# Patient Record
Sex: Female | Born: 1935 | Race: White | Hispanic: No | Smoking: Current some day smoker
Health system: Southern US, Community
[De-identification: ages and names within clinical notes are randomized; demographics above are authoritative.]

## PROBLEM LIST (undated history)

## (undated) DIAGNOSIS — C189 Malignant neoplasm of colon, unspecified: Secondary | ICD-10-CM

## (undated) DIAGNOSIS — K635 Polyp of colon: Secondary | ICD-10-CM

## (undated) DIAGNOSIS — I639 Cerebral infarction, unspecified: Secondary | ICD-10-CM

## (undated) DIAGNOSIS — M549 Dorsalgia, unspecified: Secondary | ICD-10-CM

## (undated) DIAGNOSIS — I4891 Unspecified atrial fibrillation: Secondary | ICD-10-CM

## (undated) DIAGNOSIS — I509 Heart failure, unspecified: Secondary | ICD-10-CM

## (undated) DIAGNOSIS — K7460 Unspecified cirrhosis of liver: Secondary | ICD-10-CM

## (undated) DIAGNOSIS — I10 Essential (primary) hypertension: Secondary | ICD-10-CM

## (undated) DIAGNOSIS — K56609 Unspecified intestinal obstruction, unspecified as to partial versus complete obstruction: Secondary | ICD-10-CM

## (undated) DIAGNOSIS — C801 Malignant (primary) neoplasm, unspecified: Secondary | ICD-10-CM

## (undated) DIAGNOSIS — Z720 Tobacco use: Secondary | ICD-10-CM

## (undated) DIAGNOSIS — K922 Gastrointestinal hemorrhage, unspecified: Secondary | ICD-10-CM

## (undated) DIAGNOSIS — J449 Chronic obstructive pulmonary disease, unspecified: Secondary | ICD-10-CM

## (undated) DIAGNOSIS — K5900 Constipation, unspecified: Secondary | ICD-10-CM

## (undated) HISTORY — PX: COLOSTOMY: SHX63

---

## 1978-09-27 HISTORY — PX: TOTAL ABDOMINAL HYSTERECTOMY: SHX209

## 1998-07-15 ENCOUNTER — Ambulatory Visit (HOSPITAL_COMMUNITY): Admission: RE | Admit: 1998-07-15 | Discharge: 1998-07-15 | Payer: Self-pay | Admitting: Obstetrics & Gynecology

## 1998-10-23 ENCOUNTER — Other Ambulatory Visit: Admission: RE | Admit: 1998-10-23 | Discharge: 1998-10-23 | Payer: Self-pay | Admitting: Family Medicine

## 1999-01-12 ENCOUNTER — Ambulatory Visit (HOSPITAL_COMMUNITY): Admission: RE | Admit: 1999-01-12 | Discharge: 1999-01-12 | Payer: Self-pay | Admitting: Gastroenterology

## 2000-01-27 ENCOUNTER — Other Ambulatory Visit: Admission: RE | Admit: 2000-01-27 | Discharge: 2000-01-27 | Payer: Self-pay | Admitting: Family Medicine

## 2000-05-01 ENCOUNTER — Emergency Department (HOSPITAL_COMMUNITY): Admission: EM | Admit: 2000-05-01 | Discharge: 2000-05-01 | Payer: Self-pay | Admitting: Emergency Medicine

## 2000-05-01 ENCOUNTER — Encounter: Payer: Self-pay | Admitting: Emergency Medicine

## 2001-01-23 ENCOUNTER — Encounter: Admission: RE | Admit: 2001-01-23 | Discharge: 2001-01-23 | Payer: Self-pay | Admitting: Rheumatology

## 2001-01-23 ENCOUNTER — Encounter: Payer: Self-pay | Admitting: Rheumatology

## 2001-02-02 ENCOUNTER — Encounter: Admission: RE | Admit: 2001-02-02 | Discharge: 2001-05-03 | Payer: Self-pay | Admitting: Rheumatology

## 2001-12-11 ENCOUNTER — Encounter: Payer: Self-pay | Admitting: Family Medicine

## 2001-12-11 ENCOUNTER — Encounter: Admission: RE | Admit: 2001-12-11 | Discharge: 2001-12-11 | Payer: Self-pay | Admitting: Family Medicine

## 2002-05-14 ENCOUNTER — Encounter (INDEPENDENT_AMBULATORY_CARE_PROVIDER_SITE_OTHER): Payer: Self-pay | Admitting: *Deleted

## 2002-05-14 ENCOUNTER — Ambulatory Visit (HOSPITAL_COMMUNITY): Admission: RE | Admit: 2002-05-14 | Discharge: 2002-05-14 | Payer: Self-pay | Admitting: Gastroenterology

## 2004-03-11 ENCOUNTER — Encounter (HOSPITAL_BASED_OUTPATIENT_CLINIC_OR_DEPARTMENT_OTHER): Admission: RE | Admit: 2004-03-11 | Discharge: 2004-06-09 | Payer: Self-pay | Admitting: Internal Medicine

## 2006-03-20 ENCOUNTER — Ambulatory Visit: Payer: Self-pay | Admitting: Internal Medicine

## 2006-03-20 ENCOUNTER — Inpatient Hospital Stay (HOSPITAL_COMMUNITY): Admission: EM | Admit: 2006-03-20 | Discharge: 2006-04-04 | Payer: Self-pay | Admitting: Emergency Medicine

## 2006-03-21 ENCOUNTER — Encounter (INDEPENDENT_AMBULATORY_CARE_PROVIDER_SITE_OTHER): Payer: Self-pay | Admitting: Cardiology

## 2006-03-24 ENCOUNTER — Encounter (INDEPENDENT_AMBULATORY_CARE_PROVIDER_SITE_OTHER): Payer: Self-pay | Admitting: Specialist

## 2006-04-14 ENCOUNTER — Ambulatory Visit: Payer: Self-pay | Admitting: Internal Medicine

## 2006-05-04 ENCOUNTER — Inpatient Hospital Stay (HOSPITAL_COMMUNITY): Admission: EM | Admit: 2006-05-04 | Discharge: 2006-05-05 | Payer: Self-pay | Admitting: Emergency Medicine

## 2006-05-04 ENCOUNTER — Ambulatory Visit: Payer: Self-pay | Admitting: Internal Medicine

## 2006-07-15 ENCOUNTER — Encounter: Admission: RE | Admit: 2006-07-15 | Discharge: 2006-07-15 | Payer: Self-pay | Admitting: Family Medicine

## 2006-09-27 DIAGNOSIS — I639 Cerebral infarction, unspecified: Secondary | ICD-10-CM

## 2006-09-27 HISTORY — DX: Cerebral infarction, unspecified: I63.9

## 2006-10-10 ENCOUNTER — Inpatient Hospital Stay (HOSPITAL_COMMUNITY): Admission: EM | Admit: 2006-10-10 | Discharge: 2006-10-13 | Payer: Self-pay | Admitting: Emergency Medicine

## 2006-10-11 ENCOUNTER — Encounter (INDEPENDENT_AMBULATORY_CARE_PROVIDER_SITE_OTHER): Payer: Self-pay | Admitting: Cardiology

## 2006-10-11 ENCOUNTER — Encounter (INDEPENDENT_AMBULATORY_CARE_PROVIDER_SITE_OTHER): Payer: Self-pay | Admitting: Neurology

## 2006-10-12 ENCOUNTER — Ambulatory Visit: Payer: Self-pay | Admitting: Physical Medicine & Rehabilitation

## 2006-10-13 ENCOUNTER — Inpatient Hospital Stay (HOSPITAL_COMMUNITY): Admission: EM | Admit: 2006-10-13 | Discharge: 2006-10-22 | Payer: Self-pay | Admitting: Emergency Medicine

## 2006-11-14 ENCOUNTER — Telehealth (INDEPENDENT_AMBULATORY_CARE_PROVIDER_SITE_OTHER): Payer: Self-pay | Admitting: Hospitalist

## 2007-01-10 ENCOUNTER — Ambulatory Visit: Payer: Self-pay | Admitting: Vascular Surgery

## 2007-01-10 ENCOUNTER — Encounter (INDEPENDENT_AMBULATORY_CARE_PROVIDER_SITE_OTHER): Payer: Self-pay | Admitting: *Deleted

## 2007-01-10 ENCOUNTER — Ambulatory Visit (HOSPITAL_COMMUNITY): Admission: RE | Admit: 2007-01-10 | Discharge: 2007-01-10 | Payer: Self-pay | Admitting: Family Medicine

## 2007-04-11 ENCOUNTER — Ambulatory Visit (HOSPITAL_COMMUNITY): Admission: RE | Admit: 2007-04-11 | Discharge: 2007-04-11 | Payer: Self-pay | Admitting: Family Medicine

## 2007-06-12 ENCOUNTER — Inpatient Hospital Stay (HOSPITAL_COMMUNITY): Admission: EM | Admit: 2007-06-12 | Discharge: 2007-06-16 | Payer: Self-pay | Admitting: Emergency Medicine

## 2007-06-13 ENCOUNTER — Ambulatory Visit: Payer: Self-pay | Admitting: Surgery

## 2007-06-13 ENCOUNTER — Encounter (INDEPENDENT_AMBULATORY_CARE_PROVIDER_SITE_OTHER): Payer: Self-pay | Admitting: Emergency Medicine

## 2007-06-15 ENCOUNTER — Ambulatory Visit: Payer: Self-pay | Admitting: Physical Medicine & Rehabilitation

## 2007-06-16 ENCOUNTER — Inpatient Hospital Stay (HOSPITAL_COMMUNITY)
Admission: RE | Admit: 2007-06-16 | Discharge: 2007-07-05 | Payer: Self-pay | Admitting: Physical Medicine & Rehabilitation

## 2007-06-16 ENCOUNTER — Ambulatory Visit: Payer: Self-pay | Admitting: Physical Medicine & Rehabilitation

## 2007-08-15 ENCOUNTER — Encounter
Admission: RE | Admit: 2007-08-15 | Discharge: 2007-08-16 | Payer: Self-pay | Admitting: Physical Medicine & Rehabilitation

## 2007-10-04 ENCOUNTER — Inpatient Hospital Stay (HOSPITAL_COMMUNITY): Admission: EM | Admit: 2007-10-04 | Discharge: 2007-10-10 | Payer: Self-pay | Admitting: Emergency Medicine

## 2007-10-06 ENCOUNTER — Ambulatory Visit: Payer: Self-pay | Admitting: Physical Medicine & Rehabilitation

## 2007-12-16 ENCOUNTER — Emergency Department (HOSPITAL_COMMUNITY): Admission: EM | Admit: 2007-12-16 | Discharge: 2007-12-17 | Payer: Self-pay | Admitting: Emergency Medicine

## 2008-03-01 ENCOUNTER — Encounter
Admission: RE | Admit: 2008-03-01 | Discharge: 2008-03-01 | Payer: Self-pay | Admitting: Physical Medicine & Rehabilitation

## 2008-03-22 ENCOUNTER — Emergency Department (HOSPITAL_COMMUNITY): Admission: EM | Admit: 2008-03-22 | Discharge: 2008-03-22 | Payer: Self-pay | Admitting: Emergency Medicine

## 2008-04-08 ENCOUNTER — Encounter
Admission: RE | Admit: 2008-04-08 | Discharge: 2008-04-08 | Payer: Self-pay | Admitting: Physical Medicine & Rehabilitation

## 2008-04-08 ENCOUNTER — Ambulatory Visit: Payer: Self-pay | Admitting: Physical Medicine & Rehabilitation

## 2008-07-04 ENCOUNTER — Encounter
Admission: RE | Admit: 2008-07-04 | Discharge: 2008-07-05 | Payer: Self-pay | Admitting: Physical Medicine & Rehabilitation

## 2008-07-05 ENCOUNTER — Ambulatory Visit: Payer: Self-pay | Admitting: Physical Medicine & Rehabilitation

## 2008-10-23 ENCOUNTER — Encounter
Admission: RE | Admit: 2008-10-23 | Discharge: 2008-10-25 | Payer: Self-pay | Admitting: Physical Medicine & Rehabilitation

## 2008-10-25 ENCOUNTER — Ambulatory Visit: Payer: Self-pay | Admitting: Physical Medicine & Rehabilitation

## 2008-11-29 ENCOUNTER — Encounter
Admission: RE | Admit: 2008-11-29 | Discharge: 2009-02-27 | Payer: Self-pay | Admitting: Physical Medicine & Rehabilitation

## 2008-12-02 ENCOUNTER — Ambulatory Visit: Payer: Self-pay | Admitting: Physical Medicine & Rehabilitation

## 2008-12-31 ENCOUNTER — Ambulatory Visit: Payer: Self-pay | Admitting: Physical Medicine & Rehabilitation

## 2009-01-30 ENCOUNTER — Ambulatory Visit: Payer: Self-pay | Admitting: Physical Medicine & Rehabilitation

## 2009-02-26 ENCOUNTER — Encounter
Admission: RE | Admit: 2009-02-26 | Discharge: 2009-05-27 | Payer: Self-pay | Admitting: Physical Medicine & Rehabilitation

## 2009-02-28 ENCOUNTER — Ambulatory Visit: Payer: Self-pay | Admitting: Physical Medicine & Rehabilitation

## 2009-03-25 ENCOUNTER — Ambulatory Visit: Payer: Self-pay | Admitting: Physical Medicine & Rehabilitation

## 2009-09-27 DIAGNOSIS — K922 Gastrointestinal hemorrhage, unspecified: Secondary | ICD-10-CM

## 2009-09-27 HISTORY — DX: Gastrointestinal hemorrhage, unspecified: K92.2

## 2009-11-18 ENCOUNTER — Ambulatory Visit: Payer: Self-pay | Admitting: Internal Medicine

## 2009-11-18 ENCOUNTER — Inpatient Hospital Stay (HOSPITAL_COMMUNITY): Admission: EM | Admit: 2009-11-18 | Discharge: 2009-11-20 | Payer: Self-pay | Admitting: Emergency Medicine

## 2009-11-19 ENCOUNTER — Encounter: Payer: Self-pay | Admitting: Internal Medicine

## 2010-02-07 ENCOUNTER — Inpatient Hospital Stay (HOSPITAL_COMMUNITY): Admission: EM | Admit: 2010-02-07 | Discharge: 2010-02-11 | Payer: Self-pay | Admitting: Emergency Medicine

## 2010-04-04 ENCOUNTER — Inpatient Hospital Stay (HOSPITAL_COMMUNITY): Admission: EM | Admit: 2010-04-04 | Discharge: 2010-04-08 | Payer: Self-pay | Admitting: Emergency Medicine

## 2010-06-11 ENCOUNTER — Ambulatory Visit: Payer: Self-pay | Admitting: Internal Medicine

## 2010-06-11 ENCOUNTER — Encounter (INDEPENDENT_AMBULATORY_CARE_PROVIDER_SITE_OTHER): Payer: Self-pay | Admitting: Internal Medicine

## 2010-06-11 ENCOUNTER — Inpatient Hospital Stay (HOSPITAL_COMMUNITY): Admission: EM | Admit: 2010-06-11 | Discharge: 2010-06-16 | Payer: Self-pay | Admitting: Emergency Medicine

## 2010-06-11 ENCOUNTER — Ambulatory Visit: Payer: Self-pay | Admitting: Vascular Surgery

## 2010-06-12 ENCOUNTER — Encounter (INDEPENDENT_AMBULATORY_CARE_PROVIDER_SITE_OTHER): Payer: Self-pay | Admitting: Internal Medicine

## 2010-10-17 ENCOUNTER — Encounter: Payer: Self-pay | Admitting: Family Medicine

## 2010-10-18 ENCOUNTER — Encounter: Payer: Self-pay | Admitting: Cardiology

## 2010-10-19 ENCOUNTER — Encounter: Payer: Self-pay | Admitting: Gastroenterology

## 2010-11-02 ENCOUNTER — Emergency Department (HOSPITAL_COMMUNITY): Payer: MEDICARE

## 2010-11-02 ENCOUNTER — Inpatient Hospital Stay (HOSPITAL_COMMUNITY)
Admission: EM | Admit: 2010-11-02 | Discharge: 2010-11-09 | DRG: 190 | Disposition: A | Discharge status: Home Health | Payer: MEDICARE | Attending: Internal Medicine | Admitting: Internal Medicine

## 2010-11-02 DIAGNOSIS — A498 Other bacterial infections of unspecified site: Secondary | ICD-10-CM | POA: Diagnosis present

## 2010-11-02 DIAGNOSIS — R5381 Other malaise: Secondary | ICD-10-CM | POA: Diagnosis present

## 2010-11-02 DIAGNOSIS — G9349 Other encephalopathy: Secondary | ICD-10-CM | POA: Diagnosis not present

## 2010-11-02 DIAGNOSIS — Z9981 Dependence on supplemental oxygen: Secondary | ICD-10-CM

## 2010-11-02 DIAGNOSIS — N898 Other specified noninflammatory disorders of vagina: Secondary | ICD-10-CM | POA: Diagnosis present

## 2010-11-02 DIAGNOSIS — J441 Chronic obstructive pulmonary disease with (acute) exacerbation: Principal | ICD-10-CM | POA: Diagnosis present

## 2010-11-02 DIAGNOSIS — Z79899 Other long term (current) drug therapy: Secondary | ICD-10-CM

## 2010-11-02 DIAGNOSIS — I69959 Hemiplegia and hemiparesis following unspecified cerebrovascular disease affecting unspecified side: Secondary | ICD-10-CM

## 2010-11-02 DIAGNOSIS — J9620 Acute and chronic respiratory failure, unspecified whether with hypoxia or hypercapnia: Secondary | ICD-10-CM | POA: Diagnosis not present

## 2010-11-02 DIAGNOSIS — I5043 Acute on chronic combined systolic (congestive) and diastolic (congestive) heart failure: Secondary | ICD-10-CM | POA: Diagnosis present

## 2010-11-02 DIAGNOSIS — IMO0001 Reserved for inherently not codable concepts without codable children: Secondary | ICD-10-CM | POA: Diagnosis present

## 2010-11-02 DIAGNOSIS — R259 Unspecified abnormal involuntary movements: Secondary | ICD-10-CM | POA: Diagnosis present

## 2010-11-02 DIAGNOSIS — N390 Urinary tract infection, site not specified: Secondary | ICD-10-CM | POA: Diagnosis present

## 2010-11-02 DIAGNOSIS — I509 Heart failure, unspecified: Secondary | ICD-10-CM | POA: Diagnosis present

## 2010-11-02 DIAGNOSIS — F17200 Nicotine dependence, unspecified, uncomplicated: Secondary | ICD-10-CM | POA: Diagnosis present

## 2010-11-02 HISTORY — DX: Chronic obstructive pulmonary disease, unspecified: J44.9

## 2010-11-02 LAB — URINALYSIS, ROUTINE W REFLEX MICROSCOPIC
Nitrite: NEGATIVE
Specific Gravity, Urine: >1.046 — ABNORMAL HIGH (ref 1.005–1.030)
Urobilinogen, UA: 0.2 mg/dL (ref 0.0–1.0)

## 2010-11-02 LAB — DIFFERENTIAL
Eosinophils Relative: 1 % (ref 0–5)
Lymphocytes Relative: 25 % (ref 12–46)
Lymphs Abs: 2.8 10*3/uL (ref 0.7–4.0)
Monocytes Absolute: 0.5 10*3/uL (ref 0.1–1.0)
Monocytes Relative: 5 % (ref 3–12)

## 2010-11-02 LAB — URINE MICROSCOPIC-ADD ON

## 2010-11-02 LAB — CBC
HCT: 39.6 % (ref 36.0–46.0)
MCH: 28.3 pg (ref 26.0–34.0)
MCHC: 30.8 g/dL (ref 30.0–36.0)
MCV: 91.9 fL (ref 78.0–100.0)
RDW: 14.7 % (ref 11.5–15.5)

## 2010-11-02 LAB — BASIC METABOLIC PANEL
CO2: 33 mEq/L — ABNORMAL HIGH (ref 19–32)
Calcium: 8.7 mg/dL (ref 8.4–10.5)
Creatinine, Ser: 0.91 mg/dL (ref 0.4–1.2)
Glucose, Bld: 198 mg/dL — ABNORMAL HIGH (ref 70–99)

## 2010-11-02 LAB — POCT CARDIAC MARKERS: CKMB, poc: <1 ng/mL — ABNORMAL LOW (ref 1.0–8.0)

## 2010-11-02 MED ORDER — IOHEXOL 300 MG/ML  SOLN
100.0000 mL | Freq: Once | INTRAMUSCULAR | Status: AC | PRN
  Administered 2010-11-02: 100 mL via INTRAVENOUS

## 2010-11-03 LAB — GLUCOSE, CAPILLARY
Glucose-Capillary: 385 mg/dL — ABNORMAL HIGH (ref 70–99)
Glucose-Capillary: 288 mg/dL — ABNORMAL HIGH (ref 70–99)

## 2010-11-03 LAB — DIFFERENTIAL
Basophils Absolute: 0 10*3/uL (ref 0.0–0.1)
Basophils Relative: 0 % (ref 0–1)
Eosinophils Relative: 0 % (ref 0–5)
Monocytes Absolute: 0.1 10*3/uL (ref 0.1–1.0)
Neutro Abs: 8.8 10*3/uL — ABNORMAL HIGH (ref 1.7–7.7)

## 2010-11-03 LAB — CARDIAC PANEL(CRET KIN+CKTOT+MB+TROPI)
Relative Index: INVALID (ref 0.0–2.5)
Relative Index: INVALID (ref 0.0–2.5)
Total CK: 45 U/L (ref 7–177)
Troponin I: 0.02 ng/mL (ref 0.00–0.06)

## 2010-11-03 LAB — CBC
MCHC: 30.9 g/dL (ref 30.0–36.0)
Platelets: 323 10*3/uL (ref 150–400)
RDW: 14.6 % (ref 11.5–15.5)

## 2010-11-03 LAB — COMPREHENSIVE METABOLIC PANEL
CO2: 31 mEq/L (ref 19–32)
Calcium: 8.5 mg/dL (ref 8.4–10.5)
Creatinine, Ser: 0.98 mg/dL (ref 0.4–1.2)
GFR calc non Af Amer: 55 mL/min — ABNORMAL LOW (ref >60)
Glucose, Bld: 322 mg/dL — ABNORMAL HIGH (ref 70–99)

## 2010-11-03 LAB — BRAIN NATRIURETIC PEPTIDE: Pro B Natriuretic peptide (BNP): 211 pg/mL — ABNORMAL HIGH (ref 0.0–100.0)

## 2010-11-03 LAB — MAGNESIUM: Magnesium: 1.8 mg/dL (ref 1.5–2.5)

## 2010-11-04 LAB — GLUCOSE, CAPILLARY
Glucose-Capillary: 196 mg/dL — ABNORMAL HIGH (ref 70–99)
Glucose-Capillary: 79 mg/dL (ref 70–99)

## 2010-11-04 LAB — URINE CULTURE: Colony Count: 75000

## 2010-11-05 LAB — GLUCOSE, CAPILLARY
Glucose-Capillary: 142 mg/dL — ABNORMAL HIGH (ref 70–99)
Glucose-Capillary: 156 mg/dL — ABNORMAL HIGH (ref 70–99)

## 2010-11-05 LAB — CBC
HCT: 35 % — ABNORMAL LOW (ref 36.0–46.0)
Hemoglobin: 10.9 g/dL — ABNORMAL LOW (ref 12.0–15.0)
WBC: 13.7 10*3/uL — ABNORMAL HIGH (ref 4.0–10.5)

## 2010-11-06 ENCOUNTER — Inpatient Hospital Stay (HOSPITAL_COMMUNITY): Payer: MEDICARE

## 2010-11-06 ENCOUNTER — Encounter (HOSPITAL_COMMUNITY): Payer: Self-pay | Admitting: Radiology

## 2010-11-06 LAB — BLOOD GAS, ARTERIAL
Acid-Base Excess: 0.7 mmol/L (ref 0.0–2.0)
Bicarbonate: 26.4 mEq/L — ABNORMAL HIGH (ref 20.0–24.0)
Drawn by: 30996
FIO2: 0.3 %
O2 Content: 3 L/min
Patient temperature: 98.6
TCO2: 24.5 mmol/L (ref 0–100)
pCO2 arterial: 64.8 mmHg (ref 35.0–45.0)
pH, Arterial: 7.406 — ABNORMAL HIGH (ref 7.350–7.400)
pO2, Arterial: 87.4 mmHg (ref 80.0–100.0)
pO2, Arterial: 85.2 mmHg (ref 80.0–100.0)

## 2010-11-06 LAB — URINALYSIS, ROUTINE W REFLEX MICROSCOPIC
Nitrite: NEGATIVE
Specific Gravity, Urine: 1.019 (ref 1.005–1.030)
Urobilinogen, UA: 0.2 mg/dL (ref 0.0–1.0)

## 2010-11-06 LAB — GLUCOSE, CAPILLARY: Glucose-Capillary: 182 mg/dL — ABNORMAL HIGH (ref 70–99)

## 2010-11-06 LAB — MRSA PCR SCREENING: MRSA by PCR: NEGATIVE

## 2010-11-06 LAB — CBC
HCT: 33.9 % — ABNORMAL LOW (ref 36.0–46.0)
MCHC: 30.7 g/dL (ref 30.0–36.0)
RBC: 3.7 MIL/uL — ABNORMAL LOW (ref 3.87–5.11)
WBC: 15.4 10*3/uL — ABNORMAL HIGH (ref 4.0–10.5)

## 2010-11-06 LAB — URINE MICROSCOPIC-ADD ON

## 2010-11-06 LAB — BASIC METABOLIC PANEL
CO2: 30 mEq/L (ref 19–32)
Calcium: 8.2 mg/dL — ABNORMAL LOW (ref 8.4–10.5)
Glucose, Bld: 153 mg/dL — ABNORMAL HIGH (ref 70–99)
Sodium: 136 mEq/L (ref 135–145)

## 2010-11-07 ENCOUNTER — Inpatient Hospital Stay (HOSPITAL_COMMUNITY): Payer: MEDICARE

## 2010-11-07 LAB — BASIC METABOLIC PANEL
CO2: 28 mEq/L (ref 19–32)
Calcium: 8.6 mg/dL (ref 8.4–10.5)
Creatinine, Ser: 0.88 mg/dL (ref 0.4–1.2)
GFR calc Af Amer: >60 mL/min (ref >60)
GFR calc non Af Amer: >60 mL/min (ref >60)
Sodium: 136 mEq/L (ref 135–145)

## 2010-11-07 LAB — CBC
Hemoglobin: 10.5 g/dL — ABNORMAL LOW (ref 12.0–15.0)
MCHC: 30.9 g/dL (ref 30.0–36.0)
Platelets: 340 10*3/uL (ref 150–400)
RDW: 15.2 % (ref 11.5–15.5)

## 2010-11-07 LAB — GLUCOSE, CAPILLARY
Glucose-Capillary: 154 mg/dL — ABNORMAL HIGH (ref 70–99)
Glucose-Capillary: 129 mg/dL — ABNORMAL HIGH (ref 70–99)

## 2010-11-07 LAB — BRAIN NATRIURETIC PEPTIDE: Pro B Natriuretic peptide (BNP): 146 pg/mL — ABNORMAL HIGH (ref 0.0–100.0)

## 2010-11-08 LAB — CBC
MCH: 27.8 pg (ref 26.0–34.0)
Platelets: 343 10*3/uL (ref 150–400)
RBC: 3.63 MIL/uL — ABNORMAL LOW (ref 3.87–5.11)
RDW: 15.2 % (ref 11.5–15.5)
WBC: 13.8 10*3/uL — ABNORMAL HIGH (ref 4.0–10.5)

## 2010-11-08 LAB — BLOOD GAS, ARTERIAL
Bicarbonate: 28.6 mEq/L — ABNORMAL HIGH (ref 20.0–24.0)
Drawn by: 308601
O2 Content: 2 L/min
pCO2 arterial: 47.1 mmHg — ABNORMAL HIGH (ref 35.0–45.0)
pH, Arterial: 7.4 (ref 7.350–7.400)
pO2, Arterial: 59.9 mmHg — ABNORMAL LOW (ref 80.0–100.0)

## 2010-11-08 LAB — BASIC METABOLIC PANEL
Calcium: 8.7 mg/dL (ref 8.4–10.5)
GFR calc non Af Amer: 54 mL/min — ABNORMAL LOW (ref >60)
Glucose, Bld: 107 mg/dL — ABNORMAL HIGH (ref 70–99)
Potassium: 4.5 mEq/L (ref 3.5–5.1)
Sodium: 140 mEq/L (ref 135–145)

## 2010-11-08 LAB — GLUCOSE, CAPILLARY
Glucose-Capillary: 119 mg/dL — ABNORMAL HIGH (ref 70–99)
Glucose-Capillary: 173 mg/dL — ABNORMAL HIGH (ref 70–99)

## 2010-11-09 LAB — CULTURE, BLOOD (ROUTINE X 2): Culture: NO GROWTH

## 2010-11-09 LAB — URINE CULTURE
Colony Count: >=100000
Culture  Setup Time: 201202110210

## 2010-11-09 LAB — GLUCOSE, CAPILLARY
Glucose-Capillary: 148 mg/dL — ABNORMAL HIGH (ref 70–99)
Glucose-Capillary: 161 mg/dL — ABNORMAL HIGH (ref 70–99)

## 2010-11-09 LAB — BLOOD GAS, ARTERIAL
Drawn by: 310571
Expiratory PAP: 5
Inspiratory PAP: 10
pCO2 arterial: 55.5 mmHg — ABNORMAL HIGH (ref 35.0–45.0)
pO2, Arterial: 82.4 mmHg (ref 80.0–100.0)

## 2010-11-09 NOTE — H&P (Signed)
NAME:  [name], [name] NO.:  [id number]  MEDICAL RECORD NO.:  [id number]           PATIENT TYPE:  E  LOCATION:  WLED                         FACILITY:  [facility]  PHYSICIAN:  Talmage Nap, MD  DATE OF BIRTH:  21-Mar-1930  DATE OF ADMISSION:  11/02/2010 DATE OF DISCHARGE:                             HISTORY & PHYSICAL   PRIMARY CARE PHYSICIAN:  Windle Guard, MD, Family Practice.  History obtained from the patient and the patient's daughter.  CHIEF COMPLAINT:  Congestion in the lung of 5 days' duration and shortness of breath and cough of 2 days' duration.  HISTORY:  The patient is a 77 year old Caucasian female with a history of CVA with right hemiparesis, hypertension, and diabetes mellitus, presenting to the emergency room with congestion in the lungs of about 5 days' duration and this was said to be getting progressively worse.  Two days prior to presenting to the emergency room, the patient was said to have developed shortness of breath with cough that was nonproductive of sputum.  She, however, denied any associated chest pain.  She denied any nausea or vomiting.  No fever, no chills, no rigor, but the congestion and the cough persisted, hence the patient was brought to the emergency room by her daughter to be evaluated.  PAST MEDICAL HISTORY: 1. CVA with a right hemiparesis. 2. Hypertension. 3. Diabetes mellitus. 4. Anxiety disorder.  PAST SURGICAL HISTORY: 1. Hysterectomy. 2. Tonsillectomy.  MEDICATIONS:  Her preadmission medications most recent include 1. Macrocrystal/nitrofurantoin 100 mg 1 p.o. b.i.d. 2. Celexa 20 mg p.o. at bedtime. 3. Protonix 40 mg 1 p.o. daily. 4. Plavix 75 mg 1 p.o. daily. 5. Oxycodone 50 mg 1 p.o. b.i.d. 6. Lasix 40 mg 1 p.o. daily. 7. Diazepam 10 mg 1 p.o. b.i.d. p.r.n.  ALLERGIES:  AGGRENOX.  SOCIAL HISTORY:  Negative for alcohol use.  The patient smokes about 3 sticks of cigarettes per day for the past 60  years.  Lives with her daughter and ambulates with the help of her walker and assistant.  FAMILY HISTORY:  Positive for COPD.  REVIEW OF SYSTEMS:  The patient denies any history of headaches.  No blurred vision.  No nausea or vomiting.  Complained of congestion in the lung with shortness of breath and cough that is nonproductive of sputum. Denies any associated chest pain.  No PND or orthopnea.  No fever.  No chills.  No rigor.  No abdominal discomfort.  No diarrhea or hematochezia.  No dysuria or hematuria.  Periodic slight swelling of the lower extremities.  No intolerance to heat or cold.  No known psychiatric disorder.  PHYSICAL EXAMINATION:  GENERAL:  On examination, elderly lady not in any obvious respiratory distress. PRESENT VITAL SIGNS:  Blood pressure is 99/62, pulse is 146, respiratory rate is 20, temperature is 99.1. HEENT:  Pallor, but pupils are reactive to light and extraocular muscles are intact. NECK:  No jugular venous distention.  No carotid bruit.  No lymphadenopathy. CHEST:  Rales with minimally scattered rhonchi all over the lung fields. HEART:  Heart sounds, S1 and S2. ABDOMEN:  Soft, nontender.  Liver, spleen,  kidney not palpable.  Bowel sounds are positive. EXTREMITIES:  Plantar flexion of the feet with trace edema. NEUROLOGIC:  Right hemiparesis. NEUROPSYCHIATRIC:  The patient to be slightly cognitively impaired. SKIN:  No ecchymosis. MUSCULOSKELETAL SYSTEM:  Arthritic changes in the knees and feet.  LABORATORY DATA:  Initial urinalysis done on the patient showed WBC too numerous to count, bacteria few.  Urine microscopy showed no real leukocyte esterase.  Chemistry shows sodium of 139, potassium of 3.4, chloride of 95 with a bicarb of 33, glucose is 198, BUN is 80, creatinine 0.91.  Hematology indices showed WBC of 10.9, hemoglobin of 12.2, hematocrit of 39.6, MCV of 91.9 with a platelet count of 370 with normal differential.  First set of cardiac  marker troponin-I less than 0.05.  Imaging studies done on the patient include CT angiogram with contrast, which was negative for any acute pulmonary embolism and a chest x-ray showed a cardiomegaly with a compression fracture of L1.  EKG showed a sinus rhythm with PVCs with a rate of 97 and a right bundle-branch block.  ADMITTING IMPRESSION: 1. Chronic obstructive pulmonary disease exacerbation. 2. Questionable Cor pulmonale. 3. Cerebrovascular accident with right hemiparesis. 4. Hypertension (blood pressure is borderline now). 5. Diabetes mellitus. 6. Urinary tract infection. 7. Mild cognitive impairment. 8. Compression fracture of L1 (incidentaloma).  PLAN:  To admit the patient to telemetry.  The patient will be on O2 via nasal cannula at 3 L/min.  She will be given breathing treatment with albuterol and Atrovent nebs q.4 h. and she will also be on Levaquin 500 mg IV q.24 h.  The patient will be given Lasix 40 mg IV q.24 h. and KCl 10 mEq p.o. b.i.d.  Other medication to be given to the patient will include Plavix 75 mg p.o. daily.  Accu-Cheks t.i.d. with a.c. and h.s. with Regular insulin sliding scale (moderate scale).  All antihypertensive medications will be on hold for now because of borderline blood pressure.  The patient will also be on Celexa 20 mg p.o. at bedtime, Protonix 40 mg IV q.24 h. for GI prophylaxis, and Lovenox 40 mg subcutaneous q.24 h. for DVT prophylaxis.  Further labs to be ordered on this patient will include cardiac enzymes q.6 h. x3, BNP stat, blood culture x2, and hemoglobin A1c.  A 2-D echo will also be done.  CBCD, CMP and magnesium will be repeated in a.m.  The patient will be followed and evaluated on day-to-day basis.     Talmage Nap, MD     CN/MEDQ  D:  11/02/2010  T:  11/02/2010  Job:  [id number]  Electronically Signed by Talmage Nap  on 11/03/2010 02:53:28 AM

## 2010-11-22 NOTE — Discharge Summary (Signed)
NAME:  [name], [name]               ACCOUNT NO.:  [id number]  MEDICAL RECORD NO.:  [id number]           PATIENT TYPE:  I  LOCATION:  1530                         FACILITY:  [facility]  PHYSICIAN:  Marcellus Scott, MD     DATE OF BIRTH:  Oct 10, 1929  DATE OF ADMISSION:  11/02/2010 DATE OF DISCHARGE:  11/09/2010                              DISCHARGE SUMMARY   PRIMARY CARE PHYSICIAN:  Windle Guard, M.D.  DISCHARGE DIAGNOSES: 1. Acute exacerbation of chronic obstructive pulmonary disease. 2. Acute-on-chronic combined congestive heart failure. 3. Uncontrolled type 2 diabetes mellitus. 4. Hypertension. 5. Cerebrovascular accident with right hemiparesis. 6. Tremors. 7. E-coli urinary tract infection. 8. Vaginal bleeding, resolved, outpatient evaluation. 9. Encephalopathy secondary to excess benzodiazepine and acute-on-     chronic hypoxic and hypercapnic respiratory failure. 10.Chronic benzodiazepine use. 11.Acute-on-chronic hypoxic and hypercapnic respiratory failure.     Acute respiratory resolved.  Oxygen dependent.  DISCHARGE MEDICATIONS: 1. Albuterol H F A 2 puffs inhaled q. four hourly p.r.n. for     difficulty breathing or wheezing. 2. Combivent 2 puffs inhaled q.i.d. 3. Diazepam reduced to 5 mg p.o. q.h.s. 4. Glipizide XL 5 mg p.o. daily. 5. Lisinopril 10 mg p.o. daily. 6. Oxycodone IR 5 mg p.o. q. six hourly p.r.n. pain. 7. Oxygen via nasal cannula use 2 liters per minute at rest or sleep     and 3 liters per minute with activity, continuously. 8. Potassium chloride 20 mEq p.o. daily. 9. Celexa 20 mg p.o. q.h.s. 10.Furosemide 40 mg p.o. daily. 11.Macrobid 100 mg p.o. b.i.d. for 1 week. 12.Plavix 75 mg p.o. daily. 13.Protonix 40 mg p.o. daily.  DISCONTINUED MEDICATION: 1. Oxycodone 15 mg. 2. Diazepam 10 mg.  PROCEDURES: 1. Chest x-ray on February 11.  IMPRESSION:  Cardiomegaly with low     lung volumes, bibasilar opacities, and increasing pulmonary     vascular  congestion. 2. CT of the head without contrast on February 10.  IMPRESSION:  A.     Negative for bleed or other acute intracranial processes.  B.     Atrophy and nonspecific white matter changes.  C.  Right maxillary     sinus disease. 3. CT angiogram of the chest with contrast on the November 02, 2010.     IMPRESSION:  No evidence for acute pulmonary embolism up to and     including the third order pulmonary arteries.  Mild central     bronchial wall thickening could signify chronic or acute     bronchitis.  Dependent bibasilar atelectasis.  Extensive severe     atherosclerotic disease. 4. Chest x-ray on February 6.  IMPRESSION:  A.  Shallow lung     inflation.  B.  Suspect cardiomegaly.  C.  L1 compression fracture     of indeterminate age. 5. 2-D echocardiogram on February 7.  CONCLUSION:  Left ventricle     cavity size was normal.  There was mild concentric hypertrophy.     Systolic function was mild to moderately reduced.  Estimated     ejection fraction was in the range of 40-45%.  Doppler parameters  are consistent with abnormal left ventricular relaxation which is     grade 1 diastolic function.  PERTINENT LABS:  Blood cultures x2 on the February 6 and February 7 are no growth, final report.  Urine culture from February 10 shows greater than 100,000 colonies per mL of E-coli which is sensitive to ceftriaxone and nitrofurantoin and Bactrim but resistant to ampicillin, cefazolin, ciprofloxacin, gentamicin, levofloxacin, and intermediate sensitivity to tobramycin.  ABG on February 12 on 2 liters of oxygen via nasal cannula showed pH 7.4, pCO2 47, pO2 60, bicarbonate 29, and oxygen saturation of 93%.  CBC shows hemoglobin 10.1, hematocrit 33, white blood cell 14, platelets 343,000.  Basic metabolic panel only remarkable for glucose 107.  BUN was 25, creatinine was 0.99.  BNP was 146.  Urinalysis on February 10 showed 11-20 white blood cells and few bacteria.  Urine culture on  February 6 showed greater than 75,000 colonies per mL of Proteus mirabilis.  Hemoglobin A1c was 7.7.  Hepatic panel on February 7 within normal limits except albumin 2.5, magnesium was 1.8.  Cardiac enzymes were cycled and negative.  Urinalysis on admission had shown too numerous to count white blood cells and a few bacteria.  D-dimer on admission was 0.79.  CONSULTATIONS:  None.  DIET:  Diabetic diet.  ACTIVITIES:  Out of bed with assist and increase activity slowly.  The patient will have Home Health Physical Therapy and Occupational Therapy evaluation.  TODAY'S COMPLAINTS:  None.  She denies dyspnea or chest pain.  According to her daughter who is at the bedside, her mental status and physical status is back to its baseline.  PHYSICAL EXAMINATION:  GENERAL:  The patient is in no obvious distress. VITAL SIGNS:  Temperature 99.5 degrees Fahrenheit, pulse 69 per minute regular, respiration 18 per minute, blood pressure 125/81 mmHg, and saturating at 95% on 2 liters of oxygen. RESPIRATORY SYSTEM:  Slightly decreased breath sounds in the bases with few basal crackles.  No increased work of breathing.  Rest of the lung fields are clear. CARDIOVASCULAR SYSTEM:  First and second heart sounds heard.  Regular rate and rhythm.  No JVD. ABDOMEN:  Soft and bowel sounds present. CENTRAL NERVOUS SYSTEM:  Patient is awake, alert, oriented x3 with no cranial nerve deficits. EXTREMITIES:  With old right hemiparesis.  Grade 5/5 in the left limbs and grade 3/5 power in the right limbs.  HOSPITAL COURSE:  Ms. [name] is a 77 year old Caucasian female patient with history of CVA and residual right hemiparesis, hypertension, type 2 diabetes mellitus, who has been off her oral hypoglycemic agents for approximately a year, pain issues, who presented to the Emergency Room with congestion in the lungs of 5 days' duration which was getting worse.  She was brought to the hospital for further evaluation  and management. 1. Acute exacerbation of COPD.  The patient was admitted to the     hospital.  This was probably a mild exacerbation.  She was started     on empiric Levaquin, nebulizations, and there was no need for     steroids.  This is now at baseline.  She indicates that she does     not have a nebulizer or inhalers at home and these will be     prescribed. 2. Acute-on-chronic combined heart failure.  She ruled out for an MI.     Low-dose ACE inhibitors were added.  She is on diuretics.  It is     not clear if the patient is  truly compliant with her medications at     home because she has stopped taking some of the medications that     she was to be on.  This has to be addressed when she sees her     primary care physician.  This is currently compensated.  Consider     Cardiology evaluation as an outpatient as deemed necessary. 3. Uncontrolled type 2 diabetes mellitus.  Although, the patient was     placed on Lantus and sliding-scale insulin in the hospital with     fairly good control ranging currently in the 134-177 mg/dL, the     daughter does not wish for her mom to continue on the insulin and     would like her to be switched back to the glipizide XL that she     used to take before.  She has been educated to check her CBGs    frequently and follow up with her primary care physician. 4. Uncontrolled hypertension.  This is improved on the current regimen     and with a blood pressures ranging in the 120 over 60s to 80s. 5. Urinary tract infection.  Initially, she had culture which grew     Proteus which should have been addressed by the Levaquin but     subsequently when she had altered mental status, urine culture were     sent which is showing E-coli.  Will complete a week's worth of     Macrobid.  It is possible that the patient may have colonization of     her urinary tract with multiple organisms and may choose not to     treat her in the future unless she has a fever or  leukocytosis or     is truly septic. 6. Encephalopathy.  On Friday of last week, the patient was found to     be somnolent.  Her daughter indicated that the patient took 10 mg     of Valium every night.  For the first 4 days of the     hospitalization, she was not on any Valium however on the early     morning of the of February 10, she received Valium.  Subsequently,     she was extremely somnolent.  CT of the head was negative for acute     findings.  Her ABG shows hypercapnic and hypoxic respiratory     failure.  The patient was transferred to the Step-down Unit.  She     was placed on BiPAP.  Her benzodiazepine doses were reduced.  We     did not want to stop the Valium completely to avoid the risk of     benzodiazepine withdrawal seizures.  With these measures, the     patient has stabilized.  Her encephalopathy has resolved. 7. Acute-on-chronic hypoxic respiratory failure, secondary to     sedation, COPD, and underlying CHF.  The acute phase has resolved. 8. Chronic benzodiazepine use.  Recommend not increasing her dose of     benzodiazepine which may adversely affect her respiratory status. 9. Vagina bleeding.  3-4 days back, the patient complained couple of     episodes of mucousy vagina bleeding.  She is status post     hysterectomy.  I consulted the Gynecologist on-call, Dr. Henderson Cloud,     who recommended outpatient evaluation by her.  However, when we     called to make an appointment, we were told that she does not except  the patient's insurance and hence were given an appointment to see     her partner in the office. 10.History of CVA with residual right hemiparesis.  Stable.  The patient is extremely weak and deconditioned.  Attempts by 2 nurses to stand her up in the ICU were difficult.  The Physical Therapy, Occupational Therapy, Nursing and this MD recommended Rehab or Skilled Nursing Facility placement but the patient as well as the daughter have repeatedly declined  this offer and want to go home.  They have been advised regarding the risks of falls and injuries.  They verbalizes understanding but are comfortable with this decision.  DISPOSITION:  The patient is discharged home in stable condition.  FOLLOWUP: 1. With Dr. Windle Guard.  The patient or family is to call for an     appointment to be seen in 5-7 days. 2. Dr. Miguel Aschoff, Gynecology.  The patient has an appointment to be     seen on November 11, 2010 at 2:45 p.m.  Time taken in coordinating this discharge is an hour.     Marcellus Scott, MD     AH/MEDQ  D:  11/09/2010  T:  11/09/2010  Job:  [id number]  cc:   Windle Guard, M.D. Fax: [phone]  Miguel Aschoff, M.D.  Electronically Signed by Marcellus Scott MD on 11/22/2010 09:43:24 PM

## 2010-12-10 LAB — BASIC METABOLIC PANEL
BUN: 15 mg/dL (ref 6–23)
BUN: 26 mg/dL — ABNORMAL HIGH (ref 6–23)
BUN: 16 mg/dL (ref 6–23)
CO2: 28 mEq/L (ref 19–32)
CO2: 33 mEq/L — ABNORMAL HIGH (ref 19–32)
CO2: 32 mEq/L (ref 19–32)
Calcium: 8.8 mg/dL (ref 8.4–10.5)
Chloride: 101 mEq/L (ref 96–112)
Chloride: 98 mEq/L (ref 96–112)
Chloride: 99 mEq/L (ref 96–112)
Chloride: 101 mEq/L (ref 96–112)
Creatinine, Ser: 0.88 mg/dL (ref 0.4–1.2)
Creatinine, Ser: 0.98 mg/dL (ref 0.4–1.2)
Creatinine, Ser: 1.09 mg/dL (ref 0.4–1.2)
Creatinine, Ser: 0.89 mg/dL (ref 0.4–1.2)
GFR calc Af Amer: 59 mL/min — ABNORMAL LOW (ref >60)
GFR calc Af Amer: >60 mL/min (ref >60)
GFR calc Af Amer: >60 mL/min (ref >60)
GFR calc non Af Amer: 58 mL/min — ABNORMAL LOW (ref >60)
GFR calc non Af Amer: >60 mL/min (ref >60)
Glucose, Bld: 209 mg/dL — ABNORMAL HIGH (ref 70–99)
Glucose, Bld: 268 mg/dL — ABNORMAL HIGH (ref 70–99)
Potassium: 3.7 mEq/L (ref 3.5–5.1)
Potassium: 4.3 mEq/L (ref 3.5–5.1)
Potassium: 4.8 mEq/L (ref 3.5–5.1)
Sodium: 139 mEq/L (ref 135–145)
Sodium: 141 mEq/L (ref 135–145)

## 2010-12-10 LAB — GLUCOSE, CAPILLARY
Glucose-Capillary: 147 mg/dL — ABNORMAL HIGH (ref 70–99)
Glucose-Capillary: 157 mg/dL — ABNORMAL HIGH (ref 70–99)
Glucose-Capillary: 164 mg/dL — ABNORMAL HIGH (ref 70–99)
Glucose-Capillary: 174 mg/dL — ABNORMAL HIGH (ref 70–99)
Glucose-Capillary: 178 mg/dL — ABNORMAL HIGH (ref 70–99)
Glucose-Capillary: 195 mg/dL — ABNORMAL HIGH (ref 70–99)
Glucose-Capillary: 233 mg/dL — ABNORMAL HIGH (ref 70–99)
Glucose-Capillary: 235 mg/dL — ABNORMAL HIGH (ref 70–99)
Glucose-Capillary: 249 mg/dL — ABNORMAL HIGH (ref 70–99)
Glucose-Capillary: 265 mg/dL — ABNORMAL HIGH (ref 70–99)
Glucose-Capillary: 282 mg/dL — ABNORMAL HIGH (ref 70–99)

## 2010-12-10 LAB — LIPID PANEL
LDL Cholesterol: 114 mg/dL — ABNORMAL HIGH (ref 0–99)
Total CHOL/HDL Ratio: 5.3 RATIO
Triglycerides: 190 mg/dL — ABNORMAL HIGH (ref <150)
VLDL: 38 mg/dL (ref 0–40)

## 2010-12-10 LAB — CBC
HCT: 37.5 % (ref 36.0–46.0)
HCT: 37.5 % (ref 36.0–46.0)
HCT: 38 % (ref 36.0–46.0)
Hemoglobin: 11.5 g/dL — ABNORMAL LOW (ref 12.0–15.0)
Hemoglobin: 11.5 g/dL — ABNORMAL LOW (ref 12.0–15.0)
Hemoglobin: 11.7 g/dL — ABNORMAL LOW (ref 12.0–15.0)
MCH: 28 pg (ref 26.0–34.0)
MCH: 28.6 pg (ref 26.0–34.0)
MCHC: 31.2 g/dL (ref 30.0–36.0)
MCV: 89.7 fL (ref 78.0–100.0)
MCV: 92.7 fL (ref 78.0–100.0)
MCV: 92.2 fL (ref 78.0–100.0)
Platelets: 311 10*3/uL (ref 150–400)
Platelets: 266 10*3/uL (ref 150–400)
RBC: 4.1 MIL/uL (ref 3.87–5.11)
RBC: 4.13 MIL/uL (ref 3.87–5.11)
RBC: 4.12 MIL/uL (ref 3.87–5.11)
RDW: 14.6 % (ref 11.5–15.5)

## 2010-12-10 LAB — DIFFERENTIAL
Basophils Absolute: 0.1 10*3/uL (ref 0.0–0.1)
Basophils Relative: 1 % (ref 0–1)
Eosinophils Relative: 6 % — ABNORMAL HIGH (ref 0–5)
Lymphs Abs: 1.9 10*3/uL (ref 0.7–4.0)
Monocytes Absolute: 0.6 10*3/uL (ref 0.1–1.0)

## 2010-12-10 LAB — TSH: TSH: 1.341 u[IU]/mL (ref 0.350–4.500)

## 2010-12-10 LAB — URINALYSIS, ROUTINE W REFLEX MICROSCOPIC
Bilirubin Urine: NEGATIVE
Leukocytes, UA: NEGATIVE
Nitrite: POSITIVE — AB
Specific Gravity, Urine: 1.033 — ABNORMAL HIGH (ref 1.005–1.030)
Urobilinogen, UA: 0.2 mg/dL (ref 0.0–1.0)
pH: 8 (ref 5.0–8.0)

## 2010-12-10 LAB — CULTURE, BLOOD (ROUTINE X 2)
Culture  Setup Time: 201109150838
Culture: NO GROWTH

## 2010-12-10 LAB — POCT CARDIAC MARKERS
CKMB, poc: <1 ng/mL — ABNORMAL LOW (ref 1.0–8.0)
Myoglobin, poc: 96.7 ng/mL (ref 12–200)
Troponin i, poc: <0.05 ng/mL (ref 0.00–0.09)

## 2010-12-10 LAB — HEMOGLOBIN A1C
Hgb A1c MFr Bld: 7.3 % — ABNORMAL HIGH (ref <5.7)
Mean Plasma Glucose: 163 mg/dL — ABNORMAL HIGH (ref <117)

## 2010-12-10 LAB — CK TOTAL AND CKMB (NOT AT ARMC): Relative Index: INVALID (ref 0.0–2.5)

## 2010-12-10 LAB — URINE MICROSCOPIC-ADD ON

## 2010-12-10 LAB — CARDIAC PANEL(CRET KIN+CKTOT+MB+TROPI)
CK, MB: 0.8 ng/mL (ref 0.3–4.0)
CK, MB: 0.9 ng/mL (ref 0.3–4.0)
Total CK: 69 U/L (ref 7–177)
Troponin I: 0.03 ng/mL (ref 0.00–0.06)

## 2010-12-10 LAB — BRAIN NATRIURETIC PEPTIDE: Pro B Natriuretic peptide (BNP): 500 pg/mL — ABNORMAL HIGH (ref 0.0–100.0)

## 2010-12-10 LAB — D-DIMER, QUANTITATIVE: D-Dimer, Quant: 2.24 ug/mL-FEU — ABNORMAL HIGH (ref 0.00–0.48)

## 2010-12-10 LAB — TROPONIN I: Troponin I: 0.02 ng/mL (ref 0.00–0.06)

## 2010-12-10 LAB — MAGNESIUM: Magnesium: 1.6 mg/dL (ref 1.5–2.5)

## 2010-12-13 LAB — CBC
HCT: 32 % — ABNORMAL LOW (ref 36.0–46.0)
HCT: 32.2 % — ABNORMAL LOW (ref 36.0–46.0)
Hemoglobin: 10.7 g/dL — ABNORMAL LOW (ref 12.0–15.0)
MCH: 28.7 pg (ref 26.0–34.0)
MCH: 29.5 pg (ref 26.0–34.0)
MCHC: 33.2 g/dL (ref 30.0–36.0)
MCV: 87 fL (ref 78.0–100.0)
MCV: 88 fL (ref 78.0–100.0)
MCV: 88.1 fL (ref 78.0–100.0)
Platelets: 285 10*3/uL (ref 150–400)
Platelets: 307 10*3/uL (ref 150–400)
Platelets: 338 10*3/uL (ref 150–400)
Platelets: 348 10*3/uL (ref 150–400)
RBC: 3.58 MIL/uL — ABNORMAL LOW (ref 3.87–5.11)
RDW: 15.5 % (ref 11.5–15.5)
RDW: 15.7 % — ABNORMAL HIGH (ref 11.5–15.5)
RDW: 16.1 % — ABNORMAL HIGH (ref 11.5–15.5)
RDW: 16.3 % — ABNORMAL HIGH (ref 11.5–15.5)
WBC: 8.3 10*3/uL (ref 4.0–10.5)
WBC: 9.2 10*3/uL (ref 4.0–10.5)
WBC: 9.9 10*3/uL (ref 4.0–10.5)

## 2010-12-13 LAB — POCT CARDIAC MARKERS: Myoglobin, poc: 83.9 ng/mL (ref 12–200)

## 2010-12-13 LAB — BASIC METABOLIC PANEL
BUN: 10 mg/dL (ref 6–23)
BUN: 10 mg/dL (ref 6–23)
CO2: 28 mEq/L (ref 19–32)
Calcium: 8.3 mg/dL — ABNORMAL LOW (ref 8.4–10.5)
Chloride: 109 mEq/L (ref 96–112)
GFR calc Af Amer: >60 mL/min (ref >60)
GFR calc non Af Amer: >60 mL/min (ref >60)
GFR calc non Af Amer: >60 mL/min (ref >60)
GFR calc non Af Amer: >60 mL/min (ref >60)
Glucose, Bld: 142 mg/dL — ABNORMAL HIGH (ref 70–99)
Glucose, Bld: 147 mg/dL — ABNORMAL HIGH (ref 70–99)
Glucose, Bld: 206 mg/dL — ABNORMAL HIGH (ref 70–99)
Potassium: 3.8 mEq/L (ref 3.5–5.1)
Potassium: 4.1 mEq/L (ref 3.5–5.1)
Potassium: 4.6 mEq/L (ref 3.5–5.1)
Sodium: 138 mEq/L (ref 135–145)
Sodium: 138 mEq/L (ref 135–145)
Sodium: 140 mEq/L (ref 135–145)
Sodium: 140 mEq/L (ref 135–145)

## 2010-12-13 LAB — HEMOGLOBIN AND HEMATOCRIT, BLOOD: HCT: 32.6 % — ABNORMAL LOW (ref 36.0–46.0)

## 2010-12-13 LAB — TYPE AND SCREEN
ABO/RH(D): A POS
Antibody Screen: NEGATIVE

## 2010-12-13 LAB — COMPREHENSIVE METABOLIC PANEL
Albumin: 3 g/dL — ABNORMAL LOW (ref 3.5–5.2)
BUN: 19 mg/dL (ref 6–23)
Calcium: 8.9 mg/dL (ref 8.4–10.5)
Creatinine, Ser: 0.95 mg/dL (ref 0.4–1.2)
Total Protein: 6.4 g/dL (ref 6.0–8.3)

## 2010-12-13 LAB — URINE CULTURE

## 2010-12-13 LAB — URINALYSIS, ROUTINE W REFLEX MICROSCOPIC
Ketones, ur: NEGATIVE mg/dL
Nitrite: NEGATIVE
Protein, ur: NEGATIVE mg/dL
Urobilinogen, UA: 1 mg/dL (ref 0.0–1.0)
pH: 5 (ref 5.0–8.0)

## 2010-12-13 LAB — PROTIME-INR: INR: 1.05 (ref 0.00–1.49)

## 2010-12-13 LAB — URINE MICROSCOPIC-ADD ON

## 2010-12-13 LAB — ABO/RH: ABO/RH(D): A POS

## 2010-12-14 LAB — BASIC METABOLIC PANEL
BUN: 20 mg/dL (ref 6–23)
CO2: 28 mEq/L (ref 19–32)
Calcium: 8.5 mg/dL (ref 8.4–10.5)
Chloride: 106 mEq/L (ref 96–112)
Creatinine, Ser: 0.92 mg/dL (ref 0.4–1.2)
GFR calc Af Amer: >60 mL/min (ref >60)
GFR calc non Af Amer: 59 mL/min — ABNORMAL LOW (ref >60)
Sodium: 140 mEq/L (ref 135–145)

## 2010-12-14 LAB — GLUCOSE, CAPILLARY
Glucose-Capillary: 184 mg/dL — ABNORMAL HIGH (ref 70–99)
Glucose-Capillary: 239 mg/dL — ABNORMAL HIGH (ref 70–99)
Glucose-Capillary: 246 mg/dL — ABNORMAL HIGH (ref 70–99)
Glucose-Capillary: 248 mg/dL — ABNORMAL HIGH (ref 70–99)
Glucose-Capillary: 353 mg/dL — ABNORMAL HIGH (ref 70–99)
Glucose-Capillary: 364 mg/dL — ABNORMAL HIGH (ref 70–99)

## 2010-12-14 LAB — CBC
Hemoglobin: 10.8 g/dL — ABNORMAL LOW (ref 12.0–15.0)
MCHC: 31.8 g/dL (ref 30.0–36.0)
MCV: 90.2 fL (ref 78.0–100.0)
Platelets: 299 10*3/uL (ref 150–400)
RBC: 3.75 MIL/uL — ABNORMAL LOW (ref 3.87–5.11)
WBC: 9.1 10*3/uL (ref 4.0–10.5)

## 2010-12-15 LAB — EXPECTORATED SPUTUM ASSESSMENT W GRAM STAIN, RFLX TO RESP C

## 2010-12-15 LAB — BASIC METABOLIC PANEL
CO2: 29 mEq/L (ref 19–32)
Calcium: 8.3 mg/dL — ABNORMAL LOW (ref 8.4–10.5)
Creatinine, Ser: 0.89 mg/dL (ref 0.4–1.2)
GFR calc Af Amer: >60 mL/min (ref >60)
Glucose, Bld: 157 mg/dL — ABNORMAL HIGH (ref 70–99)

## 2010-12-15 LAB — CARDIAC PANEL(CRET KIN+CKTOT+MB+TROPI)
CK, MB: 0.7 ng/mL (ref 0.3–4.0)
CK, MB: 0.9 ng/mL (ref 0.3–4.0)
Relative Index: INVALID (ref 0.0–2.5)
Relative Index: INVALID (ref 0.0–2.5)
Relative Index: INVALID (ref 0.0–2.5)
Total CK: 35 U/L (ref 7–177)
Troponin I: 0.01 ng/mL (ref 0.00–0.06)
Troponin I: 0.01 ng/mL (ref 0.00–0.06)
Troponin I: 0.02 ng/mL (ref 0.00–0.06)

## 2010-12-15 LAB — URINALYSIS, ROUTINE W REFLEX MICROSCOPIC
Glucose, UA: NEGATIVE mg/dL
Hgb urine dipstick: NEGATIVE
Protein, ur: NEGATIVE mg/dL
pH: 5.5 (ref 5.0–8.0)

## 2010-12-15 LAB — GLUCOSE, CAPILLARY
Glucose-Capillary: 239 mg/dL — ABNORMAL HIGH (ref 70–99)
Glucose-Capillary: 311 mg/dL — ABNORMAL HIGH (ref 70–99)

## 2010-12-15 LAB — CULTURE, BLOOD (ROUTINE X 2): Culture: NO GROWTH

## 2010-12-15 LAB — DIFFERENTIAL
Basophils Absolute: 0.1 10*3/uL (ref 0.0–0.1)
Basophils Relative: 1 % (ref 0–1)
Monocytes Absolute: 0.6 10*3/uL (ref 0.1–1.0)
Neutro Abs: 4.4 10*3/uL (ref 1.7–7.7)
Neutrophils Relative %: 59 % (ref 43–77)

## 2010-12-15 LAB — HEMOGLOBIN A1C
Hgb A1c MFr Bld: 7.2 % — ABNORMAL HIGH (ref <5.7)
Mean Plasma Glucose: 160 mg/dL — ABNORMAL HIGH (ref <117)

## 2010-12-15 LAB — TSH: TSH: 1.092 u[IU]/mL (ref 0.350–4.500)

## 2010-12-15 LAB — POCT CARDIAC MARKERS
CKMB, poc: <1 ng/mL — ABNORMAL LOW (ref 1.0–8.0)
Myoglobin, poc: 77 ng/mL (ref 12–200)
Troponin i, poc: <0.05 ng/mL (ref 0.00–0.09)

## 2010-12-15 LAB — CULTURE, RESPIRATORY W GRAM STAIN: Culture: NORMAL

## 2010-12-15 LAB — CBC
MCHC: 31.8 g/dL (ref 30.0–36.0)
Platelets: 319 10*3/uL (ref 150–400)
RDW: 15.8 % — ABNORMAL HIGH (ref 11.5–15.5)

## 2010-12-15 LAB — BRAIN NATRIURETIC PEPTIDE: Pro B Natriuretic peptide (BNP): 84.7 pg/mL (ref 0.0–100.0)

## 2010-12-15 LAB — LEGIONELLA ANTIGEN, URINE: Legionella Antigen, Urine: NEGATIVE

## 2010-12-16 LAB — COMPREHENSIVE METABOLIC PANEL
ALT: 8 U/L (ref 0–35)
AST: 14 U/L (ref 0–37)
CO2: 28 mEq/L (ref 19–32)
Calcium: 8.1 mg/dL — ABNORMAL LOW (ref 8.4–10.5)
Chloride: 108 mEq/L (ref 96–112)
Creatinine, Ser: 0.86 mg/dL (ref 0.4–1.2)
GFR calc Af Amer: >60 mL/min (ref >60)
GFR calc non Af Amer: >60 mL/min (ref >60)
Glucose, Bld: 137 mg/dL — ABNORMAL HIGH (ref 70–99)
Total Bilirubin: 0.6 mg/dL (ref 0.3–1.2)

## 2010-12-16 LAB — DIFFERENTIAL
Basophils Relative: 1 % (ref 0–1)
Eosinophils Absolute: 0.3 10*3/uL (ref 0.0–0.7)
Monocytes Absolute: 0.6 10*3/uL (ref 0.1–1.0)
Monocytes Relative: 5 % (ref 3–12)
Neutro Abs: 8.4 10*3/uL — ABNORMAL HIGH (ref 1.7–7.7)

## 2010-12-16 LAB — BASIC METABOLIC PANEL
Calcium: 8.3 mg/dL — ABNORMAL LOW (ref 8.4–10.5)
Creatinine, Ser: 0.89 mg/dL (ref 0.4–1.2)
GFR calc Af Amer: >60 mL/min (ref >60)
GFR calc non Af Amer: >60 mL/min (ref >60)
Sodium: 145 mEq/L (ref 135–145)

## 2010-12-16 LAB — CARDIAC PANEL(CRET KIN+CKTOT+MB+TROPI)
CK, MB: 0.6 ng/mL (ref 0.3–4.0)
Relative Index: INVALID (ref 0.0–2.5)
Total CK: 28 U/L (ref 7–177)
Troponin I: 0.02 ng/mL (ref 0.00–0.06)

## 2010-12-16 LAB — CBC
Hemoglobin: 7.3 g/dL — ABNORMAL LOW (ref 12.0–15.0)
Hemoglobin: 9.1 g/dL — ABNORMAL LOW (ref 12.0–15.0)
MCHC: 33.7 g/dL (ref 30.0–36.0)
MCHC: 34.1 g/dL (ref 30.0–36.0)
MCV: 93.1 fL (ref 78.0–100.0)
MCV: 94.3 fL (ref 78.0–100.0)
Platelets: 300 10*3/uL (ref 150–400)
RBC: 2.3 MIL/uL — ABNORMAL LOW (ref 3.87–5.11)
RBC: 2.86 MIL/uL — ABNORMAL LOW (ref 3.87–5.11)
WBC: 9.3 10*3/uL (ref 4.0–10.5)

## 2010-12-16 LAB — TYPE AND SCREEN

## 2010-12-16 LAB — CK TOTAL AND CKMB (NOT AT ARMC)
CK, MB: 0.5 ng/mL (ref 0.3–4.0)
Relative Index: INVALID (ref 0.0–2.5)
Total CK: 22 U/L (ref 7–177)

## 2010-12-16 LAB — POCT I-STAT, CHEM 8
BUN: 56 mg/dL — ABNORMAL HIGH (ref 6–23)
Calcium, Ion: 1.16 mmol/L (ref 1.12–1.32)
Chloride: 110 mEq/L (ref 96–112)
Potassium: 4.2 mEq/L (ref 3.5–5.1)

## 2010-12-16 LAB — HEMOGLOBIN AND HEMATOCRIT, BLOOD
Hemoglobin: 8.6 g/dL — ABNORMAL LOW (ref 12.0–15.0)
Hemoglobin: 8.9 g/dL — ABNORMAL LOW (ref 12.0–15.0)

## 2010-12-16 LAB — ABO/RH: ABO/RH(D): A POS

## 2010-12-16 LAB — HEMOCCULT GUIAC POC 1CARD (OFFICE): Fecal Occult Bld: POSITIVE

## 2010-12-16 LAB — PROTIME-INR: Prothrombin Time: 14.5 seconds (ref 11.6–15.2)

## 2011-02-09 NOTE — Assessment & Plan Note (Signed)
[name] returns today.  She was supposed to come in 2 days ago.  She  no showed.  She is here today for followup.  She was last seen by  nursing last month.  She was weaned off her methadone.  She was down to  5 mg 2 p.o. b.i.d.  She has been out of this for 3 days.  She has been  out of her Vicodin for 2 days.  Her prescription for the Vicodin was  dated December 31, 2008, she should have that.  Methadone was December 26, 2008,  so it was reasonable that she is out of that.   Daughter is with her today and reports that she is harder to transfer  and the patient also has been more inactive as a result of reduced  medications over last few days.  The patient feels blah, but no other  significant withdrawal reaction.   FUNCTIONAL STATUS:  Review of systems and pain levels.   PHYSICAL EXAMINATION:  VITAL SIGNS:  Blood pressure 154/41, pulse 50 and  irregular, respirations 18, and O2 sat 90% on room air.  GENERAL:  No acute distress.  An elderly female in no acute stress.  NEUROLOGIC:  Mood and affect appropriate.  She has 4/5 strength on the  right side and 5- on the left side.  She has minimal pitting edema.  She  has some coughing and some congestion.  She is alert.   IMPRESSION:  1. Lumbar spinal stenosis.  2. 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      She has had relief and improved functioning on narcotic analgesic      medications given her age as well as comorbidities, I wanted to get      her off of methadone and she is essentially off of this at the      current time.  I do not think she needs any further taper given      that she has had no doses in 3 days.   PLAN:  Discussed with the patient treatment options and plan.  She will  have MS Contin 15 mg p.o. b.i.d.  She will continue the Vicodin, but  reduce it from 5 per day to 4 per day.  Nursing visit in 1 month for  pill count monitor compliance and I will see her back in 3 months.  May  need repeat  urine drug screen.      Erick Colace, M.D.  Electronically Signed     AEK/MedQ  D:  01/30/2009 11:56:58  T:  01/31/2009 01:26:36  Job #:  [id number]

## 2011-02-09 NOTE — Assessment & Plan Note (Signed)
PRIMARY CARE PHYSICIAN:  Burnell Blanks, MD   A 77 year old female, former patient of Dr. Lamar Benes, last seen by me  on Jan 30, 2009.  She had another no show on March 11, 2009.  She had a no  show on Jan 30, 2009.  She has been weaned down on her methadone.  She  has been converted to MS Contin 50 mg p.o. b.i.d., and Vicodin 10/325  one p.o. four times daily.   The patient's daughter was reportedly in the hospital, this has been  upsetting for her.  The daughter is with her today.  The patient has a  history of CVA with right hemiplegia.  She has had some increased back  pain mainly in the low back area.  She has had no falls or other trauma.  Pain is in the 6/10 range, averaging 8.   Walking tolerance is 2 minutes, which is actually better than in  February.   She needs assistance with all her self-care mobility.   She has bowel and bladder control problems that are chronic.  She has  had some confusion, trouble walking, spasms.   PAST MEDICAL HISTORY:  Diabetes, stroke, high blood pressure, heart  disease.   SOCIAL HISTORY:  Widowed, lives with her daughter.   PHYSICAL EXAMINATION:  VITAL SIGNS:  Blood pressure 137/72, pulse 67,  respirations 18, O2 sat 94% on room air.  GENERAL:  Elderly female with obvious right hemiparesis.  Orientation  x3, looks tired.  She can stand with min assist.  She has decreased  sensation on the right side in both upper and lower extremity.  She has  no evidence of spasticity.  She has mild tenderness to palpation along  the lumbar paraspinals with mostly pain right around the L5 spinous  process.   Lower extremity strength remains stable.  She has 3- in the right lower  extremity, 4- on the right side, and 5- on the left side.   IMPRESSION:  1. Lumbar spinal stenosis.  This is the etiology of her main pain.      She does have a history of compression fracture and given that she      has had some increased pain in lower lumbar area, we will  repeat an      x-ray of that area.  2. 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   PLAN:  We will continue current medications.  We will do repeat urine  drug screen given some irregularities with no shows and complaints of  increased pain, as well as daughter's involvement.  I will see her back  in about 1-2 months.      Erick Colace, M.D.  Electronically Signed     AEK/MedQ  D:  03/25/2009 15:51:04  T:  03/26/2009 06:55:10  Job #:  [id number]   cc:   Burnell Blanks, MD  Fax: [phone]

## 2011-02-09 NOTE — Consult Note (Signed)
[name], [name] NO.:  [id number]   MEDICAL RECORD NO.:  [id number]          PATIENT TYPE:  IPS   LOCATION:  4032                         FACILITY:  [facility]   PHYSICIAN:  [name], M.D.     DATE OF BIRTH:  1929/11/29   DATE OF CONSULTATION:  06/19/2007  DATE OF DISCHARGE:                                 CONSULTATION   CHIEF COMPLAINT:  Chest pain.   This is a 77 year old female with a history of prior CVA in January of  this year who was admitted on September 15 with new right-sided  weakness.  MRI revealed an acute infarct in left MCA territory.  MRI  revealed a focal occlusion of the angular branch of the left MCA.  There  was no carotid stenosis found.  2-D echocardiogram was negative for  thrombus or source of embolus.  The patient was placed on Aggrenox for  stroke prophylaxis and is now in rehab.  This morning she had an episode  of substernal chest pain which resolved with one sublingual  nitroglycerin.  Apparently she has had some chest pain syndromes in the  past which she has taken nitroglycerin for and apparently took one  sublingual nitroglycerin at home before she came the hospital during her  stroke.  She denies any radiation of pain into her shoulders or neck, no  shortness of breath, PND, orthopnea, no associated nausea or vomiting  with the chest pain, although she has been nauseated during this  hospital stay. Her initial cardiac enzymes are negative.  2-D  echocardiogram again was negative as far as regional wall motion  abnormalities.   PAST MEDICAL HISTORY:  Includes non-insulin dependent diabetes mellitus,  chronic low back pain, COPD.  The patient is on home O2 at  night,hypertension, arrhythmia, gallstone history, history of  dyslipidemia, small bowel obstructions, history of recurrent UTI,  thrombocytopenia chronic tobacco abuse.  She currently does not smoke.   FAMILY HISTORY:  Is positive for CAD.   SOCIAL HISTORY:  She  lives with her daughter who can assist her as  needed.  She has a one level house.   ALLERGIES:  None.   CURRENT MEDICATIONS:  1. Subcu Lovenox for DVT prophylaxis  2. Protonix 40 mg a day  3. Aggrenox q.h.s.  4. Insulin  5. Simvastatin 40 mg daily  6. Glipizide 10 mg a day  7. Nicotine patch  8. Diltiazem 240 mg a day  9. Lasix 40 mg a day  10.Actigall 300 mg b.i.d.  11.Potassium 20 mEq daily  12.Methadone 50 mg q.8 h  13.Prednisone 20 mg daily  14.Spiriva Inhaler.   REVIEW OF SYSTEMS:  Otherwise is negative.   PHYSICAL EXAM:  Blood pressure is 150/79 to 172/71, heart rate 70s.  This is a well-developed, well-nourished female lying in bed in no acute  distress H&P is benign.  NECK:  Supple without lymphadenopathy, no bruits  LUNGS:  Clear to auscultation throughout.  HEART:  Regular rate and rhythm.  No murmurs, rubs or gallops, normal  S1, S2.  ABDOMEN:  Benign.  EXTREMITIES:  No  edema.   LABS:  CMET is within normal limits.  CPK-MB and troponin negative x2.  White cell count 11.3, hemoglobin 14.9, hematocrit 42.8.  EKG shows  sinus rhythm with right bundle branch block, otherwise no ST changes or  ischemia.  2-D echocardiogram showed normal LV function, trivial  __________   ASSESSMENT:  1. Chest pain syndrome of questionable etiology.  EKG reviewed and is      nonischemic.  Cardiac enzymes negative x2.  Cardiac risk factors      include diabetes and hypertension.  2. Diabetes mellitus.  3. Hypertension with elevated blood pressure.  4. COPD  5. CVA of the left MCA  6. History of paroxysmal atrial fibrillation.   PLAN:  1. Continue check enzymes if negative x3.  Adenosine Cardiolite to      rule out ischemia versus dobutamine given her underlying history of      COPD.  2. Aggressive treatment blood pressure, will increase Diltiazem CD to      300 mg a day.      [name], M.D.  Electronically Signed     TT/MEDQ  D:  06/19/2007  T:  06/19/2007   Job:  [id number]

## 2011-02-09 NOTE — H&P (Signed)
[name], [name]               ACCOUNT NO.:  [id number]   MEDICAL RECORD NO.:  [id number]          PATIENT TYPE:  INP   LOCATION:  3702                         FACILITY:  [facility]   PHYSICIAN:  Lonia Blood, M.D.      DATE OF BIRTH:  1930/02/25   DATE OF ADMISSION:  06/12/2007  DATE OF DISCHARGE:                              HISTORY & PHYSICAL   PRIMARY CARE PHYSICIAN:  Dr. Jeannetta Nap.   PRESENTING COMPLAINT:  Right-sided weakness.   HISTORY OF PRESENT ILLNESS:  The patient is a 77 year old female with  prior history of CVA as well as diabetes and multiple other medical  problems who presented today secondary to right-sided weakness.  Per  patient she woke up around 11:00 this morning from sleep with paralysis.  She could not move her left side.  She did not lose her ability to  speak.  She was, however, weak.  EMS was called and the patient was  brought into the emergency room.  She arrived more than 3 hours after  the onset of her symptoms, since she was outside the window for TPA.  The patient is apparently stable.  Some of her functions have returned  but is still weak, especially in the right upper extremity.  She had a  left-sided weakness considered to be a TIA backing general amnesia.   PAST MEDICAL HISTORY:  Significant for hypertension, diabetes, previous  CVA, arrhythmia, chronic back pain on chronic Methadone, chronic kidney  disease, gallstone history, spinal stenosis, dyslipidemia,  thrombocythemia, history of hypokalemia, history of partial small bowel  obstruction due to adhesions.  History of urinary tract infection.   MEDICATIONS:  1. Nortriptyline 75 mg q.h.s.  2. Spironolactone 50 mg daily.  3. Lasix 40 mg daily.  4. Promethazine 25 mg p.r.n.  5. Diltiazem 300 mg daily.  6. Omeprazole 20 mg twice a day.  7. Methadone 10 mg daily as needed.  8. Glipizide 10 mg twice a day.  9. Nitrofurantoin 100 mg twice a day.  10.Spiriva hand held inhaler.   ALLERGIES:  NO  KNOWN DRUG ALLERGIES.   SOCIAL HISTORY:  The patient lives in [place] with her daughter.  She  still smokes half to one pack per day.  Denied any alcohol use.   FAMILY HISTORY:  Noncontributory due to patient's age.   REVIEW OF SYSTEMS:  Negative except for HPI.   PHYSICAL EXAMINATION:  VITAL SIGNS:  Temperature is 98.4, blood pressure  138/69, pulse 75, respiratory rate 14, saturation 98% on room air.  GENERAL:  The patient is awake, alert, oriented and having a good  conversation.  No slurred speech.  HEENT:  PERRLA.  NECK:  Supple.  No JVD.  No lymphadenopathy.  RESPIRATORY:  Shows good air entry bilaterally.  No wheezes or rales.  CARDIOVASCULAR:  S1, S2, no murmurs.  ABDOMEN:  Soft and nontender with positive bowel sounds.  EXTREMITIES:  No edema, cyanosis or clubbing.   LABORATORY DATA:  Showed a white count of 9.5, hemoglobin 14.6, platelet  count 505, sodium 138, potassium 2.8, chloride 94, CO2 233, glucose 212,  BUN 13, creatinine 1.12, calcium 8.6, initial cardiac enzymes are  negative at 0.2, PT 13.3, INR 1.0.  CT of the head without contrast  showed no acute intracranial abnormality.  EKG shows normal sinus rhythm  with a rate of 75.  Right bundle branch block otherwise.   ASSESSMENT:  This is a 77 year old female with multiple risk factors for  stroke, presenting with what I feel to be an acute cerebrovascular  accident.  His CT was found negative for anything acute.   PLAN:  1. Acute cerebrovascular accident.  Patient arrived outside the 3 hour      window for TPA.  We will give her aspirin now and continue with      daily aspirin.  We will also check 2D echo again, check carotid      Doppler and MRI/MRA of the brain.  We will repeat her cholesterol      panel.  We will keep her NPO until she passes a swallowing      evaluation.  If she does, we will continue her home medications as      needed.  Once the patient is stable we will continue p.o. feeds.  2.  Diabetes.  We will continue with sliding scale insulin and also      once the patient is started on oral intake, we will continue with      her home medications.  3. Hypertension.  Again, blood pressure seems to be okay now but we      will also be monitoring this, due to her cerebrovascular accident      symptoms.  4. Dyslipidemia.  We will check a fasting lipid panel and continue      work up.  Continue with her medications once she starts oral      intake.  5. Chronic obstructive pulmonary disease.  I will put her on some      nebulizers.  She is still wheezing at this point but she is well      hydrated and no acute exacerbation.      Lonia Blood, M.D.  Electronically Signed     LG/MEDQ  D:  06/12/2007  T:  06/13/2007  Job:  [id number]

## 2011-02-09 NOTE — Discharge Summary (Signed)
NAMEMICAL, BRUN NO.:  [id number]   MEDICAL RECORD NO.:  [id number]          PATIENT TYPE:  INP   LOCATION:  5151                         FACILITY:  [facility]   PHYSICIAN:  Isidor Holts, M.D.  DATE OF BIRTH:  22-Jul-1930   DATE OF ADMISSION:  10/04/2007  DATE OF DISCHARGE:  10/10/2007                               DISCHARGE SUMMARY   PRIMARY CARE PHYSICIAN:  Aida Puffer, M.D. in [place], [place].   DISCHARGE DIAGNOSES:  1. Right lower lobe pneumonia.  2. Early urinary tract infection.  3. Dehydration.  4. Altered mental status secondary to #1, 2, and 3 above.  5. History of left middle cerebral artery cerebrovascular accident on      September 2008 with residual right hemiparesis.  6. Hypertension.  7. Chronic back pain/chronic pain syndrome.  8. Type 2 diabetes mellitus.  9. Cholelithiasis.  10.Chronic obstructive pulmonary disease.  11.Obesity.   DISCHARGE MEDICATIONS:  1. Cardizem CD 300 mg p.o. daily.  2. IMDUR 30 mg p.o. daily.  3. Toprol XL 25 mg p.o. daily (was on Metoprolol tartrate 50 mg p.o.      b.i.d.)  4. Aggrenox one p.o. b.i.d.  5. Spiriva HandiHaler one capsule daily.  6. Folic acid 1 mg p.o. daily.  7. Methadone 20 mg p.o. q.8 hours.  8. Levaquin 500 mg p.o. daily for five days only.  9. MiraLax 17 grams p.o. daily.  10.Omeprazole 20 mg p.o. daily.  11.Glipizide XL 2.5 mg p.o. daily (was on 5 mg p.o. b.i.d.)  12.Albuterol inhaler two puffs p.r.n. q.4-6 hours.  13.Urso Forte 500mg  p.o. b.i.d.  14.Vitamin D 50,000 internatiomal units on Sundays.  15.Multivitamin one p.o. daily.   Note: Lasix, Januvia and Phenergan are on hold, until reviewed by  primary MD.   PROCEDURE:  1. Chest x-ray dated October 04, 2007, showed findings suspicious for      right base infiltrate.  2. Head CT scan dated October 04, 2007, was negative for acute      intracranial hemorrhage.  There were low density areas involving      the left white  matter tract suggestive of an old infarct.  In      addition, there is evidence of chronic small vessel ischemic      changes.  Due to underlying white matter disease, difficult to      exclude an acute or subacute infarct.  3. Brain MRI dated October 05, 2007, showed atrophy and chronic      ischemia in the white matter.  No acute infarct.  4. Two-view chest x-ray dated October 08, 2007, showed mild bibasilar      atelectasis, stable.  Small left effusion.  No sign of pulmonary      edema.   CONSULTATIONS:  None.   HISTORY OF PRESENT ILLNESS:  As per H&P note of October 04, 2007,  dictated by Lonia Blood, M.D.  Briefly, this is a 77 year old female,  with known history of left MCA infarct in September of 2008, now with  residual right hemiparesis, COPD, chronic pain syndrome, chronic back  pain,  hypertension, type 2 diabetes mellitus, cholelithiasis, remote  history of partial small bowel obstruction, obesity, who presents with  chills, confusion, and cough.  Chest x-ray demonstrated early right  lower lobe infiltrate and she was admitted for further evaluation,  investigation, and management.   HOSPITAL COURSE:  Problem 1.  Right lower lobe pneumonia.  For details  of presentation, refer to admission history above.  The patient was  managed by intravenous antibiotics, ie, Levaquin, bronchodilator,  nebulizers, oxygen supplementation with satisfactory clinical response.  During the course of her hospitalization, no pyrexia was recorded and  the patient markedly improved.  The follow-up chest x-ray on October 08, 2007, showed no evidence of pneumonic consolidation.  We were therefore  able to transition the patient to oral Avelox after a five-day course of  intravenous therapy, and she is anticipated to complete a further five  days of treatment for a total of ten days of antibiotic treatment.   Problem 2.  Early UTI.  The patient had a positive urine sediment  consistent with urinary  tract infection.  It is felt that this was  adequately covered by Levaquin.  Subsequent urine cultures demonstrated  20,000 colonies of Escherichia coli.  This is felt to be contamination  or early UTI.  Be that as it may, this has been adequately addressed  with Levaquin.   Problem 3.  Dehydration.  The patient at the time of presentation had a  BUN of 27 with a creatinine of 1.2, against the background of continuing  diuretic therapy.  Diuretics were held.  The patient was managed with  intravenous hydration and we are pleased to note that as of October 08, 2007, her BUN was 14 with creatinine of 1.08.  We were able to  discontinue intravenous fluids without any deleterious effects  whatsoever.  Lasix is currently on hold until reviewed by primary M.D.  in due course.   Problem 4.  Altered mental status.  The patient presented with mild  confusion.  This was likely secondary to #1, 2, and 3 above.  By day #2  of hospitalization, the patient's mental status had returned to  baseline.  No further episodes of confusion were reported during this  hospitalization.   Problem 5.  Hypertension.  The patient remained normotensive throughout  the course of this hospitalization on pre-admission antihypertensive  medications.   Problem 6.  Chronic back pain/chronic pain syndrome.  The patient  reportedly, has been on Methadone for one year.  This was continued  during the patient's hospitalization at pre-admission dosages, and there  were no issues related to back pain.   Problem 7.  Type 2 diabetes mellitus.  This was adequately managed with  carbohydrate modified diet and sliding scale insulin coverage.  The  patient remained euglycemic on this regimen.  Her Glipizide XL has been  recommenced at a dose of 2.5 mg p.o. daily and Januvia has been held to  avoid hypoglycemia, until reevaluated by primary M.D.   Problem 8.  History of cholelithiasis.  There were no problems referable  to  this, during the course of the patient's hospitalization.   Problem 9.  COPD.  There was no exacerbation against the background of  pneumonia.  The patient continues on Spiriva HandiHaler and p.r.n.  bronchodilators.   DISPOSITION:  The patient was on October 10, 2007, considered clinically  stable for discharge as she has now quite recovered from her acute  medical problems.  She has  been evaluated by PT/OT and recommended  either inpatient rehabilitation in CIR at [facility] or SNF  rehab.  The patient, however, is adamantly against going to SNF and as a  matter of fact would prefer to return to her own home.  It appears,  however, that she is going to need 24-hour care as well as home health  PT/OT and it is doubtful whether given her home circumstances 24-hour  care can be effected.  The patient has elected to confer with her family  and render her decision.  However, the recommendation of this M.D. is  for further period of short term inpatient rehabilitation prior to  discharge.  Appropriate disposition will be effected once the patient's  preference is known.  Should the patient be discharged to home, she is  recommended to follow up with her primary doctor, Dr. Aida Puffer in  [place], [place], within 1-2 weeks of discharge.      Isidor Holts, M.D.  Electronically Signed     CO/MEDQ  D:  10/10/2007  T:  10/10/2007  Job:  [id number]   cc:   Aida Puffer

## 2011-02-09 NOTE — Discharge Summary (Signed)
NAME:  [name], [name]               ACCOUNT NO.:  [id number]   MEDICAL RECORD NO.:  [id number]          PATIENT TYPE:  INP   LOCATION:  6738                         FACILITY:  [facility]   PHYSICIAN:  Mobolaji B. Bakare, M.D.DATE OF BIRTH:  03/20/1930   DATE OF ADMISSION:  06/12/2007  DATE OF DISCHARGE:  06/16/2007                               DISCHARGE SUMMARY   PRIMARY CARE PHYSICIAN:  Windle Guard, M.D.   FINAL DIAGNOSES:  1. Left hemispheric cerebrovascular accident with right-sided      hemiparesis  2. Hyperhomocysteinemia.  3. Diabetes mellitus.  4. Pyuria.  5. Dyslipidemia.  6. Obesity.  7. Hypertension.  8. History of spinal stenosis.  9. Ongoing tobacco abuse.  10.Mild chronic obstructive pulmonary disease exacerbation.   SECONDARY DIAGNOSES:  1. Spinal stenosis.  2. Gallstones.  3. Depression.   PROCEDURES:  1. Head CT scan done on June 12, 2007, showed no acute      intracranial abnormality.  Mild chronic maxillary sinusitis.  2. MRI of the head done on June 12, 2007, showed acute left      hemispheric multifocal areas of infarction without associated      hemorrhage.  This lies within the left MCA vascular territory.      Chronic atrophy and small-vessel disease.  Prominent perivascular      spaces suggest underlying hypertension as well as diabetes as      possible etiologies of the acute and chronic ischemic changes.  3. MRA of the head showed focal occlusion, angular branch, left middle      cerebral artery, likely accounting for the observed acute      infarction.  No proximal stenosis in the carotid or basilar      arteries.  __________  intracranial atherosclerotic changes noted.  4. 2 D echocardiogram done on June 13, 2007, showed no      intracardiac source of emboli was apparent.  Ejection fraction 65-      75%, with Doppler parameters consistent with high left ventricular      filling pressure.  5. Carotid Dopplers showed diffuse,  irregular calcific mild plaque      throughout common carotid artery, external carotid artery and      internal carotid artery.  No significant stenosis.  Vertebral      artery was antegrade.   BRIEF HISTORY:  Ms. Heumann is a 77 year old Caucasian female who had a  right hemispheric CVA in January 2008.  She had acute stroke with left-  sided weakness and transient facial droop and dysarthria on October 10, 2006.  She had complete recovery of her symptoms.  The patient was  discharged home on Aggrenox.  She quit taking the Aggrenox because she  thought it was contributing to her nausea and since January she has not  been using Aggrenox or aspirin.   She presented to the emergency room on June 12, 2007, with right-  sided weakness.  She woke up about 11 a.m. on the day of admission and  realized that she could not move her right side.  She had mild  dysarthria, which had resolved at the time of evaluation.  The patient  arrived after the t-PA window and some of the weakness was reportedly  getting better.  She has hypertension, diabetes mellitus, and  dyslipidemia.  She is also a chronic smoker.   Initial vitals on admission revealed temperature of 98.4, blood pressure  of 138/69, pulse 75, respiratory rate 14, O2 saturations of 98%.  The  patient was alert and oriented in time, place and person on examination.  She had muscle power 2/5 in both lower and upper extremities.  She was  restarted on Aggrenox.  EKG on admission was normal sinus rhythm.  The  patient was admitted to a telemetry floor.   HOSPITAL COURSE:  Problem 1.  LEFT HEMISPHERIC CEREBROVASCULAR ACCIDENT:  The patient was  in normal sinus rhythm for 72 hours on telemetry.  A 2 D echocardiogram  did not show intracardiac source of emboli.  MRI of the brain revealed  multiple areas of acute infarction in the left hemisphere involving  insular cortex, subinsular white matter and posterior frontal cortex,  frontal  subcortical and periventricular white matter, all on the left.  This corresponds to the distribution of focal occlusion, angular branch,  left middle cerebral artery.  She has extensive atherosclerotic disease  on MRA of the brain.  Carotid Dopplers showed no significant stenosis.  The patient's homocysteine level was elevated.  She has been started on  Foltx.  Fasting lipid profile showed LDL of 108.  Hemoglobin A1c was  6.8.  TSH was normal.  The patient was not on statin prior to  hospitalization, and she was started on Zocor.   The patient was evaluated by physical therapy and rehab units.  She is  appropriate for rehab.  Prior to discharge muscle power of her upper and  lower extremities are improving.  She has been counseled on smoking  cessation.  Blood pressure was controlled during the course of  hospitalization.  Diabetes mellitus is fairly controlled.  During the  course of her hospitalization the blood glucose was initially normal for  some days and subsequently increased because she was not on a full dose  of glipizide.  This will be resumed prior to discharge.   Problem 2.  PYURIA:  The patient was receiving treatment for a urinary  tract infection with nitrofurantoin prior to hospitalization.  She  complained of bladder spasm, although she had a Foley catheter in place  at that time.  A repeat urinalysis was done and this revealed many  bacteria and white blood cells of 11-20, moderate leukocytes.  Urine  culture grew insignificant colonies of 7000.  She was started  empirically on Rocephin when the culture was available.  Given the  culture result and antecedent history of UTI, she will receive 2 more  days of Keflex 500 mg b.i.d.   Problem 3.  CHRONIC OBSTRUCTIVE PULMONARY DISEASE, MILD CHRONIC  OBSTRUCTIVE PULMONARY DISEASE EXACERBATION:  The patient had increased  cough and rhonchi during the course of hospitalization.  She was treated  with nebulization and  steroids.  She will complete steroid at the rehab  unit.   Problem 4.  HYPERTENSION:  The patient's blood pressure was initially  controlled during the course of hospitalization.  Noting that she has  been on Cardizem 300 mg daily and she has also been using furosemide and  spironolactone p.r.n. for elevated blood pressures, blood pressure went  up to 187/96 during the course of hospitalization.  ARB (Avapro 75 mg)  was started.  Renal function is okay.  Avapro can be titrated for blood  pressure as tolerated.   Problem 5.  DIABETES MELLITUS:  Hemoglobin A1c was fairly reasonable,  6.8.  She was initially managed with glipizide 10 mg daily during the  course of hospitalization as blood glucose could tolerate it.  Blood  glucose increased and this was subsequently optimized to her home dose.   Problem 6.  TOBACCO ABUSE:  The patient is currently on a nicotine patch  and she has been counseled regarding tobacco cessation.   DISCHARGE MEDICATIONS:  1. Albuterol 2.5 mg q.6h. and q.2h. p.r.n.  2. Atrovent 0.5 mg q.6h. and q.2h. p.r.n.  3. Aggrenox one capsule q.h.s. through June 26, 2007.  Then      Aggrenox one capsule p.o. b.i.d. thereafter.  4. Keflex 500 mg p.o. b.i.d. through June 17, 2007.  5. Cardizem 240 mg daily.  6. Avapro 75 mg daily.  7. Lovenox 40 mg daily.  8. Glipizide 10 mg p.o. b.i.d.  9. Sliding scale insulin.  10.Methadone 15 mg q.8h.  11.Nicotine patch 21 mg daily.  12.Protonix 40 mg daily.  13.Pyridium 200 mg p.o. b.i.d. through June 17, 2007.  14.Prednisone 40 mg p.o. daily through June 17, 2007, then 30 mg      p.o. daily through June 20, 2007, then 20 mg p.o. daily      through June 23, 2007, then 10 mg p.o. daily through June 26, 2007.  15.Zocor 40 mg daily.  16.Actigall 300 mg p.o. b.i.d.  17.Foltx one tablet p.o. daily.  18.Guaifenesin 15 mL q.4h. p.r.n.  19.Vicodin one to two tablets q.4h. p.r.n. pain.    DISCHARGE CONDITION:  Stable.  The patient is discharged to rehab unit  for continuation of therapy.   ADDENDUM:  The patient is noted as per H&P dictated in January 2008 to  have history of atrial fibrillation.  During that hospitalization for  stroke with left-sided weakness, she did not have atrial fibrillation  noted on telemetry.  During this hospitalization again there was no  atrial fibrillation noted on telemetry.  The patient stated that the  irregular heart has normalized since 2 years ago.  I consulted Dr. Pearlean Brownie  (neurologist).  He opined that there is no strong indication to put the  patient on anticoagulation at this point given no documented atrial  fibrillation; however, the patient can have an outpatient CardioNet done  as an outpatient, which will monitor her heart for 3 weeks.   RECOMMENDATIONS:  Outpatient CardioNet heart monitoring.  Defer decision  to Dr. Jeannetta Nap, the patient's primary care physician.      Mobolaji B. Corky Downs, M.D.  Electronically Signed     MBB/MEDQ  D:  06/16/2007  T:  06/16/2007  Job:  [id number]   cc:   Windle Guard, M.D.

## 2011-02-09 NOTE — Assessment & Plan Note (Signed)
Ms. Kunz returns to clinic today accompanied by her granddaughter.  The patient has been complaining of a lot of pain of her legs.  Her  granddaughter reports that elevation along with massaging helps them  generally in addition to the methadone and hydrocodone.  We have been  prescribing methadone 5 mg 3 tablets t.i.d., but actually she has been  getting it 5 mg 1 tablet twice a day.  She also has been using the  hydrocodone approximately 5-6 tablets per day.  Her granddaughter also  gives her over-the-counter ibuprofen 200 mg, but she is not sure exactly  how many pills she gives her per day.   MEDICATIONS:  1. Aggrenox daily.  2. Glipizide daily.  3. Zoloft daily.  4. Diazepam 10 mg at bedtime.  5. Methadone 5 mg 1 tablet b.i.d.  6. Vicodin 5/325 one tablet 4-6 times per day p.r.n.  7. Vitamin E daily.  8. Ibuprofen 200 mg p.r.n.   REVIEW OF SYSTEMS:  Noncontributory.   The patient reports that her pain interferes with her social life,  sleeping, standing, sitting, walking, lifting, personal care, and  homemaking.   PHYSICAL EXAMINATION:  Elderly adult female seated in manual chair.  Blood pressure is 153/43 with pulse of 69, respiratory rate 18, and O2  saturation 95% on room air.  The patient has right-sided strength of 4-  /5 and left-sided strength 4+/5.   IMPRESSION:  1. Status post left middle cerebral artery infarct with right      hemiparesis.  2. Chronic pain related to prior compression fracture and stroke.  3. History of tobacco abuse.  4. History of arrhythmia.  5. History of recurrent urinary tract infections.  6. Type 2 diabetes mellitus.  7. Hypertension.  8. Chronic obstructive pulmonary disease.  9. History of frequent urinary tract infections with hospitalizations.  10.Pneumonia.   In the office today, we did refill the patient's Ambien, which she has  used in the past at 10 mg to be used one-half to 1 half tablet p.o. at  bedtime p.r.n.  We also  refilled her hydrocodone as of November 01, 2008,  and her methadone at 5 mg t.i.d. as of November 11, 2008.  I warned her  granddaughter about using excess ibuprofen and maximum dose would be 6  tablets at 200 mg strength maximum per day.  We will plan on seeing the  patient in followup in this office in approximately 3 months' time.          ______________________________  Ellwood Dense, M.D.    DC/MedQ  D:  10/25/2008 13:15:23  T:  10/26/2008 78:29:56  Job #:  [id number]

## 2011-02-09 NOTE — Assessment & Plan Note (Signed)
[name] returns to the clinic today accompanied by her daughter.  Overall, the patient is doing fairly well.  She was last seen in this  clinic on April 08, 2008, after followup from hospitalization for stroke  in October 2008.  During last clinic visit, we had tried to set up  resumption of home health therapy, but unfortunately that was not  completed possibly related to an incorrect phone number.  She is doing  only minimal blocking at the present time and reports that she still has  had pain, not relieved by the Vicodin and methadone.  She has been using  the hydrocodone 5/325 approximately 3-6 tablets per day.  She does live  at her home in [place] with her daughter providing around the clock care.   REVIEW OF SYSTEMS:  Positive for skin rash, constipation, diarrhea,  urinary retention, coughing and wheezing.   MEDICATIONS:  1. Aggrenox daily.  2. Glipizide daily.  3. Zoloft daily.  4. Diazepam 10 mg nightly.  5. Methadone 5 mg 3 tablets t.i.d.  6. Vicodin 5/325 one to two tablets p.o. t.i.d. p.r.n.  7. Vitamin E daily.   PHYSICAL EXAMINATION:  GENERAL:  Reasonably well-appearing, elderly  adult female, seated in a manual wheelchair.  VITAL SIGNS:  Blood pressure 154/48 with a pulse of 61, respiratory rate  18 and O2 saturation 95% on room air.  EXTREMITIES:  She has 4+/5 strength throughout the left upper and left  lower extremity.  Right-sided strength was generally 3+/5.   IMPRESSION:  1. Status post left middle cerebral artery infarct with right      hemiparesis.  2. Chronic pain related to prior compression fractures and stroke.  3. History of tobacco abuse.  4. History of arrhythmia.  5. History of recurrent urinary tract infection.  6. Type 2 diabetes mellitus.  7. Hypertension.  8. Chronic obstructive pulmonary disease.  9. History of frequent urinary tract infections with hospitalizations.  10.Pneumonia.   In the office today, we did adjust the patient's  hydrocodone up to  10/325 strength 1 tablet p.o. 5 times per day p.r.n.  We also refilled  her methadone without change a total of 270 tablets as of today.  If the  patient does not improve her pain, we will make further adjustments.  Also, we are hoping to get better sleep with the adjustment of her pain  medicines, but if that is not the case then we will need to adjust her  medicines for insomnia.  We will plan on seeing the patient in followup  in this office in approximately 3 months' time, and will make a referral  for recurrent repeat home health therapies through [facility].          ______________________________  Ellwood Dense, M.D.    DC/MedQ  D:  07/05/2008 15:51:16  T:  07/06/2008 05:40:11  Job #:  [id number]

## 2011-02-09 NOTE — Assessment & Plan Note (Signed)
Left MCA distribution infarct, right hemiparesis and tooth pain  September 2008.  She has gone through inpatient rehabilitation.   PAST MEDICAL HISTORY:  Significant for diabetes, chronic low back pain,  COPD, gallstones, dyslipidemia, recurrent UTIs.   She is unable to void for UDS today.  Her average pain is 8/10,  currently 4/10.  Pain is described as dull, constant aching.  She has  both pain over the right hemibody but also over the low back area.   Her pain is worse with walking and inactivity improves with heat  medications.  Relief from meds is good.  She is a wheelchair.  She does  not climb steps, does not drive.  She needs assist with dressing,  bathing, toileting, meal prep, household duties, and shopping, but this  is mainly due to CVA.   MEDICAL REVIEW OF SYSTEMS:  Positive for bowel and bladder control  problems, trouble walking, confusion, depression, anxiety, abdominal  pain, difficulty swallowing, and coughing.   Review of her last MRI which is performed demonstrated severe spinal  stenosis L4-L5 and moderate spinal stenosis at L3-L4.  This was  performed on October 12, 2006, without contrast enhancement.   She states that she was considered for neurosurgical intervention, but  not felt to be a good operative candidate.   CURRENT MEDICATIONS:  1. Methadone 15 mg t.i.d.  2. Vicodin 10/325.  She takes this 5 times a day.   PHYSICAL EXAMINATION:  VITAL SIGNS:  Blood pressure 123/36, pulse is 61,  respirations 18, and O2 96% on room air.  GENERAL:  Elderly female in no distress.  No acute distress.  Orientation x3.  NEUROLOGIC:  Right hemiparesis on examination.  Gait is unsteady less to  the right side.   IMPRESSION:  1. Lumbar spinal stenosis.  2. 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   PLAN:  We will continue Vicodin.  We will start reducing methadone due  to potential interactions and cardiac effects explained to the patient.  He is on multiple medications for his CVD,COPD . We will start to wean.  I will see her back in about 1 month.      Erick Colace, M.D.  Electronically Signed     AEK/MedQ  D:  12/02/2008 17:28:43  T:  12/03/2008 04:58:36  Job #:  [id number]

## 2011-02-09 NOTE — H&P (Signed)
NAME:  [name], [name] NO.:  [id number]   MEDICAL RECORD NO.:  [id number]          PATIENT TYPE:  INP   LOCATION:  5151                         FACILITY:  [facility]   PHYSICIAN:  Lonia Blood, M.D.       DATE OF BIRTH:  1930/02/16   DATE OF ADMISSION:  10/04/2007  DATE OF DISCHARGE:                              HISTORY & PHYSICAL   PRIMARY CARE PHYSICIAN:  Dr. Aida Puffer, [place], [place].   CHIEF COMPLAINTS:  Chills.   HISTORY OF PRESENT ILLNESS:  [name] is a 77 year old woman with a  past medical history of stroke back in September 2008 that left the  patient with right-sided hemiparesis and intermittent confusion.  The  patient was brought in today to the emergency room after her family  noticed that she was more confused and coughing.  The patient is  complaining of some chills.  She denies any chest pain.  She denies  making any sputum.  She continues to smoke a few cigarettes every day.  She denies being very short of breath.   PAST MEDICAL HISTORY:  1. Left MCA infarct September 2008.  2. COPD.  3. Chronic pain syndrome.  4. Chronic back pain.  5. Hypertension.  6. Diabetes mellitus type 2.  7. Gallstones.  8. History of a partial small-bowel obstruction.  9. Morbid obesity.   HOME MEDICATIONS:  Furosemide, promethazine, diltiazem, omeprazole,  methadone, glipizide, Spiriva, Aggrenox, Imdur, Januvia, metoprolol,  potassium, Valium, folic acid, MiraLax and vitamins.   ALLERGIES:  NO KNOWN DRUG ALLERGIES.   SOCIAL HISTORY:  The patient lives with her daughter.  She is retired.  She still smokes a few cigarettes a day.  Does not drink alcohol.   FAMILY HISTORY:  Positive for diabetes.   REVIEW OF SYSTEMS:  As per HPI.  Also positive for some dysuria.  All  other systems reviewed and are negative.   PHYSICAL EXAMINATION:  VITAL SIGNS:  Upon admission, temperature 97.6,  blood pressure 150/62, pulse 57, respirations 19, saturation 94% on  room  air.  GENERAL APPEARANCE:  Morbidly-obese woman lying on a stretcher in no  acute distress, alert and oriented to place, not to time.  She is  appropriate, answering all questions.  Short-term memory is poor.  HEENT:  Throat clear.  NECK:  Supple.  No JVD.  CHEST:  Bilateral rhonchi, right greater than left.  No wheezes, no  crackles.  HEART:  Regular rate and rhythm without murmurs, rubs or gallop.  ABDOMEN:  The patient's abdomen is obese and soft, nontender.  Bowel  sounds are present.  EXTREMITIES:  Lower Extremities without edema.  SKIN:  Warm, dry without any suspicious rashes.  NEUROLOGICAL:  Upper extremities about 4/5 strength bilaterally with  left stronger than right.  Lower:  Gait has not been tested.  Sensation  seems to be intact.  Speech seems to be borderline dysarthric.  No clear  aphasia.   LABORATORY VALUES:  At the time of admission, sodium 132, potassium 3.9,  chloride 99, bicarb 24, BUN 27, creatinine 1.2, glucose 127, calcium  9.3, albumin 3.2, AST 16, white blood cell count 10.3, hemoglobin 13.9,  platelet count 364,000.   Portable chest x-ray shows a right lower lobe infiltrate.   ASSESSMENT AND PLAN:  Altered mental status most likely secondary to a  right lower lobe pneumonia.  Also, the patient is on a number of  sedating medications including methadone and Valium.  I would  discontinue promethazine, methadone and Valium for now, and will monitor  the patient closely to see if her mental status remains altered.  The  head CT was obtained and does not show an acute stroke.  If the patient  remains with intermittent confusion, an MRI of the brain or repeat CT  scan might be warranted.  For now, will treat the patient with Avelox  intravenously, check a blood culture, check urine culture.  I would also  test the patient with speech swallowing evaluation to assure she does  not have dysphagia.  Physical therapy and occupational therapy will be   invited.  The patient's diltiazem, metoprolol, Aggrenox and Imdur will  be continued.  While inhouse, the patient will be kept on sliding scale  insulin.      Lonia Blood, M.D.  Electronically Signed     SL/MEDQ  D:  10/04/2007  T:  10/05/2007  Job:  [id number]   cc:   Aida Puffer

## 2011-02-09 NOTE — H&P (Signed)
[name], ALKEMA NO.:  [id number]   MEDICAL RECORD NO.:  [id number]          PATIENT TYPE:  IPS   LOCATION:  4032                         FACILITY:  [facility]   PHYSICIAN:  Ranelle Oyster, M.D.DATE OF BIRTH:  1930/02/09   DATE OF ADMISSION:  06/16/2007  DATE OF DISCHARGE:                              HISTORY & PHYSICAL   CHIEF COMPLAINT:  Right-sided weakness.   HISTORY OF PRESENT ILLNESS:  This is a pleasant 77 year old white  female, with a history of prior stroke in January of this year, who was  admitted on September 15 with new right-sided weakness.  MRI revealed an  acute infarct in the left MCA territory.  MRA revealed a focal occlusion  in the angular branch of the left MCA.  No carotid stenosis was found.  Echocardiogram was essentially negative for thrombus or source of  embolus.  The patient was placed on Aggrenox for stroke prophylaxis.  The patient continues to struggle with right-sided weakness but has been  making some motoric progress and, thus, is admitted to in-patient rehab;  admitted today to improve upon her functional and physical deficits.   PAST MEDICAL HISTORY:  Notable for:  1. Noninsulin-requiring diabetes.  2. Chronic low back pain.  3. COPD.  The patient is on home O2 at night.  4. Hypertension.  5. Arrhythmia.  6. Gallstones.  7. History of incised lipids.  8. Small bowel obstructions.  9. History of  recurrent UTI.  10.Thrombocytopenia.  11.Chronic tobacco use.  She does not smoke.   FAMILY HISTORY:  Positive for CAD.   SOCIAL HISTORY:  The patient lives with her daughter who can assist her  as needed.  She has a one-level house with no steps to enter.  The  patient was independent prior to arrival and driving.  Currently, the  patient is requiring mild to max assist for basic mobility and self-  care.   REVIEW OF SYSTEMS:  Notable for:  GASTROINTESTINAL:  Nausea.  She did  move her bowels yesterday.  She has had  some urinary frequency but does  have that at home.  CONSTITUTIONAL:  She reports congestion and some  coughing.  She feels that she has contracted a head cold since being in  the hospital.  She does have chronic cough, intermittently.  She does  report some wheezing as well.  REMAINDER:  Full review is in the written  H&P.   CHIEF COMPLAINT:  1. Lovenox 40 mg q.d.  2. Protonix 40 mg q.d.  3. Albuterol q.6h. nebulizer.  4. Atrovent q.6h. nebulizer.  5. Insulin, sliding scale.  6. Foltx multiple vitamin daily.  7. Aggrenox one p.o. q. H.s.  8. Zocor 40 mg q.d.  9. Nicotine patch daily.  10.Glucotrol XL 10 mg daily.  11.Diltiazem 240 mg q.d.  12.Ursodiol 300 mg b.i.d.  13.Methadone 15 mg q.8h.  14.Pyridium 200 mg t.i.d.  15.Prednisone taper.  16.Ceftriaxone IV 1 gram q.24h.  17.P.r.n. Phenergan.  18.Zofran.  19.Ventolin.  20.Atrovent.  21.Hydromorphone.  22.As well as p.r.n. Vicodin.   ALLERGIES:  NONE.   LABORATORY  DATA:  Sodium 137, potassium 4.3, BUN 8, creatinine 0.73.  CBC notable for hemoglobin 13.4, hematocrit 39.9, white count 9.4,  platelets 456,000.  She had a urine culture, finalized yesterday which  was negative.   PHYSICAL EXAMINATION:  VITAL SIGNS:  Blood pressure is 173/77,  temperature 98.5, respiratory rate is 16, pulse is 88.  GENERAL:  The patient is pleasant, alert and oriented x3.  HEENT:  Pupils are equal, round, and reactive to light.  Oral mucosa is  generally pink and moist with fair dentition.  The tongue is pink.  NECK:  Supple without JVD and lymphadenopathy.  CHEST:  Notable for expiratory wheezes and some crackles at the bases.  She has an audible wet cough as well.  HEART:  Regular rate and rhythm without murmur, rub or gallops.  ABDOMEN:  Soft, nontender, bowel sounds are positive.  SKIN:  Generally, skin was intact with no signs of breakdown.  EXTREMITIES:  She had trace edema in the lower extremities.  NEUROLOGICAL:  Cranial nerves  II-XII are intact except for a small right  central VII.  Reflexes were 1+ but sensation is grossly intact in all 4  extremities.  Motor function is notable for 2/5 strength in the right  upper extremity which was slightly variable today.  This was an  improvement from my last exam yesterday.  The right lower extremity was  2 to 2+/5.  PSYCHIATRIC:  Judgment was good.  Orientation was within normal limits.  Memory was appropriate.  Mood was very pleasant.  Speech is dysarthric.   ASSESSMENT/PLAN:  1. Functional deficits, secondary to left middle cerebral artery      stroke and right hemiparesis. Begin comprehensive in-patient      rehabilitation with physical therapy to assess and treat for      mobility, range of motion, and gait.  Occupational therapy will      assess for upper extremities, activities of daily living, and      safety.  The rehabilitation nurse will follow on a 24-hour basis      for bowel, bladder, skin and medication issues.  The rehabilitation      case manager/social worker will assess for psychosocial needs and      discharge planning.  Estimated length of stay is 2+ weeks.      Prognosis fair to good.  Goal is supervision.  The patient is      making nice motoric gains.  2. Deep venous thrombosis prophylaxis, subcu Lovenox.  3. Stroke anticoagulation with Aggrenox.  4. History of pyuria. The patient is on Rocephin 1 gram intravenous      q.24h.  I do not see a real indication for this at this point.  I      would consider re-sampling urine.  Discontinue antibiotics.      Continue Pyridium for symptoms.  Observe the patient clinically      thereafter.  5. Nausea.  P.r.n. Phenergan.  Observe clinically.  It may be really      associated with her intravenous Rocephin.  6. Diabetes.  Continue with glipizide sliding scale insulin.  Observe      as steroids are tapered off.  7. Pulmonary.  Continue steroid taper, as well as nebulizer      treatments.  Continue O2 at  night per home regimen.  8. Hypertension.  Continue diltiazem 240 mg q.d.      Ranelle Oyster, M.D.  Electronically Signed     ZTS/MEDQ  D:  06/16/2007  T:  06/17/2007  Job:  [id number]

## 2011-02-09 NOTE — Assessment & Plan Note (Signed)
HISTORY:  [name] returns to clinic today for followup pain control.  The patient is a 77 year old female with a history of prior stroke in  January 2008.  She was admitted on June 12, 2007, with acute onset  of right-sided weakness.  MRI study showed an acute infarct in the left  middle cerebral artery territory.  MRA study revealed occlusion of the  upper branch of the left middle cerebral artery.  Carotid Doppler showed  no internal carotid artery stenosis.  She was placed on Aggrenox for  stroke prophylaxis.   The patient was moved to Rehabilitation Unit on June 16, 2007, and  remained there through discharge on July 05, 2007.   Since discharge, the patient did not follow up secondary to insurance  issues and transportation issues.  She was initially seeing her primary  care physician, Dr. Jeannetta Nap.  They were not happy with care and they  subsequently started seeing Dr. Aida Puffer.  They are being told by  Dr. Fredirick Maudlin office that he cannot prescribe her Vicodin or methadone  for her any longer.  He apparently was telling it between October 2008  and May 2009.   The patient has a history of chronic pain, especially of her right side  and low back.  She has a history of prior compression fracture with the  new onset right-sided pain after her stroke.  She also has some minimal  pain in her left upper extremity.  She has had a long history of using  methadone and Vicodin as noted below.  She would like to start back on  those supply through this office since her primary care physician will  not fill those for her any longer.   The patient has finished home health therapies months ago.  She has not  started any outpatient therapies, but would like to restart home health  therapies.  She is apparently walking with a walker inside the home and  independent with bathing and dressing at this point.   REVIEW OF SYSTEMS:  Noncontributory.   MEDICATIONS:  Aggrenox daily,  glipizide daily, Zoloft, diazepam,  methadone, Vicodin, vitamin E, and Cartia.   PHYSICAL EXAMINATION:  GENERAL:  Reasonably well-appearing elderly adult  female seated in a manual wheelchair.  VITAL SIGNS:  Blood pressure is 121/52 with pulse of 72, respiratory  rate 18, and O2 saturation 95% on room air.  The family reports blood  sugars of 92-110.  She has 4+/5 strength in the left upper and left  lower extremity.  Right-sided strength was generally 3+/5.   IMPRESSION:  1. Status post left middle cerebral artery infarct with right      hemiparesis.  2. Chronic pain related to prior compression fractures and stroke.  3. History of tobacco abuse.  4. History of arrhythmia.  5. History of recurrent urinary tract infection.  6. Type 2 diabetes mellitus.  7. Hypertension.  8. Chronic obstructive pulmonary disease.  9. History of frequent urinary tract infections with hospitalization.  10.Pneumonia.   PLAN:  In the office today, we did refill the patient's methadone 5 mg  to be as 3 tablets per total of 15 mg 3 times per day.  We also refilled  her hydrocodone in the office today.  She understands that all pain  medicines needed to be obtained through this office and that she needs  to call in several days in advance to get the refills.  She or a family  member can pick up  the scripts from office when she is not scheduled for  an office visit.  We will plan on seeing her  in followup in approximately 2 months' time.  We have made a referral to  advance to home health to retry therapies with her at home.           ______________________________  Ellwood Dense, M.D.     DC/MedQ  D:  04/08/2008 12:13:32  T:  04/09/2008 07:32:54  Job #:  [id number]

## 2011-02-12 NOTE — Op Note (Signed)
NAME:  [name], [name]                         ACCOUNT NO.:  [id number]   MEDICAL RECORD NO.:  [id number]                   PATIENT TYPE:  AMB   LOCATION:  ENDO                                 FACILITY:  [facility]   PHYSICIAN:  Florencia Reasons, M.D.             DATE OF BIRTH:  March 22, 1930   DATE OF PROCEDURE:  05/14/2002  DATE OF DISCHARGE:                                 OPERATIVE REPORT   PROCEDURE:  Colonoscopy with polypectomy and biopsies.   INDICATIONS:  A 77 year old female, now about three years status post  colonoscopy performed because of heme-positive stool, at which time  approximately eight polyps were removed, roughly half of which were  adenomatous in character.   FINDINGS:  Multiple polyps (approximately 15) removed.   DESCRIPTION OF PROCEDURE:  The nature, purpose, and risks of the procedure  were familiar to the patient from prior examination, and she provided  written consent.  Sedation prior to and during the course of the procedure  was fentanyl 70 mcg and Versed 7 mg IV without arrhythmias or desaturation.   The Olympus adult video colonoscope was inserted and advanced without much  difficulty around the colon to the cecum, and pullback was then performed.  The cecum was identified by visualization of the appendiceal orifice.   During this exam, approximately 15 polyps were removed, varying in size from  2 mm sessile, hyperplastic-appearing polyps removed by cold biopsy technique  to a fairly large number of 4-5 mm sessile pale polyps removed by snare  technique or sampled by biopsy forceps (usually to the point of excision  macroscopically) to a couple of medium-sized rectal polyps approximately 8  mm in diameter, erythematous and semipedunculated in character.   The polyps were located all the way around the colon.  None was larger than  a centimeter.  None looked cancerous or worrisome grossly.   No colitis, vascular malformations, or diverticulosis were  noted.   Retroflexion in the rectum was unremarkable, and reinspection of the  rectosigmoid disclosed no additional findings.   The patient tolerated the procedure well, and there were no apparent  complications.   IMPRESSION:  Numerous colon polyps, removed as described above.   PLAN:  1. Await pathology on polyps.  2. The patient's daughter indicates that she had Hemoccult-positive stool     noted through the office of her primary physician, Dr. Jeannetta Nap.  I have     advised the patient and her daughter that it would be prudent to have a     repeat set of Hemoccults done, since the patient's multiple polyps     (especially the distal rectal erythematous polyps) might readily account     for the heme-positive stool but do not necessarily account for that     finding and if the patient were to be persistently heme-positive     following removal of these polyps, consideration might need to be  given     to endoscopic evaluation.  The patient indicates that she goes to see Dr.     Jeannetta Nap in the near future, and this issue could be addressed at that time     in his office.   In view of  the large number of polyps removed today, I would anticipate  relatively early colonoscopic follow-up, probably one the order of one to  two years from now.                                                Florencia Reasons, M.D.    RVB/MEDQ  D:  05/14/2002  T:  05/15/2002  Job:  [id number]   cc:   Windle Guard, M.D.

## 2011-06-17 LAB — CBC
HCT: 33.6 — ABNORMAL LOW
HCT: 34.2 — ABNORMAL LOW
HCT: 41.5
Hemoglobin: 13.9
MCV: 95
MCV: 95.3
Platelets: 334
Platelets: 340
Platelets: 343
RBC: 4.35
RDW: 13.7
WBC: 10.3
WBC: 8.3
WBC: 8.4

## 2011-06-17 LAB — URINE CULTURE

## 2011-06-17 LAB — CULTURE, BLOOD (ROUTINE X 2): Culture: NO GROWTH

## 2011-06-17 LAB — BASIC METABOLIC PANEL
BUN: 14
BUN: 14
BUN: 18
Creatinine, Ser: 1
Creatinine, Ser: 1.05
Creatinine, Ser: 1.08
GFR calc non Af Amer: 49 — ABNORMAL LOW
GFR calc non Af Amer: 51 — ABNORMAL LOW
GFR calc non Af Amer: 54 — ABNORMAL LOW
Glucose, Bld: 129 — ABNORMAL HIGH
Potassium: 3.7
Potassium: 4.3

## 2011-06-17 LAB — URINALYSIS, ROUTINE W REFLEX MICROSCOPIC
Ketones, ur: NEGATIVE
Leukocytes, UA: NEGATIVE
Nitrite: POSITIVE — AB
Protein, ur: NEGATIVE
pH: 5

## 2011-06-17 LAB — COMPREHENSIVE METABOLIC PANEL
AST: 16
BUN: 27 — ABNORMAL HIGH
CO2: 24
Chloride: 99
Creatinine, Ser: 1.22 — ABNORMAL HIGH
GFR calc Af Amer: 52 — ABNORMAL LOW
GFR calc non Af Amer: 43 — ABNORMAL LOW
Glucose, Bld: 127 — ABNORMAL HIGH
Total Bilirubin: 0.7

## 2011-06-17 LAB — DIFFERENTIAL
Basophils Absolute: 0
Eosinophils Relative: 2
Lymphocytes Relative: 21
Neutrophils Relative %: 71

## 2011-06-17 LAB — B-NATRIURETIC PEPTIDE (CONVERTED LAB): Pro B Natriuretic peptide (BNP): <30

## 2011-06-21 LAB — URINE CULTURE: Colony Count: >=100000

## 2011-06-21 LAB — DIFFERENTIAL
Basophils Relative: 1
Eosinophils Absolute: 0.4
Monocytes Relative: 6
Neutrophils Relative %: 60

## 2011-06-21 LAB — COMPREHENSIVE METABOLIC PANEL
ALT: <8
Alkaline Phosphatase: 51
CO2: 25
Chloride: 108
GFR calc non Af Amer: 59 — ABNORMAL LOW
Glucose, Bld: 97
Potassium: 3.8
Sodium: 138

## 2011-06-21 LAB — URINALYSIS, ROUTINE W REFLEX MICROSCOPIC
Hgb urine dipstick: NEGATIVE
Protein, ur: NEGATIVE
Urobilinogen, UA: 0.2

## 2011-06-21 LAB — POCT I-STAT 3, ART BLOOD GAS (G3+)
Acid-base deficit: 1
Bicarbonate: 24.1 — ABNORMAL HIGH
Operator id: 299431
pCO2 arterial: 37.5
pO2, Arterial: 117 — ABNORMAL HIGH

## 2011-06-21 LAB — CBC
Hemoglobin: 11.8 — ABNORMAL LOW
RBC: 3.81 — ABNORMAL LOW

## 2011-06-21 LAB — ETHANOL: Alcohol, Ethyl (B): <5

## 2011-06-21 LAB — URINE MICROSCOPIC-ADD ON

## 2011-06-24 LAB — DIFFERENTIAL
Basophils Absolute: 0.1
Basophils Relative: 1
Eosinophils Absolute: 0.1
Neutro Abs: 7.2
Neutrophils Relative %: 61

## 2011-06-24 LAB — URINALYSIS, ROUTINE W REFLEX MICROSCOPIC
Bilirubin Urine: NEGATIVE
Glucose, UA: NEGATIVE
Hgb urine dipstick: NEGATIVE
Protein, ur: 30 — AB
Specific Gravity, Urine: 1.017
Urobilinogen, UA: 0.2

## 2011-06-24 LAB — CBC
MCHC: 33.7
MCV: 90.5
Platelets: 336
RDW: 15

## 2011-06-24 LAB — BASIC METABOLIC PANEL
BUN: 18
CO2: 26
Calcium: 9.5
Creatinine, Ser: 0.74
GFR calc Af Amer: >60
Glucose, Bld: 98

## 2011-06-24 LAB — POCT CARDIAC MARKERS
CKMB, poc: <1 — ABNORMAL LOW
Myoglobin, poc: 44.9

## 2011-06-24 LAB — URINE MICROSCOPIC-ADD ON

## 2011-07-08 LAB — DIFFERENTIAL
Basophils Absolute: 0
Basophils Absolute: 0.1
Basophils Relative: 0
Basophils Relative: 1
Eosinophils Absolute: 0.1
Eosinophils Absolute: 0.1
Eosinophils Absolute: 0.1
Eosinophils Relative: 1
Eosinophils Relative: 1
Eosinophils Relative: 2
Lymphocytes Relative: 18
Lymphs Abs: 1.7
Lymphs Abs: 2.3
Monocytes Absolute: 0.7
Monocytes Absolute: 1 — ABNORMAL HIGH
Monocytes Relative: 7
Monocytes Relative: 8
Neutro Abs: 6.9
Neutro Abs: 9.1 — ABNORMAL HIGH
Neutrophils Relative %: 72

## 2011-07-08 LAB — URINE MICROSCOPIC-ADD ON

## 2011-07-08 LAB — CK TOTAL AND CKMB (NOT AT ARMC)
CK, MB: 0.9
Relative Index: INVALID
Relative Index: INVALID
Total CK: 26
Total CK: 32
Total CK: 35

## 2011-07-08 LAB — BASIC METABOLIC PANEL
BUN: 32 — ABNORMAL HIGH
CO2: 28
CO2: 33 — ABNORMAL HIGH
Calcium: 8 — ABNORMAL LOW
Calcium: 8.6
Calcium: 9.1
Calcium: 9.2
Chloride: 100
Creatinine, Ser: 1.04
Creatinine, Ser: 1.12
GFR calc Af Amer: 57 — ABNORMAL LOW
GFR calc Af Amer: >60
GFR calc non Af Amer: 52 — ABNORMAL LOW
GFR calc non Af Amer: >60
Glucose, Bld: 54 — ABNORMAL LOW
Glucose, Bld: 67 — ABNORMAL LOW
Potassium: 3.2 — ABNORMAL LOW
Potassium: 3.6
Sodium: 133 — ABNORMAL LOW
Sodium: 138
Sodium: 141

## 2011-07-08 LAB — URINALYSIS, ROUTINE W REFLEX MICROSCOPIC
Bilirubin Urine: NEGATIVE
Bilirubin Urine: NEGATIVE
Hgb urine dipstick: NEGATIVE
Hgb urine dipstick: NEGATIVE
Nitrite: NEGATIVE
Nitrite: NEGATIVE
Protein, ur: NEGATIVE
Specific Gravity, Urine: 1.007
Specific Gravity, Urine: 1.01
Urobilinogen, UA: 1
Urobilinogen, UA: 1
Urobilinogen, UA: 4 — ABNORMAL HIGH
pH: 6
pH: 7.5

## 2011-07-08 LAB — CBC
HCT: 39.9
HCT: 43.5
Hemoglobin: 13.1
Hemoglobin: 13.4
Hemoglobin: 14.6
MCHC: 33.6
MCHC: 33.6
MCHC: 33.6
MCV: 96.3
MCV: 96.7
Platelets: 456 — ABNORMAL HIGH
Platelets: 505 — ABNORMAL HIGH
RBC: 4.13
RBC: 4.44
RBC: 4.52
RDW: 14.1 — ABNORMAL HIGH
RDW: 14.2 — ABNORMAL HIGH
WBC: 9.4
WBC: 9.5

## 2011-07-08 LAB — TROPONIN I: Troponin I: 0.02

## 2011-07-08 LAB — COMPREHENSIVE METABOLIC PANEL
ALT: 21
AST: 22
Albumin: 2.5 — ABNORMAL LOW
Alkaline Phosphatase: 46
BUN: 8
CO2: 29
CO2: 30
Calcium: 9
Chloride: 103
GFR calc Af Amer: >60
GFR calc non Af Amer: >60
Glucose, Bld: 182 — ABNORMAL HIGH
Potassium: 4.3
Sodium: 135
Total Bilirubin: 0.6
Total Protein: 6.1

## 2011-07-08 LAB — LIPID PANEL
HDL: 39 — ABNORMAL LOW
Total CHOL/HDL Ratio: 4.7

## 2011-07-08 LAB — DIGOXIN LEVEL: Digoxin Level: <0.2 — ABNORMAL LOW

## 2011-07-08 LAB — BASIC METABOLIC PANEL WITH GFR
BUN: 13
Chloride: 94 — ABNORMAL LOW
GFR calc non Af Amer: 47 — ABNORMAL LOW
Glucose, Bld: 212 — ABNORMAL HIGH
Potassium: 2.8 — ABNORMAL LOW

## 2011-07-08 LAB — URINE CULTURE

## 2011-07-08 LAB — PROTIME-INR
INR: 1
Prothrombin Time: 13.3

## 2011-07-08 LAB — PREALBUMIN: Prealbumin: 26.1

## 2011-07-08 LAB — HEMOGLOBIN A1C
Hgb A1c MFr Bld: 6.8 — ABNORMAL HIGH
Mean Plasma Glucose: 165

## 2011-07-08 LAB — TSH: TSH: 2.542

## 2011-07-08 LAB — APTT: aPTT: 26

## 2011-10-01 ENCOUNTER — Emergency Department (HOSPITAL_COMMUNITY): Payer: Medicare Other

## 2011-10-01 ENCOUNTER — Encounter (HOSPITAL_COMMUNITY): Payer: Self-pay | Admitting: Emergency Medicine

## 2011-10-01 ENCOUNTER — Emergency Department (HOSPITAL_COMMUNITY)
Admission: EM | Admit: 2011-10-01 | Discharge: 2011-10-02 | Disposition: A | Discharge status: Home / Self Care | Payer: Medicare Other | Attending: Emergency Medicine | Admitting: Emergency Medicine

## 2011-10-01 DIAGNOSIS — I482 Chronic atrial fibrillation, unspecified: Secondary | ICD-10-CM

## 2011-10-01 DIAGNOSIS — Z8679 Personal history of other diseases of the circulatory system: Secondary | ICD-10-CM | POA: Insufficient documentation

## 2011-10-01 DIAGNOSIS — F411 Generalized anxiety disorder: Secondary | ICD-10-CM | POA: Insufficient documentation

## 2011-10-01 DIAGNOSIS — F419 Anxiety disorder, unspecified: Secondary | ICD-10-CM

## 2011-10-01 DIAGNOSIS — R109 Unspecified abdominal pain: Secondary | ICD-10-CM

## 2011-10-01 DIAGNOSIS — E119 Type 2 diabetes mellitus without complications: Secondary | ICD-10-CM | POA: Insufficient documentation

## 2011-10-01 DIAGNOSIS — I4891 Unspecified atrial fibrillation: Secondary | ICD-10-CM | POA: Insufficient documentation

## 2011-10-01 DIAGNOSIS — R05 Cough: Secondary | ICD-10-CM | POA: Insufficient documentation

## 2011-10-01 DIAGNOSIS — R1031 Right lower quadrant pain: Secondary | ICD-10-CM | POA: Insufficient documentation

## 2011-10-01 DIAGNOSIS — J4489 Other specified chronic obstructive pulmonary disease: Secondary | ICD-10-CM | POA: Insufficient documentation

## 2011-10-01 DIAGNOSIS — R509 Fever, unspecified: Secondary | ICD-10-CM | POA: Insufficient documentation

## 2011-10-01 DIAGNOSIS — I10 Essential (primary) hypertension: Secondary | ICD-10-CM | POA: Insufficient documentation

## 2011-10-01 DIAGNOSIS — I509 Heart failure, unspecified: Secondary | ICD-10-CM | POA: Insufficient documentation

## 2011-10-01 DIAGNOSIS — R259 Unspecified abnormal involuntary movements: Secondary | ICD-10-CM | POA: Insufficient documentation

## 2011-10-01 DIAGNOSIS — J449 Chronic obstructive pulmonary disease, unspecified: Secondary | ICD-10-CM | POA: Insufficient documentation

## 2011-10-01 DIAGNOSIS — R059 Cough, unspecified: Secondary | ICD-10-CM | POA: Insufficient documentation

## 2011-10-01 HISTORY — DX: Heart failure, unspecified: I50.9

## 2011-10-01 HISTORY — DX: Essential (primary) hypertension: I10

## 2011-10-01 HISTORY — DX: Unspecified atrial fibrillation: I48.91

## 2011-10-01 HISTORY — DX: Dorsalgia, unspecified: M54.9

## 2011-10-01 HISTORY — DX: Cerebral infarction, unspecified: I63.9

## 2011-10-01 LAB — CBC
MCH: 28.6 pg (ref 26.0–34.0)
MCHC: 32.2 g/dL (ref 30.0–36.0)
RDW: 14.7 % (ref 11.5–15.5)

## 2011-10-01 LAB — BASIC METABOLIC PANEL
BUN: 18 mg/dL (ref 6–23)
Chloride: 102 mEq/L (ref 96–112)
Creatinine, Ser: 0.75 mg/dL (ref 0.50–1.10)
GFR calc Af Amer: >90 mL/min (ref >90)
GFR calc non Af Amer: 78 mL/min — ABNORMAL LOW (ref >90)
Potassium: 2.8 mEq/L — ABNORMAL LOW (ref 3.5–5.1)

## 2011-10-01 LAB — URINALYSIS, ROUTINE W REFLEX MICROSCOPIC
Bilirubin Urine: NEGATIVE
Ketones, ur: NEGATIVE mg/dL
Leukocytes, UA: NEGATIVE
Nitrite: NEGATIVE
Specific Gravity, Urine: 1.027 (ref 1.005–1.030)
Urobilinogen, UA: 1 mg/dL (ref 0.0–1.0)
pH: 6 (ref 5.0–8.0)

## 2011-10-01 LAB — HEPATIC FUNCTION PANEL
ALT: 6 U/L (ref 0–35)
AST: 11 U/L (ref 0–37)
Alkaline Phosphatase: 54 U/L (ref 39–117)
Total Protein: 7.1 g/dL (ref 6.0–8.3)

## 2011-10-01 LAB — DIFFERENTIAL
Basophils Absolute: 0.1 10*3/uL (ref 0.0–0.1)
Basophils Relative: 0 % (ref 0–1)
Eosinophils Absolute: 0.4 10*3/uL (ref 0.0–0.7)
Neutro Abs: 10.4 10*3/uL — ABNORMAL HIGH (ref 1.7–7.7)
Neutrophils Relative %: 71 % (ref 43–77)

## 2011-10-01 LAB — URINE MICROSCOPIC-ADD ON

## 2011-10-01 MED ORDER — LORAZEPAM 2 MG/ML IJ SOLN
1.0000 mg | Freq: Once | INTRAMUSCULAR | Status: AC
  Administered 2011-10-01: 1 mg via INTRAVENOUS
  Filled 2011-10-01: qty 1

## 2011-10-01 MED ORDER — SODIUM CHLORIDE 0.9 % IV SOLN
Freq: Once | INTRAVENOUS | Status: AC
  Administered 2011-10-01: via INTRAVENOUS

## 2011-10-01 NOTE — ED Notes (Signed)
Pt via [facility] from home with c/o low grade fever and congestion.

## 2011-10-01 NOTE — ED Notes (Signed)
Pt unable to void at this time. 

## 2011-10-01 NOTE — ED Notes (Signed)
Pt here with c/o mild productive cough. Noted in no acute distress. Resp are unlabored. Skin warm and dry. Afebrile.

## 2011-10-01 NOTE — ED Notes (Signed)
Patient transported to X-ray 

## 2011-10-02 ENCOUNTER — Emergency Department (HOSPITAL_COMMUNITY): Payer: Medicare Other

## 2011-10-02 ENCOUNTER — Other Ambulatory Visit: Payer: Self-pay

## 2011-10-02 LAB — TROPONIN I: Troponin I: <0.3 ng/mL (ref <0.30)

## 2011-10-02 MED ORDER — ALPRAZOLAM 0.5 MG PO TABS: 0.5000 mg | ORAL_TABLET | Freq: Every evening | ORAL | Status: AC | PRN

## 2011-10-02 MED ORDER — IOHEXOL 300 MG/ML  SOLN
100.0000 mL | Freq: Once | INTRAMUSCULAR | Status: AC | PRN
  Administered 2011-10-02: 100 mL via INTRAVENOUS

## 2011-10-02 MED ORDER — LORAZEPAM 2 MG/ML IJ SOLN: 1.0000 mg | Freq: Once | INTRAMUSCULAR | Status: DC

## 2011-10-02 MED ORDER — METOPROLOL TARTRATE 25 MG PO TABS
50.0000 mg | ORAL_TABLET | Freq: Once | ORAL | Status: AC
  Administered 2011-10-02: 50 mg via ORAL
  Filled 2011-10-02: qty 2

## 2011-10-02 MED ORDER — IOHEXOL 300 MG/ML  SOLN
20.0000 mL | INTRAMUSCULAR | Status: AC
  Administered 2011-10-01: 20 mL via ORAL

## 2011-10-02 NOTE — ED Notes (Signed)
Pt drinking po contrast for ct. Pt noted in no acute distress. Dtr at bedside.

## 2011-10-02 NOTE — ED Notes (Signed)
Patient transported to CT Report to Lana Rn in CDU.

## 2011-10-02 NOTE — ED Notes (Signed)
Pt states I feel all right now. Denies needs for additional medication. Pt has completed oral contrast. Noted in no acute distress. Resp are unlabored. Skin warm and dry.

## 2011-10-02 NOTE — ED Provider Notes (Signed)
History     CSN: [id number]  Arrival date & time 10/01/11  2052   First MD Initiated Contact with Patient 10/01/11 2143      Chief Complaint  Patient presents with  . Chills    (Consider location/radiation/quality/duration/timing/severity/associated sxs/prior treatment) HPI  History of COPD (States she's on oxygen as needed at home), congestive heart failure, hypertension, CVA, diabetes, atrial fibrillation presents with multiple complaints. The patient states that earlier today she felt very anxious and complains of bilateral upper extremity shaking. She was alert and aware of her surroundings at that time. She denies headache, dizziness, numbness, tingling, weakness of her extremities. Her lower extremity is were not involved. She states she felt anxious but did not fill chest pain or shortness of breath, palpitations. She complains of slightly worse cough and previous. Her daughter states that she's had low-grade fevers at home to 100.1. She denies rash. She does complain of a right lower quadrant abdominal pain. She states that usually she has a fever she is urinary tract infection. She denies hematuria, dysuria, frequency, urgency to urinate. Denies constipation or diarrhea. Denies nausea, vomiting, diaphoresis. She states that she was previously on medication for anxiety but she is unsure which medication.  Abdominal surgeries include hysterectomy-- unsure if she also had appendectomy at that time.   ED Notes, ED Provider Notes from 10/01/11 0000 to 10/01/11 20:54:35       Santiago Bumpers 10/01/2011 20:52      Pt via [facility] from home with c/o low grade fever and congestion.     Past Medical History  Diagnosis Date  . COPD (chronic obstructive pulmonary disease)     from inpatient chart  . CHF (congestive heart failure)   . Hypertension   . Stroke     rt side deficit  . A-fib   . Diabetes mellitus   . Back pain     No past surgical history on file.  No family history on  file.  History  Substance Use Topics  . Smoking status: Not on file  . Smokeless tobacco: Not on file  . Alcohol Use:     OB History    Grav Para Term Preterm Abortions TAB SAB Ect Mult Living                  Review of Systems  All other systems reviewed and are negative.  except as noted HPI   Allergies  Aggrenox  Home Medications   Current Outpatient Rx  Name Route Sig Dispense Refill  . BUSPIRONE HCL 15 MG PO TABS Oral Take 15 mg by mouth daily.      Marland Kitchen CITALOPRAM HYDROBROMIDE 40 MG PO TABS Oral Take 40 mg by mouth daily.      . FUROSEMIDE 80 MG PO TABS Oral Take 80 mg by mouth 2 (two) times daily.      Marland Kitchen LISINOPRIL 20 MG PO TABS Oral Take 10 mg by mouth daily.      Marland Kitchen METFORMIN HCL 500 MG PO TABS Oral Take 1,000 mg by mouth daily with breakfast.      . MORPHINE SULFATE ER 30 MG PO TB12 Oral Take 30 mg by mouth 2 (two) times daily.      Marland Kitchen OMEPRAZOLE 20 MG PO CPDR Oral Take 20 mg by mouth daily.      . OXYCODONE HCL 5 MG PO TABS Oral Take 5 mg by mouth 2 (two) times daily as needed. For pain.     Marland Kitchen  TRAZODONE HCL 150 MG PO TABS Oral Take 150 mg by mouth at bedtime.      . ALPRAZOLAM 0.5 MG PO TABS Oral Take 1 tablet (0.5 mg total) by mouth at bedtime as needed for sleep. 10 tablet 0    BP 195/101  Pulse 87  Temp(Src) 97.7 F (36.5 C) (Oral)  Resp 18  SpO2 98%  Physical Exam  Nursing note and vitals reviewed. Constitutional: She is oriented to person, place, and time. She appears well-developed.  HENT:  Head: Atraumatic.  Mouth/Throat: Oropharynx is clear and moist.  Eyes: Conjunctivae and EOM are normal. Pupils are equal, round, and reactive to light.  Neck: Normal range of motion. Neck supple.  Cardiovascular: Normal rate, regular rhythm, normal heart sounds and intact distal pulses.   Pulmonary/Chest: Effort normal. No respiratory distress. She has wheezes. She has no rales.       Min exp wheeze  Abdominal: Soft. She exhibits no distension. There is  tenderness. There is no rebound and no guarding.       RLQ ttp  Musculoskeletal: Normal range of motion.  Neurological: She is alert and oriented to person, place, and time.  Skin: Skin is warm and dry. No rash noted.  Psychiatric:       Anxious appearing    Date: 10/02/2011  Rate: 113  Rhythm: atrial fibrillation  QRS Axis: normal  Intervals: afib  ST/T Wave abnormalities: nonspecific ST changes unchanged from previous  Conduction Disutrbances:right bundle branch block unchanged from previous  Narrative Interpretation:   Old EKG Reviewed: changes noted atrial fibrillation  ED Course  Procedures (including critical care time)  Labs Reviewed  CBC - Abnormal; Notable for the following:    WBC 14.6 (*)    Platelets 459 (*)    All other components within normal limits  DIFFERENTIAL - Abnormal; Notable for the following:    Neutro Abs 10.4 (*)    All other components within normal limits  BASIC METABOLIC PANEL - Abnormal; Notable for the following:    Potassium 2.8 (*)    Glucose, Bld 189 (*)    GFR calc non Af Amer 78 (*)    All other components within normal limits  URINALYSIS, ROUTINE W REFLEX MICROSCOPIC - Abnormal; Notable for the following:    APPearance CLOUDY (*)    Hgb urine dipstick SMALL (*)    Protein, ur >300 (*)    All other components within normal limits  URINE MICROSCOPIC-ADD ON - Abnormal; Notable for the following:    Squamous Epithelial / LPF MANY (*)    Bacteria, UA MANY (*)    Casts HYALINE CASTS (*)    All other components within normal limits  HEPATIC FUNCTION PANEL - Abnormal; Notable for the following:    Albumin 3.3 (*)    All other components within normal limits  URINE CULTURE   Dg Chest 2 View  10/01/2011  *RADIOLOGY REPORT*  Clinical Data: CHF history, chills.  Diabetes.  CHEST - 2 VIEW  Comparison: 11/07/2010  Findings: Cardiomegaly.  Mild tortuosity to the thoracic aorta with scattered atherosclerotic calcification, similar to prior.   Linear right lung base opacity is favored to represent scarring or atelectasis.  Interstitial prominence.  No pleural effusion or pneumothorax.  Degenerative changes of the right glenohumeral joint and bilateral acromioclavicular joints.  Multilevel degenerative changes of the thoracic spine.  No acute osseous abnormality.  IMPRESSION: Mild right lung base opacity is favored to represent scarring or atelectasis.  Original Report Authenticated  By: Waneta Martins, M.D.     1. Anxiety   2. Abdominal  pain, other specified site   3. Hypertension   4. Atrial fibrillation, chronic     MDM  The patient presents with multiple complaints. She does complain of abdominal pain and has some abdominal tenderness to palpation on exam- RLQ only. She appears to have a chronic leukocytosis which is not particularly worse than usual. Her urine sample was contaminated with many squamous cells but also with bacteria. Sent for culture. She has wheezing on exam but good air movement is not complaining of shortness of breath. She believes this to be her baseline. Patient also states she's feeling anxious and appears anxious here. She is intermittently in afib with RVR which may be playing a role in her feelings of anxiety. HR 80s- low 100s. PO Metoprolol, which may also help with her elevated blood pressure. We'll give her Ativan. Labs reviewed and otherwise unremarkable. CT abdomen and pelvis for assessment of abdominal pain. I do not believe that she has mesenteric ischemia from atrophic fibrillation and embolus. Her pain is very localized to her right lower quadrant. Home if negative for acute process.  D/W Dr. Nicanor Alcon. Troponin, CT A/P pending.        Forbes Cellar, MD 10/02/11 [phone]

## 2011-10-02 NOTE — ED Notes (Signed)
Family at bedside. 

## 2012-01-03 ENCOUNTER — Other Ambulatory Visit: Payer: Self-pay | Admitting: Family Medicine

## 2012-01-03 DIAGNOSIS — R9389 Abnormal findings on diagnostic imaging of other specified body structures: Secondary | ICD-10-CM

## 2012-01-05 ENCOUNTER — Other Ambulatory Visit: Payer: Medicare Other

## 2012-01-07 ENCOUNTER — Other Ambulatory Visit: Payer: Medicare Other

## 2012-01-28 ENCOUNTER — Other Ambulatory Visit: Payer: Medicare Other

## 2012-02-02 ENCOUNTER — Inpatient Hospital Stay: Admission: RE | Admit: 2012-02-02 | Payer: Medicare Other | Source: Ambulatory Visit

## 2012-02-07 ENCOUNTER — Other Ambulatory Visit: Payer: Medicare Other

## 2012-02-14 ENCOUNTER — Ambulatory Visit
Admission: RE | Admit: 2012-02-14 | Discharge: 2012-02-14 | Disposition: A | Discharge status: Home / Self Care | Payer: Medicare Other | Source: Ambulatory Visit | Attending: Family Medicine | Admitting: Family Medicine

## 2012-02-14 DIAGNOSIS — R9389 Abnormal findings on diagnostic imaging of other specified body structures: Secondary | ICD-10-CM

## 2012-07-31 ENCOUNTER — Emergency Department (HOSPITAL_COMMUNITY)
Admission: EM | Admit: 2012-07-31 | Discharge: 2012-07-31 | Disposition: A | Discharge status: Home / Self Care | Payer: Medicare Other | Attending: Emergency Medicine | Admitting: Emergency Medicine

## 2012-07-31 ENCOUNTER — Encounter (HOSPITAL_COMMUNITY): Payer: Self-pay

## 2012-07-31 DIAGNOSIS — M25569 Pain in unspecified knee: Secondary | ICD-10-CM | POA: Insufficient documentation

## 2012-07-31 DIAGNOSIS — I509 Heart failure, unspecified: Secondary | ICD-10-CM | POA: Insufficient documentation

## 2012-07-31 DIAGNOSIS — I69921 Dysphasia following unspecified cerebrovascular disease: Secondary | ICD-10-CM | POA: Insufficient documentation

## 2012-07-31 DIAGNOSIS — F17200 Nicotine dependence, unspecified, uncomplicated: Secondary | ICD-10-CM | POA: Insufficient documentation

## 2012-07-31 DIAGNOSIS — I69998 Other sequelae following unspecified cerebrovascular disease: Secondary | ICD-10-CM | POA: Insufficient documentation

## 2012-07-31 DIAGNOSIS — Z79899 Other long term (current) drug therapy: Secondary | ICD-10-CM | POA: Insufficient documentation

## 2012-07-31 DIAGNOSIS — E119 Type 2 diabetes mellitus without complications: Secondary | ICD-10-CM | POA: Insufficient documentation

## 2012-07-31 DIAGNOSIS — R4182 Altered mental status, unspecified: Secondary | ICD-10-CM | POA: Insufficient documentation

## 2012-07-31 DIAGNOSIS — R531 Weakness: Secondary | ICD-10-CM

## 2012-07-31 DIAGNOSIS — I69920 Aphasia following unspecified cerebrovascular disease: Secondary | ICD-10-CM | POA: Insufficient documentation

## 2012-07-31 DIAGNOSIS — J4489 Other specified chronic obstructive pulmonary disease: Secondary | ICD-10-CM | POA: Insufficient documentation

## 2012-07-31 DIAGNOSIS — R410 Disorientation, unspecified: Secondary | ICD-10-CM

## 2012-07-31 DIAGNOSIS — R5383 Other fatigue: Secondary | ICD-10-CM | POA: Insufficient documentation

## 2012-07-31 DIAGNOSIS — R05 Cough: Secondary | ICD-10-CM | POA: Insufficient documentation

## 2012-07-31 DIAGNOSIS — Z87440 Personal history of urinary (tract) infections: Secondary | ICD-10-CM | POA: Insufficient documentation

## 2012-07-31 DIAGNOSIS — R5381 Other malaise: Secondary | ICD-10-CM | POA: Insufficient documentation

## 2012-07-31 DIAGNOSIS — G8929 Other chronic pain: Secondary | ICD-10-CM | POA: Insufficient documentation

## 2012-07-31 DIAGNOSIS — F29 Unspecified psychosis not due to a substance or known physiological condition: Secondary | ICD-10-CM | POA: Insufficient documentation

## 2012-07-31 DIAGNOSIS — R059 Cough, unspecified: Secondary | ICD-10-CM | POA: Insufficient documentation

## 2012-07-31 DIAGNOSIS — I10 Essential (primary) hypertension: Secondary | ICD-10-CM | POA: Insufficient documentation

## 2012-07-31 DIAGNOSIS — J449 Chronic obstructive pulmonary disease, unspecified: Secondary | ICD-10-CM | POA: Insufficient documentation

## 2012-07-31 LAB — URINALYSIS, ROUTINE W REFLEX MICROSCOPIC
Glucose, UA: NEGATIVE mg/dL
Hgb urine dipstick: NEGATIVE
Ketones, ur: NEGATIVE mg/dL
pH: 5 (ref 5.0–8.0)

## 2012-07-31 LAB — BASIC METABOLIC PANEL
BUN: 21 mg/dL (ref 6–23)
Chloride: 98 mEq/L (ref 96–112)
Creatinine, Ser: 1.03 mg/dL (ref 0.50–1.10)
GFR calc Af Amer: 58 mL/min — ABNORMAL LOW (ref >90)
Glucose, Bld: 200 mg/dL — ABNORMAL HIGH (ref 70–99)

## 2012-07-31 LAB — CBC
HCT: 36.8 % (ref 36.0–46.0)
MCV: 87.2 fL (ref 78.0–100.0)
RDW: 15 % (ref 11.5–15.5)
WBC: 14.8 10*3/uL — ABNORMAL HIGH (ref 4.0–10.5)

## 2012-07-31 LAB — URINE MICROSCOPIC-ADD ON

## 2012-07-31 NOTE — ED Provider Notes (Signed)
History     CSN: [id number]  Arrival date & time 07/31/12  1028   First MD Initiated Contact with Patient 07/31/12 1036      Chief Complaint  Patient presents with  . Cerebrovascular Accident    (Consider location/radiation/quality/duration/timing/severity/associated sxs/prior treatment) HPI Comments: [name] presents with her daughter from home for evaluation of confusion.  Her daughter reports that she was confused and her speech was garbled this morning.  Her mother initially refused to come to the ER but soon relented.  Prior to EMS arriving at the house, the abnormal speech and confusion resolved.  Her daughter states she has had recurrent UTIs and takes keflex now as a prophylactic medication.  She has had confusion associated with infections previously.  She has had no fever, abdominal pain, dysuria, or respiratory insufficiency per report.  She has started smoking again recently.  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  She denies any new weakness.  Patient is a 78 y.o. female presenting with altered mental status. The history is provided by the patient. No language interpreter was used.  Altered Mental Status This is a new problem. The current episode started 3 to 5 hours ago. The problem has been gradually improving. Pertinent negatives include no chest pain, no abdominal pain, no headaches and no shortness of breath. Nothing aggravates the symptoms. Nothing relieves the symptoms. She has tried nothing for the symptoms.    Past Medical History  Diagnosis Date  . COPD (chronic obstructive pulmonary disease)     from inpatient chart  . CHF (congestive heart failure)   . Hypertension   . Stroke     rt side deficit  . A-fib   . Diabetes mellitus   . Back pain     No past surgical history on file.  No family history on file.  History  Substance Use Topics  . Smoking status: Current Every Day Smoker  . Smokeless tobacco: Not  on file  . Alcohol Use: Yes    OB History    Grav Para Term Preterm Abortions TAB SAB Ect Mult Living                  Review of Systems  Constitutional: Negative for fever, chills, diaphoresis, activity change, appetite change and fatigue.  HENT: Negative for hearing loss, congestion, sore throat, facial swelling, rhinorrhea, sneezing, drooling, trouble swallowing, neck pain, voice change and sinus pressure.   Eyes: Negative for discharge, redness and visual disturbance.  Respiratory: Positive for cough (chronic). Negative for choking, chest tightness, shortness of breath and wheezing.   Cardiovascular: Negative for chest pain, palpitations and leg swelling.  Gastrointestinal: Negative for nausea, vomiting, abdominal pain, diarrhea and constipation.  Genitourinary: Negative for dysuria, urgency, frequency, flank pain and enuresis.  Musculoskeletal: Positive for arthralgias (chronic in bilateral legs, R>L) and gait problem (chronic). Negative for back pain and joint swelling.  Skin: Negative for color change and rash.  Neurological: Positive for speech difficulty (words were slurred but easily perceptible ) and weakness (now resolved. it was diffuse, not focal.). Negative for dizziness, tremors, facial asymmetry, numbness and headaches.  Psychiatric/Behavioral: Positive for confusion and altered mental status. Negative for dysphoric mood. The patient is not nervous/anxious.     Allergies  Aggrenox  Home Medications   Current Outpatient Rx  Name  Route  Sig  Dispense  Refill  . STOOL SOFTENER PO   Oral   Take 2 capsules  by mouth daily as needed. For constipation         . MORPHINE SULFATE ER 30 MG PO TB12   Oral   Take 30 mg by mouth 2 (two) times daily.           Marland Kitchen NORTRIPTYLINE HCL 10 MG PO CAPS   Oral   Take 40 mg by mouth at bedtime.         . TRAMADOL HCL 50 MG PO TABS   Oral   Take 100 mg by mouth 2 (two) times daily.         . TRAZODONE HCL 150 MG PO  TABS   Oral   Take 150 mg by mouth at bedtime.             BP 134/48  Pulse 93  Temp 99.8 F (37.7 C) (Oral)  Resp 18  SpO2 92%  Physical Exam  Nursing note and vitals reviewed. Constitutional: She is oriented to person, place, and time. She appears well-developed and well-nourished. No distress.  HENT:  Head: Normocephalic and atraumatic.  Right Ear: External ear normal.  Left Ear: External ear normal.  Nose: Nose normal.  Mouth/Throat: Oropharynx is clear and moist. No oropharyngeal exudate.  Eyes: Conjunctivae normal are normal. Pupils are equal, round, and reactive to light. Right eye exhibits no discharge. Left eye exhibits no discharge. No scleral icterus.  Neck: Normal range of motion. Neck supple. No JVD present. No tracheal deviation present.  Cardiovascular: Normal rate, regular rhythm, S1 normal, normal heart sounds and intact distal pulses.  Exam reveals no gallop.   No murmur heard. Pulmonary/Chest: Effort normal and breath sounds normal. No stridor. No respiratory distress. She has no wheezes. She has no rales. She exhibits no tenderness.  Abdominal: Soft. Bowel sounds are normal. She exhibits no distension and no mass. There is no tenderness. There is no rebound and no guarding.  Musculoskeletal: Normal range of motion. She exhibits edema (trace). She exhibits no tenderness.  Lymphadenopathy:    She has no cervical adenopathy.  Neurological: She is alert and oriented to person, place, and time. She displays no atrophy and no tremor. No cranial nerve deficit or sensory deficit. She exhibits abnormal muscle tone (slightly diminished right arm and leg strength as compared to left). She displays no seizure activity. Coordination normal. GCS eye subscore is 4. GCS verbal subscore is 5. GCS motor subscore is 6. She displays no Babinski's sign on the right side. She displays no Babinski's sign on the left side.  Skin: Skin is warm and dry. No rash noted. She is not  diaphoretic. No erythema. No pallor.  Psychiatric: She has a normal mood and affect. Her behavior is normal.    ED Course  Procedures (including critical care time)   Labs Reviewed  CBC  BASIC METABOLIC PANEL  URINALYSIS, ROUTINE W REFLEX MICROSCOPIC  URINE CULTURE   No results found.   No diagnosis found.   Date: 07/31/2012  Rate: 91 bpm  Rhythm: atrial fibrillation  QRS Axis: normal  Intervals:    ST/T Wave abnormalities: nonspecific ST changes  Conduction Disutrbances:right bundle branch block  Narrative Interpretation:   Old EKG Reviewed: unchanged      MDM  Pt presents for evaluation of confusion.  She was confused and slurring her words earlier this morning.  This has since resolved.  She has a known hx of recurrent UTIs.  She also has had a CVA that during the acute period was associated with  aphasia.  Currently she is alert and oriented and her words are well formed.  She has no pain.  Will obtain basic labs and a urinalysis + urine culture and reassess.  1125.  Called to the bedside.  Pt is reported to be having an episode.  On arrival to the room her words are well formed however she is slightly confused.  Will check a rectal temperature.  At this pointe her exam appears more consistent with delirium.  Will continue to follow closely as I aawait lab results.  Will obtain a head CT if any evidence of aphasia is noted.  1500.  Pt sleep.  Easily aroused.  Alert and oriented.  No new deficits noted.  Her VS have remained stable since arrival.  Note a mild leukocytosis. Pt has had a mild leukocytosis on all recent previous labs.  The U/A does not appear consistent with a UTI.  Urine culture has been sent.  She will be contacted if the urine culture is positive to receive a prescription for an appropriate antibiotic.  Plan discharge home.  I have advised her daughter to call her PMD for an office visit tomorrow.  She has also been advised to return to the emergency  department for further evaluation if she has any furthe episodes of confusion or weakness.       Tobin Chad, MD 07/31/12 1515

## 2012-07-31 NOTE — ED Notes (Signed)
Pt daughter woke her this am found with slurred speech, confusion, last seen normal last night, hx of stroke and TIAs

## 2012-07-31 NOTE — ED Notes (Signed)
[facility] called to pick up pt  

## 2012-08-02 LAB — URINE CULTURE: Colony Count: 25000

## 2013-04-23 ENCOUNTER — Encounter (HOSPITAL_COMMUNITY): Payer: Self-pay | Admitting: *Deleted

## 2013-04-23 ENCOUNTER — Emergency Department (HOSPITAL_COMMUNITY)
Admission: EM | Admit: 2013-04-23 | Discharge: 2013-04-23 | Disposition: A | Discharge status: Home / Self Care | Payer: Medicare Other | Attending: Emergency Medicine | Admitting: Emergency Medicine

## 2013-04-23 DIAGNOSIS — Z8673 Personal history of transient ischemic attack (TIA), and cerebral infarction without residual deficits: Secondary | ICD-10-CM | POA: Insufficient documentation

## 2013-04-23 DIAGNOSIS — I10 Essential (primary) hypertension: Secondary | ICD-10-CM | POA: Insufficient documentation

## 2013-04-23 DIAGNOSIS — I4891 Unspecified atrial fibrillation: Secondary | ICD-10-CM | POA: Insufficient documentation

## 2013-04-23 DIAGNOSIS — J449 Chronic obstructive pulmonary disease, unspecified: Secondary | ICD-10-CM | POA: Insufficient documentation

## 2013-04-23 DIAGNOSIS — I509 Heart failure, unspecified: Secondary | ICD-10-CM | POA: Insufficient documentation

## 2013-04-23 DIAGNOSIS — Z79899 Other long term (current) drug therapy: Secondary | ICD-10-CM | POA: Insufficient documentation

## 2013-04-23 DIAGNOSIS — F17200 Nicotine dependence, unspecified, uncomplicated: Secondary | ICD-10-CM | POA: Insufficient documentation

## 2013-04-23 DIAGNOSIS — N390 Urinary tract infection, site not specified: Secondary | ICD-10-CM | POA: Insufficient documentation

## 2013-04-23 DIAGNOSIS — Z888 Allergy status to other drugs, medicaments and biological substances status: Secondary | ICD-10-CM | POA: Insufficient documentation

## 2013-04-23 DIAGNOSIS — Z87440 Personal history of urinary (tract) infections: Secondary | ICD-10-CM | POA: Insufficient documentation

## 2013-04-23 DIAGNOSIS — J4489 Other specified chronic obstructive pulmonary disease: Secondary | ICD-10-CM | POA: Insufficient documentation

## 2013-04-23 DIAGNOSIS — E1129 Type 2 diabetes mellitus with other diabetic kidney complication: Secondary | ICD-10-CM | POA: Insufficient documentation

## 2013-04-23 LAB — URINALYSIS, ROUTINE W REFLEX MICROSCOPIC
Bilirubin Urine: NEGATIVE
Glucose, UA: NEGATIVE mg/dL
Ketones, ur: 15 mg/dL — AB
Nitrite: POSITIVE — AB
Protein, ur: 30 mg/dL — AB
Specific Gravity, Urine: 1.016 (ref 1.005–1.030)
Urobilinogen, UA: 1 mg/dL (ref 0.0–1.0)
pH: 5.5 (ref 5.0–8.0)

## 2013-04-23 LAB — CBC WITH DIFFERENTIAL/PLATELET
Basophils Absolute: 0 10*3/uL (ref 0.0–0.1)
Basophils Relative: 0 % (ref 0–1)
Eosinophils Absolute: 0.2 10*3/uL (ref 0.0–0.7)
Eosinophils Relative: 2 % (ref 0–5)
HCT: 31.7 % — ABNORMAL LOW (ref 36.0–46.0)
Hemoglobin: 10.1 g/dL — ABNORMAL LOW (ref 12.0–15.0)
Lymphocytes Relative: 9 % — ABNORMAL LOW (ref 12–46)
Lymphs Abs: 1.3 10*3/uL (ref 0.7–4.0)
MCH: 26.9 pg (ref 26.0–34.0)
MCHC: 31.9 g/dL (ref 30.0–36.0)
MCV: 84.3 fL (ref 78.0–100.0)
Monocytes Absolute: 1 10*3/uL (ref 0.1–1.0)
Monocytes Relative: 7 % (ref 3–12)
Neutro Abs: 12.1 10*3/uL — ABNORMAL HIGH (ref 1.7–7.7)
Neutrophils Relative %: 83 % — ABNORMAL HIGH (ref 43–77)
Platelets: 316 10*3/uL (ref 150–400)
RBC: 3.76 MIL/uL — ABNORMAL LOW (ref 3.87–5.11)
RDW: 15.4 % (ref 11.5–15.5)
WBC: 14.6 10*3/uL — ABNORMAL HIGH (ref 4.0–10.5)

## 2013-04-23 LAB — BASIC METABOLIC PANEL
BUN: 29 mg/dL — ABNORMAL HIGH (ref 6–23)
CO2: 22 mEq/L (ref 19–32)
Calcium: 8.2 mg/dL — ABNORMAL LOW (ref 8.4–10.5)
Chloride: 94 mEq/L — ABNORMAL LOW (ref 96–112)
Creatinine, Ser: 1.48 mg/dL — ABNORMAL HIGH (ref 0.50–1.10)
GFR calc Af Amer: 37 mL/min — ABNORMAL LOW (ref >90)
GFR calc non Af Amer: 32 mL/min — ABNORMAL LOW (ref >90)
Glucose, Bld: 155 mg/dL — ABNORMAL HIGH (ref 70–99)
Potassium: 4 mEq/L (ref 3.5–5.1)
Sodium: 127 mEq/L — ABNORMAL LOW (ref 135–145)

## 2013-04-23 LAB — URINE MICROSCOPIC-ADD ON

## 2013-04-23 MED ORDER — ACETAMINOPHEN 325 MG PO TABS
650.0000 mg | ORAL_TABLET | Freq: Once | ORAL | Status: AC
  Administered 2013-04-23: 650 mg via ORAL
  Filled 2013-04-23: qty 2

## 2013-04-23 MED ORDER — DEXTROSE 5 % IV SOLN
1.0000 g | Freq: Once | INTRAVENOUS | Status: AC
  Administered 2013-04-23: 1 g via INTRAVENOUS
  Filled 2013-04-23: qty 10

## 2013-04-23 MED ORDER — SULFAMETHOXAZOLE-TRIMETHOPRIM 800-160 MG PO TABS: 1.0000 | ORAL_TABLET | Freq: Two times a day (BID) | ORAL | Status: DC

## 2013-04-23 NOTE — ED Provider Notes (Signed)
CSN: [id number]     Arrival date & time 04/23/13  1243 History     First MD Initiated Contact with Patient 04/23/13 1250     Chief Complaint  Patient presents with  . Urinary Tract Infection  . Altered Mental Status   (Consider location/radiation/quality/duration/timing/severity/associated sxs/prior Treatment) HPI The patient presents to the ER with complaints of UTI and some disorientation. The patient will give me some history and the daughter also provides some of the history is well.  The daughter states the patient been recently treated for UTI with Keflex over the last 2 weeks and finished her last dose 3 days, ago.  Daughter states the patient started getting mildly confused over the last 2 days.  Her mother gets frequent UTIs and this is the classic symptoms for her usual UTI.  The patient and daughter both deny chest pain, shortness of breath, nausea, vomiting, diarrhea, headache, blurred vision, lethargy, decreased level of consciousness or syncope.  The daughter states she would like for the patient to go home if at all possible and the patient would like to as well. Past Medical History  Diagnosis Date  . COPD (chronic obstructive pulmonary disease)     from inpatient chart  . CHF (congestive heart failure)   . Hypertension   . Stroke     rt side deficit  . A-fib   . Diabetes mellitus   . Back pain    History reviewed. No pertinent past surgical history. No family history on file. History  Substance Use Topics  . Smoking status: Current Every Day Smoker  . Smokeless tobacco: Not on file  . Alcohol Use: Yes   OB History   Grav Para Term Preterm Abortions TAB SAB Ect Mult Living                 Review of Systems All other systems negative except as documented in the HPI. All pertinent positives and negatives as reviewed in the HPI. Allergies  Aggrenox  Home Medications   Current Outpatient Rx  Name  Route  Sig  Dispense  Refill  . Docusate Calcium (STOOL  SOFTENER PO)   Oral   Take 2 capsules by mouth daily as needed. For constipation         . morphine (MS CONTIN) 30 MG 12 hr tablet   Oral   Take 30 mg by mouth 2 (two) times daily.           . nortriptyline (PAMELOR) 10 MG capsule   Oral   Take 40 mg by mouth at bedtime.         . traMADol (ULTRAM) 50 MG tablet   Oral   Take 100 mg by mouth 2 (two) times daily.         . traZODone (DESYREL) 150 MG tablet   Oral   Take 150 mg by mouth at bedtime.            BP 100/42  Pulse 82  Temp(Src) 101.7 F (38.7 C) (Rectal)  Resp 18  SpO2 92% Physical Exam  Nursing note and vitals reviewed. Constitutional: She appears well-developed and well-nourished.  HENT:  Head: Normocephalic and atraumatic.  Mouth/Throat: Oropharynx is clear and moist.  Eyes: Pupils are equal, round, and reactive to light.  Neck: Normal range of motion. Neck supple.  Cardiovascular: Normal rate, regular rhythm and normal heart sounds.  Exam reveals no gallop and no friction rub.   No murmur heard. Pulmonary/Chest: Effort normal and breath  sounds normal. No respiratory distress.  Abdominal: Soft. Bowel sounds are normal. She exhibits no distension. There is no tenderness. There is no rebound and no guarding.  Neurological: She is alert. She exhibits normal muscle tone. Coordination normal.  Patient oriented to place and person.  Skin: Skin is warm and dry. No rash noted. No erythema.    ED Course   Procedures (including critical care time)  Labs Reviewed  CBC WITH DIFFERENTIAL - Abnormal; Notable for the following:    WBC 14.6 (*)    RBC 3.76 (*)    Hemoglobin 10.1 (*)    HCT 31.7 (*)    Neutrophils Relative % 83 (*)    Neutro Abs 12.1 (*)    Lymphocytes Relative 9 (*)    All other components within normal limits  BASIC METABOLIC PANEL - Abnormal; Notable for the following:    Sodium 127 (*)    Chloride 94 (*)    Glucose, Bld 155 (*)    BUN 29 (*)    Creatinine, Ser 1.48 (*)     Calcium 8.2 (*)    GFR calc non Af Amer 32 (*)    GFR calc Af Amer 37 (*)    All other components within normal limits  URINALYSIS, ROUTINE W REFLEX MICROSCOPIC - Abnormal; Notable for the following:    APPearance CLOUDY (*)    Hgb urine dipstick TRACE (*)    Ketones, ur 15 (*)    Protein, ur 30 (*)    Nitrite POSITIVE (*)    Leukocytes, UA LARGE (*)    All other components within normal limits  URINE MICROSCOPIC-ADD ON - Abnormal; Notable for the following:    Bacteria, UA MANY (*)    All other components within normal limits  URINE CULTURE  CG4 I-STAT (LACTIC ACID)  The patient does have UTI with fever. The daughter is advised that the patients condition could worsen due to her infection and age. The daughter understood this risk. The patient and daughter are advised to follow up with her doctor tomorrow. The patient is stable mostly here in the ER. Patients electrolytes are fairly stable when compared to previous. The daughter also advised to return here for any worsening in the patients condition. I again advised that the patient would possibly benefit from admission.   MDM  MDM Reviewed: nursing note, vitals and previous chart Reviewed previous: labs Interpretation: labs      Carlyle Dolly, PA-C 04/25/13 1129

## 2013-04-23 NOTE — ED Notes (Signed)
Pt reporting 8/10 lower abdominal pain to bladder due to not being able to urinate.

## 2013-04-23 NOTE — ED Notes (Signed)
Family states patient has been on antibiotics x 2 weeks for uti, patient now over past 24 hrs with mental status changes, patient alert to self, patient family states usually a/o x 4,

## 2013-04-26 LAB — URINE CULTURE: Colony Count: >=100000

## 2013-04-26 NOTE — ED Provider Notes (Signed)
Medical screening examination/treatment/procedure(s) were conducted as a shared visit with non-physician practitioner(s) and myself.  I personally evaluated the patient during the encounter Pt with AMS and lives with daughter with recent tx of UTI.  Today found to have UTI.  Pt is altered but awake.  Daughter insists on taking pt home and will f/u with PCP or return if pt starts to not take po's or other problems  Gwyneth Sprout, MD 04/26/13 [phone]

## 2013-04-27 ENCOUNTER — Telehealth (HOSPITAL_COMMUNITY): Payer: Self-pay | Admitting: *Deleted

## 2013-04-27 NOTE — ED Notes (Signed)
Post ED Visit - Positive Culture Follow-up: Successful Patient Follow-Up  Culture assessed and recommendations reviewed by: []  Wes Dulaney, Pharm.D., BCPS []  Celedonio Miyamoto, Pharm.D., BCPS []  Georgina Pillion, [address].D., BCPS []  [place], [address].D., BCPS, AAHIVP [x]  Estella Husk, Pharm.D., BCPS, AAHIVP  Positive urine culture  []  Patient discharged without antimicrobial prescription and treatment is now indicated [x]  Organism is resistant to prescribed ED discharge antimicrobial []  Patient with positive blood cultures  Changes discussed with ED provider:Heather Anne Shutter New antibiotic prescription -Omnicef 300 mg po BID x 10 days,20 tablets Ask patient to stop Septra.   Larena Sox 04/27/2013, 2:37 PM

## 2013-04-27 NOTE — Progress Notes (Signed)
ED Antimicrobial Stewardship Positive Culture Follow Up   [name] is an 79 y.o. female who presented to [facility] on 04/23/2013 with a chief complaint of  Chief Complaint  Patient presents with  . Urinary Tract Infection  . Altered Mental Status    Recent Results (from the past 720 hour(s))  URINE CULTURE     Status: None   Collection Time    04/23/13  1:17 PM      Result Value Range Status   Specimen Description URINE, CATHETERIZED   Final   Special Requests NONE   Final   Culture  Setup Time 04/23/2013 21:25   Final   Colony Count >=100,000 COLONIES/ML   Final   Culture ESCHERICHIA COLI   Final   Report Status 04/26/2013 FINAL   Final   Organism ID, Bacteria ESCHERICHIA COLI   Final    [x]  Treated with Septra, organism resistant to prescribed antimicrobial  New antibiotic prescription: Omnicef 300mg  PO BID x 10 days.  Ask patient to stop taking Septra.  ED Provider: Pascal Lux Wingen, PA-C   Sallee Provencal 04/27/2013, 10:57 AM Infectious Diseases Pharmacist Phone# [phone]

## 2013-04-28 ENCOUNTER — Telehealth (HOSPITAL_COMMUNITY): Payer: Self-pay | Admitting: Emergency Medicine

## 2013-05-05 ENCOUNTER — Telehealth (HOSPITAL_COMMUNITY): Payer: Self-pay | Admitting: Emergency Medicine

## 2013-05-05 NOTE — ED Notes (Signed)
Unable to contact patient via phone. Sent letter. °

## 2013-05-23 ENCOUNTER — Other Ambulatory Visit: Payer: Self-pay | Admitting: Family Medicine

## 2013-05-23 DIAGNOSIS — R102 Pelvic and perineal pain: Secondary | ICD-10-CM

## 2013-05-29 ENCOUNTER — Other Ambulatory Visit: Payer: Medicare Other

## 2013-06-05 ENCOUNTER — Ambulatory Visit
Admission: RE | Admit: 2013-06-05 | Discharge: 2013-06-05 | Disposition: A | Discharge status: Home / Self Care | Payer: Medicare Other | Source: Ambulatory Visit | Attending: Family Medicine | Admitting: Family Medicine

## 2013-06-05 DIAGNOSIS — R102 Pelvic and perineal pain: Secondary | ICD-10-CM

## 2013-09-02 ENCOUNTER — Inpatient Hospital Stay (HOSPITAL_COMMUNITY)
Admission: EM | Admit: 2013-09-02 | Discharge: 2013-09-14 | DRG: 330 | Disposition: A | Discharge status: Home / Self Care | Payer: Medicare Other | Attending: Family Medicine | Admitting: Family Medicine

## 2013-09-02 ENCOUNTER — Emergency Department (HOSPITAL_COMMUNITY): Payer: Medicare Other

## 2013-09-02 ENCOUNTER — Encounter (HOSPITAL_COMMUNITY): Payer: Self-pay | Admitting: Emergency Medicine

## 2013-09-02 DIAGNOSIS — E119 Type 2 diabetes mellitus without complications: Secondary | ICD-10-CM

## 2013-09-02 DIAGNOSIS — F17200 Nicotine dependence, unspecified, uncomplicated: Secondary | ICD-10-CM | POA: Diagnosis present

## 2013-09-02 DIAGNOSIS — R141 Gas pain: Secondary | ICD-10-CM

## 2013-09-02 DIAGNOSIS — I10 Essential (primary) hypertension: Secondary | ICD-10-CM

## 2013-09-02 DIAGNOSIS — R143 Flatulence: Secondary | ICD-10-CM

## 2013-09-02 DIAGNOSIS — R32 Unspecified urinary incontinence: Secondary | ICD-10-CM | POA: Diagnosis present

## 2013-09-02 DIAGNOSIS — Z860100 Personal history of colon polyps, unspecified: Secondary | ICD-10-CM

## 2013-09-02 DIAGNOSIS — K567 Ileus, unspecified: Secondary | ICD-10-CM

## 2013-09-02 DIAGNOSIS — R1084 Generalized abdominal pain: Secondary | ICD-10-CM | POA: Diagnosis present

## 2013-09-02 DIAGNOSIS — K648 Other hemorrhoids: Secondary | ICD-10-CM | POA: Diagnosis present

## 2013-09-02 DIAGNOSIS — C185 Malignant neoplasm of splenic flexure: Principal | ICD-10-CM | POA: Diagnosis present

## 2013-09-02 DIAGNOSIS — K56600 Partial intestinal obstruction, unspecified as to cause: Secondary | ICD-10-CM

## 2013-09-02 DIAGNOSIS — E876 Hypokalemia: Secondary | ICD-10-CM | POA: Diagnosis not present

## 2013-09-02 DIAGNOSIS — J449 Chronic obstructive pulmonary disease, unspecified: Secondary | ICD-10-CM | POA: Diagnosis present

## 2013-09-02 DIAGNOSIS — E785 Hyperlipidemia, unspecified: Secondary | ICD-10-CM

## 2013-09-02 DIAGNOSIS — Z79899 Other long term (current) drug therapy: Secondary | ICD-10-CM

## 2013-09-02 DIAGNOSIS — Z87440 Personal history of urinary (tract) infections: Secondary | ICD-10-CM

## 2013-09-02 DIAGNOSIS — N390 Urinary tract infection, site not specified: Secondary | ICD-10-CM

## 2013-09-02 DIAGNOSIS — K5669 Other intestinal obstruction: Secondary | ICD-10-CM | POA: Diagnosis present

## 2013-09-02 DIAGNOSIS — I480 Paroxysmal atrial fibrillation: Secondary | ICD-10-CM

## 2013-09-02 DIAGNOSIS — J9600 Acute respiratory failure, unspecified whether with hypoxia or hypercapnia: Secondary | ICD-10-CM

## 2013-09-02 DIAGNOSIS — J4489 Other specified chronic obstructive pulmonary disease: Secondary | ICD-10-CM | POA: Diagnosis present

## 2013-09-02 DIAGNOSIS — Y9210 Unspecified residential institution as the place of occurrence of the external cause: Secondary | ICD-10-CM | POA: Diagnosis not present

## 2013-09-02 DIAGNOSIS — Z01810 Encounter for preprocedural cardiovascular examination: Secondary | ICD-10-CM

## 2013-09-02 DIAGNOSIS — Y833 Surgical operation with formation of external stoma as the cause of abnormal reaction of the patient, or of later complication, without mention of misadventure at the time of the procedure: Secondary | ICD-10-CM | POA: Diagnosis not present

## 2013-09-02 DIAGNOSIS — R34 Anuria and oliguria: Secondary | ICD-10-CM | POA: Diagnosis present

## 2013-09-02 DIAGNOSIS — R109 Unspecified abdominal pain: Secondary | ICD-10-CM

## 2013-09-02 DIAGNOSIS — K560 Paralytic ileus: Secondary | ICD-10-CM | POA: Diagnosis not present

## 2013-09-02 DIAGNOSIS — Z23 Encounter for immunization: Secondary | ICD-10-CM

## 2013-09-02 DIAGNOSIS — I4891 Unspecified atrial fibrillation: Secondary | ICD-10-CM | POA: Diagnosis not present

## 2013-09-02 DIAGNOSIS — Z7902 Long term (current) use of antithrombotics/antiplatelets: Secondary | ICD-10-CM

## 2013-09-02 DIAGNOSIS — R142 Eructation: Secondary | ICD-10-CM

## 2013-09-02 DIAGNOSIS — Z8673 Personal history of transient ischemic attack (TIA), and cerebral infarction without residual deficits: Secondary | ICD-10-CM

## 2013-09-02 DIAGNOSIS — K929 Disease of digestive system, unspecified: Secondary | ICD-10-CM | POA: Diagnosis not present

## 2013-09-02 DIAGNOSIS — I509 Heart failure, unspecified: Secondary | ICD-10-CM | POA: Diagnosis present

## 2013-09-02 DIAGNOSIS — C189 Malignant neoplasm of colon, unspecified: Secondary | ICD-10-CM

## 2013-09-02 DIAGNOSIS — I5032 Chronic diastolic (congestive) heart failure: Secondary | ICD-10-CM | POA: Diagnosis present

## 2013-09-02 DIAGNOSIS — I672 Cerebral atherosclerosis: Secondary | ICD-10-CM | POA: Diagnosis present

## 2013-09-02 DIAGNOSIS — F0150 Vascular dementia without behavioral disturbance: Secondary | ICD-10-CM | POA: Diagnosis present

## 2013-09-02 DIAGNOSIS — K56609 Unspecified intestinal obstruction, unspecified as to partial versus complete obstruction: Secondary | ICD-10-CM

## 2013-09-02 DIAGNOSIS — N183 Chronic kidney disease, stage 3 (moderate): Secondary | ICD-10-CM

## 2013-09-02 HISTORY — DX: Polyp of colon: K63.5

## 2013-09-02 HISTORY — DX: Gastrointestinal hemorrhage, unspecified: K92.2

## 2013-09-02 HISTORY — DX: Constipation, unspecified: K59.00

## 2013-09-02 HISTORY — DX: Tobacco use: Z72.0

## 2013-09-02 HISTORY — DX: Unspecified intestinal obstruction, unspecified as to partial versus complete obstruction: K56.609

## 2013-09-02 LAB — PROTIME-INR
INR: 1.05 (ref 0.00–1.49)
Prothrombin Time: 13.5 seconds (ref 11.6–15.2)

## 2013-09-02 LAB — URINE MICROSCOPIC-ADD ON

## 2013-09-02 LAB — CBC WITH DIFFERENTIAL/PLATELET
Basophils Absolute: 0 10*3/uL (ref 0.0–0.1)
Eosinophils Absolute: 0.1 10*3/uL (ref 0.0–0.7)
Eosinophils Relative: 1 % (ref 0–5)
HCT: 39.4 % (ref 36.0–46.0)
Lymphocytes Relative: 14 % (ref 12–46)
MCH: 27.9 pg (ref 26.0–34.0)
MCHC: 33 g/dL (ref 30.0–36.0)
MCV: 84.5 fL (ref 78.0–100.0)
Monocytes Absolute: 0.6 10*3/uL (ref 0.1–1.0)
RDW: 15.6 % — ABNORMAL HIGH (ref 11.5–15.5)

## 2013-09-02 LAB — URINALYSIS, ROUTINE W REFLEX MICROSCOPIC
Bilirubin Urine: NEGATIVE
Ketones, ur: NEGATIVE mg/dL
Specific Gravity, Urine: 1.018 (ref 1.005–1.030)
Urobilinogen, UA: 0.2 mg/dL (ref 0.0–1.0)

## 2013-09-02 LAB — HEPATIC FUNCTION PANEL
AST: 9 U/L (ref 0–37)
Albumin: 3.3 g/dL — ABNORMAL LOW (ref 3.5–5.2)
Total Bilirubin: 0.3 mg/dL (ref 0.3–1.2)

## 2013-09-02 LAB — POCT I-STAT, CHEM 8
Chloride: 104 mEq/L (ref 96–112)
HCT: 44 % (ref 36.0–46.0)
Potassium: 4 mEq/L (ref 3.5–5.1)

## 2013-09-02 LAB — CREATININE, SERUM: Creatinine, Ser: 1.01 mg/dL (ref 0.50–1.10)

## 2013-09-02 LAB — CG4 I-STAT (LACTIC ACID): Lactic Acid, Venous: 2.29 mmol/L — ABNORMAL HIGH (ref 0.5–2.2)

## 2013-09-02 LAB — POCT I-STAT TROPONIN I

## 2013-09-02 MED ORDER — IOHEXOL 300 MG/ML  SOLN
80.0000 mL | Freq: Once | INTRAMUSCULAR | Status: AC | PRN
  Administered 2013-09-02: 80 mL via INTRAVENOUS

## 2013-09-02 MED ORDER — GLYCERIN (LAXATIVE) 2.1 G RE SUPP
1.0000 | Freq: Once | RECTAL | Status: DC
  Filled 2013-09-02: qty 1

## 2013-09-02 MED ORDER — ONDANSETRON HCL 4 MG/2ML IJ SOLN
4.0000 mg | Freq: Three times a day (TID) | INTRAMUSCULAR | Status: DC | PRN
  Administered 2013-09-02 – 2013-09-14 (×11): 4 mg via INTRAVENOUS
  Filled 2013-09-02 (×13): qty 2

## 2013-09-02 MED ORDER — HEPARIN SODIUM (PORCINE) 5000 UNIT/ML IJ SOLN
5000.0000 [IU] | Freq: Three times a day (TID) | INTRAMUSCULAR | Status: DC
  Administered 2013-09-02 – 2013-09-05 (×8): 5000 [IU] via SUBCUTANEOUS
  Filled 2013-09-02 (×11): qty 1

## 2013-09-02 MED ORDER — ONDANSETRON HCL 4 MG/2ML IJ SOLN
4.0000 mg | Freq: Once | INTRAMUSCULAR | Status: AC
  Administered 2013-09-02: 4 mg via INTRAVENOUS
  Filled 2013-09-02: qty 2

## 2013-09-02 MED ORDER — MORPHINE SULFATE 4 MG/ML IJ SOLN
4.0000 mg | Freq: Once | INTRAMUSCULAR | Status: AC
  Administered 2013-09-02: 4 mg via INTRAVENOUS
  Filled 2013-09-02: qty 1

## 2013-09-02 MED ORDER — SODIUM CHLORIDE 0.9 % IV SOLN
INTRAVENOUS | Status: DC
  Administered 2013-09-02 – 2013-09-04 (×4): via INTRAVENOUS

## 2013-09-02 MED ORDER — DEXTROSE 5 % IV SOLN
1.0000 g | Freq: Once | INTRAVENOUS | Status: AC
  Administered 2013-09-02: 1 g via INTRAVENOUS
  Filled 2013-09-02: qty 10

## 2013-09-02 MED ORDER — IOHEXOL 300 MG/ML  SOLN: 25.0000 mL | INTRAMUSCULAR | Status: DC | PRN

## 2013-09-02 MED ORDER — PNEUMOCOCCAL VAC POLYVALENT 25 MCG/0.5ML IJ INJ
0.5000 mL | INJECTION | INTRAMUSCULAR | Status: AC
  Administered 2013-09-03: 0.5 mL via INTRAMUSCULAR
  Filled 2013-09-02: qty 0.5

## 2013-09-02 MED ORDER — SODIUM CHLORIDE 0.9 % IV BOLUS (SEPSIS)
1000.0000 mL | Freq: Once | INTRAVENOUS | Status: AC
  Administered 2013-09-02: 1000 mL via INTRAVENOUS

## 2013-09-02 MED ORDER — MORPHINE SULFATE 2 MG/ML IJ SOLN
2.0000 mg | INTRAMUSCULAR | Status: DC | PRN
  Administered 2013-09-02 – 2013-09-07 (×8): 2 mg via INTRAVENOUS
  Filled 2013-09-02 (×8): qty 1

## 2013-09-02 MED ORDER — DEXTROSE 5 % IV SOLN
1.0000 g | INTRAVENOUS | Status: DC
  Administered 2013-09-03 – 2013-09-05 (×3): 1 g via INTRAVENOUS
  Filled 2013-09-02 (×4): qty 10

## 2013-09-02 NOTE — ED Provider Notes (Signed)
CSN: [id number]     Arrival date & time 09/02/13  1129 History   First MD Initiated Contact with Patient 09/02/13 1130     No chief complaint on file.  (Consider location/radiation/quality/duration/timing/severity/associated sxs/prior Treatment) HPI Comments: Pt with hx of CHF, COPD, afib, DM comes in with cc of abd pain. Pt states that she has been having RLQ abd pain 1 am. The pain is constant, with occasional worsening. The pain is non radiating. There is nausea and emesis - non bilious, non bloody. Last BM was 4 days ago. Pt is passing flatus.   The history is provided by the patient.    Past Medical History  Diagnosis Date  . COPD (chronic obstructive pulmonary disease)     from inpatient chart  . CHF (congestive heart failure)   . Hypertension   . Stroke     rt side deficit  . A-fib   . Diabetes mellitus   . Back pain   . Constipation    History reviewed. No pertinent past surgical history. No family history on file. History  Substance Use Topics  . Smoking status: Current Every Day Smoker  . Smokeless tobacco: Not on file  . Alcohol Use: Yes   OB History   Grav Para Term Preterm Abortions TAB SAB Ect Mult Living                 Review of Systems  Constitutional: Negative for activity change.  HENT: Negative for facial swelling.   Respiratory: Negative for cough, shortness of breath and wheezing.   Cardiovascular: Negative for chest pain.  Gastrointestinal: Positive for nausea, vomiting and abdominal pain. Negative for diarrhea, constipation, blood in stool and abdominal distention.  Genitourinary: Negative for hematuria and difficulty urinating.  Musculoskeletal: Negative for neck pain.  Skin: Negative for color change.  Neurological: Negative for speech difficulty.  Hematological: Does not bruise/bleed easily.  Psychiatric/Behavioral: Negative for confusion.    Allergies  Aggrenox  Home Medications   Current Outpatient Rx  Name  Route  Sig  Dispense   Refill  . Docusate Calcium (STOOL SOFTENER PO)   Oral   Take 2 capsules by mouth daily as needed. For constipation         . morphine (MS CONTIN) 30 MG 12 hr tablet   Oral   Take 30 mg by mouth 2 (two) times daily.           . nitroGLYCERIN (NITROSTAT) 0.4 MG SL tablet   Sublingual   Place 0.4 mg under the tongue every 5 (five) minutes as needed for chest pain.         . nortriptyline (PAMELOR) 25 MG capsule   Oral   Take 100 mg by mouth at bedtime.         . traMADol (ULTRAM) 50 MG tablet   Oral   Take 100 mg by mouth 2 (two) times daily.         . traZODone (DESYREL) 150 MG tablet   Oral   Take 150 mg by mouth at bedtime.           . sulfamethoxazole-trimethoprim (BACTRIM DS,SEPTRA DS) 800-160 MG per tablet   Oral   Take 1 tablet by mouth 2 (two) times daily. For 14 days          BP 160/63  Pulse 85  Temp(Src) 98.5 F (36.9 C) (Oral)  Resp 15  SpO2 98% Physical Exam  Nursing note and vitals reviewed. Constitutional: She is oriented  to person, place, and time. She appears well-developed and well-nourished.  HENT:  Head: Normocephalic and atraumatic.  Eyes: EOM are normal. Pupils are equal, round, and reactive to light.  Neck: Neck supple.  Cardiovascular: Normal rate, regular rhythm and normal heart sounds.   No murmur heard. Pulmonary/Chest: Effort normal. No respiratory distress.  Abdominal: Soft. She exhibits no distension. There is tenderness. There is no rebound and no guarding.  RUQ and RLQ tenderness, with guarding over the RLQ.  Neurological: She is alert and oriented to person, place, and time.  Skin: Skin is warm and dry.    ED Course  Procedures (including critical care time) Labs Review Labs Reviewed  CBC WITH DIFFERENTIAL - Abnormal; Notable for the following:    WBC 14.5 (*)    RDW 15.6 (*)    Platelets 471 (*)    Neutrophils Relative % 81 (*)    Neutro Abs 11.7 (*)    All other components within normal limits  URINALYSIS,  ROUTINE W REFLEX MICROSCOPIC - Abnormal; Notable for the following:    APPearance TURBID (*)    Hgb urine dipstick MODERATE (*)    Protein, ur 100 (*)    Leukocytes, UA LARGE (*)    All other components within normal limits  HEPATIC FUNCTION PANEL - Abnormal; Notable for the following:    Albumin 3.3 (*)    All other components within normal limits  URINE MICROSCOPIC-ADD ON - Abnormal; Notable for the following:    Bacteria, UA MANY (*)    All other components within normal limits  CG4 I-STAT (LACTIC ACID) - Abnormal; Notable for the following:    Lactic Acid, Venous 2.29 (*)    All other components within normal limits  POCT I-STAT, CHEM 8 - Abnormal; Notable for the following:    BUN 26 (*)    Creatinine, Ser 1.20 (*)    Glucose, Bld 160 (*)    All other components within normal limits  URINE CULTURE  LIPASE, BLOOD  APTT  PROTIME-INR  POCT I-STAT TROPONIN I   Imaging Review Ct Abdomen Pelvis W Contrast  09/02/2013   CLINICAL DATA:  Intermittent right lower quadrant pain  EXAM: CT ABDOMEN AND PELVIS WITH CONTRAST  TECHNIQUE: Multidetector CT imaging of the abdomen and pelvis was performed using the standard protocol following bolus administration of intravenous contrast.  CONTRAST:  80mL OMNIPAQUE IOHEXOL 300 MG/ML  SOLN  COMPARISON:  10/02/2011  FINDINGS: Find sagittal images of the spine shows diffuse osteopenia. Mild degenerative changes. Stable Schmorl's node deformity superior endplate of L3 and L4 vertebral body. Atherosclerotic calcifications of abdominal aorta and iliac arteries are noted. SMA calcifications are noted.  Small hiatal hernia is noted. Mild distended gallbladder without evidence of calcified gallstones. Liver, spleen, pancreas and adrenals are unremarkable. The pancreas is atrophic. Kidneys are symmetrical in size and enhancement. No hydronephrosis or hydroureter. Delayed renal images shows bilateral renal symmetrical excretion. Bilateral visualized proximal ureter  is unremarkable.  There is distension of the right colon with fluid. Distension of transverse colon with gas and fluid.  There are distended small bowel loops with air-fluid levels distal small bowel loops highly suspicious for significant ileus or partial small bowel obstruction.  In axial image 32 there is apple core thickening of colonic wall in splenic flexure of the colon. This is highly suspicious for colonic malignancy best visualized on coronal image 34.  Further correlation with colonoscopy is recommended. Nonspecific mild thickening of urinary bladder wall. The urinary bladder has  a lobulated contour. The uterus and adnexa are not identified. Atherosclerotic calcifications of iliac arteries. No distal colonic obstruction. The sigmoid colon is empty partially collapsed.  IMPRESSION: 1. There is distension of the right colon and transverse colon with air-fluid level. Distended distal small bowel loops with air-fluid level suspicious for ileus or partial small bowel obstruction. There is a apple core thickening of the colonic wall in the splenic flexure of the colon axial image 31 and 32 highly suspicious for colonic malignancy. Further correlation with colonoscopy is recommended. 2. No hydronephrosis or hydroureter. These results were called by telephone at the time of interpretation on 09/02/2013 at 4:22 PM to Dr. Derwood Kaplan , who verbally acknowledged these results.   Electronically Signed   By: Natasha Mead M.D.   On: 09/02/2013 16:23   Dg Abd Acute W/chest  09/02/2013   CLINICAL DATA:  Abdominal pain, vomiting and weakness.  EXAM: ACUTE ABDOMEN SERIES (ABDOMEN 2 VIEW & CHEST 1 VIEW)  COMPARISON:  Chest x-ray 10/01/2011.  FINDINGS: The upright chest x-ray demonstrate stable tortuosity, ectasia and calcification of the thoracic aorta. The heart is normal in size. No acute pulmonary findings.  Two views of the abdomen demonstrated dilated small bowel loops and colon with scattered air-fluid levels.  Findings could be due to gastroenteritis or ileus. No free air. The bony structures are intact. Advanced vascular calcifications are noted.  IMPRESSION: No acute cardiopulmonary findings.  Air-filled small bowel loops and colon with mild distention possibly due to gastroenteritis or ileus. No free air.   Electronically Signed   By: Loralie Champagne M.D.   On: 09/02/2013 14:10    EKG Interpretation    Date/Time:  Sunday September 02 2013 11:37:48 EST Ventricular Rate:  94 PR Interval:    QRS Duration: 168 QT Interval:  543 QTC Calculation: 679 R Axis:   73 Text Interpretation:  Atrial fibrillation IVCD, consider atypical RBBB Confirmed by Melvine Julin, MD, Matas Burrows ([id number]) on 09/02/2013 11:49:58 AM            MDM  No diagnosis found.  DDx includes: Pancreatitis Hepatobiliary pathology including cholecystitis Gastritis/PUD SBO ACS syndrome Aortic Dissection AAA Tumors Colitis Intra abdominal abscess Thrombosis Mesenteric ischemia Diverticulitis Peritonitis Appendicitis Hernia Nephrolithiasis Pyelonephritis UTI/Cystitis  Pt comes in with cc of abd pain, right sided, with some guarding. Has risk factors for ischemic bowel, and pain appears out of proportion to the Exam - but lactate is normal, and CT abd with contrast shows no acute mesenteric ischemia findings.  Pt has leukocytosis with UTI. Also, CT scan shows diffuse colonic swelling -with possible partial sbo vs. Ileus, vs mass. Has had colonoscopy in the past, nothing recently.  Will admit.       Derwood Kaplan, MD 09/02/13 [phone]

## 2013-09-02 NOTE — ED Notes (Signed)
Per EMS - pt c/o RLQ abd, intermittent. When the pain comes its cramping 10/10 pain. C/o constipation, LBM 4 days ago. Vomited last night. Reports its clear looking but last night had some bld in it. Denies hx of GI blockage. Hx of CVA with right sided weakness, has some dementia. Per daughter (primary care taker) the pt is acting per normal. BP 190/100 HR 108 RR24.

## 2013-09-02 NOTE — Consult Note (Signed)
Reason for Consult:ABDOMINAL PAIN OBSTRUCTION Referring Physician: Willey Blade  MD  [name] is an 79 y.o. female.  HPI: HISTORY OF CHRONIC CONSTIPATION AND CHRONIC RLQ PAIN FOR MONTHS.  CHANGE IN STOOL PATTERN AND CALIBER/   ADMITTED FOR INCREASING RLQ PAIN WITH N/V.  PASSING FLATUS.  LAST COLONOSCOPY ONER 15 YEARS AGO.  NO BLOOD IN STOOL.  CT CALED ILEUS/SBO.  ASKED TO SEE AT REQUEST OF DR Shary Key  Past Medical History  Diagnosis Date  . COPD (chronic obstructive pulmonary disease)   . CHF (congestive heart failure)     diastolic  . Hypertension   . Stroke 2008    rt side deficit  . A-fib   . Diabetes mellitus   . Back pain   . Constipation   . SBO (small bowel obstruction)     per GI note in 2011, pt denies  . Upper GI bleed 2011    required hospitalization  . Colon polyps     Past Surgical History  Procedure Laterality Date  . Total abdominal hysterectomy  1980    No family history on file.  Social History:  reports that she has been smoking.  She does not have any smokeless tobacco history on file. She reports that she drinks alcohol. She reports that she does not use illicit drugs.  Allergies:  Allergies  Allergen Reactions  . Aggrenox [Aspirin-Dipyridamole Er] Diarrhea and Nausea And Vomiting    Medications: I have reviewed the patient's current medications.  Results for orders placed during the hospital encounter of 09/02/13 (from the past 48 hour(s))  URINALYSIS, ROUTINE W REFLEX MICROSCOPIC     Status: Abnormal   Collection Time    09/02/13 12:10 PM      Result Value Range   Color, Urine YELLOW  YELLOW   APPearance TURBID (*) CLEAR   Specific Gravity, Urine 1.018  1.005 - 1.030   pH 7.0  5.0 - 8.0   Glucose, UA NEGATIVE  NEGATIVE mg/dL   Hgb urine dipstick MODERATE (*) NEGATIVE   Bilirubin Urine NEGATIVE  NEGATIVE   Ketones, ur NEGATIVE  NEGATIVE mg/dL   Protein, ur 478 (*) NEGATIVE mg/dL   Urobilinogen, UA 0.2  0.0 - 1.0 mg/dL   Nitrite  NEGATIVE  NEGATIVE   Leukocytes, UA LARGE (*) NEGATIVE  URINE MICROSCOPIC-ADD ON     Status: Abnormal   Collection Time    09/02/13 12:10 PM      Result Value Range   Squamous Epithelial / LPF RARE  RARE   WBC, UA TOO NUMEROUS TO COUNT  <3 WBC/hpf   RBC / HPF 7-10  <3 RBC/hpf   Bacteria, UA MANY (*) RARE  CBC WITH DIFFERENTIAL     Status: Abnormal   Collection Time    09/02/13 12:20 PM      Result Value Range   WBC 14.5 (*) 4.0 - 10.5 K/uL   RBC 4.66  3.87 - 5.11 MIL/uL   Hemoglobin 13.0  12.0 - 15.0 g/dL   HCT 29.5  62.1 - 30.8 %   MCV 84.5  78.0 - 100.0 fL   MCH 27.9  26.0 - 34.0 pg   MCHC 33.0  30.0 - 36.0 g/dL   RDW 65.7 (*) 84.6 - 96.2 %   Platelets 471 (*) 150 - 400 K/uL   Neutrophils Relative % 81 (*) 43 - 77 %   Neutro Abs 11.7 (*) 1.7 - 7.7 K/uL   Lymphocytes Relative 14  12 - 46 %  Lymphs Abs 2.0  0.7 - 4.0 K/uL   Monocytes Relative 4  3 - 12 %   Monocytes Absolute 0.6  0.1 - 1.0 K/uL   Eosinophils Relative 1  0 - 5 %   Eosinophils Absolute 0.1  0.0 - 0.7 K/uL   Basophils Relative 0  0 - 1 %   Basophils Absolute 0.0  0.0 - 0.1 K/uL  LIPASE, BLOOD     Status: None   Collection Time    09/02/13 12:20 PM      Result Value Range   Lipase 19  11 - 59 U/L  APTT     Status: None   Collection Time    09/02/13 12:20 PM      Result Value Range   aPTT 28  24 - 37 seconds  PROTIME-INR     Status: None   Collection Time    09/02/13 12:20 PM      Result Value Range   Prothrombin Time 13.5  11.6 - 15.2 seconds   INR 1.05  0.00 - 1.49  HEPATIC FUNCTION PANEL     Status: Abnormal   Collection Time    09/02/13 12:20 PM      Result Value Range   Total Protein 7.6  6.0 - 8.3 g/dL   Albumin 3.3 (*) 3.5 - 5.2 g/dL   AST 9  0 - 37 U/L   ALT <5  0 - 35 U/L   Alkaline Phosphatase 67  39 - 117 U/L   Total Bilirubin 0.3  0.3 - 1.2 mg/dL   Bilirubin, Direct <1.6  0.0 - 0.3 mg/dL   Indirect Bilirubin NOT CALCULATED  0.3 - 0.9 mg/dL  POCT I-STAT TROPONIN I     Status: None    Collection Time    09/02/13 12:41 PM      Result Value Range   Troponin i, poc 0.02  0.00 - 0.08 ng/mL   Comment 3            Comment: Due to the release kinetics of cTnI,     a negative result within the first hours     of the onset of symptoms does not rule out     myocardial infarction with certainty.     If myocardial infarction is still suspected,     repeat the test at appropriate intervals.  POCT I-STAT, CHEM 8     Status: Abnormal   Collection Time    09/02/13 12:42 PM      Result Value Range   Sodium 136  135 - 145 mEq/L   Potassium 4.0  3.5 - 5.1 mEq/L   Chloride 104  96 - 112 mEq/L   BUN 26 (*) 6 - 23 mg/dL   Creatinine, Ser 1.09 (*) 0.50 - 1.10 mg/dL   Glucose, Bld 604 (*) 70 - 99 mg/dL   Calcium, Ion 5.40  9.81 - 1.30 mmol/L   TCO2 21  0 - 100 mmol/L   Hemoglobin 15.0  12.0 - 15.0 g/dL   HCT 19.1  47.8 - 29.5 %  CG4 I-STAT (LACTIC ACID)     Status: Abnormal   Collection Time    09/02/13 12:43 PM      Result Value Range   Lactic Acid, Venous 2.29 (*) 0.5 - 2.2 mmol/L    Ct Abdomen Pelvis W Contrast  09/02/2013   CLINICAL DATA:  Intermittent right lower quadrant pain  EXAM: CT ABDOMEN AND PELVIS WITH CONTRAST  TECHNIQUE: Multidetector CT imaging  of the abdomen and pelvis was performed using the standard protocol following bolus administration of intravenous contrast.  CONTRAST:  80mL OMNIPAQUE IOHEXOL 300 MG/ML  SOLN  COMPARISON:  10/02/2011  FINDINGS: Find sagittal images of the spine shows diffuse osteopenia. Mild degenerative changes. Stable Schmorl's node deformity superior endplate of L3 and L4 vertebral body. Atherosclerotic calcifications of abdominal aorta and iliac arteries are noted. SMA calcifications are noted.  Small hiatal hernia is noted. Mild distended gallbladder without evidence of calcified gallstones. Liver, spleen, pancreas and adrenals are unremarkable. The pancreas is atrophic. Kidneys are symmetrical in size and enhancement. No hydronephrosis or  hydroureter. Delayed renal images shows bilateral renal symmetrical excretion. Bilateral visualized proximal ureter is unremarkable.  There is distension of the right colon with fluid. Distension of transverse colon with gas and fluid.  There are distended small bowel loops with air-fluid levels distal small bowel loops highly suspicious for significant ileus or partial small bowel obstruction.  In axial image 32 there is apple core thickening of colonic wall in splenic flexure of the colon. This is highly suspicious for colonic malignancy best visualized on coronal image 34.  Further correlation with colonoscopy is recommended. Nonspecific mild thickening of urinary bladder wall. The urinary bladder has a lobulated contour. The uterus and adnexa are not identified. Atherosclerotic calcifications of iliac arteries. No distal colonic obstruction. The sigmoid colon is empty partially collapsed.  IMPRESSION: 1. There is distension of the right colon and transverse colon with air-fluid level. Distended distal small bowel loops with air-fluid level suspicious for ileus or partial small bowel obstruction. There is a apple core thickening of the colonic wall in the splenic flexure of the colon axial image 31 and 32 highly suspicious for colonic malignancy. Further correlation with colonoscopy is recommended. 2. No hydronephrosis or hydroureter. These results were called by telephone at the time of interpretation on 09/02/2013 at 4:22 PM to Dr. Derwood Kaplan , who verbally acknowledged these results.   Electronically Signed   By: Natasha Mead M.D.   On: 09/02/2013 16:23   Dg Abd Acute W/chest  09/02/2013   CLINICAL DATA:  Abdominal pain, vomiting and weakness.  EXAM: ACUTE ABDOMEN SERIES (ABDOMEN 2 VIEW & CHEST 1 VIEW)  COMPARISON:  Chest x-ray 10/01/2011.  FINDINGS: The upright chest x-ray demonstrate stable tortuosity, ectasia and calcification of the thoracic aorta. The heart is normal in size. No acute pulmonary  findings.  Two views of the abdomen demonstrated dilated small bowel loops and colon with scattered air-fluid levels. Findings could be due to gastroenteritis or ileus. No free air. The bony structures are intact. Advanced vascular calcifications are noted.  IMPRESSION: No acute cardiopulmonary findings.  Air-filled small bowel loops and colon with mild distention possibly due to gastroenteritis or ileus. No free air.   Electronically Signed   By: Loralie Champagne M.D.   On: 09/02/2013 14:10    Review of Systems  Constitutional: Positive for malaise/fatigue.  HENT: Negative.   Eyes: Negative.   Respiratory: Negative.   Cardiovascular: Negative.   Gastrointestinal: Positive for vomiting, abdominal pain and constipation.  Genitourinary: Positive for dysuria.  Musculoskeletal: Negative.   Skin: Negative.   Neurological: Positive for weakness.  Psychiatric/Behavioral: Negative.    Blood pressure 156/54, pulse 87, temperature 98.4 F (36.9 C), temperature source Oral, resp. rate 20, SpO2 98.00%. Physical Exam  Constitutional: She is oriented to person, place, and time. She appears well-developed and well-nourished.  HENT:  Head: Normocephalic and atraumatic.  Eyes: Pupils are  equal, round, and reactive to light. No scleral icterus.  Neck: Normal range of motion. Neck supple.  Cardiovascular: Normal rate and regular rhythm.   Respiratory: Effort normal.  GI: Soft. Bowel sounds are normal. She exhibits distension. There is tenderness. There is no rebound and no guarding.  Musculoskeletal: Normal range of motion.  Neurological: She is alert and oriented to person, place, and time.  Skin: Skin is warm and dry.  Psychiatric: She has a normal mood and affect. Her behavior is normal. Judgment and thought content normal.    Assessment/Plan: COLONIC OBSTRUCTION.LESS LIKELY ILEUS/SBO.  NEEDS COLONOSCOPY BY GI.  NO ACUTE SURGICAL NEED.  WILL FOLLOW  Cantrell Larouche A. 09/02/2013, 7:52 PM

## 2013-09-02 NOTE — ED Notes (Signed)
Informed CT that pt is done drinking the contrast media.

## 2013-09-02 NOTE — H&P (Signed)
Family Medicine Teaching [facility] Admission History and Physical Service Pager: [phone]  Patient name: [name] Medical record number: [id number] Date of birth: [birth year] Age: 79 y.o. Gender: female  Primary Care Provider: Kaleen Mask, MD Consultants: General Surgery Code Status: Full  Chief Complaint: abdominal pain, vomiting   Assessment and Plan: [name] is a 79 y.o. female presenting with abdominal pain, nausea and vomiting consistent with ileus and with possible bowel obstruction. PMH is significant for DM type 2, left sided CVA, atrial fibrillation, and HTN.   # Abdominal Pain/Distention - Ileus/SBO of small bowel as well as ileus of colon with thickening of colon at splenic flexure concerning for mass noted on CT scan, also possibly related to adhesions from abdominal hysterectomy in 1980s; Of note [name] consult note in 2011 states that pt has a history of SBO, but pt and daughter deny this at present; Most importantly patient without an acute abdomen on exam - Maintain NPO and start maintenance IVF - Place NG tube as needed if vomiting persists - Try to minimize opiate use - Consult to general surgery for management recommendations - Consult [name] tomorrow AM for consideration of eval of colon mass after ileus resolved  # Chronic Constipation - Pt recently went 2 weeks without BM, likely due to daily opiate use and being completely sedentary - Needs to start bowel regimen when tolerating PO  - Given suppository as long as no SBO  # Urinary Tract Infection - U/A showing + leukocytes and bacteria; previous UTI in July 214 was E. Coli sensitive to CTX  - S/p 1 gm CTX in ED - Cont CTX 1 g q 24 while not taking PO - F/u culture     # Atrial Fibrillation - Patient denies known history and not on anticoagulation (CHADSVASC score 9); EKG showed A-fib on admission and present on multiple EKG's in 2013  - Hemodynamically stable and history of [name] bleeding  in 2011, so no anticoagulation at this time (HAS Bled Score 5) - Continue to monitor on telemetry   # HFpEF - Grade 1 diastolic dysfunction per Echo 1478; Pt denies knowledge of this problem but numerous discharge summaries mention this and pt was taking toprol and lisinopril in past; No current evidence of volume overload  - Consider repeat echo and starting beta blocker and ACE-I as tolerated  # COPD - Hx of taking Spiriva and Albuterol, but currently taking no meds; No evience of COPD on exam with good air movement and no wheezing - Use albuterol inhaler PRN   # Tobacco Abuse - current daily smoke, pack years > 50 - Nicotine patch PRN  # Diabetes Mellitus - Pt claims her diabetes "went away" and has not been on medication for years - Check A1C - Start q 4 sliding scale  # Cerebrovascular Disease - s/p left sided CVA 2008; History of taking Aggrenox but stopped in 2011 after upper [name] bleed from multiple bleeding ulcers and started Plavix, but not currently taking any meds for this problem  - Given history of stroke, pt likely needs to restart Plavix   # HLD - Previously taking simvastatin 40 mg, but no on any medications - Pt needs to be on Lipitor 80 mg given history of stroke - Start when ileus resolved   # Memory Deficits - Pt admitted memory loss, not oriented to time; likely component of vascular dementia  - Consider MMSE during admission - Ask questions of daughter [name] [phone])  [phone]  FEN/[name]: NPO, NS for maintenance IVF and consider dextrose as needed Prophylaxis: Heparin SQ TID  Disposition: Admit to Family Medicine inpatient service   History of Present Illness: [name] is a 79 y.o. female presenting with abdominal pain. This is acute on chronic with worsening pain on the right side since 1Am last night. It was accompanied by nausea and vomiting that was initially food and the progressed to a dark liquid. She cannot remember the number of time that she  vomited. She notes chronic constipation with 2 bowel movements in last 14 days, the most recent 4 days ago. She is passing gas. She denies chest pain, shortness of breath, fever or chills. She has chronic pain of her RUE that she attributes to her stroke in 2008. This stroke also left her without the ability to walk due to weakness of the right side. The patient also has numerous other health problems including HTN, HLD, DM and A-fib for which she is not being treated. She and her daughter note that she stopped taking "all those medicines about four years ago," because she felt that she did not need them. She does follow up with her PCP regularly.    Review Of Systems: Per HPI with the following additions: none Otherwise 12 point review of systems was performed and was unremarkable.  There are no active problems to display for this patient.  Past Medical History: Past Medical History  Diagnosis Date  . COPD (chronic obstructive pulmonary disease)     from inpatient chart  . CHF (congestive heart failure)   . Hypertension   . Stroke     rt side deficit  . A-fib   . Diabetes mellitus   . Back pain   . Constipation    Past Surgical History: History reviewed. No pertinent past surgical history. Social History: History  Substance Use Topics  . Smoking status: Current Every Day Smoker  . Smokeless tobacco: Not on file  . Alcohol Use: Yes   Additional social history: Lives with daughter Please also refer to relevant sections of EMR.  Family History: No family history on file. Allergies and Medications: Allergies  Allergen Reactions  . Aggrenox [Aspirin-Dipyridamole Er] Diarrhea and Nausea And Vomiting   No current facility-administered medications on file prior to encounter.   Current Outpatient Prescriptions on File Prior to Encounter  Medication Sig Dispense Refill  . Docusate Calcium (STOOL SOFTENER PO) Take 2 capsules by mouth daily as needed. For constipation      .  morphine (MS CONTIN) 30 MG 12 hr tablet Take 30 mg by mouth 2 (two) times daily.        . traMADol (ULTRAM) 50 MG tablet Take 100 mg by mouth 2 (two) times daily.      . traZODone (DESYREL) 150 MG tablet Take 150 mg by mouth at bedtime.          Objective: BP 160/63  Pulse 85  Temp(Src) 98.5 F (36.9 C) (Oral)  Resp 15  SpO2 98% Exam: General: elderly, chronically ill appearing, non distressed  HEENT: NCAT, PERRA, EOMI, op clear and moist, edentulous  Cardiovascular: irreg irreg, distant heart sounds Respiratory: normal WOB, CTA-B Abdomen: distended, tender on right lateral side without guarding or rebounds, high pitched bowel sounds Extremities: contracture of right hand and foot, no edema or tenderness Skin: no lesions or rashes Neuro: alert and oriented to person and location but not time; permanent deficits of right upper and lower extremities  Labs and Imaging: CBC BMET   Recent Labs Lab 09/02/13 1220 09/02/13 1242  WBC 14.5*  --   HGB 13.0 15.0  HCT 39.4 44.0  PLT 471*  --     Recent Labs Lab 09/02/13 1242  NA 136  K 4.0  CL 104  BUN 26*  CREATININE 1.20*  GLUCOSE 160*     CT Scan Abdomen 09/02/13 There is distension of the right colon and transverse colon with air-fluid level. Distended distal small bowel loops with air-fluid level suspicious for ileus or partial small bowel obstruction. There is a apple core thickening of the colonic wall in the splenic flexure of the colon axial image 31 and 32 highly suspicious for  colonic malignancy. Further correlation with colonoscopy is recommended.   Garnetta Buddy, MD 09/02/2013, 4:56 PM PGY-3, [facility] FPTS Intern pager: [phone], text pages welcome

## 2013-09-02 NOTE — ED Notes (Signed)
NOTIFIED DR. NANAVATI IN PERSON OF PATIENTS LAB RESULTS OF CG4 LACTIC ACID = 2.29 mmoI/L/, @13 :05PM ,09/02/2013.

## 2013-09-02 NOTE — ED Notes (Signed)
Daughter reports pt just finished taking a 14 day supply of Sulfa for a UTI. Pt reports being incontinent. States, "I pee on myself all the time".

## 2013-09-03 DIAGNOSIS — E119 Type 2 diabetes mellitus without complications: Secondary | ICD-10-CM

## 2013-09-03 DIAGNOSIS — E785 Hyperlipidemia, unspecified: Secondary | ICD-10-CM

## 2013-09-03 DIAGNOSIS — I10 Essential (primary) hypertension: Secondary | ICD-10-CM

## 2013-09-03 DIAGNOSIS — K56609 Unspecified intestinal obstruction, unspecified as to partial versus complete obstruction: Secondary | ICD-10-CM

## 2013-09-03 DIAGNOSIS — N390 Urinary tract infection, site not specified: Secondary | ICD-10-CM

## 2013-09-03 DIAGNOSIS — K560 Paralytic ileus: Secondary | ICD-10-CM

## 2013-09-03 LAB — COMPREHENSIVE METABOLIC PANEL
AST: 8 U/L (ref 0–37)
Albumin: 2.9 g/dL — ABNORMAL LOW (ref 3.5–5.2)
BUN: 20 mg/dL (ref 6–23)
CO2: 22 mEq/L (ref 19–32)
Calcium: 8.6 mg/dL (ref 8.4–10.5)
Chloride: 102 mEq/L (ref 96–112)
Creatinine, Ser: 1.03 mg/dL (ref 0.50–1.10)
GFR calc Af Amer: 57 mL/min — ABNORMAL LOW (ref >90)
GFR calc non Af Amer: 50 mL/min — ABNORMAL LOW (ref >90)
Total Bilirubin: 0.3 mg/dL (ref 0.3–1.2)

## 2013-09-03 LAB — CBC
HCT: 36.7 % (ref 36.0–46.0)
MCH: 27.6 pg (ref 26.0–34.0)
MCV: 84.4 fL (ref 78.0–100.0)
Platelets: 398 10*3/uL (ref 150–400)
RBC: 4.35 MIL/uL (ref 3.87–5.11)
RDW: 15.6 % — ABNORMAL HIGH (ref 11.5–15.5)

## 2013-09-03 LAB — HEMOGLOBIN A1C
Hgb A1c MFr Bld: 7.2 % — ABNORMAL HIGH (ref <5.7)
Mean Plasma Glucose: 160 mg/dL — ABNORMAL HIGH (ref <117)

## 2013-09-03 LAB — PRO B NATRIURETIC PEPTIDE: Pro B Natriuretic peptide (BNP): 1298 pg/mL — ABNORMAL HIGH (ref 0–450)

## 2013-09-03 MED ORDER — SODIUM CHLORIDE 0.9 % IV SOLN: INTRAVENOUS | Status: DC

## 2013-09-03 MED ORDER — DOCUSATE SODIUM 100 MG PO CAPS: 100.0000 mg | ORAL_CAPSULE | Freq: Two times a day (BID) | ORAL | Status: DC | PRN

## 2013-09-03 NOTE — Progress Notes (Signed)
General Surgery [facility]  Patient seen and examined.  Daughter at bedside.  CT scan with "apple core" lesion in colon.  Colonoscopy planned tomorrow to evaluate.  Suspect malignancy.  Will follow with you.  Recommendations pending results of colonoscopy.  Velora Heckler, MD, [facility] Office: [phone]

## 2013-09-03 NOTE — Consult Note (Signed)
Referring Provider: Dr. Gwendolyn Grant Primary Care Physician:  Kaleen Mask, MD Primary Gastroenterologist:  Dr. Randa Evens  Reason for Consultation:  Colon mass  HPI: [name] is a 79 y.o. female with long history of RLQ pain and worsening constipation. Recent onset of profuse N/V and more diffuse abdominal pain and came to the hospital and found to have a colonic obstruction concerning for a colon mass (see below). Denies any rectal bleeding, hematochezia, or melena. Pain and constipation has been occurring for over a month. She is unsure if she ever had a colonoscopy. EGD in 2011 by Dr. Randa Evens showed a duodenal ulcer. Denies any unintentional weight loss and states that she has been trying to diet for years.  Past Medical History  Diagnosis Date  . COPD (chronic obstructive pulmonary disease)   . CHF (congestive heart failure)     diastolic  . Hypertension   . Stroke 2008    rt side deficit  . A-fib   . Diabetes mellitus   . Back pain   . Constipation   . SBO (small bowel obstruction)     per GI note in 2011, pt denies  . Upper GI bleed 2011    required hospitalization  . Colon polyps     Past Surgical History  Procedure Laterality Date  . Total abdominal hysterectomy  1980    Prior to Admission medications   Medication Sig Start Date End Date Taking? Authorizing Provider  Docusate Calcium (STOOL SOFTENER PO) Take 2 capsules by mouth daily as needed. For constipation   Yes Historical Provider, MD  morphine (MS CONTIN) 30 MG 12 hr tablet Take 30 mg by mouth 2 (two) times daily.     Yes Historical Provider, MD  nitroGLYCERIN (NITROSTAT) 0.4 MG SL tablet Place 0.4 mg under the tongue every 5 (five) minutes as needed for chest pain.   Yes Historical Provider, MD  nortriptyline (PAMELOR) 25 MG capsule Take 100 mg by mouth at bedtime.   Yes Historical Provider, MD  traMADol (ULTRAM) 50 MG tablet Take 100 mg by mouth 2 (two) times daily.   Yes Historical Provider, MD   traZODone (DESYREL) 150 MG tablet Take 150 mg by mouth at bedtime.     Yes Historical Provider, MD  sulfamethoxazole-trimethoprim (BACTRIM DS,SEPTRA DS) 800-160 MG per tablet Take 1 tablet by mouth 2 (two) times daily. For 14 days 08/23/13   Historical Provider, MD    Scheduled Meds: . cefTRIAXone (ROCEPHIN)  IV  1 g Intravenous Q24H  . heparin  5,000 Units Subcutaneous Q8H   Continuous Infusions: . sodium chloride 100 mL/hr at 09/03/13 0812   PRN Meds:.morphine injection, ondansetron (ZOFRAN) IV  Allergies as of 09/02/2013 - Review Complete 09/02/2013  Allergen Reaction Noted  . Aggrenox [aspirin-dipyridamole er] Diarrhea and Nausea And Vomiting 11/06/2010    History reviewed. No pertinent family history.  History   Social History  . Marital Status: Widowed    Spouse Name: N/A    Number of Children: N/A  . Years of Education: N/A   Occupational History  . Not on file.   Social History Main Topics  . Smoking status: Current Every Day Smoker -- 0.50 packs/day for 15 years    Types: Cigarettes  . Smokeless tobacco: Not on file  . Alcohol Use: Yes  . Drug Use: No  . Sexual Activity: No   Other Topics Concern  . Not on file   Social History Narrative  . No narrative on file  Review of Systems: All negative from GI standpoint except as stated above in HPI.  Physical Exam: Vital signs: Filed Vitals:   09/03/13 0800  BP: 156/44  Pulse: 75  Temp: 99.2 F (37.3 C)  Resp: 20   Last BM Date: 09/02/13 General:  Elderly, frail, obese, Alert, no acute distress HEENT: anicteric Neck: supple, nontender Lungs:  Clear throughout to auscultation.   No wheezes, crackles, or rhonchi. No acute distress. Heart:  Regular rate and rhythm; no murmurs, clicks, rubs,  or gallops. Abdomen: diffusely tender (greatest in RLQ), distended, +BS, obese  Rectal:  Deferred Ext: no edema  GI:  Lab Results:  Recent Labs  09/02/13 1220 09/02/13 1242 09/03/13 0638  WBC 14.5*   --  10.3  HGB 13.0 15.0 12.0  HCT 39.4 44.0 36.7  PLT 471*  --  398   BMET  Recent Labs  09/02/13 1242 09/02/13 2010 09/03/13 0638  NA 136  --  134*  K 4.0  --  4.4  CL 104  --  102  CO2  --   --  22  GLUCOSE 160*  --  119*  BUN 26*  --  20  CREATININE 1.20* 1.01 1.03  CALCIUM  --   --  8.6   LFT  Recent Labs  09/02/13 1220 09/03/13 0638  PROT 7.6 6.7  ALBUMIN 3.3* 2.9*  AST 9 8  ALT <5 5  ALKPHOS 67 58  BILITOT 0.3 0.3  BILIDIR <0.1  --   IBILI NOT CALCULATED  --    PT/INR  Recent Labs  09/02/13 1220  LABPROT 13.5  INR 1.05     Studies/Results: Ct Abdomen Pelvis W Contrast  09/02/2013   CLINICAL DATA:  Intermittent right lower quadrant pain  EXAM: CT ABDOMEN AND PELVIS WITH CONTRAST  TECHNIQUE: Multidetector CT imaging of the abdomen and pelvis was performed using the standard protocol following bolus administration of intravenous contrast.  CONTRAST:  80mL OMNIPAQUE IOHEXOL 300 MG/ML  SOLN  COMPARISON:  10/02/2011  FINDINGS: Find sagittal images of the spine shows diffuse osteopenia. Mild degenerative changes. Stable Schmorl's node deformity superior endplate of L3 and L4 vertebral body. Atherosclerotic calcifications of abdominal aorta and iliac arteries are noted. SMA calcifications are noted.  Small hiatal hernia is noted. Mild distended gallbladder without evidence of calcified gallstones. Liver, spleen, pancreas and adrenals are unremarkable. The pancreas is atrophic. Kidneys are symmetrical in size and enhancement. No hydronephrosis or hydroureter. Delayed renal images shows bilateral renal symmetrical excretion. Bilateral visualized proximal ureter is unremarkable.  There is distension of the right colon with fluid. Distension of transverse colon with gas and fluid.  There are distended small bowel loops with air-fluid levels distal small bowel loops highly suspicious for significant ileus or partial small bowel obstruction.  In axial image 32 there is apple  core thickening of colonic wall in splenic flexure of the colon. This is highly suspicious for colonic malignancy best visualized on coronal image 34.  Further correlation with colonoscopy is recommended. Nonspecific mild thickening of urinary bladder wall. The urinary bladder has a lobulated contour. The uterus and adnexa are not identified. Atherosclerotic calcifications of iliac arteries. No distal colonic obstruction. The sigmoid colon is empty partially collapsed.  IMPRESSION: 1. There is distension of the right colon and transverse colon with air-fluid level. Distended distal small bowel loops with air-fluid level suspicious for ileus or partial small bowel obstruction. There is a apple core thickening of the colonic wall in the splenic flexure  of the colon axial image 31 and 32 highly suspicious for colonic malignancy. Further correlation with colonoscopy is recommended. 2. No hydronephrosis or hydroureter. These results were called by telephone at the time of interpretation on 09/02/2013 at 4:22 PM to Dr. Derwood Kaplan , who verbally acknowledged these results.   Electronically Signed   By: Natasha Mead M.D.   On: 09/02/2013 16:23   Dg Abd Acute W/chest  09/02/2013   CLINICAL DATA:  Abdominal pain, vomiting and weakness.  EXAM: ACUTE ABDOMEN SERIES (ABDOMEN 2 VIEW & CHEST 1 VIEW)  COMPARISON:  Chest x-ray 10/01/2011.  FINDINGS: The upright chest x-ray demonstrate stable tortuosity, ectasia and calcification of the thoracic aorta. The heart is normal in size. No acute pulmonary findings.  Two views of the abdomen demonstrated dilated small bowel loops and colon with scattered air-fluid levels. Findings could be due to gastroenteritis or ileus. No free air. The bony structures are intact. Advanced vascular calcifications are noted.  IMPRESSION: No acute cardiopulmonary findings.  Air-filled small bowel loops and colon with mild distention possibly due to gastroenteritis or ileus. No free air.   Electronically  Signed   By: Loralie Champagne M.D.   On: 09/02/2013 14:10    Impression/Plan: 79 yo with CT evidence of an apple-core thickening in the splenic flexure with proximal large and small bowel loops concerning for a colon cancer source. Needs enemas today and colonoscopy tomorrow to further evaluate. Patient agreeable to the plan. Keep NPO. NG in place. No oral lavage due to partial obstruction.    LOS: 1 day   Joelle Flessner C.  09/03/2013, 12:07 PM

## 2013-09-03 NOTE — Progress Notes (Signed)
1000 cc tap water enema given, moderate amount of formed brown stool returned.  Gave another 1000 cc tap water enema that ran clear.

## 2013-09-03 NOTE — Progress Notes (Signed)
Subjective: Pt notes significant improvement.  Had flatus and BM yesterday and had a BM as noted on exam this am.  No abdominal pain or N/V.  Wants to ambulate OOB.  NG output  Objective: Vital signs in last 24 hours: Temp:  [98 F (36.7 C)-99.2 F (37.3 C)] 99.2 F (37.3 C) (12/08 0800) Pulse Rate:  [75-98] 75 (12/08 0800) Resp:  [12-25] 20 (12/08 0800) BP: (113-162)/(44-118) 156/44 mmHg (12/08 0800) SpO2:  [96 %-100 %] 97 % (12/08 0800) Weight:  [165 lb 12.6 oz (75.2 kg)] 165 lb 12.6 oz (75.2 kg) (12/07 1830) Last BM Date: 09/02/13  Intake/Output from previous day:   Intake/Output this shift:    PE: Gen:  Alert, NAD, pleasant Abd: Obese, soft, NT/ND, +BS, no HSM, lower abdominal hysterectomy scar noted  Lab Results:   Recent Labs  09/02/13 1220 09/02/13 1242 09/03/13 0638  WBC 14.5*  --  10.3  HGB 13.0 15.0 12.0  HCT 39.4 44.0 36.7  PLT 471*  --  398   BMET  Recent Labs  09/02/13 1242 09/02/13 2010 09/03/13 0638  NA 136  --  134*  K 4.0  --  4.4  CL 104  --  102  CO2  --   --  22  GLUCOSE 160*  --  119*  BUN 26*  --  20  CREATININE 1.20* 1.01 1.03  CALCIUM  --   --  8.6   PT/INR  Recent Labs  09/02/13 1220  LABPROT 13.5  INR 1.05   CMP     Component Value Date/Time   NA 134* 09/03/2013 0638   K 4.4 09/03/2013 0638   CL 102 09/03/2013 0638   CO2 22 09/03/2013 0638   GLUCOSE 119* 09/03/2013 0638   BUN 20 09/03/2013 0638   CREATININE 1.03 09/03/2013 0638   CALCIUM 8.6 09/03/2013 0638   PROT 6.7 09/03/2013 0638   ALBUMIN 2.9* 09/03/2013 0638   AST 8 09/03/2013 0638   ALT 5 09/03/2013 0638   ALKPHOS 58 09/03/2013 0638   BILITOT 0.3 09/03/2013 0638   GFRNONAA 50* 09/03/2013 0638   GFRAA 57* 09/03/2013 0638   Lipase     Component Value Date/Time   LIPASE 19 09/02/2013 1220       Studies/Results: Ct Abdomen Pelvis W Contrast  09/02/2013   CLINICAL DATA:  Intermittent right lower quadrant pain  EXAM: CT ABDOMEN AND PELVIS WITH CONTRAST   TECHNIQUE: Multidetector CT imaging of the abdomen and pelvis was performed using the standard protocol following bolus administration of intravenous contrast.  CONTRAST:  80mL OMNIPAQUE IOHEXOL 300 MG/ML  SOLN  COMPARISON:  10/02/2011  FINDINGS: Find sagittal images of the spine shows diffuse osteopenia. Mild degenerative changes. Stable Schmorl's node deformity superior endplate of L3 and L4 vertebral body. Atherosclerotic calcifications of abdominal aorta and iliac arteries are noted. SMA calcifications are noted.  Small hiatal hernia is noted. Mild distended gallbladder without evidence of calcified gallstones. Liver, spleen, pancreas and adrenals are unremarkable. The pancreas is atrophic. Kidneys are symmetrical in size and enhancement. No hydronephrosis or hydroureter. Delayed renal images shows bilateral renal symmetrical excretion. Bilateral visualized proximal ureter is unremarkable.  There is distension of the right colon with fluid. Distension of transverse colon with gas and fluid.  There are distended small bowel loops with air-fluid levels distal small bowel loops highly suspicious for significant ileus or partial small bowel obstruction.  In axial image 32 there is apple core thickening of colonic wall in splenic flexure  of the colon. This is highly suspicious for colonic malignancy best visualized on coronal image 34.  Further correlation with colonoscopy is recommended. Nonspecific mild thickening of urinary bladder wall. The urinary bladder has a lobulated contour. The uterus and adnexa are not identified. Atherosclerotic calcifications of iliac arteries. No distal colonic obstruction. The sigmoid colon is empty partially collapsed.  IMPRESSION: 1. There is distension of the right colon and transverse colon with air-fluid level. Distended distal small bowel loops with air-fluid level suspicious for ileus or partial small bowel obstruction. There is a apple core thickening of the colonic wall in  the splenic flexure of the colon axial image 31 and 32 highly suspicious for colonic malignancy. Further correlation with colonoscopy is recommended. 2. No hydronephrosis or hydroureter. These results were called by telephone at the time of interpretation on 09/02/2013 at 4:22 PM to Dr. Derwood Kaplan , who verbally acknowledged these results.   Electronically Signed   By: Natasha Mead M.D.   On: 09/02/2013 16:23   Dg Abd Acute W/chest  09/02/2013   CLINICAL DATA:  Abdominal pain, vomiting and weakness.  EXAM: ACUTE ABDOMEN SERIES (ABDOMEN 2 VIEW & CHEST 1 VIEW)  COMPARISON:  Chest x-ray 10/01/2011.  FINDINGS: The upright chest x-ray demonstrate stable tortuosity, ectasia and calcification of the thoracic aorta. The heart is normal in size. No acute pulmonary findings.  Two views of the abdomen demonstrated dilated small bowel loops and colon with scattered air-fluid levels. Findings could be due to gastroenteritis or ileus. No free air. The bony structures are intact. Advanced vascular calcifications are noted.  IMPRESSION: No acute cardiopulmonary findings.  Air-filled small bowel loops and colon with mild distention possibly due to gastroenteritis or ileus. No free air.   Electronically Signed   By: Loralie Champagne M.D.   On: 09/02/2013 14:10    Anti-infectives: Anti-infectives   Start     Dose/Rate Route Frequency Ordered Stop   09/03/13 1400  cefTRIAXone (ROCEPHIN) 1 g in dextrose 5 % 50 mL IVPB     1 g 100 mL/hr over 30 Minutes Intravenous Every 24 hours 09/02/13 1916     09/02/13 1345  cefTRIAXone (ROCEPHIN) 1 g in dextrose 5 % 50 mL IVPB     1 g 100 mL/hr over 30 Minutes Intravenous  Once 09/02/13 1335 09/02/13 1643       Assessment/Plan Abdominal pain and distension - much improved Colonic obstruction vs illeus Chronic constipation Chronic RLQ pain   Plan: 1.  No acute surgical needs, continue conservative management (IVF, pain control, antiemetics) 2.  Needs GI workup including  colonoscopy since last one was >15 years ago and change in stool caliber and chronic constipation 3.  Ambulate and IS 4.  SCD's and heparin 5.  NG output not being recorded, but about in the canister now.  Closely monitor output today, but may be able to start clamping trials given improvement.        LOS: 1 day    Aris Georgia 09/03/2013, 10:39 AM Pager: [phone]

## 2013-09-03 NOTE — H&P (Signed)
FMTS Attending Admission Note: Renold Don MD Personal pager:  [phone] FPTS Service Pager:  [phone]  I  have seen and examined this patient, reviewed their chart. I have discussed this patient with the resident. I agree with the resident's findings, assessment and care plan.  Additionally:  79 yo with multiple chronic medical conditions, none of which have been treated for some time, presenting with abdominal pain and distension and found to have SBO plus ileus.  Admitted to FPTS, improvement overnight with passage of both flatus and stool.  Feels improved this AM, still with some mild abdominal pain RLQ.  Found to have "apple-core" lesion on CT, last colonoscopy was years ago (if at all, daughter and patient do not remember if she actually had one).    Exam: Gen:  Elderly female, lying in bed.  Awake and alert, NAD Heart:  RRR Lungs:  Clear anterior chest Abdomen:  Soft.  Minimally distended.  TTP only with deep palpation RLQ and some RUQ.  No guarding or rebound Ext:  No edema  Imp/Plan: 1.  Abdomina pain - Improving.  Also now with passage of stool and flatus - Appreciate Surgery input - Agree with GI consult and colonoscopy. - Ambulate out of bed.      2.  A fib: - Needs anticoagulation.  But will need to include patient in this decision.  Sounds like she has not been taking any medications for almost a year based on personal choice -- wonder if she will want long-term anticoagulation - Also with rather high bleed risk, prior history of the same.    Tobey Grim, MD 09/03/2013 11:31 AM

## 2013-09-03 NOTE — Progress Notes (Signed)
Family Medicine Teaching Service Daily Progress Note Intern Pager: [phone]  Patient name: [name] Medical record number: [id number] Date of birth: 08/10/1930 Age: 79 y.o. Gender: female  Primary Care Provider: Kaleen Mask, MD Consultants: General Surgery, GI Code Status: Full  Pt Overview and Major Events to Date:  12/7 - CT Abd showing distended R-colon, ileus / partial SBO, "apple-core lesion" suspicious for malignancy 12/8 c/s GI --> plan for colonoscopy 12/9  Assessment and Plan:  [name] is a 79 y.o. female presenting with abdominal pain, nausea and vomiting consistent with ileus and with possible bowel obstruction. PMH is significant for DM type 2, left sided CVA, atrial fibrillation, and HTN.  # Abdominal Pain/Distention - Ileus/SBO of small bowel as well as ileus of colon with thickening of colon at splenic flexure concerning for mass noted on CT scan, also possibly related to adhesions from abdominal hysterectomy in 1980s; Of note GI consult note in 2011 states that pt has a history of SBO, but pt and daughter deny this at present; No acute abdomen on admission. Currently abdominal pain improved today (2/10) mostly RLQ, NGT in place (d/t vomiting) with moderate amount fluid suctioned. - c/s General Surgery - appreciate all recs     - No acute surgical intervention at this time. Continue to follow-up work-up for possible colon CA     - consider clamping NGT if improvement and decreased output - c/s GI ([name]) - greatly appreciate all recommendations in patient's care      - concern for colon CA      - plan for colonoscopy tomorrow 12/9      - enemas for prep, NPO, keep NGT - Try to minimize opiate use with ileus  # Chronic Constipation - Pt recently went 2 weeks without BM, likely due to daily opiate use and being completely sedentary. Reportedly had multiple BM o/n. Today enemas for colonoscopy prep successful. - Needs to start bowel regimen when  tolerating PO  - Given suppository as long as no SBO  # Urinary Tract Infection - U/A showing + leukocytes and bacteria; previous UTI in July 214 was E. Coli sensitive to CTX  - S/p 1 gm CTX in ED  - Cont CTX 1 g q 24 while not taking PO  - F/u culture  # Atrial Fibrillation - Patient denies known history and not on anticoagulation (CHADSVASC score 9); EKG showed A-fib on admission and present on multiple EKG's in 2013  - Hemodynamically stable and history of GI bleeding in 2011, so no anticoagulation at this time (HAS Bled Score 5)  - Continue to monitor on telemetry  # HFpEF - Grade 1 diastolic dysfunction per Echo 1478; Pt denies knowledge of this problem but numerous discharge summaries mention this and pt was taking toprol and lisinopril in past; No current evidence of volume overload  - Consider repeat echo and starting beta blocker and ACE-I as tolerated  # COPD - Hx of taking Spiriva and Albuterol, but currently taking no meds; No evience of COPD on exam with good air movement and no wheezing  - Use albuterol inhaler PRN  # Tobacco Abuse - current daily smoke, pack years > 50  - Nicotine patch PRN  # Diabetes Mellitus - Pt claims her diabetes "went away" and has not been on medication for years  - Hgb A1c 7.2 (09/03/13) - Q4AC CBGs - SSI  # Cerebrovascular Disease - s/p left sided CVA 2008; History of taking Aggrenox but stopped  in 2011 after upper GI bleed from multiple bleeding ulcers and started Plavix, but not currently taking any meds for this problem  - Given history of stroke, pt likely needs to restart Plavix  # HLD - Previously taking simvastatin 40 mg, but no on any medications - Pt needs to be on Lipitor 80 mg given history of stroke  - Start when ileus resolved  # Memory Deficits - Pt admitted memory loss, not oriented to time; likely component of vascular dementia  - Consider MMSE vs MiniCog during admission  - Ask questions of daughter [name] [phone]  FEN/GI: NPO, NS for maintenance IVF and consider dextrose as needed  Prophylaxis: Heparin SQ TID  Disposition: Admit to [facility] Medicine inpatient service, pending further diagnostic work-up and treatment, pending improved clinical course, expect patient to be discharged to home in 2-3 days  Subjective: No acute events overnight. Reported that [name] had multiple BMs. She is resting in bed this morning and states that she overall feels "much better" than when she came in, significantly improved abdominal pain only R-sided now confined mostly to RLQ (2/10) described as aching. Currently has NGT in place with about brown fluid in cannister. Feels like she doesn't have an appetite currently. Otherwise pleasant, but has some difficulties with her memory.   Objective: Temp:  [98 F (36.7 C)-98.7 F (37.1 C)] 98.7 F (37.1 C) (12/08 0613) Pulse Rate:  [79-98] 80 (12/08 0613) Resp:  [12-25] 18 (12/08 0613) BP: (113-162)/(53-118) 151/61 mmHg (12/08 0613) SpO2:  [96 %-100 %] 96 % (12/08 0613) Weight:  [165 lb 12.6 oz (75.2 kg)] 165 lb 12.6 oz (75.2 kg) (12/07 1830) Physical Exam: General: elderly 83 yr F, laying in bed, chronically ill appearing, NGT in place, NAD HEENT: MMM  Cardiovascular: irreg irreg, distant heart sounds, no murmurs heard on exam Respiratory: CTAB, no rhonchi, wheezing, crackles. Normal work of breathing Abdomen: soft, non-distended, +tenderness mostly R>L (localized to RLQ), no significant guarding or rebound, +hypoactive BS in all quads Extremities: contracture of right hand and foot (at baseline), no edema or tenderness  Skin: no lesions or rashes  Neuro: awake, alert, and oriented to person and location but not time; permanent deficits of right upper and lower extremities  Laboratory:  Recent Labs Lab 09/02/13 1220 09/02/13 1242  WBC 14.5*  --   HGB 13.0 15.0  HCT 39.4 44.0  PLT 471*  --     Recent Labs Lab 09/02/13 1220 09/02/13 1242  09/02/13 2010  NA  --  136  --   K  --  4.0  --   CL  --  104  --   BUN  --  26*  --   CREATININE  --  1.20* 1.01  PROT 7.6  --   --   BILITOT 0.3  --   --   ALKPHOS 67  --   --   ALT <5  --   --   AST 9  --   --   GLUCOSE  --  160*  --     HgbA1c 7.2  ProBNP 1298.0 (prior baseline 200s)  PT 13.5 / INR 1.05, APTT 28  12/7 UA Large leuks, neg nitrite, mod hgb, WBC TNTC, rare squam, many bacteria - suggestive of UTI  12/7 Urine Culture (pending)  Imaging/Diagnostic Tests:  12/7 Acute Abd / Chest Xray IMPRESSION:  No acute cardiopulmonary findings.  Air-filled small bowel loops and colon with mild distention possibly  due  to gastroenteritis or ileus. No free air  12/7 CT Abd / Pelvis IMPRESSION:  1. There is distension of the right colon and transverse colon with  air-fluid level. Distended distal small bowel loops with air-fluid  level suspicious for ileus or partial small bowel obstruction. There  is a apple core thickening of the colonic wall in the splenic  flexure of the colon axial image 31 and 32 highly suspicious for  colonic malignancy. Further correlation with colonoscopy is  recommended.  2. No hydronephrosis or hydroureter.   Saralyn Pilar, DO 09/03/2013, 7:25 AM PGY-1, [facility] Family Medicine FPTS Intern pager: [phone], text pages welcome

## 2013-09-03 NOTE — Plan of Care (Signed)
Problem: Phase I Progression Outcomes Goal: OOB as tolerated unless otherwise ordered Outcome: Not Met (add Reason) Patient too weak to stand/Physical therapy ordered

## 2013-09-04 ENCOUNTER — Encounter (HOSPITAL_COMMUNITY): Payer: Self-pay | Admitting: Gastroenterology

## 2013-09-04 ENCOUNTER — Encounter (HOSPITAL_COMMUNITY)
Admission: EM | Disposition: A | Discharge status: Home / Self Care | Payer: Self-pay | Source: Home / Self Care | Attending: Family Medicine

## 2013-09-04 DIAGNOSIS — R1084 Generalized abdominal pain: Secondary | ICD-10-CM | POA: Diagnosis present

## 2013-09-04 HISTORY — PX: COLONOSCOPY: SHX5424

## 2013-09-04 LAB — URINE CULTURE

## 2013-09-04 LAB — GLUCOSE, CAPILLARY
Glucose-Capillary: 113 mg/dL — ABNORMAL HIGH (ref 70–99)
Glucose-Capillary: 94 mg/dL (ref 70–99)
Glucose-Capillary: 89 mg/dL (ref 70–99)

## 2013-09-04 SURGERY — COLONOSCOPY
Anesthesia: Moderate Sedation

## 2013-09-04 MED ORDER — MIDAZOLAM HCL 5 MG/5ML IJ SOLN
INTRAMUSCULAR | Status: DC | PRN
  Administered 2013-09-04 (×3): 2 mg via INTRAVENOUS

## 2013-09-04 MED ORDER — FENTANYL CITRATE 0.05 MG/ML IJ SOLN
INTRAMUSCULAR | Status: AC
  Filled 2013-09-04: qty 2

## 2013-09-04 MED ORDER — SPOT INK MARKER SYRINGE KIT
PACK | SUBMUCOSAL | Status: AC
  Filled 2013-09-04: qty 5

## 2013-09-04 MED ORDER — DIPHENHYDRAMINE HCL 50 MG/ML IJ SOLN
INTRAMUSCULAR | Status: AC
  Filled 2013-09-04: qty 1

## 2013-09-04 MED ORDER — FENTANYL CITRATE 0.05 MG/ML IJ SOLN
INTRAMUSCULAR | Status: DC | PRN
  Administered 2013-09-04 (×4): 25 ug via INTRAVENOUS

## 2013-09-04 MED ORDER — INSULIN ASPART 100 UNIT/ML ~~LOC~~ SOLN
0.0000 [IU] | SUBCUTANEOUS | Status: DC
  Administered 2013-09-05 – 2013-09-06 (×3): 1 [IU] via SUBCUTANEOUS
  Administered 2013-09-06: 2 [IU] via SUBCUTANEOUS
  Administered 2013-09-07: 1 [IU] via SUBCUTANEOUS
  Administered 2013-09-07: 2 [IU] via SUBCUTANEOUS
  Administered 2013-09-07: 1 [IU] via SUBCUTANEOUS

## 2013-09-04 MED ORDER — SODIUM CHLORIDE 0.9 % IV SOLN
INTRAVENOUS | Status: DC
  Administered 2013-09-04: 500 mL via INTRAVENOUS

## 2013-09-04 MED ORDER — PROMETHAZINE HCL 25 MG PO TABS
12.5000 mg | ORAL_TABLET | Freq: Four times a day (QID) | ORAL | Status: DC | PRN
  Administered 2013-09-04: 12.5 mg via ORAL
  Filled 2013-09-04: qty 1

## 2013-09-04 MED ORDER — MIDAZOLAM HCL 5 MG/ML IJ SOLN
INTRAMUSCULAR | Status: AC
  Filled 2013-09-04: qty 2

## 2013-09-04 NOTE — Progress Notes (Signed)
Family Medicine Teaching Service Daily Progress Note Intern Pager: [phone]  Patient name: [name] Medical record number: [id number] Date of birth: Jun 29, 1930 Age: 79 y.o. Gender: female  Primary Care Provider: Kaleen Mask, MD Consultants: General Surgery, GI Code Status: Full  Pt Overview and Major Events to Date:  12/7 - CT Abd showing distended R-colon, ileus / partial SBO, "apple-core lesion" suspicious for malignancy 12/8 - c/s GI --> plan for colonoscopy 12/9 12/9 - scheduled colonoscopy, Gen surg cont to follow (awaiting results - aware of potential need for surgery)  Assessment and Plan:  [name] is a 79 y.o. female presenting with abdominal pain, nausea and vomiting consistent with ileus and with possible bowel obstruction. PMH is significant for DM type 2, left sided CVA, atrial fibrillation, Chronic HF w/ preserved EF, and HTN.  # Abdominal Pain/Distention - Ileus/SBO of small bowel as well as ileus of colon with thickening of colon at splenic flexure concerning for mass noted on CT scan, also possibly related to adhesions from abdominal hysterectomy in 1980s; Of note GI consult note in 2011 states that pt has a history of SBO, but pt and daughter deny this at present; No acute abdomen on admission. Currently abdominal pain improved today (2/10) mostly RLQ, NGT in place (d/t vomiting) with moderate amount fluid suctioned. - c/s General Surgery - greatly appreciate all recs     - No acute surgical intervention at this time. Continue to follow-up work-up for possible colon CA     - continued output from NGT o/n, consider clamping - c/s GI [name]) - greatly appreciate all recs      - concern for colon CA      - plan for colonoscopy today 12/9      - enemas successful for prep, NPO, keep NGT - Try to minimize opiate use with ileus  # Chronic Constipation - Pt recently went 2 weeks without BM, likely due to daily opiate use and being completely  sedentary. Reportedly had multiple BM o/n. Today enemas for colonoscopy prep successful. - plan to start regular bowel regimen when tolerating PO  - Given suppository as long as no SBO  # Urinary Tract Infection, uncomplicated U/A showing + leukocytes and bacteria; previous UTI in July 214 was E. Coli sensitive to CTX. Currently stable, without dysuria, afebrile, VSS. Noted improved odor with urine, expect good response to CTX. - S/p 1 gm CTX in ED (12/8) - continue CTX (Day 2), remains NPO - continue CTX to complete abx course as pt will remain in hospital - Urine culture (E. Coli >100,000CFU, sensitive to CTX, Nitro, tobramycin, Resistant to Cefazolin)  # Atrial Fibrillation - Patient denies known history and not on anticoagulation (CHADSVASC score 9); EKG showed A-fib on admission and present on multiple EKG's in 2013  - Hemodynamically stable and history of GI bleeding in 2011, so no anticoagulation at this time (HAS Bled Score 5)  - Continue to monitor on telemetry - currently HR stable 75-90, consider starting rate control s/p colonoscopy  # Chronic HF, with preserved EF - Grade 1 diastolic dysfunction per Echo 1478; Pt denies knowledge of this problem but numerous discharge summaries mention this and pt was taking toprol and lisinopril in past; No current evidence of volume overload  - Consider repeat echo and starting beta blocker and ACE-I as tolerated  # COPD - Hx of taking Spiriva and Albuterol, but currently taking no meds; No evience of COPD on exam with good air  movement and no wheezing  - Use albuterol inhaler PRN  # Tobacco Abuse - current daily smoke, pack years > 50  - Nicotine patch PRN  # Diabetes Mellitus - Pt claims her diabetes "went away" and has not been on medication for years  - Hgb A1c 7.2 (09/03/13) - Q4 hr CBGs --> resume Q4 hr AC once diet resumed - SSI sesnitive  # Cerebrovascular Disease - s/p left sided CVA 2008; History of taking Aggrenox but stopped in  2011 after upper GI bleed from multiple bleeding ulcers and started Plavix, but not currently taking any meds for this problem  - Given history of stroke, pt likely needs to restart Plavix  # HLD - Previously taking simvastatin 40 mg, but no on any medications - Pt needs to be on Lipitor 80 mg given history of stroke  - Start when ileus resolved  # Memory Deficits / Functional Decline - Pt admitted memory loss, not oriented to time; likely component of vascular dementia  - Consider MMSE vs MiniCog during admission  - Ask questions of daughter [name] [phone] - PT/OT - PT saw on 12/8, pt too weak to stand, continue to follow  FEN/GI: NPO with NGT, NS for maintenance IVF and consider dextrose as needed  Prophylaxis: Heparin SQ TID  Disposition: Admit to [facility] Medicine inpatient service, pending further diagnostic work-up and treatment, pending improved clinical course, expect patient to be discharged to home in 2-3 days  Subjective: No acute events overnight. Reported that she has tolerated bowel prep yesterday and this morning well prior to planned colonoscopy this afternoon. Notes that her abdominal pain is significantly improved from yesterday (Right sided only), does not complain of any returned abd distention. No family members present in room. Plan to revisit future goals later with family. Patient does understand the high likelihood that colonoscopy could diagnose colon cancer.   Objective: Temp:  [98.1 F (36.7 C)-99.2 F (37.3 C)] 98.1 F (36.7 C) (12/09 0517) Pulse Rate:  [75-90] 90 (12/09 0517) Resp:  [15-20] 15 (12/09 0517) BP: (146-179)/(44-60) 156/60 mmHg (12/09 0517) SpO2:  [97 %-98 %] 98 % (12/09 0517) Physical Exam: General: elderly 83 yr F, comfortably laying in bed, NGT in place, NAD HEENT: PERRL, EOMI, MMM Cardiovascular: irreg irreg, distant heart sounds, no murmurs heard on exam Respiratory: CTAB, no rhonchi, wheezing, crackles. Normal work of  breathing Abdomen: soft, non-distended, +tenderness mostly R>L (localized to RLQ), no significant guarding or rebound, +hypoactive BS in all quads Extremities: contracture of right hand and foot (at baseline), no edema or tenderness  Skin: no lesions or rashes  Neuro: awake, alert, and oriented to person and location but not time; permanent deficits of right upper and lower extremities  Laboratory:  Recent Labs Lab 09/02/13 1220 09/02/13 1242 09/03/13 0638  WBC 14.5*  --  10.3  HGB 13.0 15.0 12.0  HCT 39.4 44.0 36.7  PLT 471*  --  398    Recent Labs Lab 09/02/13 1220 09/02/13 1242 09/02/13 2010 09/03/13 0638  NA  --  136  --  134*  K  --  4.0  --  4.4  CL  --  104  --  102  CO2  --   --   --  22  BUN  --  26*  --  20  CREATININE  --  1.20* 1.01 1.03  CALCIUM  --   --   --  8.6  PROT 7.6  --   --  6.7  BILITOT 0.3  --   --  0.3  ALKPHOS 67  --   --  58  ALT <5  --   --  5  AST 9  --   --  8  GLUCOSE  --  160*  --  119*    HgbA1c 7.2  ProBNP 1298.0 (prior baseline 200s)  PT 13.5 / INR 1.05, APTT 28  12/7 UA Large leuks, neg nitrite, mod hgb, WBC TNTC, rare squam, many bacteria - suggestive of UTI  12/7 Urine Culture (pending)  Imaging/Diagnostic Tests:  12/7 Acute Abd / Chest Xray IMPRESSION:  No acute cardiopulmonary findings.  Air-filled small bowel loops and colon with mild distention possibly  due to gastroenteritis or ileus. No free air  12/7 CT Abd / Pelvis IMPRESSION:  1. There is distension of the right colon and transverse colon with  air-fluid level. Distended distal small bowel loops with air-fluid  level suspicious for ileus or partial small bowel obstruction. There  is a apple core thickening of the colonic wall in the splenic  flexure of the colon axial image 31 and 32 highly suspicious for  colonic malignancy. Further correlation with colonoscopy is  recommended.  2. No hydronephrosis or hydroureter.   Saralyn Pilar,  DO 09/04/2013, 6:40 AM PGY-1, [facility] FPTS Intern pager: [phone], text pages welcome

## 2013-09-04 NOTE — Interval H&P Note (Signed)
History and Physical Interval Note:  09/04/2013 1:20 PM  [name]  has presented today for surgery, with the diagnosis of abdominal pain / Mass  The various methods of treatment have been discussed with the patient and family. After consideration of risks, benefits and other options for treatment, the patient has consented to  Procedure(s): COLONOSCOPY (N/A) as a surgical intervention .  The patient's history has been reviewed, patient examined, no change in status, stable for surgery.  I have reviewed the patient's chart and labs.  Questions were answered to the patient's satisfaction.     Yechiel Erny C.

## 2013-09-04 NOTE — Progress Notes (Signed)
I have seen and examined the pt and agree with PA-Dort's progress note.  Pt for colonoscopy today Await findings

## 2013-09-04 NOTE — Progress Notes (Signed)
I discussed with  Dr karamalegos.  I agree with their plans documented in their progress note for today.  

## 2013-09-04 NOTE — H&P (View-Only) (Signed)
Referring Provider: Dr. Walden Primary Care Physician:  ELKINS,WILSON OLIVER, MD Primary Gastroenterologist:  Dr. Edwards  Reason for Consultation:  Colon mass  HPI: [name] is a 79 y.o. female with long history of RLQ pain and worsening constipation. Recent onset of profuse N/V and more diffuse abdominal pain and came to the hospital and found to have a colonic obstruction concerning for a colon mass (see below). Denies any rectal bleeding, hematochezia, or melena. Pain and constipation has been occurring for over a month. She is unsure if she ever had a colonoscopy. EGD in 2011 by Dr. Edwards showed a duodenal ulcer. Denies any unintentional weight loss and states that she has been trying to diet for years.  Past Medical History  Diagnosis Date  . COPD (chronic obstructive pulmonary disease)   . CHF (congestive heart failure)     diastolic  . Hypertension   . Stroke 2008    rt side deficit  . A-fib   . Diabetes mellitus   . Back pain   . Constipation   . SBO (small bowel obstruction)     per GI note in 2011, pt denies  . Upper GI bleed 2011    required hospitalization  . Colon polyps     Past Surgical History  Procedure Laterality Date  . Total abdominal hysterectomy  1980    Prior to Admission medications   Medication Sig Start Date End Date Taking? Authorizing Provider  Docusate Calcium (STOOL SOFTENER PO) Take 2 capsules by mouth daily as needed. For constipation   Yes Historical Provider, MD  morphine (MS CONTIN) 30 MG 12 hr tablet Take 30 mg by mouth 2 (two) times daily.     Yes Historical Provider, MD  nitroGLYCERIN (NITROSTAT) 0.4 MG SL tablet Place 0.4 mg under the tongue every 5 (five) minutes as needed for chest pain.   Yes Historical Provider, MD  nortriptyline (PAMELOR) 25 MG capsule Take 100 mg by mouth at bedtime.   Yes Historical Provider, MD  traMADol (ULTRAM) 50 MG tablet Take 100 mg by mouth 2 (two) times daily.   Yes Historical Provider, MD   traZODone (DESYREL) 150 MG tablet Take 150 mg by mouth at bedtime.     Yes Historical Provider, MD  sulfamethoxazole-trimethoprim (BACTRIM DS,SEPTRA DS) 800-160 MG per tablet Take 1 tablet by mouth 2 (two) times daily. For 14 days 08/23/13   Historical Provider, MD    Scheduled Meds: . cefTRIAXone (ROCEPHIN)  IV  1 g Intravenous Q24H  . heparin  5,000 Units Subcutaneous Q8H   Continuous Infusions: . sodium chloride 100 mL/hr at 09/03/13 0812   PRN Meds:.morphine injection, ondansetron (ZOFRAN) IV  Allergies as of 09/02/2013 - Review Complete 09/02/2013  Allergen Reaction Noted  . Aggrenox [aspirin-dipyridamole er] Diarrhea and Nausea And Vomiting 11/06/2010    History reviewed. No pertinent family history.  History   Social History  . Marital Status: Widowed    Spouse Name: N/A    Number of Children: N/A  . Years of Education: N/A   Occupational History  . Not on file.   Social History Main Topics  . Smoking status: Current Every Day Smoker -- 0.50 packs/day for 15 years    Types: Cigarettes  . Smokeless tobacco: Not on file  . Alcohol Use: Yes  . Drug Use: No  . Sexual Activity: No   Other Topics Concern  . Not on file   Social History Narrative  . No narrative on file      Review of Systems: All negative from GI standpoint except as stated above in HPI.  Physical Exam: Vital signs: Filed Vitals:   09/03/13 0800  BP: 156/44  Pulse: 75  Temp: 99.2 F (37.3 C)  Resp: 20   Last BM Date: 09/02/13 General:  Elderly, frail, obese, Alert, no acute distress HEENT: anicteric Neck: supple, nontender Lungs:  Clear throughout to auscultation.   No wheezes, crackles, or rhonchi. No acute distress. Heart:  Regular rate and rhythm; no murmurs, clicks, rubs,  or gallops. Abdomen: diffusely tender (greatest in RLQ), distended, +BS, obese  Rectal:  Deferred Ext: no edema  GI:  Lab Results:  Recent Labs  09/02/13 1220 09/02/13 1242 09/03/13 0638  WBC 14.5*   --  10.3  HGB 13.0 15.0 12.0  HCT 39.4 44.0 36.7  PLT 471*  --  398   BMET  Recent Labs  09/02/13 1242 09/02/13 2010 09/03/13 0638  NA 136  --  134*  K 4.0  --  4.4  CL 104  --  102  CO2  --   --  22  GLUCOSE 160*  --  119*  BUN 26*  --  20  CREATININE 1.20* 1.01 1.03  CALCIUM  --   --  8.6   LFT  Recent Labs  09/02/13 1220 09/03/13 0638  PROT 7.6 6.7  ALBUMIN 3.3* 2.9*  AST 9 8  ALT <5 5  ALKPHOS 67 58  BILITOT 0.3 0.3  BILIDIR <0.1  --   IBILI NOT CALCULATED  --    PT/INR  Recent Labs  09/02/13 1220  LABPROT 13.5  INR 1.05     Studies/Results: Ct Abdomen Pelvis W Contrast  09/02/2013   CLINICAL DATA:  Intermittent right lower quadrant pain  EXAM: CT ABDOMEN AND PELVIS WITH CONTRAST  TECHNIQUE: Multidetector CT imaging of the abdomen and pelvis was performed using the standard protocol following bolus administration of intravenous contrast.  CONTRAST:  80mL OMNIPAQUE IOHEXOL 300 MG/ML  SOLN  COMPARISON:  10/02/2011  FINDINGS: Find sagittal images of the spine shows diffuse osteopenia. Mild degenerative changes. Stable Schmorl's node deformity superior endplate of L3 and L4 vertebral body. Atherosclerotic calcifications of abdominal aorta and iliac arteries are noted. SMA calcifications are noted.  Small hiatal hernia is noted. Mild distended gallbladder without evidence of calcified gallstones. Liver, spleen, pancreas and adrenals are unremarkable. The pancreas is atrophic. Kidneys are symmetrical in size and enhancement. No hydronephrosis or hydroureter. Delayed renal images shows bilateral renal symmetrical excretion. Bilateral visualized proximal ureter is unremarkable.  There is distension of the right colon with fluid. Distension of transverse colon with gas and fluid.  There are distended small bowel loops with air-fluid levels distal small bowel loops highly suspicious for significant ileus or partial small bowel obstruction.  In axial image 32 there is apple  core thickening of colonic wall in splenic flexure of the colon. This is highly suspicious for colonic malignancy best visualized on coronal image 34.  Further correlation with colonoscopy is recommended. Nonspecific mild thickening of urinary bladder wall. The urinary bladder has a lobulated contour. The uterus and adnexa are not identified. Atherosclerotic calcifications of iliac arteries. No distal colonic obstruction. The sigmoid colon is empty partially collapsed.  IMPRESSION: 1. There is distension of the right colon and transverse colon with air-fluid level. Distended distal small bowel loops with air-fluid level suspicious for ileus or partial small bowel obstruction. There is a apple core thickening of the colonic wall in the splenic flexure   of the colon axial image 31 and 32 highly suspicious for colonic malignancy. Further correlation with colonoscopy is recommended. 2. No hydronephrosis or hydroureter. These results were called by telephone at the time of interpretation on 09/02/2013 at 4:22 PM to Dr. ANKIT NANAVATI , who verbally acknowledged these results.   Electronically Signed   By: Liviu  Pop M.D.   On: 09/02/2013 16:23   Dg Abd Acute W/chest  09/02/2013   CLINICAL DATA:  Abdominal pain, vomiting and weakness.  EXAM: ACUTE ABDOMEN SERIES (ABDOMEN 2 VIEW & CHEST 1 VIEW)  COMPARISON:  Chest x-ray 10/01/2011.  FINDINGS: The upright chest x-ray demonstrate stable tortuosity, ectasia and calcification of the thoracic aorta. The heart is normal in size. No acute pulmonary findings.  Two views of the abdomen demonstrated dilated small bowel loops and colon with scattered air-fluid levels. Findings could be due to gastroenteritis or ileus. No free air. The bony structures are intact. Advanced vascular calcifications are noted.  IMPRESSION: No acute cardiopulmonary findings.  Air-filled small bowel loops and colon with mild distention possibly due to gastroenteritis or ileus. No free air.   Electronically  Signed   By: Mark  Gallerani M.D.   On: 09/02/2013 14:10    Impression/Plan: 79 yo with CT evidence of an apple-core thickening in the splenic flexure with proximal large and small bowel loops concerning for a colon cancer source. Needs enemas today and colonoscopy tomorrow to further evaluate. Patient agreeable to the plan. Keep NPO. NG in place. No oral lavage due to partial obstruction.    LOS: 1 day   Deric Bocock C.  09/03/2013, 12:07 PM   

## 2013-09-04 NOTE — Evaluation (Signed)
Physical Therapy Evaluation Patient Details Name: [name] MRN: [id number] DOB: May 22, 1930 Today's Date: 09/04/2013 Time: 1020-1050 PT Time Calculation (min): 30 min  PT Assessment / Plan / Recommendation History of Present Illness  Pt is an 79 y/o female admitted with a SBO plus ileus, and was found to have an "apple-core lesion" on a CT scan. Pt is to go for a colonoscopy today.   Clinical Impression  This patient presents with acute pain and decreased functional independence following the above mentioned procedure. At the time of PT eval, pt with NG tube and almost continuous diarrhea due to colonoscopy this afternoon. Because of this, pt was not OOB and ambulation/transfers were not assessed. Per nursing, pt required heavy assist to transfer to the [facility]. This patient is appropriate for skilled PT interventions in the acute care setting to address functional limitations, improve safety and independence with functional mobility, and return to PLOF.     PT Assessment  Patient needs continued PT services    Follow Up Recommendations  SNF    Does the patient have the potential to tolerate intense rehabilitation      Barriers to Discharge        Equipment Recommendations  Other (comment) (TBD by next venue of care)    Recommendations for Other Services     Frequency Min 3X/week    Precautions / Restrictions Precautions Precautions: Fall Restrictions Weight Bearing Restrictions: No   Pertinent Vitals/Pain Pt reports moderate pain in the right side of her abdomen at beginning of session, however does not mention it again and does not appear to be limited by pain.       Mobility  Bed Mobility Bed Mobility: Rolling Right;Rolling Left Rolling Right: 4: Min assist;With rail Rolling Left: 4: Min assist;With rail Details for Bed Mobility Assistance: Pt able to perform bed mobility with min assist to come fully to the side, as NT was performing hygeine on pt.   Transfers Transfers: Not assessed Ambulation/Gait Ambulation/Gait Assistance: Not tested (comment)    Exercises General Exercises - Lower Extremity Ankle Circles/Pumps: 15 reps Quad Sets: 15 reps Gluteal Sets: 15 reps Short Arc Quad: 15 reps Heel Slides: 15 reps Hip ABduction/ADduction: 15 reps   PT Diagnosis: Difficulty walking;Generalized weakness  PT Problem List: Decreased strength;Decreased range of motion;Decreased activity tolerance;Decreased balance;Decreased mobility;Decreased knowledge of use of DME;Decreased safety awareness;Pain PT Treatment Interventions: DME instruction;Gait training;Stair training;Functional mobility training;Therapeutic activities;Therapeutic exercise;Neuromuscular re-education;Patient/family education     PT Goals(Current goals can be found in the care plan section) Acute Rehab PT Goals Patient Stated Goal: To return to PLOF PT Goal Formulation: With patient Time For Goal Achievement: 09/11/13 Potential to Achieve Goals: Good  Visit Information  Last PT Received On: 09/04/13 Assistance Needed: +1 (+2 for OOB) History of Present Illness: Pt is an 79 y/o female admitted with a SBO plus ileus, and was found to have an "apple-core lesion" on a CT scan. Pt is to go for a colonoscopy today.        Prior Functioning  Home Living Family/patient expects to be discharged to:: Skilled nursing facility Living Arrangements: Children Prior Function Level of Independence: Independent Communication Communication: No difficulties Dominant Hand: Right    Cognition  Cognition Arousal/Alertness: Awake/alert Behavior During Therapy: WFL for tasks assessed/performed Overall Cognitive Status: Within Functional Limits for tasks assessed    Extremity/Trunk Assessment Upper Extremity Assessment Upper Extremity Assessment: Generalized weakness Lower Extremity Assessment Lower Extremity Assessment: Generalized weakness Cervical / Trunk Assessment Cervical  /  Trunk Assessment: Kyphotic   Balance    End of Session PT - End of Session Equipment Utilized During Treatment: Other (comment) (NG tube) Activity Tolerance: Patient limited by fatigue Patient left: in bed;with call bell/phone within reach Nurse Communication: Mobility status  GP     Ruthann Cancer 09/04/2013, 11:42 AM  Ruthann Cancer, PT, DPT [phone]

## 2013-09-04 NOTE — Progress Notes (Signed)
Subjective: Pt had been feeling well, but developed some nausea.  Her NG tube was clogged so I flushed it at bedside with some relief.  NPO awaiting CSP at 1pm.  Mobilizing OOB.  Having BM's with enema's.    Objective: Vital signs in last 24 hours: Temp:  [98.1 F (36.7 C)-98.7 F (37.1 C)] 98.1 F (36.7 C) (12/09 0517) Pulse Rate:  [80-90] 90 (12/09 0517) Resp:  [15-20] 15 (12/09 0517) BP: (146-179)/(46-60) 156/60 mmHg (12/09 0517) SpO2:  [97 %-98 %] 98 % (12/09 0517) Last BM Date: 09/03/13  Intake/Output from previous day: 12/08 0701 - 12/09 0700 In: 2250 [I.V.:1800; IV Piggyback:450] Out: 900 [Urine:500; Emesis/NG output:400] Intake/Output this shift:    PE: Gen:  Alert, NAD, pleasant Abd: Soft, mild distension, moderately tender in the RLQ and LLQ, +BS, no HSM, lower abdominal scar noted  Lab Results:   Recent Labs  09/02/13 1220 09/02/13 1242 09/03/13 0638  WBC 14.5*  --  10.3  HGB 13.0 15.0 12.0  HCT 39.4 44.0 36.7  PLT 471*  --  398   BMET  Recent Labs  09/02/13 1242 09/02/13 2010 09/03/13 0638  NA 136  --  134*  K 4.0  --  4.4  CL 104  --  102  CO2  --   --  22  GLUCOSE 160*  --  119*  BUN 26*  --  20  CREATININE 1.20* 1.01 1.03  CALCIUM  --   --  8.6   PT/INR  Recent Labs  09/02/13 1220  LABPROT 13.5  INR 1.05   CMP     Component Value Date/Time   NA 134* 09/03/2013 0638   K 4.4 09/03/2013 0638   CL 102 09/03/2013 0638   CO2 22 09/03/2013 0638   GLUCOSE 119* 09/03/2013 0638   BUN 20 09/03/2013 0638   CREATININE 1.03 09/03/2013 0638   CALCIUM 8.6 09/03/2013 0638   PROT 6.7 09/03/2013 0638   ALBUMIN 2.9* 09/03/2013 0638   AST 8 09/03/2013 0638   ALT 5 09/03/2013 0638   ALKPHOS 58 09/03/2013 0638   BILITOT 0.3 09/03/2013 0638   GFRNONAA 50* 09/03/2013 0638   GFRAA 57* 09/03/2013 0638   Lipase     Component Value Date/Time   LIPASE 19 09/02/2013 1220       Studies/Results: Ct Abdomen Pelvis W Contrast  09/02/2013   CLINICAL DATA:   Intermittent right lower quadrant pain  EXAM: CT ABDOMEN AND PELVIS WITH CONTRAST  TECHNIQUE: Multidetector CT imaging of the abdomen and pelvis was performed using the standard protocol following bolus administration of intravenous contrast.  CONTRAST:  80mL OMNIPAQUE IOHEXOL 300 MG/ML  SOLN  COMPARISON:  10/02/2011  FINDINGS: Find sagittal images of the spine shows diffuse osteopenia. Mild degenerative changes. Stable Schmorl's node deformity superior endplate of L3 and L4 vertebral body. Atherosclerotic calcifications of abdominal aorta and iliac arteries are noted. SMA calcifications are noted.  Small hiatal hernia is noted. Mild distended gallbladder without evidence of calcified gallstones. Liver, spleen, pancreas and adrenals are unremarkable. The pancreas is atrophic. Kidneys are symmetrical in size and enhancement. No hydronephrosis or hydroureter. Delayed renal images shows bilateral renal symmetrical excretion. Bilateral visualized proximal ureter is unremarkable.  There is distension of the right colon with fluid. Distension of transverse colon with gas and fluid.  There are distended small bowel loops with air-fluid levels distal small bowel loops highly suspicious for significant ileus or partial small bowel obstruction.  In axial image 32 there is  apple core thickening of colonic wall in splenic flexure of the colon. This is highly suspicious for colonic malignancy best visualized on coronal image 34.  Further correlation with colonoscopy is recommended. Nonspecific mild thickening of urinary bladder wall. The urinary bladder has a lobulated contour. The uterus and adnexa are not identified. Atherosclerotic calcifications of iliac arteries. No distal colonic obstruction. The sigmoid colon is empty partially collapsed.  IMPRESSION: 1. There is distension of the right colon and transverse colon with air-fluid level. Distended distal small bowel loops with air-fluid level suspicious for ileus or partial  small bowel obstruction. There is a apple core thickening of the colonic wall in the splenic flexure of the colon axial image 31 and 32 highly suspicious for colonic malignancy. Further correlation with colonoscopy is recommended. 2. No hydronephrosis or hydroureter. These results were called by telephone at the time of interpretation on 09/02/2013 at 4:22 PM to Dr. Derwood Kaplan , who verbally acknowledged these results.   Electronically Signed   By: Natasha Mead M.D.   On: 09/02/2013 16:23   Dg Abd Acute W/chest  09/02/2013   CLINICAL DATA:  Abdominal pain, vomiting and weakness.  EXAM: ACUTE ABDOMEN SERIES (ABDOMEN 2 VIEW & CHEST 1 VIEW)  COMPARISON:  Chest x-ray 10/01/2011.  FINDINGS: The upright chest x-ray demonstrate stable tortuosity, ectasia and calcification of the thoracic aorta. The heart is normal in size. No acute pulmonary findings.  Two views of the abdomen demonstrated dilated small bowel loops and colon with scattered air-fluid levels. Findings could be due to gastroenteritis or ileus. No free air. The bony structures are intact. Advanced vascular calcifications are noted.  IMPRESSION: No acute cardiopulmonary findings.  Air-filled small bowel loops and colon with mild distention possibly due to gastroenteritis or ileus. No free air.   Electronically Signed   By: Loralie Champagne M.D.   On: 09/02/2013 14:10    Anti-infectives: Anti-infectives   Start     Dose/Rate Route Frequency Ordered Stop   09/03/13 1400  cefTRIAXone (ROCEPHIN) 1 g in dextrose 5 % 50 mL IVPB     1 g 100 mL/hr over 30 Minutes Intravenous Every 24 hours 09/02/13 1916     09/02/13 1345  cefTRIAXone (ROCEPHIN) 1 g in dextrose 5 % 50 mL IVPB     1 g 100 mL/hr over 30 Minutes Intravenous  Once 09/02/13 1335 09/02/13 1643       Assessment/Plan Abdominal pain and distension Colonic obstruction with apple core lesion Nausea Chronic constipation  Chronic RLQ pain   Plan:  1. No acute surgical needs, continue  conservative management (IVF, pain control, antiemetics)  2. CSP planned for today getting enema prep, OR later this week? 3. Ambulate and IS  4. SCD's and heparin  5. NG output 427mL/24hours 6. Will follow up with results of CSP    LOS: 2 days    DORT, Donevan Biller 09/04/2013, 10:01 AM Pager: [phone]

## 2013-09-04 NOTE — Progress Notes (Signed)
General Surgery [facility]  Discussed with Dr. Bosie Clos.  Very narrow orifice and not a candidate for stenting.  Will require resection and colostomy for obstructing colon neoplasm.  Tentatively schedule for Thursday, 09/06/2013, for resection pending cardiac clearance and optimization.  Velora Heckler, MD, [facility] Office: [phone]

## 2013-09-04 NOTE — Clinical Documentation Improvement (Signed)
THIS DOCUMENT IS NOT A PERMANENT PART OF THE MEDICAL RECORD  Please update your documentation with the medical record to reflect your response to this query. If you need help knowing how to do this please call [phone].  09/04/13   Dear Dr. Francee Nodal,  In a better effort to capture your patient's severity of illness, reflect appropriate length of stay and utilization of resources, a review of the patient medical record has revealed the following indicators the diagnosis of Heart Failure.    Based on your clinical judgment, please clarify and document ACUITY and TYPE in a progress note and/or discharge summary the clinical condition associated with the following supporting information:  In responding to this query please exercise your independent judgment.  The fact that a query is asked, does not imply that any particular answer is desired or expected.  Possible Clinical Conditions?  Systolic & Diastolic Congestive Heart Failure Chronic Systolic Congestive Heart Failure Chronic Diastolic Congestive Heart Failure Chronic Systolic & Diastolic Congestive Heart Failure Acute Systolic Congestive Heart Failure Acute Diastolic Congestive Heart Failure Acute Systolic & Diastolic Congestive Heart Failure Acute on Chronic Systolic Congestive Heart Failure Acute on Chronic Diastolic Congestive Heart Failure Acute on Chronic Systolic & Diastolic  Congestive Heart Failure Other Condition Cannot Clinically Determine  Supporting Information: (As per hx) "Pt has CHF"  Reviewed: additional documentation in the medical record BY KATHRYN STERN PA-C  Thank You,  Nevin Bloodgood RN, RN, CCDS  Clinical Documentation Specialist: [phone] Cell=[phone] Health Information Management [place]

## 2013-09-04 NOTE — Progress Notes (Signed)
I discussed with  Dr Karamalegos.  I agree with their plans documented in their progress note for today.  

## 2013-09-04 NOTE — Brief Op Note (Addendum)
Obstructing descending colon mass. See endopro note for details. D/W Dr. Derrell Lolling from [facility].

## 2013-09-04 NOTE — Progress Notes (Signed)
Patient found to have an obstructing descending colon mass on colonoscopy.  We will tentatively plan for surgery on Thursday.  She would benefit from max medical therapy of her CHF etc as well as a cardiology consult for clearance.   Tyrone Pautsch E 09/04/2013 3:01 PM

## 2013-09-05 ENCOUNTER — Encounter (HOSPITAL_COMMUNITY): Payer: Self-pay | Admitting: Gastroenterology

## 2013-09-05 DIAGNOSIS — I509 Heart failure, unspecified: Secondary | ICD-10-CM

## 2013-09-05 DIAGNOSIS — Z01810 Encounter for preprocedural cardiovascular examination: Secondary | ICD-10-CM

## 2013-09-05 DIAGNOSIS — I4891 Unspecified atrial fibrillation: Secondary | ICD-10-CM

## 2013-09-05 LAB — LIPID PANEL
Cholesterol: 162 mg/dL (ref 0–200)
LDL Cholesterol: 87 mg/dL (ref 0–99)
Total CHOL/HDL Ratio: 3.4 RATIO
Triglycerides: 133 mg/dL (ref <150)
VLDL: 27 mg/dL (ref 0–40)

## 2013-09-05 LAB — GLUCOSE, CAPILLARY
Glucose-Capillary: 91 mg/dL (ref 70–99)
Glucose-Capillary: 136 mg/dL — ABNORMAL HIGH (ref 70–99)

## 2013-09-05 MED ORDER — CHLORHEXIDINE GLUCONATE 4 % EX LIQD
1.0000 "application " | Freq: Once | CUTANEOUS | Status: AC
  Administered 2013-09-06: 1 via TOPICAL
  Filled 2013-09-05: qty 15

## 2013-09-05 MED ORDER — METOPROLOL TARTRATE 1 MG/ML IV SOLN
2.5000 mg | Freq: Four times a day (QID) | INTRAVENOUS | Status: AC
  Administered 2013-09-05 – 2013-09-10 (×19): 2.5 mg via INTRAVENOUS
  Filled 2013-09-05 (×26): qty 5

## 2013-09-05 MED ORDER — SODIUM CHLORIDE 0.9 % IV SOLN
1.0000 g | INTRAVENOUS | Status: AC
  Administered 2013-09-06: 1 g via INTRAVENOUS
  Filled 2013-09-05: qty 1

## 2013-09-05 MED ORDER — PROMETHAZINE HCL 25 MG/ML IJ SOLN
12.5000 mg | Freq: Four times a day (QID) | INTRAMUSCULAR | Status: DC | PRN
  Administered 2013-09-05: 12.5 mg via INTRAVENOUS
  Administered 2013-09-05 (×2): 6.25 mg via INTRAVENOUS
  Administered 2013-09-07 – 2013-09-14 (×2): 12.5 mg via INTRAVENOUS
  Filled 2013-09-05 (×5): qty 1

## 2013-09-05 MED ORDER — DEXTROSE-NACL 5-0.9 % IV SOLN
INTRAVENOUS | Status: DC
  Administered 2013-09-05 – 2013-09-07 (×4): via INTRAVENOUS

## 2013-09-05 NOTE — Clinical Social Work Psychosocial (Signed)
Clinical Social Work Department BRIEF PSYCHOSOCIAL ASSESSMENT 09/05/2013  Patient:  [name], [name]     Account Number:  [id number]     Admit date:  09/02/2013  Clinical Social Worker:  Sherre Lain  Date/Time:  09/05/2013 04:20 PM  Referred by:  Physician  Date Referred:  09/05/2013 Referred for  SNF Placement   Other Referral:   none.   Interview type:  Patient Other interview type:   none.    PSYCHOSOCIAL DATA Living Status:  FAMILY Admitted from facility:   Level of care:   Primary support name:  [name] Primary support relationship to patient:  CHILD, ADULT Degree of support available:   Strong support system.    CURRENT CONCERNS Current Concerns  Post-Acute Placement   Other Concerns:   none.    SOCIAL WORK ASSESSMENT / PLAN CSW met with pt at bedside. Pt stated that she was agreeable to SNF placement if still needed after surgery on 09/06/2013. Pt stated that she is from home with daughter and would like to return there once finished with SNF. CSW to continue to follow and assist with discharge planning needs.   Assessment/plan status:  Psychosocial Support/Ongoing Assessment of Needs Other assessment/ plan:   none.   Information/referral to community resources:   none.    PATIENTS/FAMILYS RESPONSE TO PLAN OF CARE: Pt was understanding and agreeable to CSW plan of care.       Darlyn Chamber, LCSWA Clinical Social Worker [phone]

## 2013-09-05 NOTE — Consult Note (Signed)
CARDIOLOGY CONSULT NOTE   Patient ID: [name] MRN: [id number] DOB/AGE: [birth year] 79 y.o.  Admit date: 09/02/2013  Primary Physician   Kaleen Mask, MD Primary Cardiologist   New Reason for Consultation   Pre op clearance   HPI: [name] is a 79 y.o. female with a history of DM type 2, left sided CVA, paroxysmal atrial fibrillation, tobacco abuse, diastolic heart failure (EF 60-65% ECHO 2011, 40-45% 2012 ECHO, tech limited by Afib) and HTN who presented to [facility] on 09/02/13 with abdominal pain and n/v. She was found to have a colonic obstruction concerning for a malignant colonic mass and needs cardiology pre op clearance.   On admission she was found to be in what was thought was to be afib with HR 94. Patient denies known history of atrial fibrillation and not on anticoagulation (CHADSVASC score 9).   Past Medical History  Diagnosis Date  . COPD (chronic obstructive pulmonary disease)   . CHF (congestive heart failure)     diastolic  . Hypertension   . Stroke 2008    rt side deficit  . A-fib   . Diabetes mellitus   . Back pain   . Constipation   . SBO (small bowel obstruction)     per GI note in 2011, pt denies  . Upper GI bleed 2011    required hospitalization  . Colon polyps      Past Surgical History  Procedure Laterality Date  . Total abdominal hysterectomy  1980    Allergies  Allergen Reactions  . Aggrenox [Aspirin-Dipyridamole Er] Diarrhea and Nausea And Vomiting    I have reviewed the patient's current medications . cefTRIAXone (ROCEPHIN)  IV  1 g Intravenous Q24H  . insulin aspart  0-9 Units Subcutaneous Q4H   . sodium chloride 100 mL/hr at 09/04/13 1850   morphine injection, ondansetron (ZOFRAN) IV, promethazine  Prior to Admission medications   Medication Sig Start Date End Date Taking? Authorizing Provider  Docusate Calcium (STOOL SOFTENER PO) Take 2 capsules by mouth daily as needed. For constipation    Yes Historical Provider, MD  morphine (MS CONTIN) 30 MG 12 hr tablet Take 30 mg by mouth 2 (two) times daily.     Yes Historical Provider, MD  nitroGLYCERIN (NITROSTAT) 0.4 MG SL tablet Place 0.4 mg under the tongue every 5 (five) minutes as needed for chest pain.   Yes Historical Provider, MD  nortriptyline (PAMELOR) 25 MG capsule Take 100 mg by mouth at bedtime.   Yes Historical Provider, MD  traMADol (ULTRAM) 50 MG tablet Take 100 mg by mouth 2 (two) times daily.   Yes Historical Provider, MD  traZODone (DESYREL) 150 MG tablet Take 150 mg by mouth at bedtime.     Yes Historical Provider, MD  sulfamethoxazole-trimethoprim (BACTRIM DS,SEPTRA DS) 800-160 MG per tablet Take 1 tablet by mouth 2 (two) times daily. For 14 days 08/23/13   Historical Provider, MD     History   Social History  . Marital Status: Widowed    Spouse Name: N/A    Number of Children: N/A  . Years of Education: N/A   Occupational History  . Not on file.   Social History Main Topics  . Smoking status: Current Every Day Smoker -- 0.50 packs/day for 15 years    Types: Cigarettes  . Smokeless tobacco: Not on file  . Alcohol Use: Yes  . Drug Use: No  . Sexual Activity: No  Other Topics Concern  . Not on file   Social History Narrative  . No narrative on file    No family status information on file.   History reviewed. No pertinent family history.   ROS:  Full 14 point review of systems complete and found to be negative unless listed above.  Physical Exam: Blood pressure 157/95, pulse 95, temperature 97.6 F (36.4 C), temperature source Oral, resp. rate 16, height 5' (1.524 m), weight 165 lb 12.6 oz (75.2 kg), SpO2 99.00%.  General: elderly 68 yr F, laying in bed, chronically ill appearing, NAD  HEENT: normal Cardiovascular: irreg irreg, distant heart sounds, no murmurs heard on exam  Respiratory: CTAB, no rhonchi, wheezing, crackles. Normal work of breathing  Abdomen: soft, non-distended, +tenderness  mostly R>L (localized to RLQ), no significant guarding or rebound, +hypoactive BS in all quads  Extremities: contracture of right hand and foot (at baseline), no edema or tenderness  Skin: no lesions or rashes  Neuro: awake, alert, and oriented to person and location but not time   Labs:   Lab Results  Component Value Date   WBC 10.3 09/03/2013   HGB 12.0 09/03/2013   HCT 36.7 09/03/2013   MCV 84.4 09/03/2013   PLT 398 09/03/2013    Recent Labs  09/02/13 1220  INR 1.05    Recent Labs Lab 09/03/13 0638  NA 134*  K 4.4  CL 102  CO2 22  BUN 20  CREATININE 1.03  CALCIUM 8.6  PROT 6.7  BILITOT 0.3  ALKPHOS 58  ALT 5  AST 8  GLUCOSE 119*  ALBUMIN 2.9*     Recent Labs  09/02/13 1241  TROPIPOC 0.02   Pro B Natriuretic peptide (BNP)  Date/Time Value Range Status  09/03/2013  6:38 AM 1298.0* 0 - 450 pg/mL Final  11/07/2010 10:32 AM 146.0* 0.0 - 100.0 pg/mL Final     Lipase  Date/Time Value Range Status  09/02/2013 12:20 PM 19  11 - 59 U/L Final    Echo: Transthoracic Echocardiography Study Date: Study Date: 11/03/2010 ------------------------------------ Indications:   Shortness of breath 786.05. -------------------------------------------------------------------- History:  PMH: Stroke. Chronic obstructive pulmonary disease. Risk factors: Hypertension. Diabetes mellitus. -------------------------------------------------------------------- Study Conclusions Left ventricle: The cavity size was normal. There was mild concentric hypertrophy. Systolic function was mildly to moderately reduced. The estimated ejection fraction was in the range of 40% to 45%. Doppler parameters are consistent with abnormal left ventricular relaxation (grade 1 diastolic dysfunction). Impressions: - Techinically limited study An atrial fibrillation rhythm was noted   on this study, making this study technically difficult for the   assessment of cardiac  function. -------------------------------------------------------------------- Left ventricle: The cavity size was normal. There was mild concentric hypertrophy. Systolic function was mildly to moderately reduced. The estimated ejection fraction was in the range of 40% to 45%. Images were inadequate for LV wall motion assessment. Doppler parameters are consistent with abnormal left ventricular relaxation (grade 1 diastolic dysfunction). -------------------------------------------------------------------- Aortic valve: Structurally normal valve. Cusp separation was normal. Doppler: Transvalvular velocity was within the normal range. There was no stenosis. No regurgitation. -------------------------------------------------------------------- Aorta: Aortic root: The aortic root was normal in size. Ascending aorta: The ascending aorta was normal in size. -------------------------------------------------------------------- Mitral valve: Poorly visualized. ----------------------------------------------------------------- Left atrium: The atrium was normal in size. -------------------------------------------------------------------- Atrial septum: Poorly visualized. -------------------------------------------------------------------- Right ventricle: The cavity size was normal. -------------------------------------------------------------------- Pulmonic valve: Poorly visualized. -------------------------------------------------------------------- Tricuspid valve: Poorly visualized. ------------------------------------------------------------------- Pulmonary artery: Poorly visualized. -------------------------------------------------------------------- Right atrium: Poorly visualized. -------------------------------------------------------------------- Pericardium: Not well  visualized.   ECG: 09/03/13 Afib with RBBB HR 94  Radiology:  09/02/13  ACUTE ABDOMEN SERIES (ABDOMEN 2 VIEW &  CHEST 1 VIEW) COMPARISON: Chest x-ray 10/01/2011. FINDINGS: The upright chest x-ray demonstrate stable tortuosity, ectasia and calcification of the thoracic aorta. The heart is normal in size. No acute pulmonary findings. Two views of the abdomen demonstrated dilated small bowel loops and colon with scattered air-fluid levels. Findings could be due to gastroenteritis or ileus. No free air. The bony structures are intact. Advanced vascular calcifications are noted. IMPRESSION: No acute cardiopulmonary findings. Air-filled small bowel loops and colon with mild distention possibly due to gastroenteritis or ileus. No free air.  ASSESSMENT AND PLAN:    Active Problems:   Ileus   Small bowel obstruction   SBO (small bowel obstruction)   Abdominal pain, generalized  Paroxysmal Afib -  -- Patient denies known history and not on anticoagulation -- EKG showed A-fib on admission (possibly) and on multiple past EKG's in 2013  -- CHADSVASC score 9, but hx of GI bleed in 2011 so anticoagulation was held in ED -- Currently in sinus rhythm, HR 89 -- Continue to monitor on telemetry   Chronic congestive heart failure- possibly both systolic and diastolic -- EF 60-65% ECHO 2011, 40-45% ECHO 2012 , tech limited by Afib -- Will need repeat ECHO to assess LV function in the setting of chronic dysrhythmia  -- If ECHO reveals reduced EF will add BB and ACE at a later date   HLD - Previously taking simvastatin 40 mg, but no on any medications  -- Will order lipid panel   Tobacco Abuse - current daily smoke, pack years ~70 years  -- Cessation counseling   DM  -- HgA1c 7.2, hyperglycemia on this admission  -- Patient denies hx of DM -- SSI per internal medicine   Signed: Thereasa Parkin, PA-C 09/05/2013 8:52 AM   Co-Sign MD  As above, patient seen and examined. Briefly she is an 79 year old female with a past medical history of diabetes mellitus, prior CVA, atrial fibrillation, diastolic  congestive heart failure and now with colon cancer/ileus who we are asked to evaluate preoperatively. The patient was active prior to admission. She has dyspnea with more extreme activities but not routine activities. No orthopnea or PND occasional mild pedal edema. She does not have exertional chest pain. Occasional palpitations. She has been admitted with an ileus. Workup has demonstrated colon cancer which will require resection. Electrocardiogram on admission was significant artifact. Question atrial fibrillation, right bundle branch block. Review of telemetry shows sinus rhythm with PACs. Prior electrocardiograms from 2013 demonstrated atrial fibrillation. Given age and multiple risk factors patient's risk for surgery will be elevated. However she was not having symptoms of dyspnea or chest pain prior to admission. Her surgery cannot be delayed as she has an ileus as well as a colon cancer. Cardiac workup is not feasible as if she required revascularization this would require dual antiplatelet therapy which we could not add at this point given ongoing GI issues. If LV function is normal on echocardiogram would proceed with surgery as scheduled. She understands there is somewhat of a higher risk. Once she recovers from her surgery she needs to be on long-term anticoagulation given documentation of paroxysmal atrial fibrillation. I will add low-dose beta-blockade. Careful with hydration given history of diastolic congestive heart failure. [name]

## 2013-09-05 NOTE — Progress Notes (Signed)
Family Medicine Teaching Service Daily Progress Note Intern Pager: [phone]  Patient name: [name] Medical record number: [id number] Date of birth: 20-Mar-1930 Age: 79 y.o. Gender: female  Primary Care Provider: Kaleen Mask, MD Consultants: General Surgery, GI Code Status: Full  Pt Overview and Major Events to Date:  12/7 - CT Abd showing distended R-colon, ileus / partial SBO, "apple-core lesion" suspicious for malignancy 12/8 - c/s GI --> plan for colonoscopy 12/9 12/9 - scheduled colonoscopy, Gen surg cont to follow (awaiting results - aware of potential need for surgery) 12/10 - Colonoscopy (obstructing descending colon mass), tentative surgery 12/11 for resection pending cardiac clearance / medical stability  Assessment and Plan:  [name] is a 79 y.o. female presenting with abdominal pain, nausea and vomiting consistent with ileus and with possible bowel obstruction. PMH is significant for DM type 2, left sided CVA, atrial fibrillation, Chronic HF w/ preserved EF, and HTN.  # Abdominal Pain/Distention, secondary to Obstructing Colon Adenocarcinoma Ileus/SBO of small bowel as well as ileus of colon with thickening of colon at splenic flexure concerning for mass noted on CT scan, also possibly related to adhesions from abdominal hysterectomy in 1980s; Of note GI consult note in 2011 states that pt has a history of SBO, but pt and daughter deny this at present; No acute abdomen on admission. Currently abdominal pain improved today (2/10) mostly RLQ, NGT in place (d/t vomiting) with moderate amount fluid suctioned. Try to minimize opiate use with ileus - c/s General Surgery - greatly appreciate all recs     - Tentative plans for surgical resection of colon mass on 09/06/13 pending medical / cardiac clearance - c/s GI [name]) - greatly appreciate all recs      - 12/9 colonoscopy --> obstructing descending colon mass - identified as adenocarcinoma per biopsy  #  Pre-operative Clearance Multiple surgical co-morbidities, AFib, T2DM, CHF, h/o CVA - c/s Cardiology for pre-op clearance    - f/u detailed recommendations, BB, lipid panel, ECHO determine LV fxn, cautious fluid hydration         - if ECHO appropriate EF --> proceed with surgery   # Chronic Constipation - Pt recently went 2 weeks without BM, likely due to daily opiate use and being completely sedentary. Reportedly had multiple BM o/n. Previous enemas for colonoscopy prep successful. - plan to start regular bowel regimen when tolerating PO  - Given suppository as long as no SBO  # Urinary Tract Infection, uncomplicated U/A showing + leukocytes and bacteria; previous UTI in July 214 was E. Coli sensitive to CTX. Currently stable, without dysuria, afebrile, VSS. Noted improved odor with urine, expect good response to CTX. - S/p 1 gm CTX in ED (12/8) - continue CTX (Day 3), remains NPO - continue CTX to complete abx course as pt will remain in hospital - Urine culture (E. Coli >100,000CFU, sensitive to CTX, Nitro, tobramycin, Resistant to Cefazolin)  # Atrial Fibrillation - Patient denies known history and not on anticoagulation (CHADSVASC score 9); EKG showed A-fib on admission and present on multiple EKG's in 2013  - Hemodynamically stable and history of GI bleeding in 2011, so no anticoagulation at this time (HAS Bled Score 5)  - Continue to monitor on telemetry - currently HR stable 74-95, consider starting rate control - low dose BB, consider resuming regular dose post-op  # Chronic HF, with preserved EF - Grade 1 diastolic dysfunction per Echo 1308; Pt denies knowledge of this problem but numerous discharge summaries mention  this and pt was taking toprol and lisinopril in past; No current evidence of volume overload  - ECHO today, eval EF - Consider starting beta blocker and ACE-I as tolerated postop  # COPD - Hx of taking Spiriva and Albuterol, but currently taking no meds; No evience of  COPD on exam with good air movement and no wheezing  - Use albuterol inhaler PRN  # Tobacco Abuse - current daily smoke, pack years > 50  - Nicotine patch PRN  # Diabetes Mellitus - Pt claims her diabetes "went away" and has not been on medication for years  - Hgb A1c 7.2 (09/03/13) - Q4 hr CBGs --> 90-117, resume Q4 hr AC once diet resumed - SSI sesnitive (has not received any)  # Cerebrovascular Disease - s/p left sided CVA 2008; History of taking Aggrenox but stopped in 2011 after upper GI bleed from multiple bleeding ulcers and started Plavix, but not currently taking any meds for this problem  - Given history of stroke, pt likely needs to restart Plavix  # HLD - Previously taking simvastatin 40 mg, but no on any medications - Pt needs to be on Lipitor 80 mg given history of stroke  - Start when ileus resolved  # Memory Deficits / Functional Decline - Pt admitted memory loss, not oriented to time; likely component of vascular dementia  - Consider MMSE vs MiniCog during admission  - Ask questions of daughter [name] [phone] - PT/OT - PT saw on 12/8, pt too weak to stand, continue to follow  FEN/GI: NPO with NGT, D5-NS for MIVF  Prophylaxis: Heparin SQ TID  Disposition: Admit to [facility] Medicine inpatient service, pending further diagnostic work-up and treatment, pending improved clinical course, expect patient to be discharged to home in 2-3 days  Subjective: No acute events overnight. Reported that she has tolerated colonoscopy yesterday. Denies any further significant nausea episodes. Improved abdominal pain. Good spirits this morning. Plan to revisit future goals later with family.  Objective: Temp:  [97.6 F (36.4 C)-98.4 F (36.9 C)] 97.6 F (36.4 C) (12/10 0981) Pulse Rate:  [74-95] 95 (12/10 0614) Resp:  [11-20] 16 (12/10 0614) BP: (148-213)/(34-95) 157/95 mmHg (12/10 0614) SpO2:  [97 %-99 %] 99 % (12/10 1914) Physical Exam: General: elderly 83 yr F,  comfortably laying in bed, NGT in place, NAD HEENT: PERRL, EOMI, MMM Cardiovascular: irreg irreg, distant heart sounds, no murmurs heard on exam Respiratory: CTAB, no rhonchi, wheezing, crackles. Normal work of breathing Abdomen: soft, non-distended, +mild tenderness mostly R>L (improved), no significant guarding or rebound, +hypoactive BS in all quads Extremities: contracture of right hand and foot (at baseline), no edema or tenderness  Skin: no lesions or rashes  Neuro: awake, alert, and oriented to person and location but not time; permanent deficits of right upper and lower extremities  Laboratory:  Recent Labs Lab 09/02/13 1220 09/02/13 1242 09/03/13 0638  WBC 14.5*  --  10.3  HGB 13.0 15.0 12.0  HCT 39.4 44.0 36.7  PLT 471*  --  398    Recent Labs Lab 09/02/13 1220 09/02/13 1242 09/02/13 2010 09/03/13 0638  NA  --  136  --  134*  K  --  4.0  --  4.4  CL  --  104  --  102  CO2  --   --   --  22  BUN  --  26*  --  20  CREATININE  --  1.20* 1.01 1.03  CALCIUM  --   --   --  8.6  PROT 7.6  --   --  6.7  BILITOT 0.3  --   --  0.3  ALKPHOS 67  --   --  58  ALT <5  --   --  5  AST 9  --   --  8  GLUCOSE  --  160*  --  119*    HgbA1c 7.2  ProBNP 1298.0 (prior baseline 200s)  PT 13.5 / INR 1.05, APTT 28  12/7 UA Large leuks, neg nitrite, mod hgb, WBC TNTC, rare squam, many bacteria - suggestive of UTI  12/7 Urine Culture (pending)  Imaging/Diagnostic Tests:  12/7 Acute Abd / Chest Xray IMPRESSION:  No acute cardiopulmonary findings.  Air-filled small bowel loops and colon with mild distention possibly  due to gastroenteritis or ileus. No free air  12/7 CT Abd / Pelvis IMPRESSION:  1. There is distension of the right colon and transverse colon with  air-fluid level. Distended distal small bowel loops with air-fluid  level suspicious for ileus or partial small bowel obstruction. There  is a apple core thickening of the colonic wall in the splenic   flexure of the colon axial image 31 and 32 highly suspicious for  colonic malignancy. Further correlation with colonoscopy is  recommended.  2. No hydronephrosis or hydroureter.  12/9 Colonoscopy Obstructing descending colon mass. Biopsy positive for Adenocarcinoma.  Saralyn Pilar, DO 09/05/2013, 6:54 AM PGY-1, [facility] FPTS Intern pager: [phone], text pages welcome

## 2013-09-05 NOTE — Progress Notes (Addendum)
Patient ID: [name], female   DOB: [birth year], 79 y.o.   MRN: [id number]  Adenocarcinoma confirmed on colon biopsies. Surgery planned this week. D/W patient. Will sign off. Call if questions.

## 2013-09-05 NOTE — Progress Notes (Signed)
Patient ID: [name], female   DOB: October 09, 1929, 79 y.o.   MRN: [id number]  General Surgery - [facility] - Progress Note  Subjective: Patient in bed, no complaints.  NG tube in place.  Objective: Vital signs in last 24 hours: Temp:  [97.6 F (36.4 C)-98.4 F (36.9 C)] 97.6 F (36.4 C) (12/10 0614) Pulse Rate:  [74-95] 95 (12/10 0614) Resp:  [11-20] 16 (12/10 0614) BP: (148-213)/(34-95) 157/95 mmHg (12/10 0614) SpO2:  [97 %-99 %] 99 % (12/10 0614) Last BM Date: 09/04/13  Intake/Output from previous day: 12/09 0701 - 12/10 0700 In: 2433.3 [I.V.:2383.3; IV Piggyback:50] Out: 950 [Emesis/NG output:950]  Exam: HEENT - clear, not icteric Neck - soft Chest - coarse bilaterally Cor - rate in 90's Abd - soft without significant distension; no tenderness  Lab Results:   Recent Labs  09/02/13 1220 09/02/13 1242 09/03/13 0638  WBC 14.5*  --  10.3  HGB 13.0 15.0 12.0  HCT 39.4 44.0 36.7  PLT 471*  --  398     Recent Labs  09/02/13 1242 09/02/13 2010 09/03/13 0638  NA 136  --  134*  K 4.0  --  4.4  CL 104  --  102  CO2  --   --  22  GLUCOSE 160*  --  119*  BUN 26*  --  20  CREATININE 1.20* 1.01 1.03  CALCIUM  --   --  8.6    Studies/Results: No results found.  Assessment / Plan: 1.  Obstructing mass in descending colon  Biopsies pending - suspect adenocarcinoma  Plan resection tomorrow 12/11 - will need colostomy due to obstruction  Discussed with patient - will meet with family later today if desired  The risks and benefits of the procedure have been discussed at length with the patient.  The patient understands the proposed procedure, potential alternative treatments, and the course of recovery to be expected.  All of the patient's questions have been answered at this time.  The patient wishes to proceed with surgery.  Velora Heckler, MD, [facility] Office: [phone]  09/05/2013

## 2013-09-05 NOTE — Progress Notes (Signed)
General Surgery [facility]  Met with patient's daughter and grand-daughter in patient's room.  Discussed planned surgery and need for colostomy.  They understand and wish to proceed.  The risks and benefits of the procedure have been discussed at length with the patient and family.  The patient and her family understand the proposed procedure, potential alternative treatments, and the course of recovery to be expected.  All of their questions have been answered at this time.  They wish to proceed with surgery.  Velora Heckler, MD, [facility] Office: [phone]

## 2013-09-05 NOTE — Progress Notes (Signed)
Patient complaining of nausea. Zofran 4 mg given. Will monitor. Sherlyn Lees, RN

## 2013-09-06 ENCOUNTER — Inpatient Hospital Stay (HOSPITAL_COMMUNITY): Payer: Medicare Other | Admitting: Anesthesiology

## 2013-09-06 ENCOUNTER — Encounter (HOSPITAL_COMMUNITY): Payer: Medicare Other | Admitting: Anesthesiology

## 2013-09-06 ENCOUNTER — Encounter (HOSPITAL_COMMUNITY)
Admission: EM | Disposition: A | Discharge status: Home / Self Care | Payer: Self-pay | Source: Home / Self Care | Attending: Family Medicine

## 2013-09-06 ENCOUNTER — Inpatient Hospital Stay (HOSPITAL_COMMUNITY): Payer: Medicare Other

## 2013-09-06 DIAGNOSIS — J9600 Acute respiratory failure, unspecified whether with hypoxia or hypercapnia: Secondary | ICD-10-CM

## 2013-09-06 DIAGNOSIS — C19 Malignant neoplasm of rectosigmoid junction: Secondary | ICD-10-CM

## 2013-09-06 DIAGNOSIS — I369 Nonrheumatic tricuspid valve disorder, unspecified: Secondary | ICD-10-CM

## 2013-09-06 HISTORY — PX: COLON RESECTION: SHX5231

## 2013-09-06 LAB — POCT I-STAT 3, ART BLOOD GAS (G3+)
Acid-Base Excess: 2 mmol/L (ref 0.0–2.0)
Bicarbonate: 25.8 mEq/L — ABNORMAL HIGH (ref 20.0–24.0)
O2 Saturation: 96 %
TCO2: 27 mmol/L (ref 0–100)
pCO2 arterial: 37.3 mmHg (ref 35.0–45.0)
pH, Arterial: 7.448 (ref 7.350–7.450)
pO2, Arterial: 76 mmHg — ABNORMAL LOW (ref 80.0–100.0)

## 2013-09-06 LAB — MRSA PCR SCREENING: MRSA by PCR: NEGATIVE

## 2013-09-06 LAB — GLUCOSE, CAPILLARY
Glucose-Capillary: 126 mg/dL — ABNORMAL HIGH (ref 70–99)
Glucose-Capillary: 141 mg/dL — ABNORMAL HIGH (ref 70–99)

## 2013-09-06 SURGERY — COLON RESECTION
Anesthesia: General

## 2013-09-06 MED ORDER — ONDANSETRON HCL 4 MG/2ML IJ SOLN
INTRAMUSCULAR | Status: AC
  Filled 2013-09-06: qty 2

## 2013-09-06 MED ORDER — ETOMIDATE 2 MG/ML IV SOLN
INTRAVENOUS | Status: DC | PRN
  Administered 2013-09-06: 20 mg via INTRAVENOUS

## 2013-09-06 MED ORDER — PHENYLEPHRINE HCL 10 MG/ML IJ SOLN
INTRAMUSCULAR | Status: DC | PRN
  Administered 2013-09-06 (×2): 80 ug via INTRAVENOUS

## 2013-09-06 MED ORDER — ONDANSETRON HCL 4 MG/2ML IJ SOLN
4.0000 mg | Freq: Four times a day (QID) | INTRAMUSCULAR | Status: AC | PRN
  Administered 2013-09-06: 4 mg via INTRAVENOUS

## 2013-09-06 MED ORDER — BIOTENE DRY MOUTH MT LIQD
15.0000 mL | Freq: Four times a day (QID) | OROMUCOSAL | Status: DC
  Administered 2013-09-06 – 2013-09-10 (×14): 15 mL via OROMUCOSAL

## 2013-09-06 MED ORDER — FENTANYL CITRATE 0.05 MG/ML IJ SOLN
INTRAMUSCULAR | Status: DC | PRN
  Administered 2013-09-06: 100 ug via INTRAVENOUS
  Administered 2013-09-06: 75 ug via INTRAVENOUS
  Administered 2013-09-06: 100 ug via INTRAVENOUS
  Administered 2013-09-06: 75 ug via INTRAVENOUS
  Administered 2013-09-06: 100 ug via INTRAVENOUS

## 2013-09-06 MED ORDER — FENTANYL CITRATE 0.05 MG/ML IJ SOLN
50.0000 ug | INTRAMUSCULAR | Status: DC | PRN
  Administered 2013-09-06 – 2013-09-07 (×4): 50 ug via INTRAVENOUS
  Filled 2013-09-06 (×5): qty 2

## 2013-09-06 MED ORDER — LACTATED RINGERS IV SOLN
INTRAVENOUS | Status: DC
  Administered 2013-09-06 (×2): via INTRAVENOUS

## 2013-09-06 MED ORDER — FENTANYL CITRATE 0.05 MG/ML IJ SOLN: 50.0000 ug | INTRAMUSCULAR | Status: DC | PRN

## 2013-09-06 MED ORDER — 0.9 % SODIUM CHLORIDE (POUR BTL) OPTIME
TOPICAL | Status: DC | PRN
  Administered 2013-09-06 (×2): 1000 mL

## 2013-09-06 MED ORDER — SUCCINYLCHOLINE CHLORIDE 20 MG/ML IJ SOLN
INTRAMUSCULAR | Status: DC | PRN
  Administered 2013-09-06: 100 mg via INTRAVENOUS

## 2013-09-06 MED ORDER — FENTANYL CITRATE 0.05 MG/ML IJ SOLN
INTRAMUSCULAR | Status: AC
  Filled 2013-09-06: qty 2

## 2013-09-06 MED ORDER — OXYCODONE HCL 5 MG PO TABS: 5.0000 mg | ORAL_TABLET | Freq: Once | ORAL | Status: DC | PRN

## 2013-09-06 MED ORDER — ALBUMIN HUMAN 5 % IV SOLN
INTRAVENOUS | Status: DC | PRN
  Administered 2013-09-06: 13:00:00 via INTRAVENOUS

## 2013-09-06 MED ORDER — ALBUTEROL SULFATE (5 MG/ML) 0.5% IN NEBU
2.5000 mg | INHALATION_SOLUTION | RESPIRATORY_TRACT | Status: DC | PRN
  Filled 2013-09-06: qty 0.5

## 2013-09-06 MED ORDER — OXYCODONE HCL 5 MG/5ML PO SOLN: 5.0000 mg | Freq: Once | ORAL | Status: DC | PRN

## 2013-09-06 MED ORDER — LIDOCAINE HCL (CARDIAC) 20 MG/ML IV SOLN
INTRAVENOUS | Status: DC | PRN
  Administered 2013-09-06: 70 mg via INTRAVENOUS

## 2013-09-06 MED ORDER — MIDAZOLAM HCL 5 MG/5ML IJ SOLN
INTRAMUSCULAR | Status: DC | PRN
  Administered 2013-09-06: 2 mg via INTRAVENOUS

## 2013-09-06 MED ORDER — FENTANYL CITRATE 0.05 MG/ML IJ SOLN
25.0000 ug | INTRAMUSCULAR | Status: DC | PRN
  Administered 2013-09-06: 25 ug via INTRAVENOUS

## 2013-09-06 MED ORDER — CHLORHEXIDINE GLUCONATE 0.12 % MT SOLN
15.0000 mL | Freq: Two times a day (BID) | OROMUCOSAL | Status: DC
  Administered 2013-09-06 – 2013-09-10 (×9): 15 mL via OROMUCOSAL
  Filled 2013-09-06 (×10): qty 15

## 2013-09-06 MED ORDER — ONDANSETRON HCL 4 MG/2ML IJ SOLN
INTRAMUSCULAR | Status: DC | PRN
  Administered 2013-09-06 (×2): 4 mg via INTRAVENOUS

## 2013-09-06 MED ORDER — NEOSTIGMINE METHYLSULFATE 1 MG/ML IJ SOLN
INTRAMUSCULAR | Status: DC | PRN
  Administered 2013-09-06: 4 mg via INTRAVENOUS

## 2013-09-06 MED ORDER — PANTOPRAZOLE SODIUM 40 MG IV SOLR
40.0000 mg | INTRAVENOUS | Status: DC
  Administered 2013-09-06 – 2013-09-10 (×5): 40 mg via INTRAVENOUS
  Filled 2013-09-06 (×7): qty 40

## 2013-09-06 MED ORDER — LABETALOL HCL 5 MG/ML IV SOLN
INTRAVENOUS | Status: DC | PRN
  Administered 2013-09-06 (×3): 5 mg via INTRAVENOUS

## 2013-09-06 MED ORDER — HYDROMORPHONE HCL PF 1 MG/ML IJ SOLN
INTRAMUSCULAR | Status: DC | PRN
  Administered 2013-09-06: 1 mg via INTRAVENOUS

## 2013-09-06 MED ORDER — ROCURONIUM BROMIDE 100 MG/10ML IV SOLN
INTRAVENOUS | Status: DC | PRN
  Administered 2013-09-06: 50 mg via INTRAVENOUS
  Administered 2013-09-06: 10 mg via INTRAVENOUS

## 2013-09-06 MED ORDER — GLYCOPYRROLATE 0.2 MG/ML IJ SOLN
INTRAMUSCULAR | Status: DC | PRN
  Administered 2013-09-06: 0.6 mg via INTRAVENOUS

## 2013-09-06 SURGICAL SUPPLY — 61 items
BLADE SURG 10 STRL SS (BLADE) ×1 IMPLANT
BLADE SURG ROTATE 9660 (MISCELLANEOUS) IMPLANT
CANISTER SUCTION 2500CC (MISCELLANEOUS) ×2 IMPLANT
CHLORAPREP W/TINT 26ML (MISCELLANEOUS) ×3 IMPLANT
COVER MAYO STAND STRL (DRAPES) ×3 IMPLANT
COVER SURGICAL LIGHT HANDLE (MISCELLANEOUS) ×3 IMPLANT
DRAPE LAPAROSCOPIC ABDOMINAL (DRAPES) ×2 IMPLANT
DRAPE PROXIMA HALF (DRAPES) ×3 IMPLANT
DRAPE SURG 17X23 STRL (DRAPES) ×1 IMPLANT
DRAPE UTILITY 15X26 W/TAPE STR (DRAPE) ×10 IMPLANT
DRAPE WARM FLUID 44X44 (DRAPE) ×2 IMPLANT
DRSG OPSITE POSTOP 4X10 (GAUZE/BANDAGES/DRESSINGS) ×1 IMPLANT
DRSG OPSITE POSTOP 4X8 (GAUZE/BANDAGES/DRESSINGS) IMPLANT
ELECT BLADE 6.5 EXT (BLADE) ×2 IMPLANT
ELECT CAUTERY BLADE 6.4 (BLADE) ×4 IMPLANT
ELECT REM PT RETURN 9FT ADLT (ELECTROSURGICAL) ×2
ELECTRODE REM PT RTRN 9FT ADLT (ELECTROSURGICAL) ×1 IMPLANT
GLOVE BIO SURGEON STRL SZ 6.5 (GLOVE) ×2 IMPLANT
GLOVE BIO SURGEON STRL SZ7.5 (GLOVE) ×3 IMPLANT
GLOVE BIOGEL PI IND STRL 7.0 (GLOVE) IMPLANT
GLOVE BIOGEL PI IND STRL 7.5 (GLOVE) IMPLANT
GLOVE BIOGEL PI INDICATOR 7.0 (GLOVE) ×2
GLOVE BIOGEL PI INDICATOR 7.5 (GLOVE) ×3
GLOVE SURG ORTHO 8.0 STRL STRW (GLOVE) ×4 IMPLANT
GOWN STRL NON-REIN LRG LVL3 (GOWN DISPOSABLE) ×7 IMPLANT
GOWN STRL REIN XL XLG (GOWN DISPOSABLE) ×4 IMPLANT
KIT BASIN OR (CUSTOM PROCEDURE TRAY) ×2 IMPLANT
KIT OSTOMY DRAINABLE 2.75 STR (WOUND CARE) ×1 IMPLANT
KIT ROOM TURNOVER OR (KITS) ×2 IMPLANT
LEGGING LITHOTOMY PAIR STRL (DRAPES) IMPLANT
LIGASURE IMPACT 36 18CM CVD LR (INSTRUMENTS) ×1 IMPLANT
NS IRRIG 1000ML POUR BTL (IV SOLUTION) ×4 IMPLANT
PACK GENERAL/GYN (CUSTOM PROCEDURE TRAY) ×2 IMPLANT
PAD ARMBOARD 7.5X6 YLW CONV (MISCELLANEOUS) ×3 IMPLANT
PENCIL BUTTON HOLSTER BLD 10FT (ELECTRODE) ×2 IMPLANT
RELOAD PROXIMATE 75MM BLUE (ENDOMECHANICALS) ×2 IMPLANT
RELOAD STAPLE 75 3.8 BLU REG (ENDOMECHANICALS) IMPLANT
SPECIMEN JAR X LARGE (MISCELLANEOUS) ×1 IMPLANT
SPONGE LAP 18X18 X RAY DECT (DISPOSABLE) IMPLANT
STAPLER PROXIMATE 75MM BLUE (STAPLE) ×1 IMPLANT
STAPLER VISISTAT 35W (STAPLE) ×2 IMPLANT
SUCTION POOLE TIP (SUCTIONS) ×2 IMPLANT
SURGILUBE 2OZ TUBE FLIPTOP (MISCELLANEOUS) IMPLANT
SUT NOVA 1 T20/GS 25DT (SUTURE) ×4 IMPLANT
SUT PDS AB 1 TP1 96 (SUTURE) ×2 IMPLANT
SUT PROLENE 2 0 CT2 30 (SUTURE) IMPLANT
SUT PROLENE 2 0 KS (SUTURE) IMPLANT
SUT SILK 2 0 (SUTURE) ×2
SUT SILK 2 0 SH CR/8 (SUTURE) ×2 IMPLANT
SUT SILK 2-0 18XBRD TIE 12 (SUTURE) ×1 IMPLANT
SUT SILK 3 0 (SUTURE) ×2
SUT SILK 3 0 SH CR/8 (SUTURE) ×2 IMPLANT
SUT SILK 3-0 18XBRD TIE 12 (SUTURE) ×1 IMPLANT
SUT VIC AB 3-0 SH 18 (SUTURE) ×2 IMPLANT
SYR BULB IRRIGATION 50ML (SYRINGE) ×2 IMPLANT
TOWEL OR 17X26 10 PK STRL BLUE (TOWEL DISPOSABLE) ×3 IMPLANT
TRAY FOLEY CATH 14FRSI W/METER (CATHETERS) ×1 IMPLANT
TRAY PROCTOSCOPIC FIBER OPTIC (SET/KITS/TRAYS/PACK) IMPLANT
TUBE CONNECTING 12X1/4 (SUCTIONS) ×2 IMPLANT
WATER STERILE IRR 1000ML POUR (IV SOLUTION) IMPLANT
YANKAUER SUCT BULB TIP NO VENT (SUCTIONS) ×2 IMPLANT

## 2013-09-06 NOTE — Progress Notes (Signed)
Called to s/u vent on post-op pt. In bay-04 in PACU, arrived @ 1520 hrs.

## 2013-09-06 NOTE — Preoperative (Signed)
Beta Blockers   Reason not to administer Beta Blockers:Not Applicable 

## 2013-09-06 NOTE — Transfer of Care (Signed)
Immediate Anesthesia Transfer of Care Note  Patient: [name]  Procedure(s) Performed: Procedure(s): PARTIAL COLECTOMY WITH COLOSTOMY (N/A)  Patient Location: PACU  Anesthesia Type:General  Level of Consciousness: awake, alert , patient cooperative and responds to stimulation  Airway & Oxygen Therapy: Patient Spontanous Breathing, Patient remains intubated per anesthesia plan and Patient placed on Ventilator (see vital sign flow sheet for setting)  Post-op Assessment: Report given to PACU RN, Post -op Vital signs reviewed and stable and Patient moving all extremities  Post vital signs: Reviewed and stable  Complications: respiratory complications

## 2013-09-06 NOTE — Progress Notes (Addendum)
Patient Name: [name] Date of Encounter: 09/06/2013  Active Problems:   Ileus   Small bowel obstruction   SBO (small bowel obstruction)   Abdominal pain, generalized   Preop cardiovascular exam    SUBJECTIVE: No chest pain, no SOB, ready for surgery  OBJECTIVE Filed Vitals:   09/05/13 2235 09/06/13 0523 09/06/13 0639 09/06/13 1044  BP: 162/70 185/62 184/60 182/57  Pulse: 83 82 77 76  Temp: 98.4 F (36.9 C)  98 F (36.7 C) 98.6 F (37 C)  TempSrc: Oral   Oral  Resp: 16  16 18   Height:      Weight:      SpO2: 97%  99% 94%    Intake/Output Summary (Last 24 hours) at 09/06/13 1047 Last data filed at 09/06/13 0545  Gross per 24 hour  Intake      0 ml  Output    750 ml  Net   -750 ml   Filed Weights   09/02/13 1830  Weight: 165 lb 12.6 oz (75.2 kg)    PHYSICAL EXAM General: Well developed, well nourished, female in no acute distress. Head: Normocephalic, atraumatic.  Neck: Supple without bruits, JVD not elevated. Lungs:  Resp regular and unlabored, few rales bases, good air exchange. Heart: RRR, S1, S2, no S3, S4, or murmur; no rub. Abdomen: Soft, non-tender, non-distended, BS + x 4.  Extremities: No clubbing, cyanosis, no edema.  Neuro: Alert and oriented X 3. Moves all extremities spontaneously. Psych: Normal affect.  LABS: BNP: Pro B Natriuretic peptide (BNP)  Date/Time Value Range Status  09/03/2013  6:38 AM 1298.0* 0 - 450 pg/mL Final  11/07/2010 10:32 AM 146.0* 0.0 - 100.0 pg/mL Final   Fasting Lipid Panel: Recent Labs  09/05/13 1115  CHOL 162  HDL 48  LDLCALC 87  TRIG 133  CHOLHDL 3.4   TELE:  Irreg HR, not currently on a monitor, was atrial fib yesterday  ECHO: 09/06/2013 Study Conclusions - Left ventricle: Inferobasal hypokinesis The cavity size was normal. Wall thickness was increased in a pattern of mild LVH. Systolic function was normal. The estimated ejection fraction was in the range of 55% to 60%. Wall motion was  normal; there were no regional wall motion abnormalities. - Aortic valve: Trivial regurgitation. - Mitral valve: Calcified annulus. - Atrial septum: There was increased thickness of the septum, consistent with lipomatous hypertrophy. No defect or patent foramen ovale was identified.   Current Medications:  . cefTRIAXone (ROCEPHIN)  IV  1 g Intravenous Q24H  . ertapenem  1 g Intravenous On Call to OR  . insulin aspart  0-9 Units Subcutaneous Q4H  . metoprolol  2.5 mg Intravenous Q6H   . dextrose 5 % and 0.9% NaCl 100 mL/hr at 09/06/13 0214    ASSESSMENT AND PLAN: Active Problems:   Ileus/Small bowel obstruction - for colostomy today, per CCS   SBO (small bowel obstruction)   Abdominal pain, generalized   Preop cardiovascular exam - echo results above, her EF is preserved, she is on IV BB. Will need to watch volume status closely. Will follow up after surgery.   Signed, Theodore Demark , PA-C 10:47 AM 09/06/2013  Patient seen, examined. Available data reviewed. Agree with findings, assessment, and plan as outlined by Theodore Demark, PA-C. The pt is seen in the PACU following her surgery. Her heart rhythm is stable. Agree with findings above. I have reviewed echo study which shows normal LV function.   Tonny Bollman, M.D. 09/06/2013  4:44 PM

## 2013-09-06 NOTE — Progress Notes (Signed)
Pt arrived to PACU from OR. Placed on ventilator. Pt following commands , nodding head appropriately to questions. Medicated for pain by CRNA . Resting with ventilator support.

## 2013-09-06 NOTE — Progress Notes (Signed)
Family Medicine Teaching Service Daily Progress Note Intern Pager: [phone]  Patient name: [name] Medical record number: [id number] Date of birth: 06/15/1930 Age: 79 y.o. Gender: female  Primary Care Provider: Kaleen Mask, MD Consultants: General Surgery, GI Code Status: Full  Pt Overview and Major Events to Date:  12/7 - CT Abd showing distended R-colon, ileus / partial SBO, "apple-core lesion" suspicious for malignancy 12/8 - c/s GI --> plan for colonoscopy 12/9 12/9 - scheduled colonoscopy, Gen surg cont to follow (awaiting results - aware of potential need for surgery) 12/10 - Colonoscopy (obstructing descending colon mass), tentative surgery 12/11 for resection pending cardiac clearance / medical stability 12/11 - ECHO normal EF, scheduled for colon resection of adenocarcinoma today  Assessment and Plan:  [name] is a 79 y.o. female presenting with abdominal pain, nausea and vomiting consistent with ileus and with possible bowel obstruction. PMH is significant for DM type 2, left sided CVA, atrial fibrillation, Chronic HF w/ preserved EF, and HTN.  # Abdominal Pain/Distention, secondary to Obstructing Colon Adenocarcinoma Ileus/SBO of small bowel as well as ileus of colon with thickening of colon at splenic flexure concerning for mass noted on CT scan, also possibly related to adhesions from abdominal hysterectomy in 1980s; Of note GI consult note in 2011 states that pt has a history of SBO, but pt and daughter deny this at present; No acute abdomen on admission. Currently abdominal pain improved today (2/10) mostly RLQ, NGT in place (d/t vomiting) with moderate amount fluid suctioned. Try to minimize opiate use with ileus - c/s General Surgery - greatly appreciate all recs     - Cleared for surgery per cardiology, s/p ECHO with normal EF, scheduled for partial colon resection of mass, with colostomy placement - c/s GI Deboraha Sprang) - greatly appreciate all recs    - 12/9 colonoscopy --> obstructing descending colon mass - identified as adenocarcinoma per biopsy  # Pre-operative Clearance Multiple surgical co-morbidities, AFib, T2DM, CHF, h/o CVA - c/s Cardiology for pre-op clearance    - f/u detailed recommendations, BB, lipid panel, ECHO determine LV fxn, cautious fluid hydration         - ECHO with normal EF, cleared for surgery today  # Chronic Constipation - Pt recently went 2 weeks without BM, likely due to daily opiate use and being completely sedentary. Reportedly had multiple BM o/n. Previous enemas for colonoscopy prep successful. - plan to start regular bowel regimen when tolerating PO  - Given suppository as long as no SBO  # Urinary Tract Infection, uncomplicated U/A showing + leukocytes and bacteria; previous UTI in July 214 was E. Coli sensitive to CTX. Currently stable, without dysuria, afebrile, VSS. Noted improved odor with urine, expect good response to CTX. - S/p 1 gm CTX in ED (12/8) - completed CTX 3 Day course - Urine culture (E. Coli >100,000CFU, sensitive to CTX, Nitro, tobramycin, Resistant to Cefazolin) - Will call [facility] to re-schedule outpt follow-up (pt missed appointment due to hospitalization), re: frequent UTIs  # Atrial Fibrillation - Patient denies known history and not on anticoagulation (CHADSVASC score 9); EKG showed A-fib on admission and present on multiple EKG's in 2013  - Hemodynamically stable and history of GI bleeding in 2011, so no anticoagulation at this time (HAS Bled Score 5)  - Continue to monitor on telemetry - currently HR stable 74-95, consider starting rate control - low dose BB, consider resuming regular dose post-op  # Chronic HF, with preserved EF - Grade  1 diastolic dysfunction per Echo 9811; Pt denies knowledge of this problem but numerous discharge summaries mention this and pt was taking toprol and lisinopril in past; No current evidence of volume overload  - ECHO normal EF -  Consider beta blocker and ACE-I as tolerated postop  # COPD - Hx of taking Spiriva and Albuterol, but currently taking no meds; No evience of COPD on exam with good air movement and no wheezing  - Use albuterol inhaler PRN  # Tobacco Abuse - current daily smoke, pack years > 50  - Nicotine patch PRN  # Diabetes Mellitus - Pt claims her diabetes "went away" and has not been on medication for years  - Hgb A1c 7.2 (09/03/13) - Q4 hr CBGs --> 90-117, resume Q4 hr AC once diet resumed - SSI sesnitive (has not received any)  # Cerebrovascular Disease - s/p left sided CVA 2008; History of taking Aggrenox but stopped in 2011 after upper GI bleed from multiple bleeding ulcers and started Plavix, but not currently taking any meds for this problem  - Given history of stroke, pt likely needs to restart Plavix  # HLD - Previously taking simvastatin 40 mg, but no on any medications - Pt needs to be on Lipitor 80 mg given history of stroke  - Start when ileus resolved  # Memory Deficits / Functional Decline - Pt admitted memory loss, not oriented to time; likely component of vascular dementia  - Consider MMSE vs MiniCog during admission  - Ask questions of daughter [name] [phone] - PT/OT - PT saw on 12/8, pt too weak to stand, continue to follow  FEN/GI: NPO with NGT, D5-NS for MIVF (cautious with rehydration)  Prophylaxis: Heparin SQ TID  Disposition: Admit to [facility] Medicine inpatient service, pending further diagnostic work-up and treatment, pending improved clinical course, scheduled for surgery today (colon mass resection), pending post-op course will plan on patient remaining hospitalized for multiple days, consider further eval of SNF vs home at later date - CSW following per discharge plans  Subjective: No acute events reported overnight. Discussed patient's care and current situation with her daughter Salvadore Dom) last night on the phone. This morning Mrs. Madkins was not  present in the room when I arrived, she had gone down for ECHO. Discussed with her nurse, no new events or changes. Noted some nausea responding to therapy. Otherwise stable. Will return to see patient later today and check on her post-op.  Objective: Temp:  [98 F (36.7 C)-98.6 F (37 C)] 98 F (36.7 C) (12/11 0639) Pulse Rate:  [68-95] 77 (12/11 0639) Resp:  [16] 16 (12/11 0639) BP: (130-185)/(60-86) 184/60 mmHg (12/11 0639) SpO2:  [97 %-99 %] 99 % (12/11 1308) Physical Exam: Unable to examine. Patient not in room. Will return later today.  Laboratory:  Recent Labs Lab 09/02/13 1220 09/02/13 1242 09/03/13 0638  WBC 14.5*  --  10.3  HGB 13.0 15.0 12.0  HCT 39.4 44.0 36.7  PLT 471*  --  398    Recent Labs Lab 09/02/13 1220 09/02/13 1242 09/02/13 2010 09/03/13 0638  NA  --  136  --  134*  K  --  4.0  --  4.4  CL  --  104  --  102  CO2  --   --   --  22  BUN  --  26*  --  20  CREATININE  --  1.20* 1.01 1.03  CALCIUM  --   --   --  8.6  PROT 7.6  --   --  6.7  BILITOT 0.3  --   --  0.3  ALKPHOS 67  --   --  58  ALT <5  --   --  5  AST 9  --   --  8  GLUCOSE  --  160*  --  119*    HgbA1c 7.2  ProBNP 1298.0 (prior baseline 200s)  PT 13.5 / INR 1.05, APTT 28  12/7 UA Large leuks, neg nitrite, mod hgb, WBC TNTC, rare squam, many bacteria - suggestive of UTI  12/7 Urine Culture (E. Coli >100,000 CFU, sensitive to CTX)  Imaging/Diagnostic Tests:  12/7 Acute Abd / Chest Xray IMPRESSION:  No acute cardiopulmonary findings.  Air-filled small bowel loops and colon with mild distention possibly  due to gastroenteritis or ileus. No free air  12/7 CT Abd / Pelvis IMPRESSION:  1. There is distension of the right colon and transverse colon with  air-fluid level. Distended distal small bowel loops with air-fluid  level suspicious for ileus or partial small bowel obstruction. There  is a apple core thickening of the colonic wall in the splenic  flexure of the  colon axial image 31 and 32 highly suspicious for  colonic malignancy. Further correlation with colonoscopy is  recommended.  2. No hydronephrosis or hydroureter.  12/9 Colonoscopy Obstructing descending colon mass. Biopsy positive for Adenocarcinoma.  12/11 2D ECHO - Left ventricle: Inferobasal hypokinesis The cavity size was normal. Wall thickness was increased in a pattern of mild LVH. Systolic function was normal. The estimated ejection fraction was in the range of 55% to 60%. Wall motion was normal; there were no regional wall motion abnormalities. - Aortic valve: Trivial regurgitation. - Mitral valve: Calcified annulus. - Atrial septum: There was increased thickness of the septum, consistent with lipomatous hypertrophy. No defect or patent foramen ovale was identified.  Saralyn Pilar, DO 09/06/2013, 8:28 AM PGY-1, [facility] FPTS Intern pager: [phone], text pages welcome

## 2013-09-06 NOTE — Brief Op Note (Signed)
09/02/2013 - 09/06/2013  2:41 PM  PATIENT:  [name]  79 y.o. female  PRE-OPERATIVE DIAGNOSIS:  obstructing neoplasm of colon  POST-OPERATIVE DIAGNOSIS:  obstructing neoplasm of colon  PROCEDURE:  Procedure(s): PARTIAL COLECTOMY WITH COLOSTOMY (N/A)  SURGEON:  Surgeon(s) and Role:    * Velora Heckler, MD - Primary  PHYSICIAN ASSISTANT:  Emina Riebock, ANPBC  ANESTHESIA:   general  EBL:  Total I/O In: 1250 [I.V.:1000; IV Piggyback:250] Out: 200 [Urine:175; Blood:25]  BLOOD ADMINISTERED:none  DRAINS: none   LOCAL MEDICATIONS USED:  NONE  SPECIMEN:  Excision  DISPOSITION OF SPECIMEN:  PATHOLOGY  COUNTS:  YES  TOURNIQUET:  * No tourniquets in log *  DICTATION: .Other Dictation: Dictation Number [id number]  PLAN OF CARE: Admit to inpatient   PATIENT DISPOSITION:  PACU - hemodynamically stable.   Delay start of Pharmacological VTE agent (>24hrs) due to surgical blood loss or risk of bleeding: yes  Velora Heckler, MD, [facility] Office: [phone]

## 2013-09-06 NOTE — Progress Notes (Signed)
General Surgery [facility]  Discussed with anesthesia.  Patient very weak and unable to extubate post-op in PACU.  Will move to ICU overnight for monitoring and I have asked CCM (Dr. Craige Cotta) to assist with weaning and extubation, likely tomorrow.  Otherwise, stable post op.  Velora Heckler, MD, [facility] Office: [phone]

## 2013-09-06 NOTE — Progress Notes (Signed)
Dr Randa Evens updated. Pt to remain intubated and go to ICU.

## 2013-09-06 NOTE — Consult Note (Signed)
PULMONARY  / CRITICAL CARE MEDICINE  Name: [name] MRN: [id number] DOB: 09-03-1930    ADMISSION DATE:  09/02/2013 CONSULTATION DATE:  09/06/2013   REFERRING MD :  Darnell Level  CHIEF COMPLAINT:  Abdominal pain  BRIEF PATIENT DESCRIPTION:  79 yo female admitted with abdominal pain, nausea, vomiting and found to have colon mass.  She had resection of mass, and remained on vent post-op.  PCCM consulted to assist with vent management.  SIGNIFICANT EVENTS: 12/07 Admit, CCS consulted 12/08 GI consulted 12/10 Cardiology consulted for pre-op assessment 12/11 Partial colectomy with colostomy, remained on vent post-op  STUDIES:  12/07 CT abd/pelvis >> Rt colon/transverse colon distention, apple core thickening colon wall in splenic flexure 1209 Colonoscopy >> mass in descending colon >> adenocarcinoma 12/11 Echo >> mild LVH, EF 55 to 60%  LINES / TUBES: ETT 12/11 >>   CULTURES: Urine 12/07 >> E coli  ANTIBIOTICS: Rocephin 12/07 >> 12/10 Ertapenem 12/11 >>   HISTORY OF PRESENT ILLNESS:   79 yo female presented to the ED with abdominal pain.  She had CT abd/pelvis which showed apple core lesion in proximal colon.  She had colonoscopy which showed mass lesion >> biopsy showed adenocarcinoma.  She had partial colectomy with colostomy on 12/11.  She was slow to recover from anesthesia, and decision was made to keep her on ventilator in ICU overnight.  PCCM consulted to assist with vent management.  PAST MEDICAL HISTORY :  Past Medical History  Diagnosis Date  . COPD (chronic obstructive pulmonary disease)   . CHF (congestive heart failure)     ECHO 2011  EF: 60% to 65%, grd 1 distolic dysfxn, 40-98% 2012 ECHO, tech limited by Afib  . Hypertension   . Stroke 2008    rt side deficit  . A-fib   . Diabetes mellitus   . Back pain   . Constipation   . SBO (small bowel obstruction)     per GI note in 2011, pt denies  . Upper GI bleed 2011    required hospitalization  . Colon  polyps   . Tobacco abuse    Past Surgical History  Procedure Laterality Date  . Total abdominal hysterectomy  1980  . Colonoscopy N/A 09/04/2013    Procedure: COLONOSCOPY;  Surgeon: Shirley Friar, MD;  Location: [facility] ENDOSCOPY;  Service: Endoscopy;  Laterality: N/A;   Prior to Admission medications   Medication Sig Start Date End Date Taking? Authorizing Provider  Docusate Calcium (STOOL SOFTENER PO) Take 2 capsules by mouth daily as needed. For constipation   Yes Historical Provider, MD  morphine (MS CONTIN) 30 MG 12 hr tablet Take 30 mg by mouth 2 (two) times daily.     Yes Historical Provider, MD  nitroGLYCERIN (NITROSTAT) 0.4 MG SL tablet Place 0.4 mg under the tongue every 5 (five) minutes as needed for chest pain.   Yes Historical Provider, MD  nortriptyline (PAMELOR) 25 MG capsule Take 100 mg by mouth at bedtime.   Yes Historical Provider, MD  traMADol (ULTRAM) 50 MG tablet Take 100 mg by mouth 2 (two) times daily.   Yes Historical Provider, MD  traZODone (DESYREL) 150 MG tablet Take 150 mg by mouth at bedtime.     Yes Historical Provider, MD  sulfamethoxazole-trimethoprim (BACTRIM DS,SEPTRA DS) 800-160 MG per tablet Take 1 tablet by mouth 2 (two) times daily. For 14 days 08/23/13   Historical Provider, MD   Allergies  Allergen Reactions  . Aggrenox [Aspirin-Dipyridamole Er] Diarrhea and  Nausea And Vomiting    FAMILY HISTORY:  History reviewed. No pertinent family history. SOCIAL HISTORY:  reports that she has been smoking Cigarettes.  She has a 7.5 pack-year smoking history. She does not have any smokeless tobacco history on file. She reports that she drinks alcohol. She reports that she does not use illicit drugs.  REVIEW OF SYSTEMS:   Unable to obtain.  SUBJECTIVE:   VITAL SIGNS: Temp:  [97.7 F (36.5 C)-98.6 F (37 C)] 97.7 F (36.5 C) (12/11 1720) Pulse Rate:  [59-86] 76 (12/11 1800) Resp:  [14-24] 20 (12/11 1800) BP: (136-185)/(34-72) 167/52 mmHg (12/11  1800) SpO2:  [94 %-100 %] 100 % (12/11 1800) FiO2 (%):  [30 %] 30 % (12/11 1800) Weight:  [154 lb 12.2 oz (70.2 kg)] 154 lb 12.2 oz (70.2 kg) (12/11 1800) HEMODYNAMICS:   VENTILATOR SETTINGS: Vent Mode:  [-] PRVC FiO2 (%):  [30 %] 30 % Set Rate:  [14 bmp] 14 bmp Vt Set:  [400 mL] 400 mL PEEP:  [5 cmH20] 5 cmH20 Plateau Pressure:  [16 cmH20] 16 cmH20 INTAKE / OUTPUT: Intake/Output     12/10 0701 - 12/11 0700 12/11 0701 - 12/12 0700   I.V. (mL/kg)  1100 (15.7)   IV Piggyback  250   Total Intake(mL/kg)  1350 (19.2)   Urine (mL/kg/hr)  225 (0.3)   Emesis/NG output 750 (0.4)    Blood  25 (0)   Total Output 750 250   Net -750 +1100        Urine Occurrence 4 x 1 x     PHYSICAL EXAMINATION: General: No apparent distress. Intubated. Awake Eyes: Anicteric sclerae. ENT: ETT in place. Trachea at midline. Lymph: No cervical, supraclavicular, or axillary lymphadenopathy. Heart: Normal S1, S2. No murmurs, rubs, or gallops appreciated. No bruits, equal pulses. Lungs: Normal excursion, no dullness to percussion. Good air movement bilaterally, without wheezes or crackles. Normal upper airway sounds without evidence of stridor. Abdomen: Abdomen soft, Colostomy present. No bowel sounds. Tender on palpation.  Musculoskeletal: No clubbing or synovitis. Skin: No rashes or lesions Neuro: No focal neurologic deficits. Awake, follows commands and communicates.   LABS:  CBC  Recent Labs Lab 09/02/13 1220 09/02/13 1242 09/03/13 0638  WBC 14.5*  --  10.3  HGB 13.0 15.0 12.0  HCT 39.4 44.0 36.7  PLT 471*  --  398   Coag's  Recent Labs Lab 09/02/13 1220  APTT 28  INR 1.05   BMET  Recent Labs Lab 09/02/13 1242 09/02/13 2010 09/03/13 0638  NA 136  --  134*  K 4.0  --  4.4  CL 104  --  102  CO2  --   --  22  BUN 26*  --  20  CREATININE 1.20* 1.01 1.03  GLUCOSE 160*  --  119*   Electrolytes  Recent Labs Lab 09/03/13 0638  CALCIUM 8.6   Sepsis Markers  Recent  Labs Lab 09/02/13 1243  LATICACIDVEN 2.29*   ABG No results found for this basename: PHART, PCO2ART, PO2ART,  in the last 168 hours Liver Enzymes  Recent Labs Lab 09/02/13 1220 09/03/13 0638  AST 9 8  ALT <5 5  ALKPHOS 67 58  BILITOT 0.3 0.3  ALBUMIN 3.3* 2.9*   Cardiac Enzymes  Recent Labs Lab 09/03/13 0638  PROBNP 1298.0*   Glucose  Recent Labs Lab 09/05/13 1636 09/05/13 2011 09/06/13 0004 09/06/13 0408 09/06/13 0730 09/06/13 1542  GLUCAP 115* 136* 126* 141* 113* 130*    Imaging  Dg Chest Port 1 View  09/06/2013   CLINICAL DATA:  Endotracheal tube placement  EXAM: PORTABLE CHEST - 1 VIEW  COMPARISON:  09/02/2013  FINDINGS: Endotracheal tube is appropriately positioned. Nasogastric tube terminates below the level of the hemidiaphragms but is not included in the field of view. Hazy contour to the hemidiaphragms may indicate trace pleural effusions tracking posteriorly. Right lower lobe atelectasis or consolidation is noted with right hemidiaphragmatic elevation since the prior study.  IMPRESSION: Endotracheal tube appropriately positioned.  Right lower lobe atelectasis.  Early pneumonia could appear similar.   Electronically Signed   By: Christiana Pellant M.D.   On: 09/06/2013 18:26     ASSESSMENT / PLAN:  PULMONARY A: Remains on vent post-op. Atelectasis. Hx of COPD. Tobacco abuse. P:   -f/u CXR - Mechanical ventilation   - PRVC, Vt: 8cc/kg, PEEP: 5, RR: 16, FiO2: 100% and adjust to keep O2 sat > 94%   - VAP prevention order set -SBT in AM >> likely extubate soon -prn albuterol  CARDIOVASCULAR A:  Hx of HTN, Paroxysmal A fib, chronic diastolic CHF, hyperlipidemia. P:  -per cardiology and primary care team  RENAL A:   No issues. P:   -monitor renal fx, urine outpt, electrolytes  GASTROINTESTINAL A:   Adenocarcinoma colon s/p partial colectomy with colostomy 12/11. P:   -protonix for SUP -post-op care, nutrition per CCS  HEMATOLOGIC A:    No issues. P:  -SCD for DVT prevention >> likely transition to lovenox once okay with surgery -f/u CBC  INFECTIOUS A:   E coli UTI >> completed treatment 12/10. Post-op prophylaxis. P:   -continue ertapenem per CCS  ENDOCRINE A:   DM type II.   P:   -SSI  NEUROLOGIC A:   Post-op pain control. Hx of CVA with vascular dementia. P:   -sedation protocol while on vent   I have personally obtained a history, examined the patient, evaluated laboratory and imaging results, formulated the assessment and plan and placed orders.  CRITICAL CARE: Critical Care Time devoted to patient care services described in this note is 45 minutes.   Overton Mam, MD Pulmonary and Critical Care Medicine [facility] Pager: [phone]  09/06/2013, 6:31 PM

## 2013-09-06 NOTE — Progress Notes (Signed)
PT Cancellation Note  Patient Details Name: [name] MRN: [id number] DOB: 06-08-1930   Cancelled Treatment:    Reason Eval/Treat Not Completed: Patient at procedure or test/unavailable. Patient going for surgery at this time. Will follow up as appropriate   Fredrich Birks 09/06/2013, 10:44 AM 09/06/2013 Fredrich Birks PTA [phone] pager [phone] office

## 2013-09-06 NOTE — Progress Notes (Signed)
Extubation parameters done per request: NIF(-20), FVC- minimal @ 600cc, + cuff leak, rsbi -66 on 5/5, can lift head off bed but on 5/5 Vt's 200cc and falls asleep easily, non Laproscopic surgery done, extensive smoking hx. Receiving pain medication, awaiting bed placement.

## 2013-09-06 NOTE — Progress Notes (Signed)
I discussed with  Dr Karamalegos.  I agree with their plans documented in their progress note for today.  

## 2013-09-06 NOTE — Anesthesia Preprocedure Evaluation (Addendum)
Anesthesia Evaluation  Patient identified by MRN, date of birth, ID band Patient awake    Reviewed: Allergy & Precautions, H&P , NPO status , Patient's Chart, lab work & pertinent test results  History of Anesthesia Complications Negative for: history of anesthetic complications  Airway Mallampati: II  Neck ROM: full    Dental  (+) Edentulous Upper and Edentulous Lower   Pulmonary COPDCurrent Smoker,          Cardiovascular hypertension, +CHF + dysrhythmias Atrial Fibrillation     Neuro/Psych CVA (right hand weak and drawn), Residual Symptoms negative psych ROS   GI/Hepatic negative GI ROS, Neg liver ROS,   Endo/Other  diabetes, Type 2obese  Renal/GU negative Renal ROS     Musculoskeletal   Abdominal   Peds  Hematology   Anesthesia Other Findings   Reproductive/Obstetrics                         Anesthesia Physical Anesthesia Plan  ASA: III  Anesthesia Plan: General   Post-op Pain Management:    Induction: Intravenous  Airway Management Planned: Oral ETT  Additional Equipment:   Intra-op Plan:   Post-operative Plan: Extubation in OR  Informed Consent: I have reviewed the patients History and Physical, chart, labs and discussed the procedure including the risks, benefits and alternatives for the proposed anesthesia with the patient or authorized representative who has indicated his/her understanding and acceptance.     Plan Discussed with: CRNA, Anesthesiologist and Surgeon  Anesthesia Plan Comments:         Anesthesia Quick Evaluation

## 2013-09-06 NOTE — Progress Notes (Signed)
  Echocardiogram 2D Echocardiogram has been performed.  [name] 09/06/2013, 9:20 AM

## 2013-09-06 NOTE — Anesthesia Procedure Notes (Signed)
Procedure Name: Intubation Date/Time: 09/06/2013 12:31 PM Performed by: Sharlene Dory E Pre-anesthesia Checklist: Patient identified, Emergency Drugs available, Suction available, Patient being monitored and Timeout performed Patient Re-evaluated:Patient Re-evaluated prior to inductionOxygen Delivery Method: Circle system utilized Preoxygenation: Pre-oxygenation with 100% oxygen Intubation Type: IV induction and Rapid sequence Laryngoscope Size: Mac and 3 Grade View: Grade I Tube type: Oral Tube size: 7.0 mm Number of attempts: 1 Airway Equipment and Method: Stylet Placement Confirmation: ETT inserted through vocal cords under direct vision,  positive ETCO2 and breath sounds checked- equal and bilateral Secured at: 21 cm Tube secured with: Tape Dental Injury: Teeth and Oropharynx as per pre-operative assessment  Comments: AOI per Vinnie Langton, CRNA

## 2013-09-07 ENCOUNTER — Inpatient Hospital Stay (HOSPITAL_COMMUNITY): Payer: Medicare Other

## 2013-09-07 ENCOUNTER — Encounter (HOSPITAL_COMMUNITY): Payer: Self-pay | Admitting: Surgery

## 2013-09-07 DIAGNOSIS — I480 Paroxysmal atrial fibrillation: Secondary | ICD-10-CM | POA: Diagnosis present

## 2013-09-07 DIAGNOSIS — J9600 Acute respiratory failure, unspecified whether with hypoxia or hypercapnia: Secondary | ICD-10-CM

## 2013-09-07 LAB — BASIC METABOLIC PANEL
BUN: 8 mg/dL (ref 6–23)
BUN: 8 mg/dL (ref 6–23)
CO2: 24 mEq/L (ref 19–32)
Calcium: 7.2 mg/dL — ABNORMAL LOW (ref 8.4–10.5)
Chloride: 104 mEq/L (ref 96–112)
Chloride: 106 mEq/L (ref 96–112)
Creatinine, Ser: 0.72 mg/dL (ref 0.50–1.10)
GFR calc Af Amer: >90 mL/min (ref >90)
GFR calc non Af Amer: 76 mL/min — ABNORMAL LOW (ref >90)
Glucose, Bld: 198 mg/dL — ABNORMAL HIGH (ref 70–99)
Glucose, Bld: 146 mg/dL — ABNORMAL HIGH (ref 70–99)
Potassium: 2.6 mEq/L — CL (ref 3.5–5.1)
Sodium: 139 mEq/L (ref 135–145)

## 2013-09-07 LAB — CBC
HCT: 31.6 % — ABNORMAL LOW (ref 36.0–46.0)
Hemoglobin: 10.5 g/dL — ABNORMAL LOW (ref 12.0–15.0)
MCH: 27.7 pg (ref 26.0–34.0)
MCV: 83.4 fL (ref 78.0–100.0)
Platelets: 305 10*3/uL (ref 150–400)
RBC: 3.79 MIL/uL — ABNORMAL LOW (ref 3.87–5.11)
WBC: 10.7 10*3/uL — ABNORMAL HIGH (ref 4.0–10.5)

## 2013-09-07 LAB — GLUCOSE, CAPILLARY
Glucose-Capillary: 145 mg/dL — ABNORMAL HIGH (ref 70–99)
Glucose-Capillary: 148 mg/dL — ABNORMAL HIGH (ref 70–99)

## 2013-09-07 MED ORDER — POTASSIUM CHLORIDE 10 MEQ/100ML IV SOLN
10.0000 meq | INTRAVENOUS | Status: AC
  Administered 2013-09-07 (×4): 10 meq via INTRAVENOUS
  Filled 2013-09-07 (×4): qty 100

## 2013-09-07 MED ORDER — FENTANYL CITRATE 0.05 MG/ML IJ SOLN
25.0000 ug | INTRAMUSCULAR | Status: DC | PRN
  Administered 2013-09-07 – 2013-09-08 (×7): 50 ug via INTRAVENOUS
  Filled 2013-09-07 (×7): qty 2

## 2013-09-07 MED ORDER — SODIUM CHLORIDE 0.9 % IV BOLUS (SEPSIS)
500.0000 mL | Freq: Once | INTRAVENOUS | Status: AC
  Administered 2013-09-07: 500 mL via INTRAVENOUS

## 2013-09-07 MED ORDER — INSULIN ASPART 100 UNIT/ML ~~LOC~~ SOLN
0.0000 [IU] | SUBCUTANEOUS | Status: DC
  Administered 2013-09-07 – 2013-09-11 (×15): 2 [IU] via SUBCUTANEOUS
  Administered 2013-09-11: 3 [IU] via SUBCUTANEOUS
  Administered 2013-09-11: 2 [IU] via SUBCUTANEOUS
  Administered 2013-09-11: 3 [IU] via SUBCUTANEOUS

## 2013-09-07 MED ORDER — KCL IN DEXTROSE-NACL 40-5-0.45 MEQ/L-%-% IV SOLN
INTRAVENOUS | Status: DC
  Administered 2013-09-07 – 2013-09-08 (×2): via INTRAVENOUS
  Administered 2013-09-09: 1000 mL via INTRAVENOUS
  Administered 2013-09-09: via INTRAVENOUS
  Administered 2013-09-10: 50 mL/h via INTRAVENOUS
  Administered 2013-09-10 – 2013-09-11 (×3): via INTRAVENOUS
  Filled 2013-09-07 (×14): qty 1000

## 2013-09-07 NOTE — Progress Notes (Signed)
Physical Therapy Treatment Patient Details Name: [name] MRN: [id number] DOB: 05/07/1930 Today's Date: 09/07/2013 Time: 4098-1191 PT Time Calculation (min): 10 min  PT Assessment / Plan / Recommendation  History of Present Illness Pt is an 79 y/o female admitted with a SBO plus ileus, and was found to have an "apple-core lesion" on a CT scan. 12/11 underwent partial colectomy with colostomy for colon cancer. Remained intubated post-op.   PT Comments   Pt awake/alert on vent. RN reported hopeful plans to extubate later today. Attempted ROM exercises, however pt with increasing RR to 40's and observed to be anxious re: her daughter in the room. Session terminated due to poor tolerance and not wanting to tire pt out too much prior to attempted extubation.   Follow Up Recommendations  SNF     Does the patient have the potential to tolerate intense rehabilitation     Barriers to Discharge        Equipment Recommendations  None recommended by PT    Recommendations for Other Services    Frequency Min 2X/week   Progress towards PT Goals Progress towards PT goals: Not progressing toward goals - comment (s/p surgery 12/11 & currently intubated)  Plan Current plan remains appropriate    Precautions / Restrictions Precautions Precautions: Fall Restrictions Weight Bearing Restrictions: No    Pertinent Vitals/Pain On vent (CPAP, PS, PEEP 5) with RR 25-44 with activity; Daughter arrived just prior to session--lethargic and nearly falling forward asleep in bedside chair in pt's room. Pt visibly distraught over her daughter's appearance. Session concluded after 10 minutes of exercises due to RR maintaining in the 40s)    Mobility  Bed Mobility Bed Mobility: Not assessed Transfers Transfers: Not assessed (current order for [facility] elevated only) Ambulation/Gait Ambulation/Gait Assistance: Not tested (comment)    Exercises General Exercises - Upper Extremity Digit Composite Flexion:  AROM;Both;10 reps;Supine Composite Extension: AROM;Both;10 reps;Supine General Exercises - Lower Extremity Ankle Circles/Pumps: AROM;Both;10 reps;Strengthening;Supine (resisted PF) Quad Sets: AROM;Both;15 reps;Supine Hip ABduction/ADduction: AAROM;Right;10 reps;Supine   PT Diagnosis:    PT Problem List:   PT Treatment Interventions:     PT Goals (current goals can now be found in the care plan section) Acute Rehab PT Goals Patient Stated Goal: currently to get extubated  Visit Information  Last PT Received On: 09/07/13 Assistance Needed: +1 (+2 for OOB) History of Present Illness: Pt is an 79 y/o female admitted with a SBO plus ileus, and was found to have an "apple-core lesion" on a CT scan. 12/11 underwent partial colectomy with colostomy for colon cancer. Remained intubated post-op.    Subjective Data  Subjective: Pt lip-speaking with ETT.  Patient Stated Goal: currently to get extubated   Cognition  Cognition Arousal/Alertness: Awake/alert Behavior During Therapy: Anxious Overall Cognitive Status: Difficult to assess Difficult to assess due to: Intubated    Balance     End of Session PT - End of Session Activity Tolerance: Patient limited by fatigue;Treatment limited secondary to medical complications (Comment) (incr RR to 40s; some coughing) Patient left: in bed;with call bell/phone within reach;with nursing/sitter in room Nurse Communication: Other (comment) (RN present in room throughout session)   GP     Alivya Wegman 09/07/2013, 1:58 PM Pager [phone]

## 2013-09-07 NOTE — Progress Notes (Signed)
eLink Physician-Brief Progress Note Patient Name: [name] DOB: [birth year] MRN: [id number]  Date of Service  09/07/2013   HPI/Events of Note   Oliguria, vitals good  eICU Interventions  500 cc bolus now   Intervention Category Intermediate Interventions: Oliguria - evaluation and management  MCQUAID, DOUGLAS 09/07/2013, 2:09 AM

## 2013-09-07 NOTE — Consult Note (Signed)
PULMONARY  / CRITICAL CARE MEDICINE  Name: [name] MRN: [id number] DOB: January 09, 1930    ADMISSION DATE:  09/02/2013 CONSULTATION DATE:  09/06/2013   REFERRING MD :  Darnell Level  CHIEF COMPLAINT:  Abdominal pain  BRIEF PATIENT DESCRIPTION:  79 yo female admitted with abdominal pain, nausea, vomiting and found to have colon mass.  She had resection of mass, and remained on vent post-op.  PCCM consulted to assist with vent management.  SIGNIFICANT EVENTS: 12/07 Admit, CCS consulted 12/08 GI consulted 12/10 Cardiology consulted for pre-op assessment 12/11 Partial colectomy with colostomy, remained on vent post-op  STUDIES:  12/07 CT abd/pelvis >> Rt colon/transverse colon distention, apple core thickening colon wall in splenic flexure 1209 Colonoscopy >> mass in descending colon >> adenocarcinoma 12/11 Echo >> mild LVH, EF 55 to 60%  LINES / TUBES: ETT 12/11 >>   CULTURES: Urine 12/07 >> E coli  ANTIBIOTICS: Rocephin 12/07 >> 12/10 Ertapenem 12/11 >>    REVIEW OF SYSTEMS:   Unable to obtain.  SUBJECTIVE:   VITAL SIGNS: Temp:  [97.7 F (36.5 C)-99 F (37.2 C)] 98.3 F (36.8 C) (12/12 1127) Pulse Rate:  [59-93] 71 (12/12 1200) Resp:  [14-27] 25 (12/12 1200) BP: (130-176)/(34-87) 148/43 mmHg (12/12 1200) SpO2:  [94 %-100 %] 100 % (12/12 1200) FiO2 (%):  [30 %] 30 % (12/12 0800) Weight:  [70.2 kg (154 lb 12.2 oz)-71.1 kg (156 lb 12 oz)] 71.1 kg (156 lb 12 oz) (12/12 0600) HEMODYNAMICS:   VENTILATOR SETTINGS: Vent Mode:  [-] CPAP;PSV FiO2 (%):  [30 %] 30 % Set Rate:  [14 bmp] 14 bmp Vt Set:  [400 mL] 400 mL PEEP:  [5 cmH20] 5 cmH20 Pressure Support:  [5 cmH20] 5 cmH20 Plateau Pressure:  [15 cmH20-18 cmH20] 18 cmH20 INTAKE / OUTPUT: Intake/Output     12/11 0701 - 12/12 0700 12/12 0701 - 12/13 0700   I.V. (mL/kg) 2900 (40.8) 251.3 (3.5)   IV Piggyback 950 100   Total Intake(mL/kg) 3850 (54.1) 351.3 (4.9)   Urine (mL/kg/hr) 420 (0.2) 67 (0.1)   Emesis/NG output 100 (0.1)    Blood 25 (0)    Total Output 545 67   Net +3305 +284.3        Urine Occurrence 1 x      PHYSICAL EXAMINATION: General: NAD HEENT WNL Heart: RRR s M Lungs: Clear Abdomen: Soft, NT, NABS Ext: warm, no edema Neuro: No focal deficits   LABS: I have reviewed all of today's lab results. Relevant abnormalities are discussed in the A/P section  CXR: prob mild bibasilar atx  ASSESSMENT / PLAN:  PULMONARY A: Acute resp failure, vent Hx of COPD. Tobacco abuse. P:   Extubate 12/12 Watch in ICU post ext  CARDIOVASCULAR A:  Hx of HTN, Paroxysmal A fib, chronic diastolic CHF, hyperlipidemia. P:  -per cardiology and primary care team  RENAL A:   No issues. P:   -monitor renal fx, urine outpt, electrolytes  GASTROINTESTINAL A:   Adenocarcinoma colon s/p partial colectomy with colostomy 12/11. P:   -protonix for SUP -post-op care, nutrition per CCS  HEMATOLOGIC A:   No issues. P:  -SCD for DVT prevention >> likely transition to lovenox once okay with surgery -f/u CBC  INFECTIOUS A:   E coli UTI >> completed treatment 12/10. Post-op prophylaxis. P:   -continue ertapenem per CCS  ENDOCRINE A:   DM type II.   P:   -SSI  NEUROLOGIC A:   Post-op pain control. Hx  of CVA with vascular dementia. P:   -sedation protocol while on vent   I have personally obtained a history, examined the patient, evaluated laboratory and imaging results, formulated the assessment and plan and placed orders.  CRITICAL CARE: Critical Care Time devoted to patient care services described in this note is 30 minutes.     Billy Fischer, MD ; [facility] [phone].  After 5:30 PM or weekends, call [phone]

## 2013-09-07 NOTE — Progress Notes (Signed)
Family Medicine Teaching Service Daily Progress Note Intern Pager: [phone]  Patient name: [name] Medical record number: [id number] Date of birth: 11-27-1929 Age: 79 y.o. Gender: female  Primary Care Provider: Kaleen Mask, MD Consultants: General Surgery, GI Code Status: Full  Pt Overview and Major Events to Date:  12/7 - CT Abd showing distended R-colon, ileus / partial SBO, "apple-core lesion" suspicious for malignancy 12/8 - c/s GI --> plan for colonoscopy 12/9 12/9 - scheduled colonoscopy, Gen surg cont to follow (awaiting results - aware of potential need for surgery) 12/10 - Colonoscopy (obstructing descending colon mass), tentative surgery 12/11 for resection pending cardiac clearance / medical stability 12/11 - ECHO normal EF, partial colon resection w/ colostomy, ICU o/n while on extubation 12/12 - POD #1. Extubated in ICU, plan to transfer to floor  Assessment and Plan:  [name] is a 79 y.o. female presenting with abdominal pain, nausea and vomiting consistent with ileus and with possible bowel obstruction. PMH is significant for DM type 2, left sided CVA, atrial fibrillation, Chronic HF w/ preserved EF, and HTN.  # Abdominal Pain/Distention, secondary to Obstructing Colon Adenocarcinoma Ileus/SBO of small bowel as well as ileus of colon with thickening of colon at splenic flexure concerning for mass noted on CT scan, also possibly related to adhesions from abdominal hysterectomy in 1980s; Of note GI consult note in 2011 states that pt has a history of SBO, but pt and daughter deny this at present; No acute abdomen on admission. Currently abdominal pain improved today (2/10) mostly RLQ, NGT in place (d/t vomiting) with moderate amount fluid suctioned. Try to minimize opiate use with ileus - c/s General Surgery - greatly appreciate all recs     - s/p partial colectomy with colostomy (adenocarcinoma removed)     - [ ]  f/u omental mass pathology     -  expect to remain in hospital for about 1 week recovery, management of ileus, and advance diet     - CEA level ordered in AM, no other staging at this time     - plan for outpatient follow-up arrangements with [facility] (in approx 3 weeks) - c/s GI Deboraha Sprang) - greatly appreciate all recs      - 12/9 colonoscopy --> obstructing descending colon mass - identified as adenocarcinoma per biopsy  # Chronic Constipation Pt recently went 2 weeks without BM, likely due to daily opiate use and being completely sedentary. Reportedly had multiple BM o/n. Previous enemas for colonoscopy prep successful. - plan to start regular bowel regimen when tolerating PO   # Urinary Tract Infection, uncomplicated - Resolved U/A showing + leukocytes and bacteria; previous UTI in July 214 was E. Coli sensitive to CTX. Currently stable, without dysuria, afebrile, VSS. Noted improved odor with urine, expect good response to CTX. - S/p 1 gm CTX in ED (12/8) - completed CTX 3 Day course - Urine culture (E. Coli >100,000CFU, sensitive to CTX, Nitro, tobramycin, Resistant to Cefazolin) - Will call [facility] to re-schedule outpt follow-up (pt missed appointment due to hospitalization), re: frequent UTIs  # Atrial Fibrillation - Patient denies known history and not on anticoagulation (CHADSVASC score 9); EKG showed A-fib on admission and present on multiple EKG's in 2013  - Hemodynamically stable and history of GI bleeding in 2011, so no anticoagulation at this time (HAS Bled Score 5)  - Continue to monitor on telemetry - currently HR stable 74-95, consider starting rate control - low dose BB, consider resuming  regular dose post-op  # Chronic HF, with preserved EF - Grade 1 diastolic dysfunction per Echo 1610; Pt denies knowledge of this problem but numerous discharge summaries mention this and pt was taking toprol and lisinopril in past; No current evidence of volume overload  - ECHO normal EF (55-60%), mild LVH -  Consider beta blocker and ACE-I as tolerated postop  # COPD - Hx of taking Spiriva and Albuterol, but currently taking no meds; No evience of COPD on exam with good air movement and no wheezing  - Use albuterol inhaler PRN  # Tobacco Abuse - current daily smoke, pack years > 50  - Nicotine patch PRN  # Diabetes Mellitus - Pt claims her diabetes "went away" and has not been on medication for years  - Hgb A1c 7.2 (09/03/13) - Q4 hr CBGs --> 90-117, resume Q4 hr AC once diet resumed - SSI sesnitive (has not received any)  # Cerebrovascular Disease - s/p left sided CVA 2008; History of taking Aggrenox but stopped in 2011 after upper GI bleed from multiple bleeding ulcers and started Plavix, but not currently taking any meds for this problem  - Given history of stroke, pt likely needs to restart Plavix  # HLD - Previously taking simvastatin 40 mg, but no on any medications - Pt needs to be on Lipitor 80 mg given history of stroke  - Start when ileus resolved  # Memory Deficits / Functional Decline - Pt admitted memory loss, not oriented to time; likely component of vascular dementia  - Consider MMSE vs MiniCog during admission  - Ask questions of daughter [name] [phone] - PT/OT - PT saw on 12/8, pt too weak to stand, continue to follow  # Pre-operative Clearance Multiple surgical co-morbidities, AFib, T2DM, CHF, h/o CVA - c/s Cardiology for pre-op clearance    - f/u detailed recommendations, BB, lipid panel, ECHO determine LV fxn, cautious fluid hydration         - ECHO with normal EF, cleared for surgery  FEN/GI: NPO with NGT, D5-NS for MIVF (cautious with rehydration)  Prophylaxis: Heparin SQ TID  Disposition: Admit to Family Medicine inpatient service, continued to be followed by surgery post-op, managed o/n in ICU on vent, plan to transfer to the floor today, expect about multiple day to 1 week recovery from surgery, pending clinical course improvement will continue to  work on future placement plans with CSW, expect discharge to SNF - CSW following per discharge plans  Subjective: Yesterday post-op from partial colectomy she remained slow to recover from the anesthesia with respiratory failure and was unable to be extubated. CCM consulted and she remained on the ventilator overnight in the ICU. Overall, this morning she has done very well with spontaneous breathing and awake, completed extubation around 11:00am, and will be observed in ICU for period of time after extubation, then plan to transfer to the floor. Will continue to follow along with surgery. Family was not in room when I visited, and will update them at a later time.  Objective: Temp:  [97.7 F (36.5 C)-99 F (37.2 C)] 98.3 F (36.8 C) (12/12 1127) Pulse Rate:  [67-93] 76 (12/12 1600) Resp:  [13-27] 20 (12/12 1600) BP: (130-174)/(32-87) 147/46 mmHg (12/12 1600) SpO2:  [94 %-100 %] 99 % (12/12 1600) FiO2 (%):  [30 %] 30 % (12/12 0800) Weight:  [154 lb 12.2 oz (70.2 kg)-156 lb 12 oz (71.1 kg)] 156 lb 12 oz (71.1 kg) (12/12 0600) Physical Exam: General -  laying in bed, awake and interactive, on vent, with NGT, NAD Heart - RRR, S1/S2, no murmurs heard Lungs - CTAB, good air movement, spontaneous breathing Abd - soft, non-distended, midline surgical wound appears dry and intact Ext - moves all ext, +2 peripheral pulses intact Neuro - awake, alert  Laboratory:  Recent Labs Lab 09/02/13 1220 09/02/13 1242 09/03/13 0638 09/07/13 0357  WBC 14.5*  --  10.3 10.7*  HGB 13.0 15.0 12.0 10.5*  HCT 39.4 44.0 36.7 31.6*  PLT 471*  --  398 305    Recent Labs Lab 09/02/13 1220  09/03/13 0638 09/07/13 0357 09/07/13 0945  NA  --   < > 134* 139 137  K  --   < > 4.4 2.6* 3.9  CL  --   < > 102 104 106  CO2  --   --  22 24 22   BUN  --   < > 20 8 8   CREATININE  --   < > 1.03 0.78 0.72  CALCIUM  --   --  8.6 7.3* 7.2*  PROT 7.6  --  6.7  --   --   BILITOT 0.3  --  0.3  --   --   ALKPHOS 67   --  58  --   --   ALT <5  --  5  --   --   AST 9  --  8  --   --   GLUCOSE  --   < > 119* 198* 146*  < > = values in this interval not displayed.  HgbA1c 7.2  ProBNP 1298.0 (prior baseline 200s)  PT 13.5 / INR 1.05, APTT 28  12/7 UA Large leuks, neg nitrite, mod hgb, WBC TNTC, rare squam, many bacteria - suggestive of UTI  12/7 Urine Culture (E. Coli >100,000 CFU, sensitive to CTX)  Imaging/Diagnostic Tests:  12/7 Acute Abd / Chest Xray IMPRESSION:  No acute cardiopulmonary findings.  Air-filled small bowel loops and colon with mild distention possibly  due to gastroenteritis or ileus. No free air  12/7 CT Abd / Pelvis IMPRESSION:  1. There is distension of the right colon and transverse colon with  air-fluid level. Distended distal small bowel loops with air-fluid  level suspicious for ileus or partial small bowel obstruction. There  is a apple core thickening of the colonic wall in the splenic  flexure of the colon axial image 31 and 32 highly suspicious for  colonic malignancy. Further correlation with colonoscopy is  recommended.  2. No hydronephrosis or hydroureter.  12/9 Colonoscopy Obstructing descending colon mass. Biopsy positive for Adenocarcinoma.  12/11 2D ECHO - Left ventricle: Inferobasal hypokinesis The cavity size was normal. Wall thickness was increased in a pattern of mild LVH. Systolic function was normal. The estimated ejection fraction was in the range of 55% to 60%. Wall motion was normal; there were no regional wall motion abnormalities. - Aortic valve: Trivial regurgitation. - Mitral valve: Calcified annulus. - Atrial septum: There was increased thickness of the septum, consistent with lipomatous hypertrophy. No defect or patent foramen ovale was identified.  12/11 Surgical Pathology (Omental Mass) - Pending  Saralyn Pilar, DO 09/07/2013, 4:46 PM PGY-1, [facility] FPTS Intern pager: [phone], text pages  welcome

## 2013-09-07 NOTE — Progress Notes (Addendum)
eLink Physician-Brief Progress Note Patient Name: [name] DOB: [birth year] MRN: [id number]  Date of Service  09/07/2013   HPI/Events of Note   Oliguria persists Hypokalemia  eICU Interventions  Low dose kcl Bolus saline Repeat bmet    Intervention Category Minor Interventions: Electrolytes abnormality - evaluation and management  [name] 09/07/2013, 5:31 AM

## 2013-09-07 NOTE — Consult Note (Addendum)
WOC ostomy consult note Stoma type/location: LLQ, end colostomy Stomal assessment/size: oval shaped, slightly budded, but the mucosa is black today at the surface.  I cleaned the stoma with gauze to better assess the mucosa, it does appear that deeper in the stoma there is pink tissue. I have notified CCS NP Emina of this in order to have this monitored closely over the weekend.  Bedside staff to continue to monitor for changes Peristomal assessment: intact Output only bloody minimal output in pouch Ostomy pouching: 2pc. 2 3/4" in place from OR, will order 2 pc for now with barrier ring, as I expect this stoma will slough and may be more flush in the end. Will keep in 2pc to allow for better assessment of this stoma for now. Education provided: pt still on vent but awake and alert.  I have explained the creation of the stoma and the basics of care.  She is planned for extubation today.  I have explained the WOC role. She is appreciative and did visualize her abdomen during my assessment with me.   I will order supplies and place in the room for use and will provide educational materials to this patient early next week. She was able to let me know she has a daughter that lives at home with her.  I will be in touch with her to arrange ostomy education.  WOC will follow along with you for ostomy education and care. Gatha Mcnulty [place], [place] [phone]

## 2013-09-07 NOTE — Op Note (Signed)
[name] [address] [place], [address]   COLONOSCOPY PROCEDURE REPORT  PATIENT: [name], [name].  MR#: [id number] BIRTHDATE: [birth year] , 83  yrs. old GENDER: Female ENDOSCOPIST: Charlott Rakes, MD REFERRED OZ:[id number] team PROCEDURE DATE:  09/04/2013 PROCEDURE:   Colonoscopy with biopsy ASA CLASS:   Class III INDICATIONS:Abdominal pain and an abnormal CT. MEDICATIONS: Fentanyl 100 mcg IV and Versed 6 mg IV  DESCRIPTION OF PROCEDURE:   After the risks benefits and alternatives of the procedure were thoroughly explained, informed consent was obtained.  The [employer] Adult Colon [phone]  endoscope was introduced through the anus and advanced to the descending colon , limited by No adverse events experienced.   limited by Limited by an obstruction.   The quality of the prep was fair. . The instrument was then slowly withdrawn as the colon was fully examined.     FINDINGS:  Rectal exam unremarkable.  Pediatric colonoscope inserted into the colon and advanced to the descending colon where an obstructing circumferential fungating ulcerated malignant mass was noted. Lumen was narrowed to almost pinpoint size and colonoscope could not be advanced further. Biopsies were taken of the mass and 3 cc of ink spot was injected at the distal portion of the mass. A 1 cm sessile polyp was noted in the sigmoid colon that was left intact. Retroflexion revealed small internal hemorrhoids. COMPLICATIONS: None  IMPRESSION:     Obstructing colon mass - s/p biopsies and tattooing Colon polyp Internal hemorrhoids  RECOMMENDATIONS: Keep NPO; Needs surgery    ______________________________ eSignedCharlott Rakes, MD 09/04/2013 2:03 PM   CC:  PATIENT NAME:  [name]. MR#: [id number]

## 2013-09-07 NOTE — Progress Notes (Signed)
CRITICAL VALUE ALERT  Critical value received:  K+=2.6  Date of notification:  09/07/13  Time of notification:  0507  Critical value read back:yes  Nurse who received alert:  Berkley Harvey, RN  MD notified (1st page):  McQuaid (Elink)  Time of first page:  0507  MD notified (2nd page):  Time of second page:  Responding MD:    Time MD responded:  [phone]

## 2013-09-07 NOTE — Procedures (Signed)
Extubation Procedure Note  Patient Details:   Name: [name] DOB: 1930-04-24 MRN: [id number]   Airway Documentation:     Evaluation  O2 sats: stable throughout Complications: No apparent complications Patient did tolerate procedure well. Bilateral Breath Sounds: Clear;Diminished Suctioning: Airway Yes  Pt is stable on 4L [place] and tolerated well.   Devra Dopp D 09/07/2013, 11:02 AM

## 2013-09-07 NOTE — Progress Notes (Signed)
Pt's U/O dropped to less than 30cc/H.  Had been 65cc (2000) then 47cc (2200) then 17cc (0000).  Meredith Pel and MD made aware.  Will continue to assess.

## 2013-09-07 NOTE — Progress Notes (Signed)
Patient ID: [name], female   DOB: 30-Jul-1930, 79 y.o.   MRN: [id number]  General Surgery - [facility] - Progress Note  POD# 1  Subjective: Patient in ICU on vent.  Awake and alert and responsive.  No complaints.  Objective: Vital signs in last 24 hours: Temp:  [97.7 F (36.5 C)-99 F (37.2 C)] 98.8 F (37.1 C) (12/12 0738) Pulse Rate:  [59-93] 87 (12/12 0800) Resp:  [14-24] 14 (12/12 0800) BP: (130-182)/(34-87) 152/55 mmHg (12/12 0800) SpO2:  [94 %-100 %] 99 % (12/12 0800) FiO2 (%):  [30 %] 30 % (12/12 0800) Weight:  [154 lb 12.2 oz (70.2 kg)-156 lb 12 oz (71.1 kg)] 156 lb 12 oz (71.1 kg) (12/12 0600) Last BM Date: 09/04/13  Intake/Output from previous day: 12/11 0701 - 12/12 0700 In: 3850 [I.V.:2900; IV Piggyback:950] Out: 545 [Urine:420; Emesis/NG output:100; Blood:25]  Exam: HEENT - clear, not icteric Neck - soft Chest - coarse bilaterally Cor - RRR Abd - soft without distension; midline wound dry and intact; stoma with venous congestion, possible mucosal slough Neuro - grossly intact, no focal deficits  Lab Results:   Recent Labs  09/07/13 0357  WBC 10.7*  HGB 10.5*  HCT 31.6*  PLT 305     Recent Labs  09/07/13 0357  NA 139  K 2.6*  CL 104  CO2 24  GLUCOSE 198*  BUN 8  CREATININE 0.78  CALCIUM 7.3*    Studies/Results: Dg Chest Port 1 View  09/06/2013   CLINICAL DATA:  Endotracheal tube placement  EXAM: PORTABLE CHEST - 1 VIEW  COMPARISON:  09/02/2013  FINDINGS: Endotracheal tube is appropriately positioned. Nasogastric tube terminates below the level of the hemidiaphragms but is not included in the field of view. Hazy contour to the hemidiaphragms may indicate trace pleural effusions tracking posteriorly. Right lower lobe atelectasis or consolidation is noted with right hemidiaphragmatic elevation since the prior study.  IMPRESSION: Endotracheal tube appropriately positioned.  Right lower lobe atelectasis.  Early pneumonia  could appear similar.   Electronically Signed   By: Christiana Pellant M.D.   On: 09/06/2013 18:26    Assessment / Plan: 1.  Status post partial colectomy with colostomy  Surprised at appearance of ostomy this morning - will monitor closely  Await path  Leave NG in place, NPO, IVF 2.  Respiratory failure following general anesthesia  Weaning this AM  Discussed with Dr. Sung Amabile - hopefully will extubate today  Velora Heckler, MD, [facility] Office: [phone]  09/07/2013

## 2013-09-07 NOTE — Anesthesia Postprocedure Evaluation (Signed)
  Anesthesia Post-op Note  Patient: [name]  Procedure(s) Performed: Procedure(s): PARTIAL COLECTOMY WITH COLOSTOMY (N/A)  Patient Location: ICU  Anesthesia Type:General  Level of Consciousness: awake and alert   Airway and Oxygen Therapy: Patient remains intubated per anesthesia plan  Post-op Pain: none  Post-op Assessment: Post-op Vital signs reviewed, Patient's Cardiovascular Status Stable, Respiratory Function Stable, Patent Airway, No signs of Nausea or vomiting and Pain level controlled  Post-op Vital Signs: Reviewed and stable  Complications: No apparent anesthesia complications

## 2013-09-07 NOTE — Op Note (Signed)
[name], [name] NO.:  [id number]  MEDICAL RECORD NO.:  [id number]  LOCATION:  2M14C                        FACILITY:  [facility]  PHYSICIAN:  Velora Heckler, MD      DATE OF BIRTH:  [birth year]  DATE OF PROCEDURE:  09/06/2013                              OPERATIVE REPORT   PREOPERATIVE DIAGNOSIS:  Adenocarcinoma of the colon.  POSTOPERATIVE DIAGNOSIS:  Adenocarcinoma of the colon.  PROCEDURE:  Partial colectomy with end colostomy and long Hartmann's pouch.  SURGEON:  Velora Heckler, MD, FACS  ASSISTANT:  Ashok Norris, ANP-BC  ANESTHESIA:  General per Dr. Chaney Malling.  ESTIMATED BLOOD LOSS:  Minimal.  PREPARATION:  ChloraPrep.  COMPLICATIONS:  None.  INDICATIONS:  The patient is an 79 year old female who presented with intestinal obstruction.  Evaluation included colonoscopy which showed a near obstructing mass near the splenic flexure of the colon.  Biopsies confirmed adenocarcinoma.  The patient is now prepared and brought to the operating room for partial colectomy with end colostomy.  BODY OF REPORT:  Procedure was done in OR #1 at the [facility].  The patient was brought to the operating room, placed in supine position on the operating room table.  Following administration of general anesthesia, the patient was positioned and then prepped and draped in the usual strict aseptic fashion.  After ascertaining that an adequate level of anesthesia had been achieved, an upper midline abdominal incision was made with a #10 blade.  Dissection was carried to just below the level of the umbilicus.  Dissection was carried through the fascia and the peritoneal cavity was entered cautiously.  Adhesions of omentum to the midline incision below the level of the umbilicus were taken down with use of the electrocautery and sharp dissection.  The small bowel was inspected and a small serosal tear was closed with a 3-0 silk simple  suture.  Abdomen was then explored.  Liver was grossly normal.  Stomach was grossly normal.  Small bowel appeared grossly normal.  Palpation in the pelvis showed no abnormality.  In the omentum, there was a small 1 cm implant which was excised and submitted separately to Pathology for review.  Palpation of the entire colon showed the tumor to be located near the splenic flexure in the distal transverse colon.  A previous tattooing was noted.  Using the electrocautery, the descending colon was mobilized from its lateral peritoneal attachments.  Using the ligature, the hepatic flexure was mobilized taking care to avoid the pancreas and the spleen.  The greater omentum was elevated off the mid and distal transverse colon using the electrocautery.  The omentum was split with the ligature.  The gastrocolic ligament was divided with the ligature.  The remainder of the hepatic flexure was then mobilized.  A point in the mid transverse colon was selected and transected with a GIA stapler.  The mesoappendix was divided with the ligature.  Dissection was carried posterior to the tumor.  Mesentery was divided into the proximal descending colon.  The mid descending colon was then transected with a GIA stapler.  Specimen was removed and submitted to Pathology for review.  Good hemostasis was noted.  The abdomen was irrigated with warm saline.  Gowns and gloves and drapes and light handles were all changed at this point.  The transverse colon was then prepared for colostomy by mobilizing the mesentery away from the stapled end of the transverse colon.  The transverse colon easily reached to the left upper quadrant of the abdomen.  An ellipse of skin was excised from the left upper quadrant of the abdomen.  A plug of adipose tissue was excised with the electrocautery.  A cruciate incision was made in the anterior rectus sheath and the rectus muscle was split.  The posterior rectus sheath was also  incised in a cruciate fashion and the peritoneal cavity was entered.  The tract was dilated with 2 fingers.  A Babcock clamp was used to deliver the transverse colon through the abdominal wall.  The bowel was secured to the peritoneal surface with interrupted 2-0 silk sutures.  Omentum was used to cover the small bowel.  Midline incision was closed with interrupted #1 Novafil simple sutures.  Subcutaneous tissues were irrigated.  Skin was closed with stainless steel staples.  Colostomy was matured by excising the staple line and maturing the bowel edges to the skin edges with interrupted 3-0 Vicryl sutures.  Sterile dressings were applied to the midline incision.  An ostomy appliance was applied to the newly formed ostomy in the left upper quadrant.  The patient was awakened from anesthesia and brought to the recovery room.  The patient tolerated the procedure well.   Velora Heckler, MD, [facility] Office: [phone]    TMG/MEDQ  D:  09/06/2013  T:  09/07/2013  Job:  [id number]  cc:   Shirley Friar, MD

## 2013-09-07 NOTE — Progress Notes (Signed)
    Subjective:  The patient was seen this morning. She was still on the ventilator but awake and alert. She denied chest pain.  Objective:  Vital Signs in the last 24 hours: Temp:  [98.3 F (36.8 C)-99 F (37.2 C)] 98.3 F (36.8 C) (12/12 1127) Pulse Rate:  [68-93] 69 (12/12 1800) Resp:  [13-27] 20 (12/12 1800) BP: (130-174)/(32-87) 140/34 mmHg (12/12 1800) SpO2:  [94 %-100 %] 100 % (12/12 1800) FiO2 (%):  [30 %] 30 % (12/12 0800) Weight:  [156 lb 12 oz (71.1 kg)] 156 lb 12 oz (71.1 kg) (12/12 0600)  Intake/Output from previous day: 12/11 0701 - 12/12 0700 In: 3850 [I.V.:2900; IV Piggyback:950] Out: 545 [Urine:420; Emesis/NG output:100; Blood:25]  Physical Exam: Pt is alert elderly woman in NAD, on the ventilator HEENT: normal Neck: JVP - normal Lungs: CTA bilaterally CV: RRR without murmur or gallop Ext: no C/C/E, distal pulses intact and equal Skin: warm/dry no rash   Lab Results:  Recent Labs  09/07/13 0357  WBC 10.7*  HGB 10.5*  PLT 305    Recent Labs  09/07/13 0357 09/07/13 0945  NA 139 137  K 2.6* 3.9  CL 104 106  CO2 24 22  GLUCOSE 198* 146*  BUN 8 8  CREATININE 0.78 0.72   No results found for this basename: TROPONINI, CK, MB,  in the last 72 hours  Cardiac Studies: 2-D echocardiogram: Study Conclusions  - Left ventricle: Inferobasal hypokinesis The cavity size was normal. Wall thickness was increased in a pattern of mild LVH. Systolic function was normal. The estimated ejection fraction was in the range of 55% to 60%. Wall motion was normal; there were no regional wall motion abnormalities. - Aortic valve: Trivial regurgitation. - Mitral valve: Calcified annulus. - Atrial septum: There was increased thickness of the septum, consistent with lipomatous hypertrophy. No defect or patent foramen ovale was identified.  Tele: Sinus rhythm  Assessment/Plan:  1. Paroxysmal atrial fibrillation 2. Hypertension 3. Chronic diastolic heart  failure  Overall the patient appears stable from a cardiac perspective. I do not think she has any active cardiac issues at the current time. She will be at continued risk for postoperative atrial fibrillation and should be monitored for this. As per the consult note of Dr. Jens Som, long-term anticoagulation would be appropriate after she recovers from surgery.   Tonny Bollman, M.D. 09/07/2013, 6:51 PM

## 2013-09-08 ENCOUNTER — Encounter (HOSPITAL_COMMUNITY)
Admission: EM | Disposition: A | Discharge status: Home / Self Care | Payer: Self-pay | Source: Home / Self Care | Attending: Family Medicine

## 2013-09-08 ENCOUNTER — Encounter (HOSPITAL_COMMUNITY): Payer: Self-pay | Admitting: Anesthesiology

## 2013-09-08 ENCOUNTER — Inpatient Hospital Stay (HOSPITAL_COMMUNITY): Payer: Medicare Other | Admitting: Certified Registered Nurse Anesthetist

## 2013-09-08 ENCOUNTER — Encounter (HOSPITAL_COMMUNITY): Payer: Medicare Other | Admitting: Certified Registered Nurse Anesthetist

## 2013-09-08 DIAGNOSIS — IMO0002 Reserved for concepts with insufficient information to code with codable children: Secondary | ICD-10-CM

## 2013-09-08 HISTORY — PX: COLOSTOMY REVISION: SHX5232

## 2013-09-08 LAB — GLUCOSE, CAPILLARY
Glucose-Capillary: 117 mg/dL — ABNORMAL HIGH (ref 70–99)
Glucose-Capillary: 117 mg/dL — ABNORMAL HIGH (ref 70–99)
Glucose-Capillary: 123 mg/dL — ABNORMAL HIGH (ref 70–99)
Glucose-Capillary: 96 mg/dL (ref 70–99)

## 2013-09-08 LAB — CBC
HCT: 30.2 % — ABNORMAL LOW (ref 36.0–46.0)
MCH: 27.7 pg (ref 26.0–34.0)
MCV: 86.3 fL (ref 78.0–100.0)
Platelets: 314 10*3/uL (ref 150–400)
RBC: 3.5 MIL/uL — ABNORMAL LOW (ref 3.87–5.11)
RDW: 16.2 % — ABNORMAL HIGH (ref 11.5–15.5)

## 2013-09-08 LAB — BASIC METABOLIC PANEL
BUN: 7 mg/dL (ref 6–23)
CO2: 23 mEq/L (ref 19–32)
Calcium: 7.3 mg/dL — ABNORMAL LOW (ref 8.4–10.5)
Chloride: 109 mEq/L (ref 96–112)
Creatinine, Ser: 0.74 mg/dL (ref 0.50–1.10)
Glucose, Bld: 131 mg/dL — ABNORMAL HIGH (ref 70–99)
Potassium: 3.6 mEq/L (ref 3.5–5.1)

## 2013-09-08 SURGERY — REVISION, COLOSTOMY
Anesthesia: General | Site: Abdomen

## 2013-09-08 MED ORDER — SUCCINYLCHOLINE CHLORIDE 20 MG/ML IJ SOLN
INTRAMUSCULAR | Status: DC | PRN
  Administered 2013-09-08: 80 mg via INTRAVENOUS

## 2013-09-08 MED ORDER — DEXTROSE 5 % IV SOLN
2.0000 g | INTRAVENOUS | Status: DC | PRN
  Administered 2013-09-08: 2 g via INTRAVENOUS

## 2013-09-08 MED ORDER — LACTATED RINGERS IV SOLN
INTRAVENOUS | Status: DC
  Administered 2013-09-08: 12:00:00 via INTRAVENOUS

## 2013-09-08 MED ORDER — DEXTROSE 5 % IV SOLN
INTRAVENOUS | Status: AC
  Filled 2013-09-08 (×2): qty 1

## 2013-09-08 MED ORDER — HYDRALAZINE HCL 20 MG/ML IJ SOLN: 5.0000 mg | INTRAMUSCULAR | Status: DC | PRN

## 2013-09-08 MED ORDER — METOCLOPRAMIDE HCL 5 MG/ML IJ SOLN: 10.0000 mg | Freq: Once | INTRAMUSCULAR | Status: DC | PRN

## 2013-09-08 MED ORDER — PROPOFOL 10 MG/ML IV BOLUS
INTRAVENOUS | Status: DC | PRN
  Administered 2013-09-08: 150 mg via INTRAVENOUS

## 2013-09-08 MED ORDER — HYDROMORPHONE HCL PF 1 MG/ML IJ SOLN: 0.2500 mg | INTRAMUSCULAR | Status: DC | PRN

## 2013-09-08 MED ORDER — LACTATED RINGERS IV SOLN
INTRAVENOUS | Status: DC | PRN
  Administered 2013-09-08: 12:00:00 via INTRAVENOUS

## 2013-09-08 MED ORDER — LIDOCAINE HCL (CARDIAC) 20 MG/ML IV SOLN
INTRAVENOUS | Status: DC | PRN
  Administered 2013-09-08: 60 mg via INTRAVENOUS

## 2013-09-08 MED ORDER — OXYCODONE HCL 5 MG PO TABS: 5.0000 mg | ORAL_TABLET | Freq: Once | ORAL | Status: DC | PRN

## 2013-09-08 MED ORDER — OXYCODONE HCL 5 MG/5ML PO SOLN: 5.0000 mg | Freq: Once | ORAL | Status: DC | PRN

## 2013-09-08 MED ORDER — FENTANYL CITRATE 0.05 MG/ML IJ SOLN
INTRAMUSCULAR | Status: DC | PRN
  Administered 2013-09-08 (×2): 50 ug via INTRAVENOUS

## 2013-09-08 MED ORDER — 0.9 % SODIUM CHLORIDE (POUR BTL) OPTIME
TOPICAL | Status: DC | PRN
  Administered 2013-09-08: 100 mL

## 2013-09-08 MED ORDER — PHENYLEPHRINE HCL 10 MG/ML IJ SOLN
10.0000 mg | INTRAVENOUS | Status: DC | PRN
  Administered 2013-09-08: 40 ug/min via INTRAVENOUS

## 2013-09-08 SURGICAL SUPPLY — 54 items
BLADE SURG ROTATE 9660 (MISCELLANEOUS) IMPLANT
CANISTER SUCTION 2500CC (MISCELLANEOUS) ×2 IMPLANT
COVER MAYO STAND STRL (DRAPES) ×2 IMPLANT
COVER SURGICAL LIGHT HANDLE (MISCELLANEOUS) ×2 IMPLANT
DRAPE LAPAROSCOPIC ABDOMINAL (DRAPES) ×2 IMPLANT
DRAPE PROXIMA HALF (DRAPES) ×2 IMPLANT
DRAPE UTILITY 15X26 W/TAPE STR (DRAPE) ×7 IMPLANT
DRAPE WARM FLUID 44X44 (DRAPE) ×1 IMPLANT
DRSG OPSITE POSTOP 4X10 (GAUZE/BANDAGES/DRESSINGS) ×1 IMPLANT
DRSG OPSITE POSTOP 4X8 (GAUZE/BANDAGES/DRESSINGS) IMPLANT
ELECT BLADE 6.5 EXT (BLADE) ×2 IMPLANT
ELECT CAUTERY BLADE 6.4 (BLADE) ×3 IMPLANT
ELECT REM PT RETURN 9FT ADLT (ELECTROSURGICAL) ×2
ELECTRODE REM PT RTRN 9FT ADLT (ELECTROSURGICAL) ×1 IMPLANT
GLOVE BIOGEL PI IND STRL 8 (GLOVE) ×2 IMPLANT
GLOVE BIOGEL PI INDICATOR 8 (GLOVE) ×1
GLOVE ECLIPSE 7.5 STRL STRAW (GLOVE) ×4 IMPLANT
GOWN STRL NON-REIN LRG LVL3 (GOWN DISPOSABLE) ×9 IMPLANT
KIT BASIN OR (CUSTOM PROCEDURE TRAY) ×2 IMPLANT
KIT OSTOMY DRAINABLE 2.75 STR (WOUND CARE) ×1 IMPLANT
KIT ROOM TURNOVER OR (KITS) ×2 IMPLANT
LEGGING LITHOTOMY PAIR STRL (DRAPES) IMPLANT
LIGASURE IMPACT 36 18CM CVD LR (INSTRUMENTS) IMPLANT
NS IRRIG 1000ML POUR BTL (IV SOLUTION) ×3 IMPLANT
PACK GENERAL/GYN (CUSTOM PROCEDURE TRAY) ×2 IMPLANT
PAD ARMBOARD 7.5X6 YLW CONV (MISCELLANEOUS) ×4 IMPLANT
PENCIL BUTTON HOLSTER BLD 10FT (ELECTRODE) ×1 IMPLANT
SPECIMEN JAR MEDIUM (MISCELLANEOUS) IMPLANT
SPONGE LAP 18X18 X RAY DECT (DISPOSABLE) ×1 IMPLANT
STAPLER VISISTAT 35W (STAPLE) ×1 IMPLANT
SUCTION POOLE TIP (SUCTIONS) IMPLANT
SUT NOVA NAB DX-16 0-1 5-0 T12 (SUTURE) IMPLANT
SUT PDS AB 1 TP1 54 (SUTURE) IMPLANT
SUT PDS AB 1 TP1 96 (SUTURE) IMPLANT
SUT SILK 2 0 SH (SUTURE) ×1 IMPLANT
SUT SILK 2 0 SH CR/8 (SUTURE) ×1 IMPLANT
SUT SILK 2 0 TIES 10X30 (SUTURE) ×1 IMPLANT
SUT SILK 3 0 SH CR/8 (SUTURE) ×1 IMPLANT
SUT SILK 3 0 TIES 10X30 (SUTURE) ×1 IMPLANT
SUT VIC AB 1 CT1 18XCR BRD 8 (SUTURE) IMPLANT
SUT VIC AB 1 CT1 8-18 (SUTURE)
SUT VIC AB 2-0 CT1 27 (SUTURE)
SUT VIC AB 2-0 CT1 TAPERPNT 27 (SUTURE) IMPLANT
SUT VIC AB 3-0 54X BRD REEL (SUTURE) IMPLANT
SUT VIC AB 3-0 BRD 54 (SUTURE)
SUT VIC AB 3-0 SH 27 (SUTURE)
SUT VIC AB 3-0 SH 27X BRD (SUTURE) IMPLANT
SUT VIC AB 3-0 SH 8-18 (SUTURE) ×3 IMPLANT
SYR BULB IRRIGATION 50ML (SYRINGE) ×1 IMPLANT
TOWEL OR 17X26 10 PK STRL BLUE (TOWEL DISPOSABLE) ×3 IMPLANT
TRAY FOLEY CATH 14FRSI W/METER (CATHETERS) IMPLANT
TUBE CONNECTING 12X1/4 (SUCTIONS) ×1 IMPLANT
WATER STERILE IRR 1000ML POUR (IV SOLUTION) ×2 IMPLANT
YANKAUER SUCT BULB TIP NO VENT (SUCTIONS) IMPLANT

## 2013-09-08 NOTE — Anesthesia Postprocedure Evaluation (Signed)
Anesthesia Post Note  Patient: [name]  Procedure(s) Performed: Procedure(s) (LRB): COLOSTOMY REVISION (N/A)  Anesthesia type: General  Patient location: PACU  Post pain: Pain level controlled  Post assessment: Patient's Cardiovascular Status Stable  Last Vitals:  Filed Vitals:   09/08/13 1413  BP: 174/67  Pulse: 88  Temp: 36.9 C  Resp: 20    Post vital signs: Reviewed and stable  Level of consciousness: alert  Complications: No apparent anesthesia complications

## 2013-09-08 NOTE — Op Note (Signed)
OPERATIVE REPORT  DATE OF OPERATION: 09/02/2013 - 09/08/2013  PATIENT:  [name]  79 y.o. female  PRE-OPERATIVE DIAGNOSIS:  Colostomy ischemia  POST-OPERATIVE DIAGNOSIS:  Colostomy ischemia  PROCEDURE:  Procedure(s): COLOSTOMY REVISION  SURGEON:  Surgeon(s): Cherylynn Ridges, MD  ASSISTANT: Reibock, NP-C  ANESTHESIA:   general  EBL: <10 ml  BLOOD ADMINISTERED: none  DRAINS: Nasogastric Tube   SPECIMEN:  No Specimen  COUNTS CORRECT:  YES  PROCEDURE DETAILS: The patient was taken to the operating room and placed on the table in the supine position. After an adequate general endotracheal anesthetic was administered we prepped her abdomen primarily with ChloraPrep isolating the midline wound away from the necrotic stoma on the left upper quadrant.  After proper time out was performed identifying the patient and procedure be performed we used #15 blade to detach the necrotic stoma from the skin at each without evidence cut the skin around the stoma itself. It was noted very quickly that only the distal centimeter to 2 cm of mucosal was necrotic. We were able to easily mobilized several centimeters of distal colon.  After we mobilized the distal colon we transected approximately 3-4 cm of the distal colon then reattached it circumferentially using interrupted 3-0 Vicryl stitches to the previous colostomy site.. All counts were correct and a stomal appliance was applied to the site. A sterile dressing was reapplied to the protected midline wound.  PATIENT DISPOSITION:  PACU - hemodynamically stable.   Cherylynn Ridges 12/13/20141:42 PM

## 2013-09-08 NOTE — Transfer of Care (Signed)
Immediate Anesthesia Transfer of Care Note  Patient: [name]  Procedure(s) Performed: Procedure(s): COLOSTOMY REVISION (N/A)  Patient Location: PACU  Anesthesia Type:General  Level of Consciousness: awake  Airway & Oxygen Therapy: Patient Spontanous Breathing and Patient connected to face mask oxygen  Post-op Assessment: Report given to PACU RN and Post -op Vital signs reviewed and stable  Post vital signs: Reviewed and stable  Complications: No apparent anesthesia complications

## 2013-09-08 NOTE — Anesthesia Preprocedure Evaluation (Addendum)
Anesthesia Evaluation  Patient identified by MRN, date of birth, ID band Patient awake    Reviewed: Allergy & Precautions, H&P , NPO status , Patient's Chart, lab work & pertinent test results, reviewed documented beta blocker date and time   Airway Mallampati: II TM Distance: >3 FB Neck ROM: full    Dental  (+) Edentulous Upper and Edentulous Lower   Pulmonary COPD COPD inhaler, Current Smoker,  breath sounds clear to auscultation        Cardiovascular hypertension, On Medications and On Home Beta Blockers +CHF + dysrhythmias Atrial Fibrillation Rhythm:regular     Neuro/Psych CVA negative psych ROS   GI/Hepatic negative GI ROS, Neg liver ROS,   Endo/Other  diabetes, Insulin Dependent  Renal/GU negative Renal ROS  negative genitourinary   Musculoskeletal   Abdominal   Peds  Hematology negative hematology ROS (+)   Anesthesia Other Findings See surgeon's H&P   Reproductive/Obstetrics negative OB ROS                        Anesthesia Physical Anesthesia Plan  ASA: III and emergent  Anesthesia Plan: General   Post-op Pain Management:    Induction: Intravenous, Rapid sequence and Cricoid pressure planned  Airway Management Planned: Oral ETT  Additional Equipment:   Intra-op Plan:   Post-operative Plan: Extubation in OR  Informed Consent: I have reviewed the patients History and Physical, chart, labs and discussed the procedure including the risks, benefits and alternatives for the proposed anesthesia with the patient or authorized representative who has indicated his/her understanding and acceptance.   Dental Advisory Given  Plan Discussed with: CRNA and Surgeon  Anesthesia Plan Comments:        Anesthesia Quick Evaluation

## 2013-09-08 NOTE — Progress Notes (Signed)
I have seen and examined this patient. I have discussed with Dr Idelle Leech.  I agree with their findings and plans as documented in their progress note.

## 2013-09-08 NOTE — Progress Notes (Signed)
Family Medicine Teaching Service Daily Progress Note Intern Pager: [phone]  Patient name: [name] Medical record number: [id number] Date of birth: [birth year] Age: 79 y.o. Gender: female  Primary Care Provider: Kaleen Mask, MD Consultants: General Surgery, GI Code Status: Full  Pt Overview and Major Events to Date:  12/7 - CT Abd showing distended R-colon, ileus / partial SBO, "apple-core lesion" suspicious for malignancy 12/8 - c/s GI --> plan for colonoscopy 12/9 12/9 - scheduled colonoscopy, Gen surg cont to follow (awaiting results - aware of potential need for surgery) 12/10 - Colonoscopy (obstructing descending colon mass), tentative surgery 12/11 for resection pending cardiac clearance / medical stability 12/11 - ECHO normal EF, partial colon resection w/ colostomy, ICU o/n while on extubation 12/12 - POD #1. Extubated in ICU, plan to transfer to floor  Assessment and Plan:  [name] is a 79 y.o. female presenting with abdominal pain, nausea and vomiting consistent with ileus and with possible bowel obstruction. PMH is significant for DM type 2, left sided CVA, atrial fibrillation, Chronic HF w/ preserved EF, and HTN.  # Abdominal Pain/Distention, secondary to Obstructing Colon Adenocarcinoma - c/s General Surgery - greatly appreciate all recs     - s/p partial colectomy with colostomy (adenocarcinoma removed).  Ischemia of colostomy noted on exam today.          Patient in need of revision.      - expect to remain in hospital for about 1 week recovery, management of ileus, and advance diet     - plan for outpatient follow-up arrangements with [facility] (in approx 3 weeks     - [ ]  f/u omental mass pathology; CEA - c/s GI Deboraha Sprang) - greatly appreciate all recs      - 12/9 colonoscopy --> obstructing descending colon mass - identified as adenocarcinoma per biopsy  # Chronic Constipation - plan to start regular bowel regimen when tolerating PO   #  Urinary Tract Infection, uncomplicated - Resolved U/A showing + leukocytes and bacteria; previous UTI in July 214 was E. Coli sensitive to CTX. - completed CTX 3 Day course - Urine culture (E. Coli >100,000CFU, sensitive to CTX, Nitro, tobramycin, Resistant to Cefazolin) - Patient to follow up with Urology on discharge  # Atrial Fibrillation - Patient denies known history and not on anticoagulation (CHADSVASC score 9); EKG showed A-fib on admission and present on multiple EKG's in 2013  - History of GI bleeding in 2011, so no anticoagulation at this time (HAS Bled Score 5)  - Continue to monitor on telemetry - currently HR stable - low dose BB to ensure rate control  # Chronic HF, with preserved EF - Grade 1 diastolic dysfunction per Echo 4742 - ECHO normal EF (55-60%), mild LVH - Consider adding ACEI on D/C  # COPD - Hx of taking Spiriva and Albuterol, but currently taking no meds - Use albuterol inhaler PRN  # Tobacco Abuse - current daily smoke, pack years > 50  - Nicotine patch PRN  # Diabetes Mellitus - Hgb A1c 7.2 (09/03/13) - CBG's stable.  Will change to Q6H.  - SSI sesnitive (has not received any)  # Cerebrovascular Disease - s/p left sided CVA 2008; History of taking Aggrenox but stopped in 2011 after upper GI bleed from multiple bleeding ulcers and started Plavix, but not currently taking any meds for this problem  - Given history of stroke, pt likely needs to restart Plavix   # HLD - Previously  taking simvastatin 40 mg, but no on any medications - Pt needs to be on high potency statin given history of stroke  - Start when ileus resolved  # Memory Deficits / Functional Decline - Pt admitted memory loss, not oriented to time; likely component of vascular dementia  - Will continue to follow closely. - MMSE during admission  FEN/GI: NPO with NGT, D5-1/2 NS  Prophylaxis: Heparin SQ TID  Disposition: Plan to transfer to floor today. Expect about multiple day to 1 week  recovery from surgery, pending clinical course improvement will continue to work on future placement plans with CSW, expect discharge to SNF - CSW following per discharge plans  Subjective: No acute overnight events. Feeling well this am.  Pain well controlled.   Objective: Temp:  [97.9 F (36.6 C)-98.3 F (36.8 C)] 97.9 F (36.6 C) (12/13 0747) Pulse Rate:  [66-93] 79 (12/13 0700) Resp:  [13-28] 28 (12/13 0700) BP: (133-171)/(32-54) 145/47 mmHg (12/13 0700) SpO2:  [98 %-100 %] 98 % (12/13 0700) Weight:  [158 lb 15.2 oz (72.1 kg)] 158 lb 15.2 oz (72.1 kg) (12/13 0400) Physical Exam: General - laying in bed, pleasant, interactive.  NAD.  Heart - Tachy, S1/S2, no murmurs heard Lungs - CTAB Abd - soft, non-distended, midline surgical wound appears dry and intact.  Colostomy ischemia noted.  Neuro - No focal deficits. Awake, alert.   Laboratory:  Recent Labs Lab 09/03/13 0638 09/07/13 0357 09/08/13 0440  WBC 10.3 10.7* 14.0*  HGB 12.0 10.5* 9.7*  HCT 36.7 31.6* 30.2*  PLT 398 305 314    Recent Labs Lab 09/02/13 1220  09/03/13 0638 09/07/13 0357 09/07/13 0945 09/08/13 0440  NA  --   < > 134* 139 137 141  K  --   < > 4.4 2.6* 3.9 3.6  CL  --   < > 102 104 106 109  CO2  --   < > 22 24 22 23   BUN  --   < > 20 8 8 7   CREATININE  --   < > 1.03 0.78 0.72 0.74  CALCIUM  --   < > 8.6 7.3* 7.2* 7.3*  PROT 7.6  --  6.7  --   --   --   BILITOT 0.3  --  0.3  --   --   --   ALKPHOS 67  --  58  --   --   --   ALT <5  --  5  --   --   --   AST 9  --  8  --   --   --   GLUCOSE  --   < > 119* 198* 146* 131*  < > = values in this interval not displayed.  HgbA1c 7.2  ProBNP 1298.0 (prior baseline 200s)  PT 13.5 / INR 1.05, APTT 28  12/7 UA Large leuks, neg nitrite, mod hgb, WBC TNTC, rare squam, many bacteria - suggestive of UTI  12/7 Urine Culture (E. Coli >100,000 CFU, sensitive to CTX)  Imaging/Diagnostic Tests:  12/7 Acute Abd / Chest Xray IMPRESSION:  No acute  cardiopulmonary findings.  Air-filled small bowel loops and colon with mild distention possibly  due to gastroenteritis or ileus. No free air  12/7 CT Abd / Pelvis IMPRESSION:  1. There is distension of the right colon and transverse colon with  air-fluid level. Distended distal small bowel loops with air-fluid  level suspicious for ileus or partial small bowel obstruction. There  is a  apple core thickening of the colonic wall in the splenic  flexure of the colon axial image 31 and 32 highly suspicious for  colonic malignancy. Further correlation with colonoscopy is  recommended.  2. No hydronephrosis or hydroureter.  12/9 Colonoscopy Obstructing descending colon mass. Biopsy positive for Adenocarcinoma.  12/11 2D ECHO - Left ventricle: Inferobasal hypokinesis The cavity size was normal. Wall thickness was increased in a pattern of mild LVH. Systolic function was normal. The estimated ejection fraction was in the range of 55% to 60%. Wall motion was normal; there were no regional wall motion abnormalities. - Aortic valve: Trivial regurgitation. - Mitral valve: Calcified annulus. - Atrial septum: There was increased thickness of the septum, consistent with lipomatous hypertrophy. No defect or patent foramen ovale was identified.  12/11 Surgical Pathology (Omental Mass)   Tommie Sams, DO 09/08/2013, 8:21 AM PGY-2 [facility] Family Medicine FPTS Intern pager: [phone], text pages welcome

## 2013-09-08 NOTE — Progress Notes (Signed)
CC[facility]/[name]  Subjective: Patient is stable.  Colostomy is ischemic and will need to be revised.  Has NGT in place.  Has not detached.  Objective: Vital signs in last 24 hours: Temp:  [97.9 F (36.6 C)-98.3 F (36.8 C)] 97.9 F (36.6 C) (12/13 0747) Pulse Rate:  [66-93] 89 (12/13 0800) Resp:  [13-28] 21 (12/13 0800) BP: (133-171)/(32-54) 166/47 mmHg (12/13 0800) SpO2:  [98 %-100 %] 98 % (12/13 0800) Weight:  [72.1 kg (158 lb 15.2 oz)] 72.1 kg (158 lb 15.2 oz) (12/13 0400) Last BM Date: 09/02/13  Intake/Output from previous day: 12/12 0701 - 12/13 0700 In: 2156.3 [I.V.:1526.3; NG/GT:30; IV Piggyback:600] Out: 412 [Urine:362; Emesis/NG output:50] Intake/Output this shift: Total I/O In: 75 [I.V.:75] Out: 140 [Urine:140]  General: No distress.  Very pleasant.    Lungs: Clear  Abd: Soft, mild tenderness.  Ischemic/infarcted LUQ colostomy  Extremities: No changes  Neuro: Intact  Lab Results:  @LABLAST2 (wbc:2,hgb:2,hct:2,plt:2) BMET  Recent Labs  09/07/13 0945 09/08/13 0440  NA 137 141  K 3.9 3.6  CL 106 109  CO2 22 23  GLUCOSE 146* 131*  BUN 8 7  CREATININE 0.72 0.74  CALCIUM 7.2* 7.3*   PT/INR No results found for this basename: LABPROT, INR,  in the last 72 hours ABG  Recent Labs  09/06/13 2018  PHART 7.448  HCO3 25.8*    Studies/Results: Dg Chest Port 1 View  09/07/2013   CLINICAL DATA:  Recent abdominal surgery, intubation, atelectasis, history COPD, CHF, hypertension, stroke, diabetes, smoking  EXAM: PORTABLE CHEST - 1 VIEW  COMPARISON:  Portable exam 0548 hr compared to 09/06/2013  FINDINGS: Endotracheal tube and nasogastric tube unchanged.  Enlargement of cardiac silhouette.  Atherosclerotic calcification aorta.  Pulmonary vascularity normal.  Mild bibasilar density could represent atelectasis or potentially small basilar effusions.  No definite acute infiltrate, pneumothorax, or acute osseous findings.  IMPRESSION:  Question minimal bibasilar atelectasis or small effusions.  Enlargement of cardiac silhouette.   Electronically Signed   By: Ulyses Southward M.D.   On: 09/07/2013 10:25   Dg Chest Port 1 View  09/06/2013   CLINICAL DATA:  Endotracheal tube placement  EXAM: PORTABLE CHEST - 1 VIEW  COMPARISON:  09/02/2013  FINDINGS: Endotracheal tube is appropriately positioned. Nasogastric tube terminates below the level of the hemidiaphragms but is not included in the field of view. Hazy contour to the hemidiaphragms may indicate trace pleural effusions tracking posteriorly. Right lower lobe atelectasis or consolidation is noted with right hemidiaphragmatic elevation since the prior study.  IMPRESSION: Endotracheal tube appropriately positioned.  Right lower lobe atelectasis.  Early pneumonia could appear similar.   Electronically Signed   By: Christiana Pellant M.D.   On: 09/06/2013 18:26    Anti-infectives: Anti-infectives   Start     Dose/Rate Route Frequency Ordered Stop   09/06/13 0600  ertapenem (INVANZ) 1 g in sodium chloride 0.9 % 50 mL IVPB     1 g 100 mL/hr over 30 Minutes Intravenous On call to O.R. 09/05/13 1417 09/06/13 1242   09/03/13 1400  cefTRIAXone (ROCEPHIN) 1 g in dextrose 5 % 50 mL IVPB  Status:  Discontinued     1 g 100 mL/hr over 30 Minutes Intravenous Every 24 hours 09/02/13 1916 09/06/13 1836   09/02/13 1345  cefTRIAXone (ROCEPHIN) 1 g in dextrose 5 % 50 mL IVPB     1 g 100 mL/hr over 30 Minutes Intravenous  Once 09/02/13 1335 09/02/13 1643  Assessment/Plan: s/p Procedure(s): PARTIAL COLECTOMY WITH COLOSTOMY Ischemic colostomy, will need revision.  Will look at schedule for today, tomorrow or Monday.  LOS: 6 days   Marta Lamas. Gae Bon, MD, FACS [phone] [facility] Surgery 09/08/2013

## 2013-09-08 NOTE — Anesthesia Procedure Notes (Signed)
Procedure Name: Intubation Date/Time: 09/08/2013 12:34 PM Performed by: Alanda Amass A Pre-anesthesia Checklist: Patient identified, Timeout performed, Emergency Drugs available, Patient being monitored and Suction available Patient Re-evaluated:Patient Re-evaluated prior to inductionOxygen Delivery Method: Circle system utilized Preoxygenation: Pre-oxygenation with 100% oxygen Intubation Type: IV induction, Rapid sequence and Cricoid Pressure applied Laryngoscope Size: Mac and 3 Grade View: Grade I Tube type: Oral Tube size: 7.5 mm Number of attempts: 1 Airway Equipment and Method: Stylet Secured at: 21 cm Tube secured with: Tape Dental Injury: Teeth and Oropharynx as per pre-operative assessment

## 2013-09-08 NOTE — Progress Notes (Signed)
I discussed with Dr Cook.  I agree with their plans documented in their progress note for today.  

## 2013-09-08 NOTE — Preoperative (Addendum)
Beta Blockers   Reason not to administer Beta Blockers:Not Applicable. Received at 0600, 09/08/2013

## 2013-09-09 DIAGNOSIS — C189 Malignant neoplasm of colon, unspecified: Secondary | ICD-10-CM | POA: Diagnosis present

## 2013-09-09 LAB — GLUCOSE, CAPILLARY
Glucose-Capillary: 139 mg/dL — ABNORMAL HIGH (ref 70–99)
Glucose-Capillary: 117 mg/dL — ABNORMAL HIGH (ref 70–99)
Glucose-Capillary: 146 mg/dL — ABNORMAL HIGH (ref 70–99)

## 2013-09-09 MED ORDER — MORPHINE SULFATE 2 MG/ML IJ SOLN
2.0000 mg | INTRAMUSCULAR | Status: DC | PRN
  Administered 2013-09-10 – 2013-09-13 (×9): 2 mg via INTRAVENOUS
  Filled 2013-09-09 (×9): qty 1

## 2013-09-09 MED ORDER — MORPHINE SULFATE 2 MG/ML IJ SOLN
2.0000 mg | INTRAMUSCULAR | Status: DC | PRN
  Administered 2013-09-09 (×3): 2 mg via INTRAVENOUS
  Filled 2013-09-09 (×3): qty 1

## 2013-09-09 NOTE — Progress Notes (Signed)
Family Medicine Teaching Service Daily Progress Note Intern Pager: [phone]  Patient name: [name] Medical record number: [id number] Date of birth: 03/14/1930 Age: 79 y.o. Gender: female  Primary Care Provider: Kaleen Mask, MD Consultants: General Surgery, GI Code Status: Full  Pt Overview and Major Events to Date:  12/7 - CT Abd showing distended R-colon, ileus / partial SBO, "apple-core lesion" suspicious for malignancy 12/8 - c/s GI --> plan for colonoscopy 12/9 12/9 - scheduled colonoscopy, Gen surg cont to follow (awaiting results - aware of potential need for surgery) 12/10 - Colonoscopy (obstructing descending colon mass), tentative surgery 12/11 for resection pending cardiac clearance / medical stability 12/11 - ECHO normal EF, partial colon resection w/ colostomy, ICU o/n while on extubation 12/12 - POD #1. Extubated in ICU, plan to transfer to floor  Assessment and Plan:  [name] is a 79 y.o. female presenting with abdominal pain, nausea and vomiting consistent with ileus and with possible bowel obstruction. PMH is significant for DM type 2, left sided CVA, atrial fibrillation, Chronic HF w/ preserved EF, and HTN.  # Abdominal Pain/Distention, secondary to Obstructing Colon Adenocarcinoma - c/s General Surgery - greatly appreciate all recs     - POD# 2          - s/p partial colectomy with colostomy (adenocarcinoma removed).          - Required revision colostomy (OR 12/13) due to ischemia. Today ostomy stump appears viable          - clamp NGT, plan to DC in 6 hours if no n/v, advance diet to sips clear liquids today     - WOC consult for ostomy care     - expect to remain in hospital for about 1 week recovery, management of ileus, and advance diet     - plan for outpatient follow-up arrangements with [facility] (in approx 3 weeks     - [ ]  f/u omental mass pathology     - CEA 1.4 (normal range) - c/s GI Deboraha Sprang) - greatly appreciate all  recs      - 12/9 colonoscopy --> obstructing descending colon mass - identified as adenocarcinoma per biopsy - Pain Management:    - Morphine IV 2mg  q 3 hr PRN moderate pain (received at 0939)    - Fentanyl 25-43mcg q 2 hr PRN severe pain (received Fentanyl yesterday --> Morphine PO 60mg  equiv)  # Chronic Constipation - plan to start regular bowel regimen when tolerating PO   # Urinary Tract Infection, uncomplicated - Resolved U/A showing + leukocytes and bacteria; previous UTI in July 214 was E. Coli sensitive to CTX. - completed CTX 3 Day course - Urine culture (E. Coli >100,000CFU, sensitive to CTX, Nitro, tobramycin, Resistant to Cefazolin) - Patient to follow up with Urology on discharge  # Paroxysmal Atrial Fibrillation Patient denies known history and not on anticoagulation (CHADSVASC score 9); EKG showed A-fib on admission and present on multiple EKG's in 2013. - History of GI bleeding in 2011, so no anticoagulation at this time (HAS Bled Score 5)  - Continue to monitor on telemetry, reviewed rhythm per telemetry (NSR, with regular p waves, several PACs, PVCs with some irregularity, but no documented AFib per tele. - currently HR stable - low dose BB to ensure rate control, consider PO when tolerates  # Chronic HF, with preserved EF - Grade 1 diastolic dysfunction per Echo 1478 - ECHO normal EF (55-60%), mild LVH - Consider  adding ACEI on D/C  # COPD - Hx of taking Spiriva and Albuterol, but currently taking no meds - Use albuterol inhaler PRN - on 1L O2 via [place], monitor and titrate down as tolerated  # Tobacco Abuse - current daily smoke, pack years > 50  - Nicotine patch PRN  # Diabetes Mellitus - Hgb A1c 7.2 (09/03/13) - CBG's stable.  Will change to Q6H.  - SSI sesnitive (has not received any)  # Cerebrovascular Disease s/p left sided CVA 2008; History of taking Aggrenox but stopped in 2011 after upper GI bleed from multiple bleeding ulcers and started Plavix,  but not currently taking any meds for this problem  - Given history of stroke, pt likely needs to restart Plavix   # HLD - Previously taking simvastatin 40 mg, but no on any medications - Pt needs to be on high potency statin given history of stroke  - Start when ileus resolved  # Memory Deficits / Functional Decline - Pt admitted memory loss, not oriented to time; likely component of vascular dementia  - Will continue to follow closely. - MMSE during admission  FEN/GI: NPO with NGT, D5-1/2 NS  Prophylaxis: Heparin SQ TID  Disposition:  Patient was transferred to regular floor yesterday. Expect about multiple day to 1 week recovery from surgery, pending clinical course improvement will continue to work on future placement plans with CSW, expect discharge to SNF - CSW following per discharge plans  Subjective: No acute overnight events. Resting but awake in bed, with daughter asleep at bedside. NGT remains in place. She reports feeling better this morning, her voice has more strength. She notes some generalized abdominal pain, unable to localize. She has difficulty remembering yesterday with the revision surgery, but did recall some details as we talked. Notes legs feel cold to her, added an extra blanket which has helped.  Objective: Temp:  [98.1 F (36.7 C)-99.1 F (37.3 C)] 98.5 F (36.9 C) (12/14 0423) Pulse Rate:  [78-100] 100 (12/14 0423) Resp:  [20-29] 20 (12/14 0423) BP: (154-190)/(41-76) 180/64 mmHg (12/14 0423) SpO2:  [93 %-98 %] 95 % (12/14 0423) Weight:  [158 lb 11.7 oz (72 kg)-163 lb 1.6 oz (73.982 kg)] 163 lb 1.6 oz (73.982 kg) (12/13 2101) Physical Exam: General - laying in bed, pleasant, interactive, NAD.  Heart - RRR, S1/S2, no murmurs heard Lungs - CTAB, some diminished lung sounds bilateral bases Abd - soft, non-distended, generalized tenderness all quadrants, noted midline surgical wound appears dry and intact. Ostomy stump with pink viable tissue, no sign of  infection or ischemia today Neuro - No focal deficits. Awake, alert.  Laboratory:  Recent Labs Lab 09/03/13 0638 09/07/13 0357 09/08/13 0440  WBC 10.3 10.7* 14.0*  HGB 12.0 10.5* 9.7*  HCT 36.7 31.6* 30.2*  PLT 398 305 314    Recent Labs Lab 09/02/13 1220  09/03/13 0638 09/07/13 0357 09/07/13 0945 09/08/13 0440  NA  --   < > 134* 139 137 141  K  --   < > 4.4 2.6* 3.9 3.6  CL  --   < > 102 104 106 109  CO2  --   < > 22 24 22 23   BUN  --   < > 20 8 8 7   CREATININE  --   < > 1.03 0.78 0.72 0.74  CALCIUM  --   < > 8.6 7.3* 7.2* 7.3*  PROT 7.6  --  6.7  --   --   --  BILITOT 0.3  --  0.3  --   --   --   ALKPHOS 67  --  58  --   --   --   ALT <5  --  5  --   --   --   AST 9  --  8  --   --   --   GLUCOSE  --   < > 119* 198* 146* 131*  < > = values in this interval not displayed.  HgbA1c 7.2  ProBNP 1298.0 (prior baseline 200s)  PT 13.5 / INR 1.05, APTT 28  12/7 UA Large leuks, neg nitrite, mod hgb, WBC TNTC, rare squam, many bacteria - suggestive of UTI  12/7 Urine Culture (E. Coli >100,000 CFU, sensitive to CTX)  Imaging/Diagnostic Tests:  12/7 Acute Abd / Chest Xray IMPRESSION:  No acute cardiopulmonary findings.  Air-filled small bowel loops and colon with mild distention possibly  due to gastroenteritis or ileus. No free air  12/7 CT Abd / Pelvis IMPRESSION:  1. There is distension of the right colon and transverse colon with  air-fluid level. Distended distal small bowel loops with air-fluid  level suspicious for ileus or partial small bowel obstruction. There  is a apple core thickening of the colonic wall in the splenic  flexure of the colon axial image 31 and 32 highly suspicious for  colonic malignancy. Further correlation with colonoscopy is  recommended.  2. No hydronephrosis or hydroureter.  12/9 Colonoscopy Obstructing descending colon mass. Biopsy positive for Adenocarcinoma.  12/11 2D ECHO - Left ventricle: Inferobasal hypokinesis The  cavity size was normal. Wall thickness was increased in a pattern of mild LVH. Systolic function was normal. The estimated ejection fraction was in the range of 55% to 60%. Wall motion was normal; there were no regional wall motion abnormalities. - Aortic valve: Trivial regurgitation. - Mitral valve: Calcified annulus. - Atrial septum: There was increased thickness of the septum, consistent with lipomatous hypertrophy. No defect or patent foramen ovale was identified.  12/11 Surgical Pathology (Omental Mass) - pending   Saralyn Pilar, DO 09/09/2013, 8:27 AM PGY-1 [facility] Family Medicine FPTS Intern pager: [phone], text pages welcome

## 2013-09-09 NOTE — Progress Notes (Signed)
I have seen and examined this patient. I have discussed with Dr Karamalegos.  I agree with their findings and plans as documented in their progress note.      

## 2013-09-09 NOTE — Progress Notes (Signed)
1 Day Post-Op  Subjective: Pt c/o pain, not asking for pain meds.  No n/v.    Objective: Vital signs in last 24 hours: Temp:  [98.1 F (36.7 C)-99.1 F (37.3 C)] 98.5 F (36.9 C) (12/14 0423) Pulse Rate:  [78-100] 100 (12/14 0423) Resp:  [20-29] 20 (12/14 0423) BP: (163-190)/(47-76) 180/64 mmHg (12/14 0423) SpO2:  [93 %-98 %] 95 % (12/14 0423) Weight:  [158 lb 11.7 oz (72 kg)-163 lb 1.6 oz (73.982 kg)] 163 lb 1.6 oz (73.982 kg) (12/13 2101) Last BM Date: 09/02/13  Intake/Output from previous day: 12/13 0701 - 12/14 0700 In: 1357.5 [I.V.:1357.5] Out: 890 [Urine:875; Blood:15] Intake/Output this shift:   PE General appearance: alert, cooperative and no distress Resp: clear to auscultation bilaterally Cardio: s1s2 rrr no murmurs, gallops or rubs. GI: +bs, abdomen is soft, round, appropriately tender.  midline incision is c/d/i, honeycomb dressing.  stoma is pink and viable with liquid output, no stool.    Lab Results:   Recent Labs  09/07/13 0357 09/08/13 0440  WBC 10.7* 14.0*  HGB 10.5* 9.7*  HCT 31.6* 30.2*  PLT 305 314   BMET  Recent Labs  09/07/13 0945 09/08/13 0440  NA 137 141  K 3.9 3.6  CL 106 109  CO2 22 23  GLUCOSE 146* 131*  BUN 8 7  CREATININE 0.72 0.74  CALCIUM 7.2* 7.3*   PT/INR No results found for this basename: LABPROT, INR,  in the last 72 hours ABG  Recent Labs  09/06/13 2018  PHART 7.448  HCO3 25.8*    Studies/Results: No results found.  Anti-infectives: Anti-infectives   Start     Dose/Rate Route Frequency Ordered Stop   09/08/13 1214  dextrose 5 % with cefOXitin (MEFOXIN) ADS Med    Comments:  Alanda Amass   : cabinet override      09/08/13 1214 09/08/13 1333   09/06/13 0600  ertapenem (INVANZ) 1 g in sodium chloride 0.9 % 50 mL IVPB     1 g 100 mL/hr over 30 Minutes Intravenous On call to O.R. 09/05/13 1417 09/06/13 1242   09/03/13 1400  cefTRIAXone (ROCEPHIN) 1 g in dextrose 5 % 50 mL IVPB  Status:  Discontinued     1 g 100 mL/hr over 30 Minutes Intravenous Every 24 hours 09/02/13 1916 09/06/13 1836   09/02/13 1345  cefTRIAXone (ROCEPHIN) 1 g in dextrose 5 % 50 mL IVPB     1 g 100 mL/hr over 30 Minutes Intravenous  Once 09/02/13 1335 09/02/13 1643      Assessment/Plan: Adenocarcinoma of colon Partial colectomy with colostomy (Dr. Gerrit Friends 09/06/13) Colostomy revision (Dr. Lindie Spruce 09/08/13) POD#2 POD#1 Clamp NGT, DC in 6 hours if no n/v and start on sips of clears today WOC consult for ostomy care Stoma looks pink and viable, continue to monitor  IS Mobilize IV hydration Encouraged pain med use Remove dressing in AM We will discuss pathology    LOS: 7 days    Celica Kotowski ANP-BC 09/09/2013 9:12 AM

## 2013-09-09 NOTE — Progress Notes (Signed)
Agree with A&P of ER,NP. She was unaware of dx - doesn't know if she just doesn't remember or not available at surgery. At any rate, Bx shoed cancer. Discussed with her, but final path from surgery is still pending

## 2013-09-10 LAB — BASIC METABOLIC PANEL
BUN: 4 mg/dL — ABNORMAL LOW (ref 6–23)
CO2: 23 mEq/L (ref 19–32)
Calcium: 8.1 mg/dL — ABNORMAL LOW (ref 8.4–10.5)
GFR calc Af Amer: >90 mL/min (ref >90)
GFR calc non Af Amer: 79 mL/min — ABNORMAL LOW (ref >90)
Glucose, Bld: 131 mg/dL — ABNORMAL HIGH (ref 70–99)
Potassium: 4.6 mEq/L (ref 3.5–5.1)
Sodium: 136 mEq/L (ref 135–145)

## 2013-09-10 LAB — CBC
HCT: 34.1 % — ABNORMAL LOW (ref 36.0–46.0)
Hemoglobin: 10.8 g/dL — ABNORMAL LOW (ref 12.0–15.0)
MCH: 27.6 pg (ref 26.0–34.0)
MCHC: 31.7 g/dL (ref 30.0–36.0)
RBC: 3.92 MIL/uL (ref 3.87–5.11)

## 2013-09-10 LAB — GLUCOSE, CAPILLARY
Glucose-Capillary: 121 mg/dL — ABNORMAL HIGH (ref 70–99)
Glucose-Capillary: 128 mg/dL — ABNORMAL HIGH (ref 70–99)

## 2013-09-10 MED ORDER — ATORVASTATIN CALCIUM 40 MG PO TABS
40.0000 mg | ORAL_TABLET | Freq: Every day | ORAL | Status: DC
  Administered 2013-09-10 – 2013-09-13 (×4): 40 mg via ORAL
  Filled 2013-09-10 (×5): qty 1

## 2013-09-10 MED ORDER — TRAMADOL HCL 50 MG PO TABS
50.0000 mg | ORAL_TABLET | Freq: Four times a day (QID) | ORAL | Status: DC | PRN
  Administered 2013-09-12 – 2013-09-13 (×2): 50 mg via ORAL
  Filled 2013-09-10 (×3): qty 1

## 2013-09-10 MED ORDER — METOPROLOL TARTRATE 12.5 MG HALF TABLET
12.5000 mg | ORAL_TABLET | Freq: Two times a day (BID) | ORAL | Status: DC
  Administered 2013-09-11 (×2): 12.5 mg via ORAL
  Filled 2013-09-10 (×5): qty 1

## 2013-09-10 MED ORDER — BOOST / RESOURCE BREEZE PO LIQD
1.0000 | Freq: Three times a day (TID) | ORAL | Status: DC
  Administered 2013-09-11 (×2): 1 via ORAL
  Filled 2013-09-10: qty 1

## 2013-09-10 NOTE — Progress Notes (Signed)
    Subjective:  Patient denies CP or dyspnea; continued abdominal pain.  Objective:  Vital Signs in the last 24 hours: Temp:  [97.8 F (36.6 C)-98.5 F (36.9 C)] 98.2 F (36.8 C) (12/15 0403) Pulse Rate:  [82-93] 82 (12/15 0403) Resp:  [17-18] 17 (12/15 0403) BP: (145-155)/(62-68) 145/62 mmHg (12/15 0403) SpO2:  [94 %-98 %] 97 % (12/15 0403) Weight:  [162 lb 12.8 oz (73.846 kg)] 162 lb 12.8 oz (73.846 kg) (12/14 2044)  Intake/Output from previous day: 12/14 0701 - 12/15 0700 In: 825 [I.V.:825] Out: 1100 [Urine:950; Emesis/NG output:150]  Physical Exam: Pt is alert elderly woman in NAD HEENT: normal Neck: supple Lungs: CTA bilaterally anteriorly CV: RRR without murmur or gallop Abd: s/p abd surgery Ext: no edema Skin: warm/dry no rash   Lab Results:  Recent Labs  09/08/13 0440  WBC 14.0*  HGB 9.7*  PLT 314    Recent Labs  09/07/13 0945 09/08/13 0440  NA 137 141  K 3.9 3.6  CL 106 109  CO2 22 23  GLUCOSE 146* 131*  BUN 8 7  CREATININE 0.72 0.74    Cardiac Studies: 2-D echocardiogram: Study Conclusions  - Left ventricle: Inferobasal hypokinesis The cavity size was normal. Wall thickness was increased in a pattern of mild LVH. Systolic function was normal. The estimated ejection fraction was in the range of 55% to 60%. Wall motion was normal; there were no regional wall motion abnormalities. - Aortic valve: Trivial regurgitation. - Mitral valve: Calcified annulus. - Atrial septum: There was increased thickness of the septum, consistent with lipomatous hypertrophy. No defect or patent foramen ovale was identified.  Tele: Sinus rhythm with PACs and PVCs  Assessment/Plan:  1. Paroxysmal atrial fibrillation 2. Hypertension 3. Chronic diastolic heart failure  Overall the patient appears stable from a cardiac perspective. She remains in sinus; continue beta blocker; she should fu with me 2-4 weeks after DC; if stable from a GI perspective, will  begin anticoagulation at that time. Continue to watch volume status.  Olga Millers, M.D. 09/10/2013, 7:36 AM

## 2013-09-10 NOTE — Progress Notes (Signed)
Foley catheter d/c'd at 10:40.

## 2013-09-10 NOTE — Progress Notes (Signed)
NG tube removed at 13:10, tolerated well, no complications. Will continue to monitor.

## 2013-09-10 NOTE — Progress Notes (Signed)
NGT out, Stoma pink. Patient examined and I agree with the assessment and plan  Violeta Gelinas, MD, MPH, FACS Pager: [phone]  09/10/2013 1:45 PM

## 2013-09-10 NOTE — Progress Notes (Signed)
Physical Therapy Treatment Patient Details Name: [name] MRN: [id number] DOB: 1929-11-18 Today's Date: 09/10/2013 Time: 2952-8413 PT Time Calculation (min): 24 min  PT Assessment / Plan / Recommendation  History of Present Illness Pt is an 79 y/o female admitted with a SBO plus ileus, and was found to have an "apple-core lesion" on a CT scan. 12/11 underwent partial colectomy with colostomy for colon cancer. Remained intubated post-op.   PT Comments   Pt was highly motivated to participate in therapy and increase activity level. Pt required 2+ (A) for SPT bed to chair. Encouraged RN and NT to use lift to return to bed for safety. Pt limited throughout session due to pain and fear of falling. Pt reported PTA pt was ambulating without AD; mainly household distances. Will cont to follow per POC.   Follow Up Recommendations  SNF     Does the patient have the potential to tolerate intense rehabilitation     Barriers to Discharge        Equipment Recommendations  None recommended by PT    Recommendations for Other Services OT consult  Frequency Min 2X/week   Progress towards PT Goals Progress towards PT goals: Progressing toward goals  Plan Current plan remains appropriate    Precautions / Restrictions Precautions Precautions: Fall Restrictions Weight Bearing Restrictions: No   Pertinent Vitals/Pain C/o pain "all over"; reported pain was primarily in abdomen. Did not rate pain.    Mobility  Bed Mobility Bed Mobility: Supine to Sit;Sitting - Scoot to Edge of Bed Supine to Sit: 3: Mod assist;HOB elevated;With rails Sitting - Scoot to Edge of Bed: 3: Mod assist Details for Bed Mobility Assistance: (A) to bring shoulders and trunk to/off EOB; pt anxious and fearful of pain; requires increased time and max cues for sequencing; use of draw pad to bring hips to EOB and feet supported on ground; pt fearful of falling   Transfers Transfers: Sit to Stand;Stand to Sit;Stand Pivot  Transfers Sit to Stand: 1: +2 Total assist;From bed;From elevated surface;With upper extremity assist Sit to Stand: Patient Percentage: 10% Stand to Sit: 1: +2 Total assist;To chair/3-in-1;With upper extremity assist;With armrests Stand to Sit: Patient Percentage: 10% Stand Pivot Transfers: 1: +2 Total assist;From elevated surface Stand Pivot Transfers: Patient Percentage: 10% Details for Transfer Assistance: pt very fearful of falling with transfers; required max encouragement and 2 attempts to complete; pt able to minimally WB through LEs; LEs were buckling and required blocking to maintain throughout transfers; use of draw pad to extend hips and facilitate weightshift towards chair; max cues and encouragement; pt required (A) multiple times to be repositioned in the chair; use of draw pad to facilitate weightshifting in chair; pt incontinent and had urinated on draw pad; required (A) with hygiene   Ambulation/Gait Ambulation/Gait Assistance: Not tested (comment) Stairs: No Wheelchair Mobility Wheelchair Mobility: No         PT Diagnosis:    PT Problem List:   PT Treatment Interventions:     PT Goals (current goals can now be found in the care plan section) Acute Rehab PT Goals PT Goal Formulation: With patient Time For Goal Achievement: 09/11/13 Potential to Achieve Goals: Good  Visit Information  Last PT Received On: 09/10/13 Assistance Needed: +2 History of Present Illness: Pt is an 79 y/o female admitted with a SBO plus ileus, and was found to have an "apple-core lesion" on a CT scan. 12/11 underwent partial colectomy with colostomy for colon cancer. Remained intubated post-op.  Subjective Data  Subjective: Pt lying supine; agreeable to therapy. "i just know this is going to hurt but ill do with you two whatever i can"    Cognition  Cognition Arousal/Alertness: Awake/alert Behavior During Therapy: WFL for tasks assessed/performed Overall Cognitive Status: Within  Functional Limits for tasks assessed    Balance  Balance Balance Assessed: Yes Static Sitting Balance Static Sitting - Balance Support: Bilateral upper extremity supported;Feet unsupported Static Sitting - Level of Assistance: 5: Stand by assistance;4: Min assist Static Sitting - Comment/# of Minutes: pt initially required (A) maintaining balance sitting EOB; required max cues and encouragement; pt fearful of falling forward; progressed to sitting EOB with stand by (A); tolerated ~7 min   End of Session PT - End of Session Equipment Utilized During Treatment: Gait belt;Oxygen Activity Tolerance: Patient limited by fatigue;Patient limited by pain Patient left: in chair;with call bell/phone within reach Nurse Communication: Mobility status;Need for lift equipment;Precautions   GP     Donell Sievert, [place] [phone] 09/10/2013, 3:33 PM

## 2013-09-10 NOTE — Progress Notes (Signed)
2 Days Post-Op  Subjective: Pt doesn't walk at home, did not sit up in chair yesterday.  Pulling <54ml on IS.  NGT was clamped not sure how long yesterday.  Clear output from NGT.    Objective: Vital signs in last 24 hours: Temp:  [97.8 F (36.6 C)-98.5 F (36.9 C)] 98.2 F (36.8 C) (12/15 0403) Pulse Rate:  [82-93] 82 (12/15 0403) Resp:  [17-18] 17 (12/15 0403) BP: (145-155)/(62-68) 145/62 mmHg (12/15 0403) SpO2:  [94 %-98 %] 97 % (12/15 0403) Weight:  [162 lb 12.8 oz (73.846 kg)] 162 lb 12.8 oz (73.846 kg) (12/14 2044) Last BM Date: 09/09/13  Intake/Output from previous day: 12/14 0701 - 12/15 0700 In: 825 [I.V.:825] Out: 1100 [Urine:950; Emesis/NG output:150] Intake/Output this shift:   PE  General appearance: alert, cooperative and no distress  Resp: clear to auscultation bilaterally  Cardio: s1s2 rrr no murmurs, gallops or rubs.  GI: +bs, abdomen is soft, round, appropriately tender. midline incision is c/d/i, honeycomb dressing. stoma is pink and viable with liquid output, no stool.    Lab Results:   Recent Labs  09/08/13 0440 09/10/13 0740  WBC 14.0* 10.1  HGB 9.7* 10.8*  HCT 30.2* 34.1*  PLT 314 410*   BMET  Recent Labs  09/08/13 0440 09/10/13 0525  NA 141 136  K 3.6 4.6  CL 109 103  CO2 23 23  GLUCOSE 131* 131*  BUN 7 4*  CREATININE 0.74 0.70  CALCIUM 7.3* 8.1*   PT/INR No results found for this basename: LABPROT, INR,  in the last 72 hours ABG No results found for this basename: PHART, PCO2, PO2, HCO3,  in the last 72 hours  Studies/Results: No results found.  Anti-infectives: Anti-infectives   Start     Dose/Rate Route Frequency Ordered Stop   09/08/13 1214  dextrose 5 % with cefOXitin (MEFOXIN) ADS Med    Comments:  Alanda Amass   : cabinet override      09/08/13 1214 09/08/13 1333   09/06/13 0600  ertapenem (INVANZ) 1 g in sodium chloride 0.9 % 50 mL IVPB     1 g 100 mL/hr over 30 Minutes Intravenous On call to O.R. 09/05/13 1417  09/06/13 1242   09/03/13 1400  cefTRIAXone (ROCEPHIN) 1 g in dextrose 5 % 50 mL IVPB  Status:  Discontinued     1 g 100 mL/hr over 30 Minutes Intravenous Every 24 hours 09/02/13 1916 09/06/13 1836   09/02/13 1345  cefTRIAXone (ROCEPHIN) 1 g in dextrose 5 % 50 mL IVPB     1 g 100 mL/hr over 30 Minutes Intravenous  Once 09/02/13 1335 09/02/13 1643      Assessment/Plan: Adenocarcinoma of colon  Partial colectomy with colostomy (Dr. Gerrit Friends 09/06/13)  Colostomy revision (Dr. Lindie Spruce 09/08/13)  POD#3 POD#2 DC NGT and start sips of clears today.  WOC consult for ostomy care  Stoma looks pink and viable, continue to monitor  IS  DC foley Mobilize, pt needs to at least sit up in chair, PT consult  Reduce IV hydration  Start tramadol, family medicine team to address home meds(pt takes morphine) Remove dressing Final pathology is pending.  Dr. Jamey Ripa discussed biopsy results with the patient and daughter.     LOS: 8 days    Zeda Gangwer ANP-BC 09/10/2013 8:17 AM

## 2013-09-10 NOTE — Consult Note (Signed)
WOC ostomy consult note Stoma type/location:  RLQ, end colostomy Stomal assessment/size: 1 3/4" round, nicely budded from the skin now, pink and moist Peristomal assessment: pouch intact Output no fecal output yet, some flatus today Ostomy pouching: 2pc in place, supplies in the room.  Pouch intact from revision this weekend.  Education provided: Transport planner to the room.  Spoke with the daughter this am Corrie [name]) she cares for her mother at home. She reports her mother has been bedbound since 2008. She can make a few steps to the [facility].  She has some memory issues also. Corrie [name] will be the primary CG and will need to learn to care for her mothers ostomy.  I have arranged to meet with [name] in the am for beginning ostomy teaching.    WOC will follow along with you for ostomy teaching and support  [name] Superior RN,CWOCN [phone]

## 2013-09-10 NOTE — Progress Notes (Signed)
FMTS Attending Note Patient seen and examined by me, discussed with resident team and I agree with Dr Althea Charon' assessment and plan. Patient reports some improvement in her abdominal discomfort postoperatively.   [name]

## 2013-09-10 NOTE — Progress Notes (Signed)
Family Medicine Teaching Service Daily Progress Note Intern Pager: [phone]  Patient name: [name] Medical record number: [id number] Date of birth: 12/19/1929 Age: 79 y.o. Gender: female  Primary Care Provider: Kaleen Mask, MD Consultants: General Surgery, GI Code Status: Full  Pt Overview and Major Events to Date:  12/7 - CT Abd showing distended R-colon, ileus / partial SBO, "apple-core lesion" suspicious for malignancy 12/8 - c/s GI --> plan for colonoscopy 12/9 12/9 - scheduled colonoscopy, Gen surg cont to follow (awaiting results - aware of potential need for surgery) 12/10 - Colonoscopy (obstructing descending colon mass), tentative surgery 12/11 for resection pending cardiac clearance / medical stability 12/11 - ECHO normal EF, partial colon resection w/ colostomy, ICU o/n while on extubation 12/12 - POD #1. Extubated in ICU, plan to transfer to floor 12/13 - OR for ostomy revision (ischemia) 12/14 - NGT removed  Assessment and Plan:  [name] is a 79 y.o. female presenting with abdominal pain, nausea and vomiting consistent with ileus and with possible bowel obstruction. PMH is significant for DM type 2, left sided CVA, atrial fibrillation, Chronic HF w/ preserved EF, and HTN.  # Abdominal Pain/Distention, secondary to Obstructing Colon Adenocarcinoma Initial work-up of abdominal pain with CT demonstrated Ileus/partial SBO of small bowel as well as ileus of colon with thickening of colon at splenic flexure concerning for mass noted on CT scan, also possibly related to adhesions from abdominal hysterectomy in 1980s; Of note GI consult note in 2011 states that pt has a history of SBO, but pt and daughter deny this at present; No acute abdomen on admission. - Pain Management:    - Morphine IV 2mg  q 3 hr PRN moderate pain (received x 3 doses 24 hrs), 1x today (0331)    - Fentanyl 25-30mcg q 2 hr PRN severe pain (received none 24 hrs) - PT/OT consult  (post-op), previously recommended SNF - c/s General Surgery - greatly appreciate all recs     - POD# 3          - s/p partial colectomy with colostomy (adenocarcinoma removed).          - Required revision colostomy (OR 12/13) due to ischemia. Today ostomy stump appears viable          - DC'd NGT today (clamped yesterday), advance diet to sips clear liquids today     - WOC consult for ostomy care     - expect to remain in hospital for about 1 week recovery, management of ileus, and advance diet     - plan for outpatient follow-up arrangements with [facility] (in approx 3 weeks     - [ ]  f/u omental mass pathology     - CEA 1.4 (normal range) - c/s GI Deboraha Sprang) - greatly appreciate all recs      - 12/9 colonoscopy --> obstructing descending colon mass - identified as adenocarcinoma per biopsy  # Chronic Constipation - plan to start regular bowel regimen when tolerating PO   # Urinary Tract Infection, uncomplicated - Resolved U/A showing + leukocytes and bacteria; previous UTI in July 214 was E. Coli sensitive to CTX. - completed CTX 3 Day course - Urine culture (E. Coli >100,000CFU, sensitive to CTX, Nitro, tobramycin, Resistant to Cefazolin) - Patient to follow up with Urology on discharge - Remove Foley today  # Paroxysmal Atrial Fibrillation Patient denies known history and not on anticoagulation (CHADSVASC score 9); EKG showed A-fib on admission and present on multiple  EKG's in 2013. History of GI bleeding in 2011, so no anticoagulation at this time (HAS Bled Score 5)  - Continue to monitor on telemetry, reviewed rhythm per telemetry (NSR, with regular p waves, several PACs, PVCs with some irregularity, but no documented AFib per tele. - switch to Metoprolol PO 12.5mg  BID tomorrow, now tolerating PO - c/s Cardiology, greatly appreciate all recs   - continue Metoprolol 2.5mg  IV q 6 hr daily today   - f/u outpatient 2-4 weeks after DC, if stable from GI concerns, will plan for  starting anti-coagulation  # Chronic HF, with preserved EF - Grade 1 diastolic dysfunction per Echo 9811 - ECHO normal EF (55-60%), mild LVH - Consider adding ACEI on D/C  # COPD - Hx of taking Spiriva and Albuterol, but currently taking no meds - Use albuterol inhaler PRN - on 1L O2 via Bruno, monitor and titrate down as tolerated  # Tobacco Abuse - current daily smoke, pack years > 50  - Nicotine patch PRN  # Diabetes Mellitus - Hgb A1c 7.2 (09/03/13) - CBG's stable.  Will change to Q6H.  - SSI sesnitive (has not received any yet) - now resuming some PO intake  # Cerebrovascular Disease s/p left sided CVA 2008; History of taking Aggrenox but stopped in 2011 after upper GI bleed from multiple bleeding ulcers and started Plavix, but not currently taking any meds for this problem  - Given history of stroke, hold anti-coagulation until cleared per surgery - discuss possibility of resuming Plavix tomorrow with patient  # HLD Previously taking simvastatin 40 mg, but no longer on statin on admission - resume Lipitor 40mg  daily, now taking PO  # Memory Deficits / Functional Decline - Pt admitted memory loss, not oriented to time; likely component of vascular dementia  - Continue to monitor, consider MMSE  FEN/GI: NPO with NGT, D5-1/2 NS --> decreased from 75cc/hr to 50cc/hr (concern for fluid overload)  Prophylaxis: Heparin SQ TID  Disposition:  Patient was transferred to regular floor 09/08/13. Expect about multiple day to 1 week recovery from surgery, pending clinical course improvement will continue to work on future placement plans with CSW, expect discharge to SNF - CSW following per discharge plans  Subjective: No acute overnight events. Resting in bed, still with NGT in place. She states feeling a little better today with regards to her abdominal pain, states it is controlled with Morphine at current dose. Denies any nausea. States she is hungry and has an appetite, ready for NGT  to be removed and start a slow diet. Denies any CP, or SOB. Currently on 1L O2.  Objective: Temp:  [97.8 F (36.6 C)-99.3 F (37.4 C)] 99.3 F (37.4 C) (12/15 1326) Pulse Rate:  [74-92] 74 (12/15 1326) Resp:  [17-18] 18 (12/15 1000) BP: (145-176)/(55-75) 176/75 mmHg (12/15 1326) SpO2:  [91 %-98 %] 91 % (12/15 1326) Weight:  [162 lb 12.8 oz (73.846 kg)] 162 lb 12.8 oz (73.846 kg) (12/14 2044) Physical Exam: General - laying in bed resting, pleasant, in good spirits, interactive, NAD.  Heart - RRR, S1/S2, no murmurs heard Lungs - CTAB, some diminished lung sounds bilateral bases Abd - soft, non-distended, generalized tenderness all quadrants, noted midline surgical wound appears dry and intact. Ostomy stump with pink viable tissue, no sign of infection or ischemia, wounds dressed appropriately Neuro - No focal deficits. Awake, alert.  Laboratory:  Recent Labs Lab 09/07/13 0357 09/08/13 0440 09/10/13 0740  WBC 10.7* 14.0* 10.1  HGB 10.5* 9.7*  10.8*  HCT 31.6* 30.2* 34.1*  PLT 305 314 410*    Recent Labs Lab 09/07/13 0945 09/08/13 0440 09/10/13 0525  NA 137 141 136  K 3.9 3.6 4.6  CL 106 109 103  CO2 22 23 23   BUN 8 7 4*  CREATININE 0.72 0.74 0.70  CALCIUM 7.2* 7.3* 8.1*  GLUCOSE 146* 131* 131*    HgbA1c 7.2  ProBNP 1298.0 (prior baseline 200s)  PT 13.5 / INR 1.05, APTT 28  12/7 UA Large leuks, neg nitrite, mod hgb, WBC TNTC, rare squam, many bacteria - suggestive of UTI  12/7 Urine Culture (E. Coli >100,000 CFU, sensitive to CTX)  Imaging/Diagnostic Tests:  12/7 Acute Abd / Chest Xray IMPRESSION:  No acute cardiopulmonary findings.  Air-filled small bowel loops and colon with mild distention possibly  due to gastroenteritis or ileus. No free air  12/7 CT Abd / Pelvis IMPRESSION:  1. There is distension of the right colon and transverse colon with  air-fluid level. Distended distal small bowel loops with air-fluid  level suspicious for ileus or  partial small bowel obstruction. There  is a apple core thickening of the colonic wall in the splenic  flexure of the colon axial image 31 and 32 highly suspicious for  colonic malignancy. Further correlation with colonoscopy is  recommended.  2. No hydronephrosis or hydroureter.  12/9 Colonoscopy Obstructing descending colon mass. Biopsy positive for Adenocarcinoma.  12/11 2D ECHO - Left ventricle: Inferobasal hypokinesis The cavity size was normal. Wall thickness was increased in a pattern of mild LVH. Systolic function was normal. The estimated ejection fraction was in the range of 55% to 60%. Wall motion was normal; there were no regional wall motion abnormalities. - Aortic valve: Trivial regurgitation. - Mitral valve: Calcified annulus. - Atrial septum: There was increased thickness of the septum, consistent with lipomatous hypertrophy. No defect or patent foramen ovale was identified.  12/11 Surgical Pathology (Omental Mass) - pending   Saralyn Pilar, DO 09/10/2013, 1:42 PM PGY-1 [facility] Family Medicine FPTS Intern pager: [phone], text pages welcome

## 2013-09-10 NOTE — Progress Notes (Signed)
INITIAL NUTRITION ASSESSMENT  DOCUMENTATION CODES Per approved criteria  -Not Applicable   INTERVENTION: Resource Breeze tid Monitor diet advancement as medically able.  NUTRITION DIAGNOSIS: Inadequate oral intake related to altered gi function as evidenced by clear liquid diet.   Goal: Diet advancement as medically appropriate  Monitor:  Diet advancement, labs, weight trend, intake and tolerance of diet  Reason for Assessment: NPO/CL x 7 days  79 y.o. female  Admitting Dx: Adenocarcinoma, colon  ASSESSMENT: Pt is an 79 y/o female admitted with a SBO plus ileus, and was found to have an "apple-core lesion" on a CT scan. 12/11 underwent partial colectomy with colostomy for colon cancer.   Patient reports good intake of regular diet prior to admit.  Last had solid foods approximately 1 week ago.  Tolerating clear liquid diet.  No flatus or BM yet per patient.     Height: Ht Readings from Last 1 Encounters:  09/08/13 5\' 3"  (1.6 m)    Weight: Wt Readings from Last 1 Encounters:  09/09/13 162 lb 12.8 oz (73.846 kg)    Ideal Body Weight: 115 lbs  % Ideal Body Weight: 141  Wt Readings from Last 10 Encounters:  09/09/13 162 lb 12.8 oz (73.846 kg)  09/09/13 162 lb 12.8 oz (73.846 kg)  09/09/13 162 lb 12.8 oz (73.846 kg)  09/09/13 162 lb 12.8 oz (73.846 kg)    Usual Body Weight: "varies up and down"  % Usual Body Weight:   BMI:  Body mass index is 28.85 kg/(m^2).  Estimated Nutritional Needs: Kcal: 1700-1800 Protein: 75-85 gm Fluid: >1.7L   Diet Order: Clear Liquid  EDUCATION NEEDS: -No education needs identified at this time   Intake/Output Summary (Last 24 hours) at 09/10/13 1736 Last data filed at 09/10/13 1255  Gross per 24 hour  Intake   1065 ml  Output   1475 ml  Net   -410 ml    Last BM: unknown  Labs:   Recent Labs Lab 09/07/13 0945 09/08/13 0440 09/10/13 0525  NA 137 141 136  K 3.9 3.6 4.6  CL 106 109 103  CO2 22 23 23   BUN 8  7 4*  CREATININE 0.72 0.74 0.70  CALCIUM 7.2* 7.3* 8.1*  GLUCOSE 146* 131* 131*    CBG (last 3)   Recent Labs  09/10/13 0724 09/10/13 1127 09/10/13 1624  GLUCAP 125* 128* 113*    Scheduled Meds: . antiseptic oral rinse  15 mL Mouth Rinse QID  . atorvastatin  40 mg Oral q1800  . chlorhexidine  15 mL Mouth Rinse BID  . insulin aspart  0-15 Units Subcutaneous Q4H  . metoprolol  2.5 mg Intravenous Q6H  . [START ON 09/11/2013] metoprolol tartrate  12.5 mg Oral BID  . pantoprazole (PROTONIX) IV  40 mg Intravenous Q24H    Continuous Infusions: . dextrose 5 % and 0.45 % NaCl with KCl 40 mEq/L 50 mL/hr (09/10/13 1043)  . lactated ringers 20 mL/hr at 09/08/13 1151    Past Medical History  Diagnosis Date  . COPD (chronic obstructive pulmonary disease)   . CHF (congestive heart failure)     ECHO 2011  EF: 60% to 65%, grd 1 distolic dysfxn, 40-98% 2012 ECHO, tech limited by Afib  . Hypertension   . Stroke 2008    rt side deficit  . A-fib   . Diabetes mellitus   . Back pain   . Constipation   . SBO (small bowel obstruction)     per GI note  in 2011, pt denies  . Upper GI bleed 2011    required hospitalization  . Colon polyps   . Tobacco abuse     Past Surgical History  Procedure Laterality Date  . Total abdominal hysterectomy  1980  . Colonoscopy N/A 09/04/2013    Procedure: COLONOSCOPY;  Surgeon: Shirley Friar, MD;  Location: [facility] ENDOSCOPY;  Service: Endoscopy;  Laterality: N/A;  . Colon resection N/A 09/06/2013    Procedure: PARTIAL COLECTOMY WITH COLOSTOMY;  Surgeon: Velora Heckler, MD;  Location: [facility] OR;  Service: General;  Laterality: N/A;    Oran Rein, RD, LDN Clinical Inpatient Dietitian Pager:  [phone] Weekend and after hours pager:  [phone]

## 2013-09-10 NOTE — Clinical Social Work Placement (Addendum)
Clinical Social Work Department CLINICAL SOCIAL WORK PLACEMENT NOTE 09/10/2013  Patient:  KAYCEE, HAYCRAFT  Account Number:  [id number] Admit date:  09/02/2013  Clinical Social Worker:  Genelle Bal, LCSW  Date/time:  09/10/2013 05:03 AM  Clinical Social Work is seeking post-discharge placement for this patient at the following level of care:   SKILLED NURSING   (*CSW will update this form in Epic as items are completed)   09/10/2013  Patient/family provided with [facility] Department of Clinical Social Work's list of facilities offering this level of care within the geographic area requested by the patient (or if unable, by the patient's family).  09/10/2013  Patient/family informed of their freedom to choose among providers that offer the needed level of care, that participate in Medicare, Medicaid or managed care program needed by the patient, have an available bed and are willing to accept the patient.    Patient/family informed of [facility]' ownership interest in [facility], as well as of the fact that they are under no obligation to receive care at this facility.  PASARR submitted to EDS on [id number] PASARR number received from EDS on [id number] - [phone] [id number]  FL2 transmitted to all facilities in geographic area requested by pt/family on  09/10/2013 FL2 transmitted to all facilities within larger geographic area on   Patient informed that his/her managed care company has contracts with or will negotiate with  certain facilities, including the following:     Patient/family informed of bed offers received:  09/10/2013 and 09/13/13 Patient chooses bed at   Physician recommends and patient chooses bed at    Patient to be transferred to on   Patient to be transferred to facility by   The following physician request were entered in Epic:  Additional Comments 08/1713 - CSW spoke with patient's daughter, Salvadore Dom regarding SNF placement for ST rehab. Ms. Ezzard Standing  wants patient to discharge home and informed CSW that she provides 24/7 care for patient. CSW and daughter discussed patient getting HH services, to include PT/OT and daughter is in agreement with that. MD contacted and advised of daughter decision to take patient home.

## 2013-09-10 NOTE — Clinical Social Work Note (Signed)
CSW met with patient regarding discharge planning and rehab in a skilled facility at discharge. Patient is alert and oriented and was agreeable to talking with CSW about discharge planning. She is agreeable to rehab and made sure that going to a nursing home would be temporary and CSW assured her that it would be. CSW will continue to work with patient and family regarding placement.  Genelle Bal, MSW, LCSW [phone]

## 2013-09-11 ENCOUNTER — Encounter (HOSPITAL_COMMUNITY): Payer: Self-pay | Admitting: General Surgery

## 2013-09-11 LAB — PREALBUMIN: Prealbumin: 5.6 mg/dL — ABNORMAL LOW (ref 17.0–34.0)

## 2013-09-11 LAB — CBC
HCT: 35.5 % — ABNORMAL LOW (ref 36.0–46.0)
Hemoglobin: 11.4 g/dL — ABNORMAL LOW (ref 12.0–15.0)
RBC: 4.14 MIL/uL (ref 3.87–5.11)
RDW: 15.6 % — ABNORMAL HIGH (ref 11.5–15.5)
WBC: 9.7 10*3/uL (ref 4.0–10.5)

## 2013-09-11 LAB — BASIC METABOLIC PANEL
CO2: 24 mEq/L (ref 19–32)
Calcium: 8.5 mg/dL (ref 8.4–10.5)
Creatinine, Ser: 0.67 mg/dL (ref 0.50–1.10)
GFR calc non Af Amer: 80 mL/min — ABNORMAL LOW (ref >90)
Glucose, Bld: 142 mg/dL — ABNORMAL HIGH (ref 70–99)
Potassium: 4.7 mEq/L (ref 3.5–5.1)
Sodium: 137 mEq/L (ref 135–145)

## 2013-09-11 LAB — GLUCOSE, CAPILLARY
Glucose-Capillary: 128 mg/dL — ABNORMAL HIGH (ref 70–99)
Glucose-Capillary: 187 mg/dL — ABNORMAL HIGH (ref 70–99)
Glucose-Capillary: 157 mg/dL — ABNORMAL HIGH (ref 70–99)

## 2013-09-11 MED ORDER — SENNOSIDES-DOCUSATE SODIUM 8.6-50 MG PO TABS
1.0000 | ORAL_TABLET | Freq: Two times a day (BID) | ORAL | Status: DC
  Administered 2013-09-11 – 2013-09-14 (×7): 1 via ORAL
  Filled 2013-09-11 (×11): qty 1

## 2013-09-11 MED ORDER — PANTOPRAZOLE SODIUM 40 MG PO TBEC
40.0000 mg | DELAYED_RELEASE_TABLET | Freq: Every day | ORAL | Status: DC
  Administered 2013-09-11 – 2013-09-13 (×3): 40 mg via ORAL
  Filled 2013-09-11 (×4): qty 1

## 2013-09-11 MED ORDER — ENOXAPARIN SODIUM 40 MG/0.4ML ~~LOC~~ SOLN
40.0000 mg | SUBCUTANEOUS | Status: DC
  Administered 2013-09-11 – 2013-09-14 (×4): 40 mg via SUBCUTANEOUS
  Filled 2013-09-11 (×4): qty 0.4

## 2013-09-11 MED ORDER — INSULIN ASPART 100 UNIT/ML ~~LOC~~ SOLN
0.0000 [IU] | Freq: Three times a day (TID) | SUBCUTANEOUS | Status: DC
  Administered 2013-09-12: 3 [IU] via SUBCUTANEOUS
  Administered 2013-09-12 – 2013-09-13 (×3): 2 [IU] via SUBCUTANEOUS
  Administered 2013-09-13: 3 [IU] via SUBCUTANEOUS
  Administered 2013-09-14 (×2): 2 [IU] via SUBCUTANEOUS

## 2013-09-11 MED ORDER — INSULIN ASPART 100 UNIT/ML ~~LOC~~ SOLN: 0.0000 [IU] | Freq: Every day | SUBCUTANEOUS | Status: DC

## 2013-09-11 MED ORDER — CLOPIDOGREL BISULFATE 75 MG PO TABS
75.0000 mg | ORAL_TABLET | Freq: Every day | ORAL | Status: DC
  Administered 2013-09-12 – 2013-09-14 (×3): 75 mg via ORAL
  Filled 2013-09-11 (×4): qty 1

## 2013-09-11 NOTE — Progress Notes (Addendum)
Doing well. I reviewed pathology with her and her daughter. Patient examined and I agree with the assessment and plan Air and some stool in bag - advance to fulls. Violeta Gelinas, MD, MPH, FACS Pager: [phone]  09/11/2013 4:12 PM

## 2013-09-11 NOTE — Progress Notes (Signed)
3 Days Post-Op  Subjective: Says she is sore, nothing in the colostomy except a small amount of gas.  Objective: Vital signs in last 24 hours: Temp:  [98.1 F (36.7 C)-99.9 F (37.7 C)] 98.8 F (37.1 C) (12/16 0900) Pulse Rate:  [73-97] 89 (12/16 0900) Resp:  [17-18] 18 (12/16 0900) BP: (146-176)/(60-80) 146/68 mmHg (12/16 0900) SpO2:  [91 %-99 %] 97 % (12/16 0900) Weight:  [74.934 kg (165 lb 3.2 oz)] 74.934 kg (165 lb 3.2 oz) (12/15 2004) Last BM Date: 09/09/13 600 ml PO recorded, nothing from the colostomy. Afebrile, VSS Labs OK  Intake/Output from previous day: 12/15 0701 - 12/16 0700 In: 1917.9 [P.O.:600; I.V.:1317.9] Out: 375 [Urine:375] Intake/Output this shift:    General appearance: alert, cooperative, no distress and sore to touch Resp: clear to auscultation bilaterally and decreased BS at bases GI: soft, few hypoactive BS, some gas in the ostomy bag, nothing else.  ostomy looks ok thru pouch.  Midline incision OK, a little red at staple sites.  Lab Results:   Recent Labs  09/10/13 0740 09/11/13 0837  WBC 10.1 9.7  HGB 10.8* 11.4*  HCT 34.1* 35.5*  PLT 410* 530*    BMET  Recent Labs  09/10/13 0525 09/11/13 0700  NA 136 137  K 4.6 4.7  CL 103 102  CO2 23 24  GLUCOSE 131* 142*  BUN 4* 3*  CREATININE 0.70 0.67  CALCIUM 8.1* 8.5   PT/INR No results found for this basename: LABPROT, INR,  in the last 72 hours  No results found for this basename: AST, ALT, ALKPHOS, BILITOT, PROT, ALBUMIN,  in the last 168 hours   Lipase     Component Value Date/Time   LIPASE 19 09/02/2013 1220     Studies/Results: No results found.  Medications: . atorvastatin  40 mg Oral q1800  . feeding supplement (RESOURCE BREEZE)  1 Container Oral TID BM  . insulin aspart  0-15 Units Subcutaneous Q4H  . metoprolol tartrate  12.5 mg Oral BID  . pantoprazole  40 mg Oral Q2000  . senna-docusate  1 tablet Oral BID   . dextrose 5 % and 0.45 % NaCl with KCl 40 mEq/L 50  mL/hr at 09/11/13 0016  . lactated ringers 20 mL/hr at 09/08/13 1151    Assessment/Plan Obstructing neoplasm of colon  S/p PARTIAL COLECTOMY WITH COLOSTOMY, 09/06/13.  Dr. Darnell Level Colostomy ischemia, with colostomy revision 09/08/13 Dr. Lindie Spruce  Chronic constipation  UTI  PAF COPD/tobacco use AODM Hx of CVA Hyperlipidemia Functional memory decline.   Plan:   It says she is on a full liquid diet, changed this AM.  i am going to leave her on clears, till she has something coming from the ostomy.  She needs to be mobilized and she should be on DVT prophylaxis.  I will get OT/PT consults, add SCD's, and defer to medicine about chemical prophylaxis.  She has been off PO's for several days, I will check prealbumin. If she does not open up soon, may consider TNA.  LOS: 9 days    [name] 09/11/2013

## 2013-09-11 NOTE — Progress Notes (Signed)
FMTS Attending NOte Patient seen and examined by me today, discussed with resident team and I agree with Dr Althea Charon' note for today.  Paula Compton, MD

## 2013-09-11 NOTE — Consult Note (Signed)
WOC ostomy follow up Stoma type/location: LUQ, end colostomy Stomal assessment/size: 1 3/4" round, budded from the skin, pink, moist Peristomal assessment: intact Treatment options for stomal/peristomal skin: none Output none, some flatus Ostomy pouching: 2pc. 2 3/4" placed today, I will order her smaller flange for next pouch change Education provided: booklet provided to her CG this am, her daughter is her primary CG at home. Pt is bedbound and and has dementia.. Pt's daughter had colostomy for 4 months after a gunshot wound has a young adult.  Demonstrated pouch removal, cleaning of the peristomal skin, measurement of stoma and cutting of new wafer.  Attached pouch to the wafer and demonstrated either downward or to the patients side based on the fact pt bedbound.  CG practiced with lock and roll closure, both closing and opening.   Enrolled patient in [place] Secure Start Discharge program: Yes Follow up planned for later in the week with CG to allow her to practice ostomy pouch change.   WOC Team will follow along with you for ostomy teaching and support. Magdalen Cabana Ullin RN,CWOCN [phone]

## 2013-09-11 NOTE — Progress Notes (Signed)
Family Medicine Teaching Service Daily Progress Note Intern Pager: [phone]  Patient name: [name] Medical record number: [id number] Date of birth: 03-28-1930 Age: 79 y.o. Gender: female  Primary Care Provider: Kaleen Mask, MD Consultants: General Surgery, GI Code Status: Full  Pt Overview and Major Events to Date:  12/7 - CT Abd showing distended R-colon, ileus / partial SBO, "apple-core lesion" suspicious for malignancy 12/8 - c/s GI --> plan for colonoscopy 12/9 12/9 - scheduled colonoscopy, Gen surg cont to follow (awaiting results - aware of potential need for surgery) 12/10 - Colonoscopy (obstructing descending colon mass), tentative surgery 12/11 for resection pending cardiac clearance / medical stability 12/11 - ECHO normal EF, partial colon resection w/ colostomy, ICU o/n while on extubation 12/12 - POD #1. Extubated in ICU, plan to transfer to floor 12/13 - OR for ostomy revision (ischemia) 12/14 - NGT clamped 12/15 - NGT removed, advanced to clear liquid diet, PT-->SNF, CSW seeking placement offers 12/16 - Resume Plavix, remain clear liquid diet (no ostomy output), resume Lovenox DVT prophylaxis  Assessment and Plan:  [name] is a 79 y.o. 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 PMH is significant for DM type 2, left sided CVA, paroxysmal atrial fibrillation, Chronic HF w/ preserved EF, and HTN.  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 Noted chart review h/o GI consult note in 2011 states that pt has a  history of SBO. Currently pain appears to be improved and well controlled on fewer doses of Morphine. Appetite increased and tolerating clear liquids with intention to gradually advance, pending ostomy output. No BM yet, continue to follow - Pain Management: (DC'd Fentanyl)    - Morphine IV 2mg  q 3 hr PRN moderate pain (received x 2 doses 24 hrs, last 2033) - Received x 1 (@ 0903) today. - PT (post-op) --> recommended SNF, (able to transfer pt to chair with significant assistance) - c/s General Surgery - greatly appreciate all recs     - POD# 5          - s/p partial colectomy with colostomy (adenocarcinoma removed).          - Required revision colostomy (OR 12/13) due to ischemia. Ostomy stump remains viable.          - s/p NGT removal, remains on clear liquid diet today, with nutrition supplement.          - hold advancing diet from clear to full liquids until evidence of ostomy output     - WOC consult for ostomy care     - expect to remain in hospital until end of week for recovery, management of ileus     - plan for outpatient follow-up arrangements with [facility] (in approx 3 weeks     - (09/06/13) Pathology Report - colon (adenocarcinoma with uninvolved margins, benign lymph nodes), Omental nodule (fat necrosis with fibrosis, No malignancy).     - CEA 1.4 (normal range) - c/s GI Deboraha Sprang) - greatly appreciate all recs      - 12/9 colonoscopy --> obstructing descending colon mass - identified as adenocarcinoma per biopsy  #  Chronic Constipation, in setting of recent colectomy with ileus - started bowel regimen with Docusate-Senna, now that pt is taking PO  # Paroxysmal Atrial Fibrillation Patient denies known history and not on anticoagulation (CHADSVASC score 9); EKG showed A-fib on admission and present on multiple EKG's in 2013. History of GI bleeding in 2011, so no anticoagulation at this time (HAS Bled Score 5)  - Continue to monitor on telemetry, reviewed rhythm per  telemetry (NSR, with regular p waves, several PACs, PVCs with some irregularity, but no documented AFib per tele. - switch to Metoprolol PO 12.5mg  BID today - c/s Cardiology, greatly appreciate all recs   - continue Metoprolol   - f/u outpatient 2-4 weeks after DC, if stable from GI concerns, will plan for starting anti-coagulation  # Chronic HF, with preserved EF - Grade 1 diastolic dysfunction per Echo 4098 - ECHO normal EF (55-60%), mild LVH - Consider adding ACEI on D/C  # COPD Hx of taking Spiriva and Albuterol, but currently taking no meds - Use albuterol inhaler PRN - on 1-2L O2 via [place], monitor and titrate down as tolerated  # Inadequate oral intake 2/2 altered GI function Currently on clear liquid diet, considered advance to full liquid. Will remain on clear liquid for now. - Nutrition recommended Resource Breeze TID for supplementation - ordered Pre-albumin - If persistent ileus without BM, consider TPN. Hold full liquid or advanced diet currently  # Tobacco Abuse current daily smoke, pack years > 50  - Nicotine patch PRN  # Diabetes Mellitus - Hgb A1c 7.2 (09/03/13) - CBG's stable (100-120s). Now PO intake, change to CBGs q 4 hr AC, bedtime  - SSI Moderate - now resuming some PO intake  # Cerebrovascular Disease s/p left sided CVA 2008; History of taking Aggrenox but stopped in 2011 after upper GI bleed from multiple bleeding ulcers and started Plavix, but not currently taking any meds for this problem  - Given history of stroke, hold anti-coagulation until cleared per surgery - Resume Plavix 75mg  daily, d/t stroke risk outweigh concerns of fall risk vs GI bleed  # HLD Previously taking simvastatin 40 mg, but no longer on statin at admission - Lipitor 40mg  daily  # Memory Deficits / Functional Decline - Pt admitted memory loss, not oriented to time; likely component of vascular dementia  - Continue to monitor, consider MMSE  # Urinary Tract Infection, uncomplicated  - Resolved U/A showing + leukocytes and bacteria; previous UTI in July 214 was E. Coli sensitive to CTX. - completed CTX 3 Day course, Urine cx (E.Coli sensitive to CTX) - Patient to follow up with Urology on discharge - s/p Foley removal  FEN/GI: Clear liquid diet with supplement per nutrition, D5-1/2 NS 50cc/hr  Prophylaxis: SCDs, Lovenox 40mg  daily  Disposition:  Patient was transferred to regular floor 09/08/13. Expect about multiple day to 1 week recovery from surgery, pending clinical course improvement will continue to work on future placement plans with CSW, expect discharge to SNF - CSW following per discharge plans, contacted facilities for placement, pending update  Subjective: No acute overnight events. Yesterday tolerated OOB to recliner with significant assistance. Today resting in bed, tolerating clear liquid diet well, good appetite, states she feels better today. Endorses 3/10 generalized abdominal pain, controlled well with Morphine. Denies any nausea. Remains on 2L O2 [place].  Objective: Temp:  [98.1 F (36.7 C)-99.9 F (37.7 C)] 98.8 F (37.1 C) (12/16 0900) Pulse Rate:  [73-97] 89 (12/16 0900) Resp:  [17-18] 18 (  12/16 0900) BP: (146-176)/(60-80) 146/68 mmHg (12/16 0900) SpO2:  [91 %-99 %] 97 % (12/16 0900) Weight:  [165 lb 3.2 oz (74.934 kg)] 165 lb 3.2 oz (74.934 kg) (12/15 2004) Physical Exam: General - laying in bed resting, pleasant, in good spirits, interactive, daughter not at bedside, NAD.  Heart - RRR, S1/S2, no murmurs heard Lungs - CTAB, improved air movement b/l, some transmitted congestion sounds but no significant wheezing, crackles, or rhonchi heard. Abd - soft, non-distended, generalized tenderness (improved, mostly centrally located), noted midline surgical wound appears dry and intact. Ostomy stump with pink viable tissue, no sign of infection or ischemia, wounds dressed appropriately Neuro - No focal deficits. Awake, alert.  Laboratory:  Recent  Labs Lab 09/08/13 0440 09/10/13 0740 09/11/13 0837  WBC 14.0* 10.1 9.7  HGB 9.7* 10.8* 11.4*  HCT 30.2* 34.1* 35.5*  PLT 314 410* 530*    Recent Labs Lab 09/08/13 0440 09/10/13 0525 09/11/13 0700  NA 141 136 137  K 3.6 4.6 4.7  CL 109 103 102  CO2 23 23 24   BUN 7 4* 3*  CREATININE 0.74 0.70 0.67  CALCIUM 7.3* 8.1* 8.5  GLUCOSE 131* 131* 142*    HgbA1c 7.2  ProBNP 1298.0 (prior baseline 200s)  PT 13.5 / INR 1.05, APTT 28  12/7 UA Large leuks, neg nitrite, mod hgb, WBC TNTC, rare squam, many bacteria - suggestive of UTI  12/7 Urine Culture (E. Coli >100,000 CFU, sensitive to CTX)  Imaging/Diagnostic Tests:  12/7 Acute Abd / Chest Xray IMPRESSION:  No acute cardiopulmonary findings.  Air-filled small bowel loops and colon with mild distention possibly  due to gastroenteritis or ileus. No free air  12/7 CT Abd / Pelvis IMPRESSION:  1. There is distension of the right colon and transverse colon with  air-fluid level. Distended distal small bowel loops with air-fluid  level suspicious for ileus or partial small bowel obstruction. There  is a apple core thickening of the colonic wall in the splenic  flexure of the colon axial image 31 and 32 highly suspicious for  colonic malignancy. Further correlation with colonoscopy is  recommended.  2. No hydronephrosis or hydroureter.  12/9 Colonoscopy Obstructing descending colon mass. Biopsy positive for Adenocarcinoma.  12/11 2D ECHO - Left ventricle: Inferobasal hypokinesis The cavity size was normal. Wall thickness was increased in a pattern of mild LVH. Systolic function was normal. The estimated ejection fraction was in the range of 55% to 60%. Wall motion was normal; there were no regional wall motion abnormalities. - Aortic valve: Trivial regurgitation. - Mitral valve: Calcified annulus. - Atrial septum: There was increased thickness of the septum, consistent with lipomatous hypertrophy. No defect or  patent foramen ovale was identified.  12/11 Surgical Pathology (Omental Nodule, Colon) 1. Peritoneum, biopsy, Nodule - FAT NECROSIS WITH FIBROSIS. - NO EVIDENCE OF MALIGNANCY.  2. Colon, segmental resection for tumor, Splenic flexure - ADENOCARCINOMA EXTENDING INTO PERICOLONIC CONNECTIVE TISSUE. - MARGINS NOT INVOLVED. - FIVE BENIGN LYMPH NODES (0/5).  Saralyn Pilar, DO 09/11/2013, 12:51 PM PGY-1 [facility] Family Medicine FPTS Intern pager: [phone], text pages welcome

## 2013-09-12 LAB — GLUCOSE, CAPILLARY
Glucose-Capillary: 184 mg/dL — ABNORMAL HIGH (ref 70–99)
Glucose-Capillary: 140 mg/dL — ABNORMAL HIGH (ref 70–99)
Glucose-Capillary: 150 mg/dL — ABNORMAL HIGH (ref 70–99)
Glucose-Capillary: 125 mg/dL — ABNORMAL HIGH (ref 70–99)

## 2013-09-12 MED ORDER — LISINOPRIL 10 MG PO TABS
10.0000 mg | ORAL_TABLET | Freq: Every day | ORAL | Status: DC
  Administered 2013-09-12 – 2013-09-14 (×3): 10 mg via ORAL
  Filled 2013-09-12 (×3): qty 1

## 2013-09-12 MED ORDER — TIOTROPIUM BROMIDE MONOHYDRATE 18 MCG IN CAPS
18.0000 ug | ORAL_CAPSULE | Freq: Every day | RESPIRATORY_TRACT | Status: DC
  Administered 2013-09-13 – 2013-09-14 (×2): 18 ug via RESPIRATORY_TRACT
  Filled 2013-09-12: qty 5

## 2013-09-12 MED ORDER — ENSURE COMPLETE PO LIQD
237.0000 mL | Freq: Three times a day (TID) | ORAL | Status: DC
  Administered 2013-09-12 – 2013-09-14 (×6): 237 mL via ORAL

## 2013-09-12 MED ORDER — TIOTROPIUM BROMIDE MONOHYDRATE 18 MCG IN CAPS
18.0000 ug | ORAL_CAPSULE | Freq: Every day | RESPIRATORY_TRACT | Status: DC
  Filled 2013-09-12: qty 5

## 2013-09-12 MED ORDER — METOPROLOL TARTRATE 25 MG PO TABS
25.0000 mg | ORAL_TABLET | Freq: Two times a day (BID) | ORAL | Status: DC
  Administered 2013-09-12 – 2013-09-14 (×5): 25 mg via ORAL
  Filled 2013-09-12 (×6): qty 1

## 2013-09-12 NOTE — Clinical Social Work Note (Signed)
CSW contacted patient's daughter to give her bed offers for SNF placement. Ms. Ezzard Standing [phone]) explained that she does not work outside of the home, she provides 24/7 care for patient and that caring for her mom "is her job". She wants patient to discharge home when medically stable. CSW and daughter talked about [facility] services at d/c to include PT/OT.  CSW signing off as patient will d/c home with [facility] services. Please re-consult if CSW services needed.  Genelle Bal, MSW, LCSW [phone]

## 2013-09-12 NOTE — Progress Notes (Signed)
Patient examined and I agree with the assessment and plan  Violeta Gelinas, MD, MPH, FACS Pager: [phone]  09/12/2013 3:21 PM

## 2013-09-12 NOTE — Progress Notes (Signed)
FMTS Attending Note Patient seen by me, discussed with resident team and I agree with Dr Althea Charon' assessment and plan as per his note.  Advancing diet per Surgery recommendations. Paula Compton, MD

## 2013-09-12 NOTE — Evaluation (Signed)
Occupational Therapy Evaluation Patient Details Name: [name] MRN: [id number] DOB: 1929-10-01 Today's Date: 09/12/2013 Time: 7829-5621 OT Time Calculation (min): 18 min  OT Assessment / Plan / Recommendation History of present illness Pt is an 79 y/o female admitted with a SBO plus ileus, and was found to have an "apple-core lesion" on a CT scan. 12/11 underwent partial colectomy with colostomy for colon cancer. Remained intubated post-op.   Clinical Impression   Pt demos decline in function with ADLs and ADL mobility safety with decreased strength, balance and endurance. Pt would benefit from acute OT services to address impairments to increase level of function and safety    OT Assessment  Patient needs continued OT Services    Follow Up Recommendations  SNF;Supervision/Assistance - 24 hour    Barriers to Discharge   planning to d/c to SNF for short term rehab  Equipment Recommendations  None recommended by OT;Other (comment) (TBD at next venue of care)    Recommendations for Other Services    Frequency  Min 2X/week    Precautions / Restrictions Precautions Precautions: Fall Restrictions Weight Bearing Restrictions: No   Pertinent Vitals/Pain 7/10 abdominal area with mobility    ADL  Grooming: Performed;Wash/dry hands;Wash/dry face;Supervision/safety;Set up Where Assessed - Grooming: Supine, head of bed up Upper Body Bathing: Simulated;Moderate assistance Lower Body Bathing: +1 Total assistance;Simulated Upper Body Dressing: Performed;Moderate assistance Lower Body Dressing: +1 Total assistance Transfers/Ambulation Related to ADLs: transfers not addressed. Per PT notes, pt requires total A +2 ADL Comments: requires extensive assist with ADLs due to weakness and abdominal (surgical) pain    OT Diagnosis: Generalized weakness;Acute pain  OT Problem List: Decreased strength;Decreased knowledge of use of DME or AE;Decreased knowledge of precautions;Pain;Impaired  balance (sitting and/or standing);Decreased activity tolerance OT Treatment Interventions: Self-care/ADL training;Therapeutic exercise;Patient/family education;Neuromuscular education;Balance training;Therapeutic activities;DME and/or AE instruction   OT Goals(Current goals can be found in the care plan section) Acute Rehab OT Goals Patient Stated Goal: to go home OT Goal Formulation: With patient Time For Goal Achievement: 09/19/13 Potential to Achieve Goals: Good ADL Goals Pt Will Perform Grooming: with min assist;sitting Pt Will Perform Upper Body Bathing: with min assist;sitting Pt Will Perform Upper Body Dressing: with min assist;sitting Pt Will Transfer to Toilet: with total assist;with max assist;bedside commode Additional ADL Goal #1: Pt will complete bed mobility with max - mod A to sit EOB for ADLs adn grooming tasks  Visit Information  Last OT Received On: 09/12/13 Assistance Needed: +2 History of Present Illness: Pt is an 79 y/o female admitted with a SBO plus ileus, and was found to have an "apple-core lesion" on a CT scan. 12/11 underwent partial colectomy with colostomy for colon cancer. Remained intubated post-op.       Prior Functioning     Home Living Family/patient expects to be discharged to:: Skilled nursing facility Living Arrangements: Children Prior Function Level of Independence: Independent Communication Communication: No difficulties Dominant Hand: Right         Vision/Perception Vision - History Baseline Vision: Wears glasses only for reading Patient Visual Report: No change from baseline Perception Perception: Within Functional Limits   Cognition  Cognition Arousal/Alertness: Awake/alert Behavior During Therapy: WFL for tasks assessed/performed Overall Cognitive Status: Within Functional Limits for tasks assessed    Extremity/Trunk Assessment Upper Extremity Assessment Upper Extremity Assessment: Overall WFL for tasks  assessed;Generalized weakness Lower Extremity Assessment Lower Extremity Assessment: Defer to PT evaluation Cervical / Trunk Assessment Cervical / Trunk Assessment: Kyphotic  Mobility Bed Mobility Bed Mobility: Supine to Sit;Sitting - Scoot to [place] of Bed Rolling Right: 2: Max assist;1: +1 Total assist Sitting - Scoot to Edge of Bed: 2: Max assist Details for Bed Mobility Assistance: (A) to bring shoulders and trunk to/off EOB; pt anxious and fearful of pain; requires increased time and max cues for sequencing; use of draw pad to bring hips to EOB and feet supported on ground; pt fearful of falling   Transfers Transfers: Not assessed     Exercise     Balance Balance Balance Assessed: Yes Static Sitting Balance Static Sitting - Balance Support: Bilateral upper extremity supported;Feet unsupported Static Sitting - Level of Assistance: 5: Stand by assistance;4: Min assist Dynamic Sitting Balance Dynamic Sitting - Balance Support: Left upper extremity supported;No upper extremity supported;During functional activity;Feet unsupported Dynamic Sitting - Level of Assistance: 3: Mod assist   End of Session OT - End of Session Activity Tolerance: Patient limited by pain;Patient limited by fatigue Patient left: in bed;with call bell/phone within reach  GO     Galen Manila 09/12/2013, 2:03 PM

## 2013-09-12 NOTE — Progress Notes (Signed)
    Subjective:  Patient denies CP or dyspnea; continued abdominal pain but improving.  Objective:  Vital Signs in the last 24 hours: Temp:  [97.4 F (36.3 C)-99.3 F (37.4 C)] 99.2 F (37.3 C) (12/17 0501) Pulse Rate:  [74-95] 74 (12/17 0501) Resp:  [17-18] 18 (12/17 0501) BP: (133-151)/(63-93) 133/68 mmHg (12/17 0501) SpO2:  [97 %-98 %] 97 % (12/16 2213) Weight:  [164 lb 6.4 oz (74.571 kg)] 164 lb 6.4 oz (74.571 kg) (12/16 2213)  Intake/Output from previous day: 12/16 0701 - 12/17 0700 In: 920 [P.O.:720; I.V.:200] Out: 4 [Urine:4]  Physical Exam: Pt is alert elderly woman in NAD HEENT: normal Neck: supple Lungs: CTA bilaterally anteriorly CV: RRR without murmur or gallop Abd: s/p abd surgery Ext: no edema Skin: warm/dry no rash   Lab Results:  Recent Labs  09/10/13 0740 09/11/13 0837  WBC 10.1 9.7  HGB 10.8* 11.4*  PLT 410* 530*    Recent Labs  09/10/13 0525 09/11/13 0700  NA 136 137  K 4.6 4.7  CL 103 102  CO2 23 24  GLUCOSE 131* 142*  BUN 4* 3*  CREATININE 0.70 0.67    Cardiac Studies: 2-D echocardiogram: Study Conclusions  - Left ventricle: Inferobasal hypokinesis The cavity size was normal. Wall thickness was increased in a pattern of mild LVH. Systolic function was normal. The estimated ejection fraction was in the range of 55% to 60%. Wall motion was normal; there were no regional wall motion abnormalities. - Aortic valve: Trivial regurgitation. - Mitral valve: Calcified annulus. - Atrial septum: There was increased thickness of the septum, consistent with lipomatous hypertrophy. No defect or patent foramen ovale was identified.  Tele: Sinus rhythm with PACs and PVCs; PAF  Assessment/Plan:  1. Paroxysmal atrial fibrillation 2. Hypertension 3. Chronic diastolic heart failure  Patient in atrial fibrillation this AM; continue metoprolol but change to 25 BID; I would like to begin apixaban when OK with surgery. Continue to watch  volume status.  Olga Millers, M.D. 09/12/2013, 7:42 AM

## 2013-09-12 NOTE — Progress Notes (Signed)
4 Days Post-Op  Subjective: Very frail does not feel good, not sure why.  She is having some stool from her ostomy, bag broke and it is clear and clean now.  She is a hoyer lift to the chair and she only gets PT qod.    Objective: Vital signs in last 24 hours: Temp:  [97.4 F (36.3 C)-99.3 F (37.4 C)] 98.8 F (37.1 C) (12/17 0829) Pulse Rate:  [74-101] 101 (12/17 0829) Resp:  [17-19] 19 (12/17 0829) BP: (133-173)/(63-93) 173/81 mmHg (12/17 0829) SpO2:  [91 %-98 %] 91 % (12/17 0829) Weight:  [74.571 kg (164 lb 6.4 oz)] 74.571 kg (164 lb 6.4 oz) (12/16 2213) Last BM Date: 09/11/13 720 PO recorded, no stool recorded TM 99.2 BP and HR up at 8AM Labs OK yesterday Intake/Output from previous day: 12/16 0701 - 12/17 0700 In: 920 [P.O.:720; I.V.:200] Out: 4 [Urine:4] Intake/Output this shift:    General appearance: alert, cooperative and no distress Resp: clear to auscultation bilaterally GI: soft, tender, some bowel sounds, wound is a little red along staple line.  Colostomy looks pink.  Few BS, some gas in bag while I was there.  Lab Results:   Recent Labs  09/10/13 0740 09/11/13 0837  WBC 10.1 9.7  HGB 10.8* 11.4*  HCT 34.1* 35.5*  PLT 410* 530*    BMET  Recent Labs  09/10/13 0525 09/11/13 0700  NA 136 137  K 4.6 4.7  CL 103 102  CO2 23 24  GLUCOSE 131* 142*  BUN 4* 3*  CREATININE 0.70 0.67  CALCIUM 8.1* 8.5   PT/INR No results found for this basename: LABPROT, INR,  in the last 72 hours  No results found for this basename: AST, ALT, ALKPHOS, BILITOT, PROT, ALBUMIN,  in the last 168 hours   Lipase     Component Value Date/Time   LIPASE 19 09/02/2013 1220     Studies/Results: No results found.  Medications: . atorvastatin  40 mg Oral q1800  . clopidogrel  75 mg Oral Q breakfast  . enoxaparin (LOVENOX) injection  40 mg Subcutaneous Q24H  . feeding supplement (RESOURCE BREEZE)  1 Container Oral TID BM  . insulin aspart  0-15 Units Subcutaneous TID  WC  . insulin aspart  0-5 Units Subcutaneous QHS  . metoprolol tartrate  25 mg Oral BID  . pantoprazole  40 mg Oral Q2000  . senna-docusate  1 tablet Oral BID    Assessment/Plan Obstructing neoplasm of colon  S/p PARTIAL COLECTOMY WITH COLOSTOMY, 09/06/13. Dr. Darnell Level  Colostomy ischemia, with colostomy revision 09/08/13 Dr. Lindie Spruce  Chronic constipation UTI  PAF  COPD/tobacco use  AODM  Hx of CVA  Hyperlipidemia  Functional memory decline.   Plan:  Advance to full liquids then low fiber.  She has order for fulls, but just clears on her tray.  She needs dieticians help with PO's.  She is being referred to SNF after surgery.  It is very hard for her to even move on the bed. I am going to get an air mattress to help with skin care.   LOS: 10 days    Denessa Cavan 09/12/2013

## 2013-09-12 NOTE — Progress Notes (Signed)
Family Medicine Teaching Service Daily Progress Note Intern Pager: [phone]  Patient name: [name] Medical record number: [id number] Date of birth: 1930/06/21 Age: 79 y.o. Gender: female  Primary Care Provider: Kaleen Mask, MD Consultants: General Surgery, GI Code Status: Full  Pt Overview and Major Events to Date:  12/7 - CT Abd showing distended R-colon, ileus / partial SBO, "apple-core lesion" suspicious for malignancy 12/8 - c/s GI --> plan for colonoscopy 12/9 12/9 - scheduled colonoscopy, Gen surg cont to follow (awaiting results - aware of potential need for surgery) 12/10 - Colonoscopy (obstructing descending colon mass), tentative surgery 12/11 for resection pending cardiac clearance / medical stability 12/11 - ECHO normal EF, partial colon resection w/ colostomy, ICU o/n while on extubation 12/12 - POD #1. Extubated in ICU, plan to transfer to floor 12/13 - OR for ostomy revision (ischemia) 12/14 - NGT clamped 12/15 - NGT removed, advanced to clear liquid diet, PT-->SNF, CSW seeking placement offers 12/16 - Resume Plavix, remain clear liquid diet (no ostomy output), resume Lovenox DVT prophylaxis 12/17 - Full liquid diet, AFib today, inc Metoprolol 25 BID, consider Apixaban (if cleared per surgery?)  Assessment and Plan:  [name] is a 79 y.o. 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 PMH is significant for DM type 2, left sided CVA, paroxysmal atrial fibrillation, Chronic HF w/ preserved EF, and HTN.  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 Noted chart review h/o GI consult note in 2011 states that pt has a history of SBO. Currently pain appears to be improved and well controlled on fewer doses of Morphine. Appetite increased and tolerating clear liquids with intention to gradually advance, pending ostomy output. No BM yet, continue to follow - Pain Management: (DC'd Fentanyl)    - Morphine IV 2mg  q 3 hr PRN moderate pain (received x 3 doses 24 hrs) - Received x 0 today - PT (post-op) --> recommended SNF, (able to transfer pt to chair with significant assistance) - c/s General Surgery - greatly appreciate all recs     - POD# 6          - s/p partial colectomy with colostomy (adenocarcinoma removed).          - Required revision colostomy (OR 12/13) due to ischemia. Ostomy stump remains viable.          - advanced diet from clear to full liquids (noted ostomy output, improving ileus)     - WOC consult for ostomy care     - expect to remain in hospital until end of week for recovery     - plan for outpatient follow-up arrangements with [facility] (in approx 3 weeks)     - (09/06/13) Pathology Report - colon (adenocarcinoma with uninvolved margins, benign lymph nodes), Omental nodule (fat necrosis with fibrosis, No malignancy).     - CEA 1.4 (normal range) - c/s GI Deboraha Sprang) - greatly appreciate all recs      - 12/9 colonoscopy --> obstructing descending colon mass - identified as adenocarcinoma per biopsy  # Chronic Constipation, in setting of recent colectomy with ileus - bowel  regimen with Docusate-Senna, now that pt is taking PO  # Paroxysmal Atrial Fibrillation Patient denies known history and not on anticoagulation (CHADSVASC score 9); EKG showed A-fib on admission and present on multiple EKG's in 2013. History of GI bleeding in 2011, so no anticoagulation at this time (HAS Bled Score 5)  - Continue to monitor on telemetry, reviewed rhythm per telemetry (NSR, with regular p waves,  several PACs, PVCs with some irregularity, but no documented AFib per tele. - switch to Metoprolol PO 12.5mg  BID today - c/s Cardiology, greatly appreciate all recs   - increased Metoprolol to 25mg  PO BID   - f/u outpatient 2-4 weeks after DC, if stable from GI concerns   - consider starting Apixaban (d/t AFib, if cleared per surgery)  # Chronic HF, with preserved EF - Grade 1 diastolic dysfunction per Echo 1610 - ECHO normal EF (55-60%), mild LVH - added Lisinopril 10mg  daily  # COPD Hx of taking Spiriva and Albuterol, but currently taking no meds - Use albuterol inhaler PRN - added Spiriva daily - on 1-2L O2 via [place], monitor and titrate down as tolerated  # Inadequate oral intake 2/2 altered GI function Currently on clear liquid diet, considered advance to full liquid. Will remain on clear liquid for now. - Nutrition recommended changing resource breeze to Ensure Complete (maximize calories), continue to follow - Pre-albumin low 5.6  # Tobacco Abuse current daily smoke, pack years > 50  - Nicotine patch PRN  # Diabetes Mellitus - Hgb A1c 7.2 (09/03/13) - CBG's stable (100-120s). Now PO intake, change to CBGs q 4 hr AC, bedtime  - SSI Moderate - now resuming some PO intake  # Cerebrovascular Disease s/p left sided CVA 2008; History of taking Aggrenox but stopped in 2011 after upper GI bleed from multiple bleeding ulcers and started Plavix, but not currently taking any meds for this problem  - Given history of stroke, hold anti-coagulation until cleared per surgery - Plavix 75mg  daily, d/t stroke risk outweigh concerns of fall risk vs GI bleed  # HLD Previously taking simvastatin 40 mg, but no longer on statin at admission - Lipitor 40mg  daily  # Memory Deficits / Functional Decline - Pt admitted memory loss, not oriented to time; likely component of vascular dementia  - Continue to monitor  # Urinary Tract Infection, uncomplicated - Resolved U/A showing + leukocytes and  bacteria; previous UTI in July 214 was E. Coli sensitive to CTX. - completed CTX 3 Day course, Urine cx (E.Coli sensitive to CTX) - Patient to follow up with Urology on discharge  # Urinary Incontinence - s/p Foley removal - good UOP - minimal UOP charted, however significant UOP with incontinence currently using pads  FEN/GI: Full liquid diet with supplement per nutrition, D5-1/2 NS 50cc/hr  Prophylaxis: SCDs, Lovenox 40mg  daily  Disposition:  Patient was transferred to regular floor 09/08/13. Expect about multiple day to 1 week recovery from surgery, pending clinical course improvement will continue to work on future placement plans with CSW, expect discharge to SNF - CSW following per discharge plans, contacted facilities for placement, pending update  Subjective: No acute overnight events. Patient reports having good appetite and ready to increase to full liquid today, noted some increased ostomy output and ready to advance diet. Stable abdominal pain, reports as "soreness", but well controlled by Morphine. Her breathing has improved, and she has been able to cough up some mucus from before. Denies nausea, remains on O2.  Objective: Temp:  [  97.4 F (36.3 C)-99.3 F (37.4 C)] 99.2 F (37.3 C) (12/17 0501) Pulse Rate:  [74-95] 74 (12/17 0501) Resp:  [17-18] 18 (12/17 0501) BP: (133-151)/(63-93) 133/68 mmHg (12/17 0501) SpO2:  [97 %-98 %] 97 % (12/16 2213) Weight:  [164 lb 6.4 oz (74.571 kg)] 164 lb 6.4 oz (74.571 kg) (12/16 2213) Physical Exam: General - laying up in elevated bed with breakfast tray, pleasant, in good spirits, interactive, NAD.  Heart - RRR, S1/S2, no murmurs heard Lungs - CTAB, improved air movement b/l, no significant wheezing, crackles, or rhonchi heard. Abd - soft, non-distended, generalized tenderness (improved, mostly centrally located), noted midline surgical wound appears dry and intact. Ostomy stump with pink viable tissue, no sign of infection or  ischemia, wounds dressed appropriately, small amount of brown-yellow semi-solid stool in ostomy bag Neuro - No focal deficits. Awake, alert.  Laboratory:  Recent Labs Lab 09/08/13 0440 09/10/13 0740 09/11/13 0837  WBC 14.0* 10.1 9.7  HGB 9.7* 10.8* 11.4*  HCT 30.2* 34.1* 35.5*  PLT 314 410* 530*    Recent Labs Lab 09/08/13 0440 09/10/13 0525 09/11/13 0700  NA 141 136 137  K 3.6 4.6 4.7  CL 109 103 102  CO2 23 23 24   BUN 7 4* 3*  CREATININE 0.74 0.70 0.67  CALCIUM 7.3* 8.1* 8.5  GLUCOSE 131* 131* 142*    HgbA1c 7.2  ProBNP 1298.0 (prior baseline 200s)  PT 13.5 / INR 1.05, APTT 28  12/7 UA Large leuks, neg nitrite, mod hgb, WBC TNTC, rare squam, many bacteria - suggestive of UTI  12/7 Urine Culture (E. Coli >100,000 CFU, sensitive to CTX)  Imaging/Diagnostic Tests:  12/7 Acute Abd / Chest Xray IMPRESSION:  No acute cardiopulmonary findings.  Air-filled small bowel loops and colon with mild distention possibly  due to gastroenteritis or ileus. No free air  12/7 CT Abd / Pelvis IMPRESSION:  1. There is distension of the right colon and transverse colon with  air-fluid level. Distended distal small bowel loops with air-fluid  level suspicious for ileus or partial small bowel obstruction. There  is a apple core thickening of the colonic wall in the splenic  flexure of the colon axial image 31 and 32 highly suspicious for  colonic malignancy. Further correlation with colonoscopy is  recommended.  2. No hydronephrosis or hydroureter.  12/9 Colonoscopy Obstructing descending colon mass. Biopsy positive for Adenocarcinoma.  12/11 2D ECHO - Left ventricle: Inferobasal hypokinesis The cavity size was normal. Wall thickness was increased in a pattern of mild LVH. Systolic function was normal. The estimated ejection fraction was in the range of 55% to 60%. Wall motion was normal; there were no regional wall motion abnormalities. - Aortic valve: Trivial  regurgitation. - Mitral valve: Calcified annulus. - Atrial septum: There was increased thickness of the septum, consistent with lipomatous hypertrophy. No defect or patent foramen ovale was identified.  12/11 Surgical Pathology (Omental Nodule, Colon) 1. Peritoneum, biopsy, Nodule - FAT NECROSIS WITH FIBROSIS. - NO EVIDENCE OF MALIGNANCY.  2. Colon, segmental resection for tumor, Splenic flexure - ADENOCARCINOMA EXTENDING INTO PERICOLONIC CONNECTIVE TISSUE. - MARGINS NOT INVOLVED. - FIVE BENIGN LYMPH NODES (0/5).  Saralyn Pilar, DO 09/12/2013, 8:20 AM PGY-1 [facility] Family Medicine FPTS Intern pager: [phone], text pages welcome

## 2013-09-12 NOTE — Progress Notes (Signed)
NUTRITION FOLLOW-UP/CONSULT  DOCUMENTATION CODES Per approved criteria  -Not Applicable   INTERVENTION: Change Resource Breeze to Ensure Complete to maximize calorie provision. RD to continue to follow nutrition care plan.  NUTRITION DIAGNOSIS: Inadequate oral intake related to altered GI function as evidenced by limited oral intake.  Goal: Diet advancement as medically appropriate - met. Intake to meet >90% of estimated nutrition needs.  Monitor:  Labs, weight trend, intake and tolerance of diet, supplement tolerance  ASSESSMENT: Pt is an 79 y/o female admitted with a SBO plus ileus, and was found to have an "apple-core lesion" on a CT scan - pathology report reveals pt with adenocarcinoma. 12/11 underwent partial colectomy with colostomy for colon cancer.   Patient reports good intake of regular diet prior to admit.  Pt seen by surgery this morning. PA has advanced her to Full Liquids and then Low Fiber. RD consulted to "help with PO's." Pt is working with PT every other day, has difficulty moving. Air mattress ordered for skin integrity.  RD saw pt on 12/15. Pt ordered for Resource Breeze PO TID. Pt with Ensure Complete on table - states that she likes these. She reports that she is hungry and ready to try solid foods.  Planning to d/c to SNF once medically stable.  Prealbumin 5.6 - low.  Height: Ht Readings from Last 1 Encounters:  09/08/13 5\' 3"  (1.6 m)    Weight: Wt Readings from Last 1 Encounters:  09/11/13 164 lb 6.4 oz (74.571 kg)  Admit wt 165 lb - stable   BMI:  Body mass index is 29.13 kg/(m^2). Overweight  Estimated Nutritional Needs: Kcal: 1800-2000 Protein: 90-110 gm Fluid: maintain positive balance with ostomy  Diet Order: Fiber Restricted  EDUCATION NEEDS: -No education needs identified at this time   Intake/Output Summary (Last 24 hours) at 09/12/13 1053 Last data filed at 09/12/13 0900  Gross per 24 hour  Intake    920 ml  Output      3 ml   Net    917 ml   Skin: End colostomy in LUQ Stage I pressure ulcer to L buttocks Stage I pressure ulcer to L heel  Last BM: 12/16 via ostomy  Labs:   Recent Labs Lab 09/08/13 0440 09/10/13 0525 09/11/13 0700  NA 141 136 137  K 3.6 4.6 4.7  CL 109 103 102  CO2 23 23 24   BUN 7 4* 3*  CREATININE 0.74 0.70 0.67  CALCIUM 7.3* 8.1* 8.5  GLUCOSE 131* 131* 142*    CBG (last 3)   Recent Labs  09/11/13 1709 09/11/13 2211 09/12/13 0826  GLUCAP 157* 184* 140*   Prealbumin  Date/Time Value Range Status  09/11/2013  7:00 AM 5.6* 17.0 - 34.0 mg/dL Final     Performed at [facility]    Scheduled Meds: . atorvastatin  40 mg Oral q1800  . clopidogrel  75 mg Oral Q breakfast  . enoxaparin (LOVENOX) injection  40 mg Subcutaneous Q24H  . feeding supplement (RESOURCE BREEZE)  1 Container Oral TID BM  . insulin aspart  0-15 Units Subcutaneous TID WC  . insulin aspart  0-5 Units Subcutaneous QHS  . metoprolol tartrate  25 mg Oral BID  . pantoprazole  40 mg Oral Q2000  . senna-docusate  1 tablet Oral BID    Continuous Infusions: . dextrose 5 % and 0.45 % NaCl with KCl 40 mEq/L 50 mL/hr at 09/11/13 2007  . lactated ringers 20 mL/hr at 09/08/13 1151  Jarold Motto MS, RD, LDN Pager: [phone] After-hours pager: [phone]

## 2013-09-13 ENCOUNTER — Telehealth: Payer: Self-pay | Admitting: *Deleted

## 2013-09-13 DIAGNOSIS — M79609 Pain in unspecified limb: Secondary | ICD-10-CM

## 2013-09-13 LAB — GLUCOSE, CAPILLARY
Glucose-Capillary: 172 mg/dL — ABNORMAL HIGH (ref 70–99)
Glucose-Capillary: 125 mg/dL — ABNORMAL HIGH (ref 70–99)
Glucose-Capillary: 128 mg/dL — ABNORMAL HIGH (ref 70–99)

## 2013-09-13 NOTE — Progress Notes (Signed)
Pt returned to unit from vascular. Pt went down on RA, returned with SpO2 at 100% on room air. Left oxygen off. Will continue to monitor. Marisue Ivan with CMT notified of return.

## 2013-09-13 NOTE — Discharge Summary (Signed)
Family Medicine Teaching [facility] Discharge Summary  Patient name: [name] Medical record number: [id number] Date of birth: 15-Dec-1929 Age: 79 y.o. Gender: female Date of Admission: 09/02/2013  Date of Discharge: 09/14/13 Admitting Physician: Tobey Grim, MD  Primary Care Provider: Kaleen Mask, MD Consultants: General Surgery, GI, Cardiology  Indication for Hospitalization: Abdominal Pain, Vomiting (unable to take PO), large bowel obstructing mass  Discharge Diagnoses/Problem List:  Adenocarcinoma, colon at splenic flexure - Removed, s/p Partial Colectomy w/ colostomy (09/06/13) Partial Small Bowel Obstruction, secondary to Adenocarcinoma - Resolved Failure to extubate with Acute Respiratory Failure, secondary to Anesthesia - Resolved Colostomy Ischemia - Resolved, s/p colostomy revision (09/08/13) Chronic Constipation, in setting of colectomy with post-op ileus - Improved Paroxysmal Atrial Fibrillation Chronic Diastolic HF, preserved EF COPD, secondary to h/o tobacco abuse H/o CVA T2DM HLD Inadequate Oral Intake 2/2 altered GI function - Improved UTI, uncomplicated - Resolved Functional memory decline, suspected secondary to vascular dementia  Disposition: Home with [facility]  Discharge Condition: Stable  Brief Hospital Course:  [name] is a 79 y.o. female who presented with abdominal pain, nausea and vomiting consistent with ileus and with bowel obstruction, found to have large obstructing adenocarcinoma at splenic flexure. PMH is significant for DM type 2, left sided CVA, paroxysmal atrial fibrillation, Chronic HF w/ preserved EF, COPD, and HTN.  # Adenocarcinoma, colon at splenic flexure - Removed, s/p Partial Colectomy w/ colostomy (09/06/13) # Partial Small Bowel Obstruction, secondary to Adenocarcinoma - Resolved # Failure to extubate with Acute Respiratory Failure, secondary to Anesthesia - Resolved # Colostomy Ischemia - Resolved, s/p  colostomy revision (09/08/13) On admission, presented with R>L abdominal pain and distention, mild leukocytosis 14.5, afebrile, abdomen determined to be non-acute, h/o prior SBO with risk factors including prior abdominal hysterectomy 1980. Placed on NGT, and patient was able to pass gas / BM with noted improvement. Initial work-up of abdominal pain with CT demonstrated Ileus/partial SBO of small bowel as well as ileus of colon with thickening of colon at splenic flexure, "apple-core lesion" concerning for malignant mass noted on CT scan. CEA 1.4 (normal). Consulted GI and General Surgery for continued management. GI performed colonoscopy (09/04/13) showed obstructing descending colon mass (adenocarcinoma per biopsy). Cleared for surgery per Cardiology (repeat ECHO with normal EF, PAF). General Surgery performed partial colectomy with colostomy, noted resection of mass (confirm adenocarcinoma, see path report below) also peritoneal nodule (No evidence of malignancy). Following surgery, patient unable to be extubated post-op (acute respiratory failure secondary to anesthesia), transferred to ICU overnight and tolerated extubation well the following day. Within 48 hours post-op, General Surgery decided to return to OR for revision of ostomy site due to localized ischemia, revision completed (09/08/13), and ostomy has remained viable since. Post-op recovery uncomplicated, Morphine IV for pain control, NGT removed, diet advanced quickly from clear liquids to low fiber, Nutrition consulted for supplementation. Appropriate function of ostomy with significant gas and stool noted. Wound Care consulted for continued ostomy management and education. Patient medically stabilized post-op, PT/OT initially recommended SNF, patient/family decided for home with continued [facility] for rehab.  # Chronic Constipation, in setting of colectomy with post-op ileus - Improved See above course. Continued bowel regimen with Docusate-Senna.  Improved with continued flatus and BM post-op.  # Paroxysmal Atrial Fibrillation  Patient denies known history of Atrial Fib and not on anticoagulation (CHADSVASC score 9); EKG showed A-fib on admission and present on multiple EKG's in 2013. History of GI bleeding in 2011, so anticoagulation  was not initiated at this time [name] Score 5). Patient was continuously monitored on telemetry, reviewed rhythm per telemetry (NSR, with regular p waves, several PACs, PVCs with some irregularity). Per Cardiology review, patient was in Paroxysmal Atrial Fib on 09/12/13, and has since returned to NSR. Low dose beta-blocker IV initiated prior to surgery, and transitioned to Metoprolol PO post-op, increased to 25mg  BID. Cardiology recommends apixaban for anti-coagulation and to follow-up 2-4 weeks outpatient.   # Chronic HF, with preserved EF Grade 1 diastolic dysfunction per Echo 1610. Clinically not volume overloaded on admission. Cardiology pre-operative clearance work-up included ECHO (normal EF 55-60%, mild LVH). Medically managed post-op with ACEi and Beta-blocker.  # COPD, secondary to h/o Tobacco Abuse  Chronic history of smoking >50 pack years, previously daily smoker prior to hospitalization. Required nicotine patch PRN. Hx of taking Spiriva and Albuterol, but currently taking no meds on admission. Respiratory status stable pre-op and post-op. Continued Albuterol PRN with added Spiriva daily.  # Inadequate oral intake 2/2 altered GI function - Improved Due to abdominal pain and vomiting with partial SBO on admission, presented with very poor PO intake. Post-op diet advanced quickly from clear liquids. Nutrition consulted for dietary supplementation recommended Ensure Complete, given low pre-albumin 5.6. Tolerating low fiber diet well on discharge.  # Type 2 Diabetes Mellitus  Hgb A1c 7.2 (09/03/13), CBG's stable (100-170s). Previously no medications or insulin for DM prior to hospitalization. Following  increased PO intake continued on Moderate SSI, currently stable. If increased CBGs can consider low dose basal insulin with Lantus.  # History of Cerebrovascular Disease  s/p left sided CVA 2008; History of taking Aggrenox but stopped in 2011 after upper GI bleed from multiple bleeding ulcers and started Plavix, however within past few years has not taken any anti-coagulation meds. Discussed importance of anti-coagulation given stroke h/o, resumed Plavix 75mg  daily as benefit outweighs risk in this patient.  # HLD  Previously taking simvastatin 40 mg, but no longer on statin at admission. Started Lipitor 40mg  daily.  # Memory Deficits / Functional Decline, secondary to suspected vascular dementia Reported history of chronic memory loss. Suspect component of vascular dementia given history of stroke and PAF.  # Urinary Tract Infection, uncomplicated - Resolved  U/A showing + leukocytes and bacteria; previous UTI in July 214 was E. Coli sensitive to CTX. Current Urine culture positive for E.Coli sensitive to CTX, and completed CTX course. Prior to hospitalization, patient had scheduled new patient appointment with [facility] (frequent UTI, thickening bladder wall on recent US, urinary incontinence). Spoke with [facility] regarding missed appointment due to being in hospital, and plan to re-schedule on discharge.   # Urinary Incontinence  s/p Foley removal, continued good UOP with incontinence mainly due to inability to move out of bed, currently wearing pads. Important to continue Zinc-Oxide based barrier cream d/t incontinence.  Issues for Follow Up:   1. Continued WOC Ostomy Management - Additionally, would recommend Low-Air Loss Mattress at home to assist with moisture management and pressure redistribution since this patient will be at high risk for skin breakdown. Note apply Zinc-Oxide barrier cream d/t urinary incontinence.  2. Continued HH PT/OT for short-term rehab -  Significant functional decline and deconditioning prior to hospitalization, require continued PT/OT to determine future functional goals and placement.  3. Oncology Follow-up at [facility] and initiated arrangements for continued outpatient follow-up in patient with newly diagnosed adenocarcinoma of colon (s/p surgical removal). Surgery recommended follow-up  within 3 weeks of hospitalization.   4. Re-schedule New Patient Visit - [facility] - Missed appointment during recent hospitalization. Appointment originally scheduled by PCP after recent Ultrasound (06/05/13) showed irregular soft-tissue thickening on posterior aspect of bladder (cannot rule out lesions), recommended Urology consultation. Additionally history of frequent UTIs, urinary incontinence. Please call [facility] to re-schedule appointment.  5. Paroxysmal Atrial Fibrillation - Start Anti-coagulation - Cardiology recommended starting anti-coagulation with Apixaban for PAF, pending surgical clearance. Noted concerns of fall risk and h/o GI bleed, in addition to recent abdominal surgery. However, we believe benefit of anti-coagulation for recurrent CVA prevention outweigh risks.  Significant Procedures:  Partial Colectomy with Colostomy, removal of colon adenocarcinoma  (Dr. Gerrit Friends, 09/06/13) Colostomy ischemia with revision (Dr. Lindie Spruce, 09/08/13)  Significant Labs and Imaging:   Recent Labs Lab 09/10/13 0740 09/11/13 0837 09/14/13 0600  WBC 10.1 9.7 12.1*  HGB 10.8* 11.4* 11.9*  HCT 34.1* 35.5* 38.1  PLT 410* 530* 548*    Recent Labs Lab 09/08/13 0440 09/10/13 0525 09/11/13 0700 09/14/13 0600  NA 141 136 137 138  K 3.6 4.6 4.7 4.6  CL 109 103 102 103  CO2 23 23 24 25   GLUCOSE 131* 131* 142* 144*  BUN 7 4* 3* 6  CREATININE 0.74 0.70 0.67 0.72  CALCIUM 7.3* 8.1* 8.5 8.6    HgbA1c 7.2  ProBNP 1298.0 (prior baseline 200s)  PT 13.5 / INR 1.05, APTT 28  12/7 UA  Large leuks, neg  nitrite, mod hgb, WBC TNTC, rare squam, many bacteria - suggestive of UTI  12/7 Urine Culture (E. Coli >100,000 CFU, sensitive to CTX)  Imaging/Diagnostic Tests:  12/7 Acute Abd / Chest Xray  IMPRESSION:  No acute cardiopulmonary findings.  Air-filled small bowel loops and colon with mild distention possibly  due to gastroenteritis or ileus. No free air  12/7 CT Abd / Pelvis  IMPRESSION:  1. There is distension of the right colon and transverse colon with  air-fluid level. Distended distal small bowel loops with air-fluid  level suspicious for ileus or partial small bowel obstruction. There  is a apple core thickening of the colonic wall in the splenic  flexure of the colon axial image 31 and 32 highly suspicious for  colonic malignancy. Further correlation with colonoscopy is  recommended.  2. No hydronephrosis or hydroureter.  12/9 Colonoscopy  Obstructing descending colon mass. Biopsy positive for Adenocarcinoma.  12/11 2D ECHO  - Left ventricle: Inferobasal hypokinesis The cavity size was normal. Wall thickness was increased in a pattern of mild LVH. Systolic function was normal. The estimated ejection fraction was in the range of 55% to 60%. Wall motion was normal; there were no regional wall motion abnormalities. - Aortic valve: Trivial regurgitation. - Mitral valve: Calcified annulus. - Atrial septum: There was increased thickness of the septum, consistent with lipomatous hypertrophy. No defect or patent foramen ovale was identified.  12/11 Surgical Pathology (Omental Nodule, Colon)  1. Peritoneum, biopsy, Nodule  - FAT NECROSIS WITH FIBROSIS.  - NO EVIDENCE OF MALIGNANCY.  2. Colon, segmental resection for tumor, Splenic flexure  - ADENOCARCINOMA EXTENDING INTO PERICOLONIC CONNECTIVE TISSUE.  - MARGINS NOT INVOLVED.  - FIVE BENIGN LYMPH NODES (0/5).  Results/Tests Pending at Time of Discharge: None  Discharge Medications:    Medication List          atorvastatin 40 MG tablet  Commonly known as:  LIPITOR  Take 1 tablet (40 mg total) by mouth daily at 6 PM.  clopidogrel 75 MG tablet  Commonly known as:  PLAVIX  Take 1 tablet (75 mg total) by mouth daily with breakfast.     feeding supplement (ENSURE COMPLETE) Liqd  Take 237 mLs by mouth 3 (three) times daily between meals.     lisinopril 10 MG tablet  Commonly known as:  PRINIVIL,ZESTRIL  Take 1 tablet (10 mg total) by mouth daily.     metoprolol tartrate 25 MG tablet  Commonly known as:  LOPRESSOR  Take 1 tablet (25 mg total) by mouth 2 (two) times daily.     morphine 30 MG 12 hr tablet  Commonly known as:  MS CONTIN  Take 30 mg by mouth 2 (two) times daily.     nitroGLYCERIN 0.4 MG SL tablet  Commonly known as:  NITROSTAT  Place 0.4 mg under the tongue every 5 (five) minutes as needed for chest pain.     nortriptyline 25 MG capsule  Commonly known as:  PAMELOR  Take 100 mg by mouth at bedtime.     senna-docusate 8.6-50 MG per tablet  Commonly known as:  Senokot-S  Take 1 tablet by mouth 2 (two) times daily.     STOOL SOFTENER PO  Take 2 capsules by mouth daily as needed. For constipation     sulfamethoxazole-trimethoprim 800-160 MG per tablet  Commonly known as:  BACTRIM DS,SEPTRA DS  Take 1 tablet by mouth 2 (two) times daily. For 14 days     tiotropium 18 MCG inhalation capsule  Commonly known as:  SPIRIVA  Place 1 capsule (18 mcg total) into inhaler and inhale daily.     traMADol 50 MG tablet  Commonly known as:  ULTRAM  Take 100 mg by mouth 2 (two) times daily.     traZODone 150 MG tablet  Commonly known as:  DESYREL  Take 150 mg by mouth at bedtime.        Discharge Instructions: Please refer to Patient Instructions section of EMR for full details.  Patient was counseled important signs and symptoms that should prompt return to medical care, changes in medications, dietary instructions, activity restrictions, and follow up appointments.    Follow-Up Appointments: Follow-up Information   Follow up with Kaleen Mask, MD. Schedule an appointment as soon as possible for a visit in 3 days.   Specialty:  Family Medicine   Contact information:   [address] [place] [address] [phone]       Follow up with [facility] In 1 week.   Contact information:   [address] [place] [address] [phone]      Follow up with Olga Millers, MD. Schedule an appointment as soon as possible for a visit in 2 weeks.   Specialty:  Cardiology   Contact information:   [address] [place] [address] [phone]       Follow up with Velora Heckler, MD In 2 weeks. (THE OFFICE SHOULD CALL YOU WITH YOUR APPOINTMENT TIME AND DATE)    Specialty:  General Surgery   Contact information:   [address] [place] [address] [phone]       Saralyn Pilar, DO 09/14/2013, 9:36 PM PGY-1, [facility]

## 2013-09-13 NOTE — Progress Notes (Signed)
FMTS Attending NOte Patient's care discussed with resident team, and I agree with Dr Cherre Huger assessment and plan for today.  Paula Compton, MD

## 2013-09-13 NOTE — Progress Notes (Signed)
Per pt it is ok to give information regarding patient status over the phone to daughter. Daughter able to give password. Daughter updated on condition and care plan. Will continue to monitor.

## 2013-09-13 NOTE — Progress Notes (Signed)
5 Days Post-Op  Subjective: Tolerating diet well, mild soreness  Objective: Vital signs in last 24 hours: Temp:  [98.6 F (37 C)-99.1 F (37.3 C)] 98.9 F (37.2 C) (12/18 0420) Pulse Rate:  [63-106] 83 (12/18 0420) Resp:  [17-18] 17 (12/18 0420) BP: (108-149)/(56-93) 121/56 mmHg (12/18 0420) SpO2:  [96 %-100 %] 100 % (12/18 0420) Weight:  [160 lb 3.2 oz (72.666 kg)] 160 lb 3.2 oz (72.666 kg) (12/17 2121) Last BM Date: 09/12/13  Intake/Output from previous day: 12/17 0701 - 12/18 0700 In: 1480 [P.O.:480; I.V.:1000] Out: 80 [Urine:5; Stool:75] Intake/Output this shift:    General appearance: alert and cooperative Resp: clear to auscultation bilaterally GI: soft, ostomy pink with lquid stool output, incision CDI, +BS  Lab Results:   Recent Labs  09/11/13 0837  WBC 9.7  HGB 11.4*  HCT 35.5*  PLT 530*   BMET  Recent Labs  09/11/13 0700  NA 137  K 4.7  CL 102  CO2 24  GLUCOSE 142*  BUN 3*  CREATININE 0.67  CALCIUM 8.5   PT/INR No results found for this basename: LABPROT, INR,  in the last 72 hours ABG No results found for this basename: PHART, PCO2, PO2, HCO3,  in the last 72 hours  Studies/Results: No results found.  Anti-infectives: Anti-infectives   Start     Dose/Rate Route Frequency Ordered Stop   09/08/13 1214  dextrose 5 % with cefOXitin (MEFOXIN) ADS Med    Comments:  Alanda Amass   : cabinet override      09/08/13 1214 09/08/13 1333   09/06/13 0600  ertapenem (INVANZ) 1 g in sodium chloride 0.9 % 50 mL IVPB     1 g 100 mL/hr over 30 Minutes Intravenous On call to O.R. 09/05/13 1417 09/06/13 1242   09/03/13 1400  cefTRIAXone (ROCEPHIN) 1 g in dextrose 5 % 50 mL IVPB  Status:  Discontinued     1 g 100 mL/hr over 30 Minutes Intravenous Every 24 hours 09/02/13 1916 09/06/13 1836   09/02/13 1345  cefTRIAXone (ROCEPHIN) 1 g in dextrose 5 % 50 mL IVPB     1 g 100 mL/hr over 30 Minutes Intravenous  Once 09/02/13 1335 09/02/13 1643       Assessment/Plan: s/p Procedure(s): COLOSTOMY REVISION (N/A) S/P colectomy/colostomy then colostomy revision - ostomy looks good, tolerating diet Deconditioning - therapies and will likely need SNF rehab  LOS: 11 days    [name] 09/13/2013

## 2013-09-13 NOTE — Progress Notes (Signed)
    Subjective:  Patient denies CP or dyspnea; continued abdominal pain but improving.  Objective:  Vital Signs in the last 24 hours: Temp:  [98.6 F (37 C)-99.1 F (37.3 C)] 98.9 F (37.2 C) (12/18 0420) Pulse Rate:  [63-106] 83 (12/18 0420) Resp:  [17-19] 17 (12/18 0420) BP: (108-173)/(56-93) 121/56 mmHg (12/18 0420) SpO2:  [91 %-100 %] 100 % (12/18 0420) Weight:  [160 lb 3.2 oz (72.666 kg)] 160 lb 3.2 oz (72.666 kg) (12/17 2121)  Intake/Output from previous day: 12/17 0701 - 12/18 0700 In: 1020 [P.O.:420; I.V.:600] Out: 4 [Urine:4]  Physical Exam: Pt is alert elderly woman in NAD HEENT: normal Neck: supple Lungs: CTA bilaterally anteriorly CV: RRR without murmur or gallop Abd: s/p abd surgery Ext: no edema Skin: warm/dry no rash   Lab Results:  Recent Labs  09/10/13 0740 09/11/13 0837  WBC 10.1 9.7  HGB 10.8* 11.4*  PLT 410* 530*    Recent Labs  09/11/13 0700  NA 137  K 4.7  CL 102  CO2 24  GLUCOSE 142*  BUN 3*  CREATININE 0.67    Cardiac Studies: 2-D echocardiogram: Study Conclusions  - Left ventricle: Inferobasal hypokinesis The cavity size was normal. Wall thickness was increased in a pattern of mild LVH. Systolic function was normal. The estimated ejection fraction was in the range of 55% to 60%. Wall motion was normal; there were no regional wall motion abnormalities. - Aortic valve: Trivial regurgitation. - Mitral valve: Calcified annulus. - Atrial septum: There was increased thickness of the septum, consistent with lipomatous hypertrophy. No defect or patent foramen ovale was identified.  Tele: Sinus rhythm this AM  Assessment/Plan:  1. Paroxysmal atrial fibrillation 2. Hypertension 3. Chronic diastolic heart failure  Patient back in sinus this AM; continue metoprolol 25 BID; I would like to begin apixaban when OK with surgery. Continue to watch volume status.  Olga Millers, M.D. 09/13/2013, 7:33 AM

## 2013-09-13 NOTE — Clinical Social Work Note (Addendum)
CSW advised by physical therapist [place] that daughter in room during session and was asked to demonstrate an activity with patient and could not. Daughter and patient expressed agreement to SNF for short-term rehab.  CSW visited room and spoke with patient and daughter. Patient unable to participate actively in conversation as she was in a lot of pain, however daughter expressed agreement for patient to go to rehab for a week or so. CSW offered support and explained that importance of allowing patient to get full benefit of therapy before going home. Patient expressed understanding that care giving is difficult and rehab would help her. Daughter gave facility preference as [facility]. CSW provided phone number for daughter to call if any questions.  CSW visited room later at daughter's request and informed that patient will be going home and [facility] services requested. CSW advised nurse case manager [facility] regarding daughter/patient decision. CSW signing off.  Genelle Bal, MSW, LCSW [phone]

## 2013-09-13 NOTE — Progress Notes (Signed)
Pt to vascular lab, notified CMT alicia.

## 2013-09-13 NOTE — Care Management Note (Signed)
   CARE MANAGEMENT NOTE 09/13/2013  Patient:  [name], [name]   Account Number:  [id number]  Date Initiated:  09/07/2013  Documentation initiated by:  [facility]  Subjective/Objective Assessment:   found rectal CA - surgery on 12-11 - post op on vent admitted to ICU.     Action/Plan:   09/13/13 Multiple changes by pt daughter, while PT recommending SNF, pt and daughter have decided on SNF then back to home with [facility]. Both selected [facility] for HH.   Anticipated DC Date:  09/13/2013   Anticipated DC Plan:  HOME W HOME HEALTH SERVICES      DC Planning Services  CM consult      Choice offered to / List presented to:          [facility] arranged  HH-1 RN  HH-2 PT      [facility] agency  [facility]   Status of service:  Completed, signed off Medicare Important Message given?   (If response is "NO", the following Medicare IM given date fields will be blank) Date Medicare IM given:   Date Additional Medicare IM given:    Discharge Disposition:  HOME W HOME HEALTH SERVICES  Per UR Regulation:  Reviewed for med. necessity/level of care/duration of stay  If discussed at Long Length of Stay Meetings, dates discussed:    Comments:  ContactWilliam Hamburger Daughter [phone] 09/13/2013  Ongoing efforts by CSW to arrange short term SNF , which is recommended by PT, daughter was unable to demonstrate that she can assist pt OOB. However both state that they plan to go home. CM met with pt and daughter and they have used [facility] services previously and wish to use that agency again. Pt has walker at home and 3:1 per daughter. Son-in-law of daughter will provide transportation. Pt will need to wean from oxygen, currently at 1l/min, and currently awaiting leg doppler due to leg pain. [facility] notifed of d/c needs. Johny Shock RN MPH, case manager, [phone]  09/12/2013  [address] RN, [place]  [phone] advancing diet to low fiber plan discharge to SNF  09-07-13 12:15pm Avie Arenas, RNBSN -  [phone] New colostomy - will need support for f/u.  Caregiver is daughter.

## 2013-09-13 NOTE — Progress Notes (Signed)
Pt complaining of new onset right leg pain, tender to touch. Pt is unable to describe pain, "it just hurts." MD made aware at [phone] will continue to monitor.

## 2013-09-13 NOTE — Progress Notes (Signed)
Family Medicine Teaching Service Daily Progress Note Intern Pager: [phone]  Patient name: [name] Medical record number: [id number] Date of birth: October 10, 1929 Age: 79 y.o. Gender: female  Primary Care Provider: Kaleen Mask, MD Consultants: General Surgery, GI Code Status: Full  Pt Overview and Major Events to Date:  12/7 - CT Abd showing distended R-colon, ileus / partial SBO, "apple-core lesion" suspicious for malignancy 12/8 - c/s GI --> plan for colonoscopy 12/9 12/9 - scheduled colonoscopy, Gen surg cont to follow (awaiting results - aware of potential need for surgery) 12/10 - Colonoscopy (obstructing descending colon mass), tentative surgery 12/11 for resection pending cardiac clearance / medical stability 12/11 - ECHO normal EF, partial colon resection w/ colostomy, ICU o/n while on extubation 12/12 - POD #1. Extubated in ICU, plan to transfer to floor 12/13 - OR for ostomy revision (ischemia) 12/14 - NGT clamped 12/15 - NGT removed, advanced to clear liquid diet, PT-->SNF, CSW seeking placement offers 12/16 - Resume Plavix, remain clear liquid diet (no ostomy output), resume Lovenox DVT prophylaxis 12/17 - Full liquid diet, AFib today, inc Metoprolol 25 BID, consider Apixaban (if cleared per surgery?)  Assessment and Plan: [name] is a 79 y.o. 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 PMH is significant for DM type 2, left sided CVA, paroxysmal atrial fibrillation, Chronic HF w/ preserved EF, and HTN.  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 Noted chart review h/o GI consult note in 2011 states that pt has a history of SBO. Currently pain appears to be improved and well controlled on fewer doses of Morphine. Appetite increased and tolerating clear liquids with intention to gradually advance, pending ostomy output. No BM yet, continue to follow - Pain Management: (DC'd Fentanyl)    - Morphine IV 2mg  q 3 hr PRN moderate pain (received x 3 doses 24 hrs) - Received x 0 today - PT (post-op) --> recommended SNF, (able to transfer pt to chair with significant assistance) - c/s General Surgery - greatly appreciate all recs     - POD# 6          - s/p partial colectomy with colostomy (adenocarcinoma removed).          - Required revision colostomy (OR 12/13) due to ischemia. Ostomy stump remains viable.          - advanced diet from full liquids to low fiber     - WOC consult for ostomy care     - expect to remain in hospital until end of week for recovery     - plan for outpatient follow-up arrangements with [facility] (in approx 3 weeks)     - (09/06/13) Pathology Report - colon (adenocarcinoma with uninvolved margins, benign lymph nodes), Omental nodule (fat necrosis with fibrosis, No malignancy).     - CEA 1.4 (normal range) - c/s GI Deboraha Sprang) - greatly appreciate all recs      - 12/9 colonoscopy --> obstructing descending colon mass - identified as adenocarcinoma per biopsy  # Chronic Constipation, in setting of recent colectomy with ileus - bowel regimen with Docusate-Senna, now that  pt is taking PO  # Paroxysmal Atrial Fibrillation Patient denies known history and not on anticoagulation (CHADSVASC score 9); EKG showed A-fib on admission and present on multiple EKG's in 2013. History of GI bleeding in 2011, so no anticoagulation at this time (HAS Bled Score 5)  - Continue to monitor on telemetry, reviewed rhythm per telemetry (NSR, with regular p waves, several PACs, PVCs with some  irregularity, but no documented AFib per tele. - switch to Metoprolol PO 12.5mg  BID today - c/s Cardiology, greatly appreciate all recs   - continue Metoprolol 25mg  PO BID   - f/u outpatient 2-4 weeks after DC, if stable from GI concerns   - consider starting Apixaban (d/t AFib, if cleared per surgery)  # Chronic HF, with preserved EF - Grade 1 diastolic dysfunction per Echo 1914 - ECHO normal EF (55-60%), mild LVH - Lisinopril 10mg  daily  # COPD Hx of taking Spiriva and Albuterol, but currently taking no meds - Use albuterol inhaler PRN - Spiriva daily - on 1-2L O2 via [place], monitor and titrate down as tolerated  # Inadequate oral intake 2/2 altered GI function Currently on clear liquid diet, considered advance to full liquid. Will remain on clear liquid for now. - Nutrition recommended changing resource breeze to Ensure Complete (maximize calories), continue to follow - Pre-albumin low 5.6  # Tobacco Abuse current daily smoke, pack years > 50  - Nicotine patch PRN  # Diabetes Mellitus - Hgb A1c 7.2 (09/03/13) - CBG's stable (100-120s). Now PO intake, change to CBGs q 4 hr AC, bedtime  - SSI Moderate - now resuming some PO intake  # Cerebrovascular Disease s/p left sided CVA 2008; History of taking Aggrenox but stopped in 2011 after upper GI bleed from multiple bleeding ulcers and started Plavix, but not currently taking any meds for this problem  - Given history of stroke, hold anti-coagulation until cleared per surgery - Plavix 75mg  daily, d/t stroke risk outweigh concerns of fall risk vs GI bleed  # HLD Previously taking simvastatin 40 mg, but no longer on statin at admission - Lipitor 40mg  daily  # Memory Deficits / Functional Decline - Pt admitted memory loss, not oriented to time; likely component of vascular dementia  - Continue to monitor  # Urinary Tract Infection, uncomplicated - Resolved U/A showing + leukocytes and bacteria; previous UTI in July 214 was E. Coli  sensitive to CTX. - completed CTX 3 Day course, Urine cx (E.Coli sensitive to CTX) - Patient to follow up with Urology on discharge  # Urinary Incontinence - s/p Foley removal - good UOP - minimal UOP charted, however significant UOP with incontinence currently using pads  FEN/GI: Full liquid diet with supplement per nutrition, D5-1/2 NS 50cc/hr  Prophylaxis: SCDs, Lovenox 40mg  daily  Disposition: home when O2 weaned, pt refusing SNF, HHPT being set up for likely d/c tomorrow  Subjective: No acute overnight events. Reports some pain this morning but not new. Nurse called later to report severe right leg pain.  Objective: Temp:  [98.6 F (37 C)-99.1 F (37.3 C)] 98.9 F (37.2 C) (12/18 0420) Pulse Rate:  [63-106] 83 (12/18 0420) Resp:  [17-19] 17 (12/18 0420) BP: (108-173)/(56-93) 121/56 mmHg (12/18 0420) SpO2:  [91 %-100 %] 100 % (12/18 0420) Weight:  [160 lb 3.2 oz (72.666 kg)] 160 lb 3.2 oz (72.666 kg) (12/17 2121) Physical Exam: General - laying up in elevated bed with breakfast tray, pleasant, in good spirits, interactive, NAD.  Heart - RRR,  S1/S2, no murmurs heard Lungs - CTAB, improved air movement b/l, no significant wheezing, crackles, or rhonchi heard. Abd - soft, non-distended, generalized tenderness (improved, mostly centrally located), noted midline surgical wound appears dry and intact. Ostomy stump with pink viable tissue, no sign of infection or ischemia, wounds dressed appropriately, small amount of brown-yellow semi-solid stool in ostomy bag Neuro - No focal deficits. Awake, alert.  Laboratory:  Recent Labs Lab 09/08/13 0440 09/10/13 0740 09/11/13 0837  WBC 14.0* 10.1 9.7  HGB 9.7* 10.8* 11.4*  HCT 30.2* 34.1* 35.5*  PLT 314 410* 530*    Recent Labs Lab 09/08/13 0440 09/10/13 0525 09/11/13 0700  NA 141 136 137  K 3.6 4.6 4.7  CL 109 103 102  CO2 23 23 24   BUN 7 4* 3*  CREATININE 0.74 0.70 0.67  CALCIUM 7.3* 8.1* 8.5  GLUCOSE 131* 131* 142*     HgbA1c 7.2  ProBNP 1298.0 (prior baseline 200s)  PT 13.5 / INR 1.05, APTT 28  12/7 UA Large leuks, neg nitrite, mod hgb, WBC TNTC, rare squam, many bacteria - suggestive of UTI  12/7 Urine Culture (E. Coli >100,000 CFU, sensitive to CTX)  Imaging/Diagnostic Tests:  12/7 Acute Abd / Chest Xray IMPRESSION:  No acute cardiopulmonary findings. Air-filled small bowel loops and colon with mild distention possibly due to gastroenteritis or ileus. No free air  12/7 CT Abd / Pelvis IMPRESSION:  1. There is distension of the right colon and transverse colon with air-fluid level. Distended distal small bowel loops with air-fluid level suspicious for ileus or partial small bowel obstruction. There is a apple core thickening of the colonic wall in the splenic flexure of the colon axial image 31 and 32 highly suspicious for colonic malignancy. Further correlation with colonoscopy is recommended. 2. No hydronephrosis or hydroureter.  12/9 Colonoscopy Obstructing descending colon mass. Biopsy positive for Adenocarcinoma.  12/11 2D ECHO - Left ventricle: Inferobasal hypokinesis. The cavity size was normal. Wall thickness was increased in a pattern of mild LVH. Systolic function was normal. The estimated ejection fraction was in the range of 55% to 60%. Wall motion was normal; there were no regional wall motion abnormalities. - Aortic valve: Trivial regurgitation. - Mitral valve: Calcified annulus. - Atrial septum: There was increased thickness of the septum, consistent with lipomatous hypertrophy. No defect or patent foramen ovale was identified.  12/11 Surgical Pathology (Omental Nodule, Colon) 1. Peritoneum, biopsy, Nodule - FAT NECROSIS WITH FIBROSIS. - NO EVIDENCE OF MALIGNANCY.  2. Colon, segmental resection for tumor, Splenic flexure - ADENOCARCINOMA EXTENDING INTO PERICOLONIC CONNECTIVE TISSUE. - MARGINS NOT INVOLVED. - FIVE BENIGN LYMPH NODES (0/5).  Beverely Low, MD 09/13/2013,  7:31 AM PGY-1 [facility] Family Medicine FPTS Intern pager: [phone], text pages welcome

## 2013-09-13 NOTE — Telephone Encounter (Signed)
Spoke with patient's daughter, Alita, by phone and conformed appointment with Dr. Truett Perna for 10/09/13.  Contact names and numbers were provided.

## 2013-09-13 NOTE — Progress Notes (Signed)
Physical Therapy Treatment Patient Details Name: [name] MRN: [id number] DOB: 1930/07/01 Today's Date: 09/13/2013 Time: 0930-1000 PT Time Calculation (min): 30 min  PT Assessment / Plan / Recommendation  History of Present Illness Pt is an 79 y/o female admitted with a SBO plus ileus, and was found to have an "apple-core lesion" on a CT scan. 12/11 underwent partial colectomy with colostomy for colon cancer. Remained intubated post-op.   PT Comments   Session focused on transferring pt from bed to chair. Initially daughter was adamant on taking pt home upon acute D/C. Session focused on educating pt and daughter regarding amount of (A) pt needs at this time for transfer. Daughter attempted to transfer pt independently and it was not a safe attempt. Pt requires 2+ (A) for transfers and is very fearful of falling. After lengthy discussion regarding home management vs. SNF; pt and daughter were agreeable to SNF. CSW notified. Will cont to follow per POC.   Follow Up Recommendations  SNF     Does the patient have the potential to tolerate intense rehabilitation     Barriers to Discharge        Equipment Recommendations  None recommended by PT    Recommendations for Other Services OT consult  Frequency Min 2X/week   Progress towards PT Goals Progress towards PT goals: Progressing toward goals  Plan Current plan remains appropriate    Precautions / Restrictions Precautions Precautions: Fall Restrictions Weight Bearing Restrictions: No   Pertinent Vitals/Pain C/o pain "all over"; did not rate pain.     Mobility  Bed Mobility Bed Mobility: Supine to Sit;Sitting - Scoot to Edge of Bed Supine to Sit: 2: Max assist;HOB elevated;With rails Sitting - Scoot to Edge of Bed: 2: Max assist Details for Bed Mobility Assistance: use of draw pad to bring hips and shoulders up to sitting position; pt was able to initiate movement of LEs but had difficulty advancing LEs off EOB due to  decreased strenth; cues for hand placement and sequencing Transfers Transfers: Sit to Stand;Stand to Sit;Stand Pivot Transfers Sit to Stand: 1: +2 Total assist;From bed;With upper extremity assist Sit to Stand: Patient Percentage: 10% Stand to Sit: 1: +2 Total assist;To chair/3-in-1;With upper extremity assist;With armrests Stand to Sit: Patient Percentage: 10% Stand Pivot Transfers: 1: +2 Total assist;From elevated surface Stand Pivot Transfers: Patient Percentage: 10% Details for Transfer Assistance: planned to attempt sara plus; daughter was present and insisting that she help and attempt SPT with RW; educated daughter on technique to guard and (A) pt; daughter was unable to (A) pt to chair independently; requries 2+ (A) to maintain balance and facilitate advancement of Lt LE; pt with difficulty maintaining standing position due to balance deficits, decr strength and decr activity tolerance; throuhgout transfer pt is fearful of falling  Ambulation/Gait Ambulation/Gait Assistance: Not tested (comment)         PT Diagnosis:    PT Problem List:   PT Treatment Interventions:     PT Goals (current goals can now be found in the care plan section) Acute Rehab PT Goals Patient Stated Goal: to not fall and go home eventually PT Goal Formulation: With patient Time For Goal Achievement: 09/25/13 Potential to Achieve Goals: Good  Visit Information  Last PT Received On: 09/13/13 Assistance Needed: +2 History of Present Illness: Pt is an 79 y/o female admitted with a SBO plus ileus, and was found to have an "apple-core lesion" on a CT scan. 12/11 underwent partial colectomy with colostomy for  colon cancer. Remained intubated post-op.    Subjective Data  Subjective: pt lying supine with daughter present; pt and daughter discussing with therapist about taking pt home with daughter upon acute D/C; pt and daughter agreeable to attempt transfer/ambulation with daughter participating in session for  family education Patient Stated Goal: to not fall and go home eventually   Cognition  Cognition Arousal/Alertness: Awake/alert Behavior During Therapy: Anxious Overall Cognitive Status: Within Functional Limits for tasks assessed    Balance  Balance Balance Assessed: Yes Static Sitting Balance Static Sitting - Balance Support: No upper extremity supported;Feet unsupported (Rt LE supported ) Static Sitting - Level of Assistance: 5: Stand by assistance;Other (comment) (min guard) Static Sitting - Comment/# of Minutes: pt tolerated sitting EOB ~ 8 min; at times requires min guard to facilitate upright posture; pt c/o fatigue with sitting   End of Session PT - End of Session Equipment Utilized During Treatment: Gait belt;Oxygen Activity Tolerance: Patient limited by fatigue Patient left: in chair;with call bell/phone within reach;with family/visitor present Nurse Communication: Mobility status;Need for lift equipment;Precautions   GP     [name] , [place] [phone]  09/13/2013, 1:16 PM

## 2013-09-14 ENCOUNTER — Telehealth: Payer: Self-pay | Admitting: Oncology

## 2013-09-14 LAB — CBC
Hemoglobin: 11.9 g/dL — ABNORMAL LOW (ref 12.0–15.0)
MCV: 87.6 fL (ref 78.0–100.0)
Platelets: 548 10*3/uL — ABNORMAL HIGH (ref 150–400)
RBC: 4.35 MIL/uL (ref 3.87–5.11)
WBC: 12.1 10*3/uL — ABNORMAL HIGH (ref 4.0–10.5)

## 2013-09-14 LAB — BASIC METABOLIC PANEL
BUN: 6 mg/dL (ref 6–23)
CO2: 25 mEq/L (ref 19–32)
Chloride: 103 mEq/L (ref 96–112)
Creatinine, Ser: 0.72 mg/dL (ref 0.50–1.10)
GFR calc non Af Amer: 78 mL/min — ABNORMAL LOW (ref >90)
Glucose, Bld: 144 mg/dL — ABNORMAL HIGH (ref 70–99)
Sodium: 138 mEq/L (ref 135–145)

## 2013-09-14 LAB — GLUCOSE, CAPILLARY
Glucose-Capillary: 150 mg/dL — ABNORMAL HIGH (ref 70–99)
Glucose-Capillary: 143 mg/dL — ABNORMAL HIGH (ref 70–99)

## 2013-09-14 MED ORDER — ATORVASTATIN CALCIUM 40 MG PO TABS: 40.0000 mg | ORAL_TABLET | Freq: Every day | ORAL | Status: DC

## 2013-09-14 MED ORDER — LISINOPRIL 10 MG PO TABS: 10.0000 mg | ORAL_TABLET | Freq: Every day | ORAL | Status: DC

## 2013-09-14 MED ORDER — SENNOSIDES-DOCUSATE SODIUM 8.6-50 MG PO TABS: 1.0000 | ORAL_TABLET | Freq: Two times a day (BID) | ORAL | Status: DC

## 2013-09-14 MED ORDER — METOPROLOL TARTRATE 25 MG PO TABS: 25.0000 mg | ORAL_TABLET | Freq: Two times a day (BID) | ORAL | Status: DC

## 2013-09-14 MED ORDER — TIOTROPIUM BROMIDE MONOHYDRATE 18 MCG IN CAPS: 18.0000 ug | ORAL_CAPSULE | Freq: Every day | RESPIRATORY_TRACT | Status: DC

## 2013-09-14 MED ORDER — ENSURE COMPLETE PO LIQD: 237.0000 mL | Freq: Three times a day (TID) | ORAL | Status: DC

## 2013-09-14 MED ORDER — CLOPIDOGREL BISULFATE 75 MG PO TABS: 75.0000 mg | ORAL_TABLET | Freq: Every day | ORAL | Status: DC

## 2013-09-14 NOTE — Progress Notes (Signed)
Patient ID: [name], female   DOB: 28-May-1930, 79 y.o.   MRN: [id number]   LOS: 12 days   Subjective: No new c/o. Denies N/V/excessive pain.   Objective: Vital signs in last 24 hours: Temp:  [98.6 F (37 C)-99.4 F (37.4 C)] 98.6 F (37 C) (12/19 0941) Pulse Rate:  [60-105] 83 (12/19 0941) Resp:  [17-21] 17 (12/19 0941) BP: (129-187)/(41-98) 145/71 mmHg (12/19 0941) SpO2:  [91 %-100 %] 94 % (12/19 0941) Weight:  [161 lb 4.8 oz (73.165 kg)] 161 lb 4.8 oz (73.165 kg) (12/18 2219) Last BM Date: 09/13/13   Laboratory  CBC  Recent Labs  09/14/13 0600  WBC 12.1*  HGB 11.9*  HCT 38.1  PLT 548*   BMET  Recent Labs  09/14/13 0600  NA 138  K 4.6  CL 103  CO2 25  GLUCOSE 144*  BUN 6  CREATININE 0.72  CALCIUM 8.6     Physical Exam General appearance: alert and no distress Resp: clear to auscultation bilaterally Cardio: regular rate and rhythm GI: normal findings: Soft, +BS, ostomy functional   Assessment/Plan: S/P colectomy/colostomy then colostomy revision - ostomy looks good, tolerating diet    Freeman Caldron, PA-C Pager: [phone] 09/14/2013

## 2013-09-14 NOTE — Consult Note (Signed)
WOC reviewed record, noted that patient was planning dc to SNF for rehab. Per bedside nursing this plan has changed and to be discharged possibly today home with daughter.  I have yet to meet with daughter for her to complete pouch change independently.  Pt's daughter is primary CG in home and she reports she had an ostomy before, however I would still like to see her perform pouch change if possible. Will contact her today.  If not the patient and CG would benefit from [facility] for assistance and continued teaching at home.    Noted at the time of my visit today, bedside nurses are concerned about her buttocks, she has blanchable erythema over the buttocks related to MASD (Moisture associated skin damage) from frequent episodes of urinary incontinence.  I have recommended dc use of foam dressings at this time for that reason and change to zinc based barrier cream only.  Would recommend LALM at home to assist with moisture management and pressure redistribution since this patient will be at high risk for skin breakdown.  Will attempt to reach daughter Seward Carol today to meet with her for ostomy teaching if possible.   Zaryia Markel [place] RN,CWOCN [phone]

## 2013-09-14 NOTE — Progress Notes (Signed)
Plan to d/c pt home today via [facility]AR. Spoke with daughter [name], who states she is planning to stay at home and meet the patient there. Informed family that we would be bagging up belongings and sending them with the patient. Will continue to monitor.

## 2013-09-14 NOTE — Progress Notes (Signed)
Family Medicine Teaching Service Daily Progress Note Intern Pager: [phone]  Patient name: [name] Medical record number: [id number] Date of birth: 29-May-1930 Age: 79 y.o. Gender: female  Primary Care Provider: Kaleen Mask, MD Consultants: General Surgery, GI Code Status: Full  Pt Overview and Major Events to Date:  12/7 - CT Abd showing distended R-colon, ileus / partial SBO, "apple-core lesion" suspicious for malignancy 12/10 - Colonoscopy (obstructing descending colon mass) 12/11 - ECHO normal EF, partial colon resection w/ colostomy, ICU o/n while on extubation 12/12 - POD #1. Extubated in ICU, plan to transfer to floor 12/13 - OR for ostomy revision (ischemia) 12/15 - NGT removed, advanced to clear liquid diet, PT-->SNF, CSW seeking placement offers 12/16 - Resume Plavix, remain clear liquid diet (no ostomy output), resume Lovenox DVT prophylaxis 12/17 - Full liquid diet, AFib today, inc Metoprolol 25 BID, consider Apixaban (if cleared per surgery?) 12/19 - Low fiber diet. Good ostomy output. Cleared for discharge by surgery. HH arrangements made.  Assessment and Plan: [name] is a 79 y.o. 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 PMH is significant for DM type 2, left sided CVA, paroxysmal atrial fibrillation, Chronic HF w/ preserved EF, and HTN.  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 Noted chart review h/o GI consult note in 2011 states that pt has a history of SBO. GI performed colonoscopy to identify L-colon  mass, bx with adenocarcinoma prior to surgery. - Pain Management:    - Morphine IV 2mg  q 3 hr PRN moderate pain (received x 3 doses 24 hrs)    - Tramadol 50mg  q 6 PRN (received x 1 dose in 24 hrs) - c/s General Surgery - greatly appreciate all recs     - POD# 8          - s/p partial colectomy with colostomy (adenocarcinoma removed, see path report for details).          - Required revision colostomy (OR 12/13) due to ischemia. Ostomy stump remains viable.          - continue low fiber diet, noted good ostomy output          - Cleared for discharge by General Surgery     - WOC consult for ostomy care     - contacted [facility] to arrange outpt Oncology follow-up, note sent Welcome Packet and will contact with apponitment     - CEA 1.4 (normal range) - PT (post-op) recommended SNF, patient / family refused, currently planning arrangements for [facility]  # Chronic Constipation, in setting of recent colectomy with ileus - bowel regimen with Docusate-Senna, now that pt is taking PO  # Paroxysmal Atrial Fibrillation Patient denies known history and not on anticoagulation (CHADSVASC score 9); EKG showed A-fib on admission and present on multiple EKG's in 2013. History of GI bleeding in 2011, so no anticoagulation at this time (HAS Bled Score 5)  - Continue to monitor on telemetry, reviewed rhythm per telemetry (NSR, with regular p waves, several PACs, PVCs with some irregularity). Noted AFib per telemetry 12/17, resumed NSR on 12/18. - switch to  Metoprolol PO 12.5mg  BID today - c/s Cardiology, greatly appreciate all recs   - continue Metoprolol 25mg  PO BID   - f/u outpatient 2-4 weeks after DC, if stable from GI concerns   - consider starting Apixaban (d/t AFib, if cleared per surgery)  # Chronic HF, with preserved EF - Grade 1 diastolic dysfunction per Echo 1610 - ECHO normal EF (55-60%), mild LVH - Lisinopril 10mg  daily  # COPD Hx of taking Spiriva and Albuterol, but currently taking no  meds - Use albuterol inhaler PRN - Spiriva daily - on 1L O2 via [place], monitor and titrate down as tolerated  # Inadequate oral intake 2/2 altered GI function Currently on clear liquid diet, considered advance to full liquid. Will remain on clear liquid for now. - Nutrition recommended changing resource breeze to Ensure Complete (maximize calories), continue to follow - Pre-albumin low 5.6  # Tobacco Abuse current daily smoke, pack years > 50  - Nicotine patch PRN  # Diabetes Mellitus - Hgb A1c 7.2 (09/03/13) - CBG's stable (100-120s). Now PO intake, change to CBGs q 4 hr AC, bedtime  - SSI Moderate - now resuming some PO intake  # Cerebrovascular Disease s/p left sided CVA 2008; History of taking Aggrenox but stopped in 2011 after upper GI bleed from multiple bleeding ulcers and started Plavix, but not currently taking any meds for this problem  - Given history of stroke, hold anti-coagulation until cleared per surgery - Plavix 75mg  daily, d/t stroke risk outweigh concerns of fall risk vs GI bleed  # HLD Previously taking simvastatin 40 mg, but no longer on statin at admission - Lipitor 40mg  daily  # Memory Deficits / Functional Decline - Pt admitted memory loss, not oriented to time; likely component of vascular dementia  - Continue to monitor  # Urinary Tract Infection, uncomplicated - Resolved U/A showing + leukocytes and bacteria; previous UTI in July 214 was E. Coli sensitive to CTX. - completed CTX 3 Day course, Urine cx (E.Coli sensitive to CTX) - Patient to follow up with Urology on discharge  # Urinary Incontinence - s/p Foley removal - good UOP - minimal UOP charted, however significant UOP with incontinence currently using pads  FEN/GI: Low fiber diet with supplement per nutrition, D5-1/2 NS 50cc/hr  Prophylaxis: SCDs, Lovenox 40mg  daily  Disposition: home when O2 weaned, PT (post-op) recommended SNF, patient / family refusing SNF, HHPT being arranged with plans  for likely discharge today - CM arrangements for [facility] RN, PT/OT, CSW - concern patient may require future placement if unsuccessful HH course.  Subjective: No acute overnight events. Tolerating low fiber diet well with good appetite, good ostomy output. Improved abdominal pain, controlled on Morphine. No other complaints. Wishes to go home and declines rehab facility. Daughter not in patient's room. Agreeable to discharge to today, pending arrangements.  Objective: Temp:  [98.1 F (36.7 C)-99.4 F (37.4 C)] 99.4 F (37.4 C) (12/18 2216) Pulse Rate:  [57-85] 85 (12/18 2216) Resp:  [17-18] 17 (12/18 2216) BP: (108-158)/(41-98) 158/98 mmHg (12/18 2216) SpO2:  [93 %-100 %] 93 % (12/18 2216) Weight:  [161 lb 4.8 oz (73.165 kg)] 161 lb 4.8 oz (73.165 kg) (12/18 2219) Physical Exam: General - laying up in elevated bed, pleasant, in good spirits, interactive, NAD.  Heart - RRR, S1/S2, no murmurs heard Lungs - CTAB, good air movement b/l, no significant wheezing, crackles, or rhonchi heard. Abd - soft, non-distended, improved mild central tenderness, noted midline surgical wound appears dry and  intact. Ostomy stump with pink viable tissue, no sign of infection or ischemia, wounds dressed appropriately, large amount of regular stool in ostomy bag Neuro - No focal deficits. Awake, alert.  Laboratory:  Recent Labs Lab 09/08/13 0440 09/10/13 0740 09/11/13 0837  WBC 14.0* 10.1 9.7  HGB 9.7* 10.8* 11.4*  HCT 30.2* 34.1* 35.5*  PLT 314 410* 530*    Recent Labs Lab 09/08/13 0440 09/10/13 0525 09/11/13 0700  NA 141 136 137  K 3.6 4.6 4.7  CL 109 103 102  CO2 23 23 24   BUN 7 4* 3*  CREATININE 0.74 0.70 0.67  CALCIUM 7.3* 8.1* 8.5  GLUCOSE 131* 131* 142*    HgbA1c 7.2  ProBNP 1298.0 (prior baseline 200s)  PT 13.5 / INR 1.05, APTT 28  12/7 UA Large leuks, neg nitrite, mod hgb, WBC TNTC, rare squam, many bacteria - suggestive of UTI  12/7 Urine Culture (E. Coli >100,000 CFU,  sensitive to CTX)  Imaging/Diagnostic Tests:  12/7 Acute Abd / Chest Xray IMPRESSION:  No acute cardiopulmonary findings. Air-filled small bowel loops and colon with mild distention possibly due to gastroenteritis or ileus. No free air  12/7 CT Abd / Pelvis IMPRESSION:  1. There is distension of the right colon and transverse colon with air-fluid level. Distended distal small bowel loops with air-fluid level suspicious for ileus or partial small bowel obstruction. There is a apple core thickening of the colonic wall in the splenic flexure of the colon axial image 31 and 32 highly suspicious for colonic malignancy. Further correlation with colonoscopy is recommended. 2. No hydronephrosis or hydroureter.  12/9 Colonoscopy Obstructing descending colon mass. Biopsy positive for Adenocarcinoma.  12/11 2D ECHO - Left ventricle: Inferobasal hypokinesis. The cavity size was normal. Wall thickness was increased in a pattern of mild LVH. Systolic function was normal. The estimated ejection fraction was in the range of 55% to 60%. Wall motion was normal; there were no regional wall motion abnormalities. - Aortic valve: Trivial regurgitation. - Mitral valve: Calcified annulus. - Atrial septum: There was increased thickness of the septum, consistent with lipomatous hypertrophy. No defect or patent foramen ovale was identified.  12/11 Surgical Pathology (Omental Nodule, Colon) 1. Peritoneum, biopsy, Nodule - FAT NECROSIS WITH FIBROSIS. - NO EVIDENCE OF MALIGNANCY.  2. Colon, segmental resection for tumor, Splenic flexure - ADENOCARCINOMA EXTENDING INTO PERICOLONIC CONNECTIVE TISSUE. - MARGINS NOT INVOLVED. - FIVE BENIGN LYMPH NODES (0/5).  Saralyn Pilar, DO 09/14/2013, 12:47 AM PGY-1 [facility] Family Medicine FPTS Intern pager: [phone], text pages welcome

## 2013-09-14 NOTE — Progress Notes (Signed)
Patient examined and I agree with the assessment and plan  Violeta Gelinas, MD, MPH, FACS Pager: [phone]  09/14/2013 2:51 PM

## 2013-09-14 NOTE — Progress Notes (Signed)
FMTS Attending Note Patient seen and examined by me, discussed with resident team and I agree with Dr Althea Charon' note and plan for discharge as documented. Paula Compton, MD

## 2013-09-14 NOTE — Telephone Encounter (Signed)
WELCOME PACKET MAILED.

## 2013-09-14 NOTE — Care Management Note (Signed)
   CARE MANAGEMENT NOTE 09/14/2013  Patient:  [name], [name]   Account Number:  [id number]  Date Initiated:  09/07/2013  Documentation initiated by:  [facility]  Subjective/Objective Assessment:   found rectal CA - surgery on 12-11 - post op on vent admitted to ICU.     Action/Plan:   09/13/13 Multiple changes by pt daughter, while PT recommending SNF, pt and daughter have decided on SNF then back to home with [facility] x3. Both selected [facility] for HH.   Anticipated DC Date:  09/14/2013   Anticipated DC Plan:  HOME W HOME HEALTH SERVICES      DC Planning Services  CM consult      Choice offered to / List presented to:     DME arranged  WALKER - Lavone Nian      DME agency  [facility]     [facility] arranged  HH-1 RN  HH-2 PT  HH-6 SOCIAL WORKER      HH agency  [facility]   Status of service:  Completed, signed off Medicare Important Message given?   (If response is "NO", the following Medicare IM given date fields will be blank) Date Medicare IM given:   Date Additional Medicare IM given:    Discharge Disposition:  HOME W HOME HEALTH SERVICES  Per UR Regulation:  Reviewed for med. necessity/level of care/duration of stay  If discussed at Long Length of Stay Meetings, dates discussed:    Comments:  ContactWilliam Hamburger Daughter [phone] 09/14/2013 Plan for d/c today, hospital bed and airmattress ordered, spoke with pt daughter re d/c. HH arranged. Pt will need ambulance as she is unable to get into family private vehicle for transport home.  CRoyal RN MPH   09/13/2013  Ongoing efforts by CSW to arrange short term SNF , which is recommended by PT, daughter was unable to demonstrate that she can assist pt OOB. However both state that they plan to go home. CM met with pt and daughter and they have used [facility] services previously and wish to use that agency again. Pt has walker at home and 3:1 per daughter. Son-in-law of daughter will provide  transportation. Pt will need to wean from oxygen, currently at 1l/min, and currently awaiting leg doppler due to leg pain. [facility] notifed of d/c needs. Johny Shock RN MPH, case manager, [phone]  09/12/2013  [address] RN, [place]  [phone] advancing diet to low fiber plan discharge to SNF  09-07-13 12:15pm Avie Arenas, RNBSN - [phone] New colostomy - will need support for f/u.  Caregiver is daughter.

## 2013-09-14 NOTE — Progress Notes (Signed)
RNCM called pt requesting PTAR for pt. CSW called RN and asked when she wants pt to be discharged; RN says asap so CSW called [facility]AR and arranged for transport. CSW placed PTAR form in pt's chart. CSW signing off.   Maryclare Labrador, MSW, [facility] Clinical Social Worker [phone]

## 2013-09-15 NOTE — Discharge Summary (Signed)
Patient seen and examined by me on the date of discharge, I agree with plan as per Dr Althea Charon' documentation.  Paula Compton, MD

## 2013-09-25 ENCOUNTER — Encounter (INDEPENDENT_AMBULATORY_CARE_PROVIDER_SITE_OTHER): Payer: Self-pay | Admitting: Surgery

## 2013-09-25 ENCOUNTER — Ambulatory Visit (INDEPENDENT_AMBULATORY_CARE_PROVIDER_SITE_OTHER): Payer: Medicare Other | Admitting: Surgery

## 2013-09-25 VITALS — BP 122/60 | HR 74 | Temp 97.4°F

## 2013-09-25 DIAGNOSIS — C189 Malignant neoplasm of colon, unspecified: Secondary | ICD-10-CM

## 2013-09-25 NOTE — Progress Notes (Signed)
General Surgery [facility]  Chief Complaint  Patient presents with  . Routine Post Op    partial colectomy 09/06/2013    HISTORY: Patient is an 79 year old female who underwent partial colectomy for obstructing adenocarcinoma of the colon on 09/06/2013. Postoperative course was complicated by necrosis of the mucosa after colostomy and required revision in the operating room by Dr. Lindie Spruce. Since discharge she has done well at home. She is accompanied today by her daughter.  EXAM: Surgical wound is healed nicely. Staples were removed from the wound today completely. Stoma is pink and viable in the left mid abdomen.  IMPRESSION: Adenocarcinoma of the colon, splenic flexure, T3 tumor, 5 lymph nodes negative for metastasis  PLAN: Patient is scheduled for consultation with medical oncology in January 2015.  I will see the patient back in 3 months. At that time we will discuss the possibility of colostomy reversal.  Velora Heckler, MD, FACS General & Endocrine Surgery [facility]   Visit Diagnoses: 1. Adenocarcinoma, colon

## 2013-09-25 NOTE — Patient Instructions (Signed)
  COCOA BUTTER & VITAMIN E CREAM  ([employer] or other brand)  Apply cocoa butter/vitamin E cream to your incision 2 - 3 times daily.  Massage cream into incision for one minute with each application.  Use sunscreen (50 SPF or higher) for first 6 months after surgery if area is exposed to sun.  You may substitute Mederma or other scar reducing creams as desired.   

## 2013-10-09 ENCOUNTER — Encounter: Payer: Self-pay | Admitting: Oncology

## 2013-10-09 ENCOUNTER — Ambulatory Visit (HOSPITAL_BASED_OUTPATIENT_CLINIC_OR_DEPARTMENT_OTHER): Payer: Medicare Other | Admitting: Oncology

## 2013-10-09 ENCOUNTER — Ambulatory Visit: Payer: Medicare Other

## 2013-10-09 ENCOUNTER — Telehealth (INDEPENDENT_AMBULATORY_CARE_PROVIDER_SITE_OTHER): Payer: Self-pay

## 2013-10-09 VITALS — BP 131/61 | HR 95 | Temp 98.9°F | Resp 18 | Ht 63.0 in | Wt 143.7 lb

## 2013-10-09 DIAGNOSIS — C184 Malignant neoplasm of transverse colon: Secondary | ICD-10-CM

## 2013-10-09 DIAGNOSIS — C189 Malignant neoplasm of colon, unspecified: Secondary | ICD-10-CM

## 2013-10-09 DIAGNOSIS — F17200 Nicotine dependence, unspecified, uncomplicated: Secondary | ICD-10-CM

## 2013-10-09 DIAGNOSIS — C50919 Malignant neoplasm of unspecified site of unspecified female breast: Secondary | ICD-10-CM

## 2013-10-09 DIAGNOSIS — J449 Chronic obstructive pulmonary disease, unspecified: Secondary | ICD-10-CM

## 2013-10-09 DIAGNOSIS — C786 Secondary malignant neoplasm of retroperitoneum and peritoneum: Secondary | ICD-10-CM

## 2013-10-09 DIAGNOSIS — N390 Urinary tract infection, site not specified: Secondary | ICD-10-CM

## 2013-10-09 DIAGNOSIS — C779 Secondary and unspecified malignant neoplasm of lymph node, unspecified: Secondary | ICD-10-CM

## 2013-10-09 NOTE — Telephone Encounter (Signed)
Dr Benay Spice wants Dr Harlow Asa to see this pt this week for a necrotic toe that he is worried about after seeing the pt today. The pt had a colon resection with colostomy in Dec. 2014 by Dr Harlow Asa. I advised Barnett Applebaum that I would have to send a message to [facility] and Dr Harlow Asa for them to call the pt tomorrow to make the decision on her being seen this week. They request for the pt's daughter to be called who is Saintclair Halsted on pt's emergency contacts.

## 2013-10-09 NOTE — Progress Notes (Signed)
Spoke with Almyra Free, RN with [facility]: Nursing orders for patient through surgeon have expired this week. Asking if Dr. Benay Spice needs her to continue to be followed from his standpoint? She is independent in her colostomy care per her daughter providing the care. She can be reached at [phone] if MD wants to continue nursing visits.

## 2013-10-09 NOTE — Progress Notes (Signed)
Met with Sherrie George and family. Explained role of nurse navigator. Educational information provided on colon cancer  [place] resources provided to patient, including SW  And dietician service information.  Smoking cessation information was provided to patient.  Patient's daughter stated the [facility] nurse was still coming to the house for care of ostomy.  Patient's daughter denied need for help with ostomy or supplies.  She appeared to be very knowledgeable and was taking good care of ostomy.  Patient declined referrals for above resources at this time. Contact names and phone numbers were provided for entire [facility] team.  Teach back method was used.   Spoke with Jenny Reichmann at [facility] to request appointment with Dr. Harlow Asa this week per request of Dr.Sherrill.  Jenny Reichmann stated the patient would receive a phone call tomorrow.  The patient and family were given Dr. Gala Lewandowsky number and advised to call if they have not heard anything by lunch time on 10/10/13.  Patient and family verbalized comprehension of above instructions.  No barriers to care identified.   Request to pathology was made to [facility] for IHC/MSI testing on Accession: [id number].

## 2013-10-09 NOTE — Progress Notes (Signed)
Checked in new pt with no financial concerns. °

## 2013-10-09 NOTE — Progress Notes (Addendum)
[name]   Referring MD: Avangelina Flight 80 y.o.  [birth year]    Reason for Referral: Colon cancer     HPI: She presented to the emergency room on 09/02/2013 with constipation, abdominal pain, and nausea. A plain x-ray on 09/02/2013 revealed no acute pulmonary findings. Dilated small bowel loops and colon with scattered air fluid levels were noted. A CT of the abdomen and pelvis on 09/02/2013. The liver and adrenal glands were unremarkable. The right colon was distended with fluid. Distention of the transverse colon and small bowel loops were noted. Splenic flexure thickening was suspicious for a malignancy.  Dr. Michail Sermon was [name] at and she was taken to a colonoscopy on 09/04/2013. A pediatric colonoscope was advanced the descending colon where an obstructing mass was noted. The colonoscope could not be advanced further. Biopsies were taken and the area was tattooed. A 1 cm sessile polyp was noted in the sigmoid colon.  Dr. Harlow Asa was consulted and she was taken to the operating room on 09/06/2013 for a partial colectomy with end colostomy and a long Hartmann's pouch. The liver appeared normal. A 1 cm implant was excised from the omentum. The tumor was located near the splenic flexure in the distal transverse colon. The mid transverse colon and mid descending colon were transected and the specimen was removed. A transverse colostomy was created.  The pathology ([id number]) revealed fat necrosis involving the peritoneum biopsy. No evidence of malignancy. The splenic flexure tumor returned as an adenocarcinoma extending into the pericolonic connective tissue. The margins were negative. 5 lymph nodes were negative for metastatic carcinoma. No macroscopic tumor perforation. The tumor was moderately differentiated. No lymphovascular or perineural invasion were seen. There were additional polyps-tubular adenomas. On 09/08/2013 the colostomy was  noted to be ischemic and she was taken to the operating room for revision of the colostomy.     Past Medical History  Diagnosis Date  . COPD (chronic obstructive pulmonary disease)   . CHF (congestive heart failure)     ECHO 2011  EF: 60% to 82%, grd 1 distolic dysfxn, 42-35% 3614 ECHO, tech limited by Afib  . Hypertension   . Stroke 2008    rt side deficit  . A-fib   . Diabetes mellitus   . Back pain   .  G4 P4    .  recurrent urinary tract infections        . Upper GI bleed 2011    required hospitalization  . Colon polyps   . Tobacco abuse    .    Adenocarcinoma, splenic flexure, stage II (T3 N0)                                         09/06/2013  Past Surgical History  Procedure Laterality Date  . Total abdominal hysterectomy  1980  . Colonoscopy N/A 09/04/2013    Procedure: COLONOSCOPY;  Surgeon: Lear Ng, MD;  Location: [facility] ENDOSCOPY;  Service: Endoscopy;  Laterality: N/A;  . Colon resection N/A 09/06/2013    Procedure: PARTIAL COLECTOMY WITH COLOSTOMY;  Surgeon: Earnstine Regal, MD;  Location: [place];  Service: General;  Laterality: N/A;  . Colostomy revision N/A 09/08/2013    Procedure: COLOSTOMY REVISION;  Surgeon: Gwenyth Ober, MD;  Location: [place];  Service: General;  Laterality: N/A;     family  history: [name] had lung cancer, [name] had cervical and uterine cancer, [name] had tonsil cancer .no other family history of cancer    Current outpatient prescriptions:atorvastatin (LIPITOR) 40 MG tablet, Take 1 tablet (40 mg total) by mouth daily at 6 PM., Disp: 30 tablet, Rfl: 3;  clopidogrel (PLAVIX) 75 MG tablet, Take 1 tablet (75 mg total) by mouth daily with breakfast., Disp: 30 tablet, Rfl: 3;  lisinopril (PRINIVIL,ZESTRIL) 10 MG tablet, Take 1 tablet (10 mg total) by mouth daily., Disp: 30 tablet, Rfl: 3 metoprolol tartrate (LOPRESSOR) 25 MG tablet, Take 1 tablet (25 mg total) by mouth 2 (two) times daily., Disp: 30 tablet, Rfl: 3;  morphine (MS  CONTIN) 30 MG 12 hr tablet, Take 30 mg by mouth 2 (two) times daily.  , Disp: , Rfl: ;  nortriptyline (PAMELOR) 25 MG capsule, Take 100 mg by mouth at bedtime., Disp: , Rfl: ;  senna-docusate (SENOKOT-S) 8.6-50 MG per tablet, Take 1 tablet by mouth 2 (two) times daily., Disp: 60 tablet, Rfl: 0 traMADol (ULTRAM) 50 MG tablet, Take 100 mg by mouth 2 (two) times daily., Disp: , Rfl: ;  traZODone (DESYREL) 150 MG tablet, Take 150 mg by mouth at bedtime.  , Disp: , Rfl: ;  nitroGLYCERIN (NITROSTAT) 0.4 MG SL tablet, Place 0.4 mg under the tongue every 5 (five) minutes as needed for chest pain., Disp: , Rfl:   Allergies:  Allergies  Allergen Reactions  . Aggrenox [Aspirin-Dipyridamole Er] Diarrhea and Nausea And Vomiting    Social History: She lives with [name] in [place]. She currently smokes 4 cigarettes per day. She does not use alcohol. No transfusion history. No risk factor for HIV or hepatitis.    ROS:   Positives include: 50 pound intentional weight loss over the past 1.5 years, intermittent cough, frequent urinary tract infections, urinary incontinence for the past 2 years, erythema and pain at the left first toe for the past week, right-sided weakness following a CVA in 2000 a   A complete ROS was otherwise negative.  Physical Exam:  Blood pressure 131/61, pulse 95, temperature 98.9 F (37.2 C), temperature source Oral, resp. rate 18, height $RemoveBe'5\' 3"'kelnlsptp$  (1.6 m), weight 143 lb 11.2 oz (65.182 kg), SpO2 100.00%.  HEENT:  edentulous, oropharynx without visible mass, neck without mass Lungs:  distant breath sounds, clear bilaterally  Cardiac:  regular rate and rhythm  Abdomen:  No hepatomegaly, healed midline incision, left lower quadrant colostomy, no mass, tender in the right abdomen  Vascular:  no leg edema  Lymph nodes:  no cervical, supraclavicular, axillary, or inguinal nodes  Neurologic:  alert and oriented, 4/5 strength at the right hand and leg Skin:  erythema at the tip of the  left great toe  Musculoskeletal:  no spine tenderness   LAB:  CBC  Lab Results  Component Value Date   WBC 12.1* 09/14/2013   HGB 11.9* 09/14/2013   HCT 38.1 09/14/2013   MCV 87.6 09/14/2013   PLT 548* 09/14/2013   NEUTROABS 11.7* 09/02/2013     CMP      Component Value Date/Time   NA 138 09/14/2013 0600   K 4.6 09/14/2013 0600   CL 103 09/14/2013 0600   CO2 25 09/14/2013 0600   GLUCOSE 144* 09/14/2013 0600   BUN 6 09/14/2013 0600   CREATININE 0.72 09/14/2013 0600   CALCIUM 8.6 09/14/2013 0600   PROT 6.7 09/03/2013 0638   ALBUMIN 2.9* 09/03/2013 0638   AST 8 09/03/2013 7673  ALT 5 09/03/2013 0638   ALKPHOS 58 09/03/2013 0638   BILITOT 0.3 09/03/2013 0638   GFRNONAA 78* 09/14/2013 0600   GFRAA >90 09/14/2013 0600    CEA on 09/08/2013-1.4  Radiology: As per history of present illness    Assessment/Plan:   1. Stage II (T3 N0) adenocarcinoma of the distal transverse colon/splenic flexure, status post a partial colectomy and end colostomy 09/06/2013, the tumor returned microsatellite stable with no loss of mismatch repair protein expression.  2. Multiple colonic polyps  3. Family history of uterine cancer ([name])  4. Partial right hemiplegia following a CVA in 2008  5. COPD and ongoing tobacco use-she was given smoking cessation materials today  6. Urinary tract infections  7. Urinary incontinence  8. Erythema and pain at the tip of the left great toe-she plans to see Dr. Harlow Asa later this week   Disposition:   [name] has been diagnosed with stage II colon cancer. I discussed the diagnosis, prognosis, and adjuvant treatment options with [name] and [name] family. We reviewed the details of the surgical pathology report. She has a good prognosis for a long-term disease-free survival. I explained the lack of data to confirm a benefit for adjuvant chemotherapy in the majority of patients with stage II colon cancer. [name] tumor does not have high-risk features  such as bowel perforation, lymphovascular invasion, or a markedly elevated CEA. There were a limited number of lymph nodes in the resection specimen. We had a discussion regarding the potential small benefit of adjuvant chemotherapy in [name] case. She does not wish to receive adjuvant chemotherapy  Multiple polyps were noted in the resection specimen. We will obtain microsatellite instability and mismatch repair protein testing on the resected tumor.  She will be referred to Dr. Michail Sermon for a colonoscopy prior to the colostomy takedown.  [name] will return for an office visit and CEA in 6 months.  [name], [name] 10/09/2013, 2:23 PM

## 2013-10-10 ENCOUNTER — Other Ambulatory Visit (HOSPITAL_COMMUNITY)
Admission: RE | Admit: 2013-10-10 | Discharge: 2013-10-10 | Disposition: A | Discharge status: Home / Self Care | Payer: Medicare Other | Source: Ambulatory Visit | Attending: Oncology | Admitting: Oncology

## 2013-10-10 ENCOUNTER — Telehealth (INDEPENDENT_AMBULATORY_CARE_PROVIDER_SITE_OTHER): Payer: Self-pay | Admitting: Surgery

## 2013-10-10 ENCOUNTER — Other Ambulatory Visit (INDEPENDENT_AMBULATORY_CARE_PROVIDER_SITE_OTHER): Payer: Self-pay

## 2013-10-10 ENCOUNTER — Telehealth: Payer: Self-pay | Admitting: *Deleted

## 2013-10-10 DIAGNOSIS — C189 Malignant neoplasm of colon, unspecified: Secondary | ICD-10-CM

## 2013-10-10 DIAGNOSIS — I96 Gangrene, not elsewhere classified: Secondary | ICD-10-CM

## 2013-10-10 NOTE — Telephone Encounter (Signed)
Discussed the patient by telephone today with Dr. Julieanne Manson. He had noted on physical exam a lesion on the great toe. He was worried about peripheral vascular disease and possible ischemia.   I recommend a consultation with vascular surgery. Patient may need noninvasive vascular studies. We will make arrangements for consultation with vascular surgery in the near future.  Earnstine Regal, MD, [facility] Office: [phone]

## 2013-10-10 NOTE — Telephone Encounter (Signed)
Dr Harlow Asa will call Dr Benay Spice to discuss this with him.

## 2013-10-10 NOTE — Telephone Encounter (Signed)
Per Dr. Benay Spice : No plans for treatment-her status shows excellent chance for good long term disease free survival without chemo. All home health orders/needs per Dr. Harlow Asa. None per medical oncology. Almyra Free, with [facility] notified.

## 2013-10-11 ENCOUNTER — Telehealth: Payer: Self-pay | Admitting: *Deleted

## 2013-10-11 ENCOUNTER — Other Ambulatory Visit: Payer: Self-pay | Admitting: *Deleted

## 2013-10-11 DIAGNOSIS — L97909 Non-pressure chronic ulcer of unspecified part of unspecified lower leg with unspecified severity: Secondary | ICD-10-CM

## 2013-10-11 NOTE — Telephone Encounter (Signed)
i will a letter/avs giving the pt an appt for 04/09/14 w/ labs@ 2:45pm and ov@ 3:15p. Per Dr. Michail Sermon office the pt was once seen by Dr. Oletta Lamas and has an outstanding bill. Pt need to speak with the billing office before she can get an appt. i will make the pt aware in the note.[name]

## 2013-10-15 ENCOUNTER — Encounter: Payer: Self-pay | Admitting: Vascular Surgery

## 2013-10-16 ENCOUNTER — Other Ambulatory Visit (HOSPITAL_COMMUNITY): Payer: Medicare Other

## 2013-10-16 ENCOUNTER — Encounter: Payer: Medicare Other | Admitting: Vascular Surgery

## 2013-10-16 ENCOUNTER — Telehealth: Payer: Self-pay | Admitting: *Deleted

## 2013-10-16 ENCOUNTER — Encounter (HOSPITAL_COMMUNITY): Payer: Self-pay

## 2013-10-16 ENCOUNTER — Telehealth (INDEPENDENT_AMBULATORY_CARE_PROVIDER_SITE_OTHER): Payer: Self-pay | Admitting: *Deleted

## 2013-10-16 ENCOUNTER — Encounter (HOSPITAL_COMMUNITY): Payer: Medicare Other

## 2013-10-16 NOTE — Telephone Encounter (Signed)
[facility] called to inform Dr. Harlow Asa that pt cancelled her appt for today and rescheduled for 11/15/13 @ 12:30pm.  Will make him aware.

## 2013-10-16 NOTE — Telephone Encounter (Signed)
Received MSI testic results on colon, splenic flexure tumor tissue: [id number].  Microsatellite Stable. Results to Dr. Sherrill's desk for review.  

## 2013-11-14 ENCOUNTER — Encounter: Payer: Self-pay | Admitting: Vascular Surgery

## 2013-11-15 ENCOUNTER — Other Ambulatory Visit (HOSPITAL_COMMUNITY): Payer: Medicare Other

## 2013-11-15 ENCOUNTER — Encounter (HOSPITAL_COMMUNITY): Payer: Medicare Other

## 2013-11-15 ENCOUNTER — Encounter: Payer: Medicare Other | Admitting: Vascular Surgery

## 2013-12-18 ENCOUNTER — Encounter: Payer: Self-pay | Admitting: Vascular Surgery

## 2013-12-19 ENCOUNTER — Encounter: Payer: Medicare Other | Admitting: Vascular Surgery

## 2013-12-19 ENCOUNTER — Inpatient Hospital Stay (HOSPITAL_COMMUNITY): Admission: RE | Admit: 2013-12-19 | Payer: Medicare Other | Source: Ambulatory Visit

## 2014-01-31 ENCOUNTER — Emergency Department (HOSPITAL_COMMUNITY): Payer: Medicare Other

## 2014-01-31 ENCOUNTER — Emergency Department (HOSPITAL_COMMUNITY)
Admission: EM | Admit: 2014-01-31 | Discharge: 2014-01-31 | Disposition: A | Discharge status: Home / Self Care | Payer: Medicare Other | Attending: Emergency Medicine | Admitting: Emergency Medicine

## 2014-01-31 ENCOUNTER — Encounter (HOSPITAL_COMMUNITY): Payer: Self-pay | Admitting: Emergency Medicine

## 2014-01-31 DIAGNOSIS — Z860100 Personal history of colon polyps, unspecified: Secondary | ICD-10-CM | POA: Insufficient documentation

## 2014-01-31 DIAGNOSIS — J449 Chronic obstructive pulmonary disease, unspecified: Secondary | ICD-10-CM | POA: Insufficient documentation

## 2014-01-31 DIAGNOSIS — Z85038 Personal history of other malignant neoplasm of large intestine: Secondary | ICD-10-CM | POA: Insufficient documentation

## 2014-01-31 DIAGNOSIS — N390 Urinary tract infection, site not specified: Secondary | ICD-10-CM

## 2014-01-31 DIAGNOSIS — F29 Unspecified psychosis not due to a substance or known physiological condition: Secondary | ICD-10-CM | POA: Insufficient documentation

## 2014-01-31 DIAGNOSIS — Z7902 Long term (current) use of antithrombotics/antiplatelets: Secondary | ICD-10-CM | POA: Insufficient documentation

## 2014-01-31 DIAGNOSIS — R109 Unspecified abdominal pain: Secondary | ICD-10-CM

## 2014-01-31 DIAGNOSIS — J4489 Other specified chronic obstructive pulmonary disease: Secondary | ICD-10-CM | POA: Insufficient documentation

## 2014-01-31 DIAGNOSIS — I10 Essential (primary) hypertension: Secondary | ICD-10-CM | POA: Insufficient documentation

## 2014-01-31 DIAGNOSIS — R110 Nausea: Secondary | ICD-10-CM | POA: Insufficient documentation

## 2014-01-31 DIAGNOSIS — I4891 Unspecified atrial fibrillation: Secondary | ICD-10-CM | POA: Insufficient documentation

## 2014-01-31 DIAGNOSIS — Z8673 Personal history of transient ischemic attack (TIA), and cerebral infarction without residual deficits: Secondary | ICD-10-CM | POA: Insufficient documentation

## 2014-01-31 DIAGNOSIS — Z933 Colostomy status: Secondary | ICD-10-CM | POA: Insufficient documentation

## 2014-01-31 DIAGNOSIS — Z79899 Other long term (current) drug therapy: Secondary | ICD-10-CM | POA: Insufficient documentation

## 2014-01-31 DIAGNOSIS — Z8719 Personal history of other diseases of the digestive system: Secondary | ICD-10-CM | POA: Insufficient documentation

## 2014-01-31 DIAGNOSIS — F17200 Nicotine dependence, unspecified, uncomplicated: Secondary | ICD-10-CM | POA: Insufficient documentation

## 2014-01-31 DIAGNOSIS — I509 Heart failure, unspecified: Secondary | ICD-10-CM | POA: Insufficient documentation

## 2014-01-31 DIAGNOSIS — E119 Type 2 diabetes mellitus without complications: Secondary | ICD-10-CM | POA: Insufficient documentation

## 2014-01-31 LAB — URINALYSIS, ROUTINE W REFLEX MICROSCOPIC
Bilirubin Urine: NEGATIVE
Glucose, UA: NEGATIVE mg/dL
KETONES UR: NEGATIVE mg/dL
NITRITE: POSITIVE — AB
Protein, ur: 30 mg/dL — AB
Specific Gravity, Urine: 1.006 (ref 1.005–1.030)
Urobilinogen, UA: 0.2 mg/dL (ref 0.0–1.0)
pH: 5.5 (ref 5.0–8.0)

## 2014-01-31 LAB — CBC WITH DIFFERENTIAL/PLATELET
BASOS PCT: 0 % (ref 0–1)
Basophils Absolute: 0 10*3/uL (ref 0.0–0.1)
EOS ABS: 0.4 10*3/uL (ref 0.0–0.7)
EOS PCT: 4 % (ref 0–5)
HEMATOCRIT: 33.2 % — AB (ref 36.0–46.0)
HEMOGLOBIN: 10.3 g/dL — AB (ref 12.0–15.0)
Lymphocytes Relative: 18 % (ref 12–46)
Lymphs Abs: 1.7 10*3/uL (ref 0.7–4.0)
MCH: 26.8 pg (ref 26.0–34.0)
MCHC: 31 g/dL (ref 30.0–36.0)
MCV: 86.2 fL (ref 78.0–100.0)
MONO ABS: 0.6 10*3/uL (ref 0.1–1.0)
MONOS PCT: 6 % (ref 3–12)
Neutro Abs: 6.7 10*3/uL (ref 1.7–7.7)
Neutrophils Relative %: 72 % (ref 43–77)
Platelets: 491 10*3/uL — ABNORMAL HIGH (ref 150–400)
RBC: 3.85 MIL/uL — ABNORMAL LOW (ref 3.87–5.11)
RDW: 15.7 % — ABNORMAL HIGH (ref 11.5–15.5)
WBC: 9.4 10*3/uL (ref 4.0–10.5)

## 2014-01-31 LAB — COMPREHENSIVE METABOLIC PANEL
ALBUMIN: 2.8 g/dL — AB (ref 3.5–5.2)
ALT: 5 U/L (ref 0–35)
AST: 17 U/L (ref 0–37)
Alkaline Phosphatase: 58 U/L (ref 39–117)
BUN: 15 mg/dL (ref 6–23)
CO2: 29 mEq/L (ref 19–32)
CREATININE: 1.1 mg/dL (ref 0.50–1.10)
Calcium: 9.4 mg/dL (ref 8.4–10.5)
Chloride: 96 mEq/L (ref 96–112)
GFR calc Af Amer: 53 mL/min — ABNORMAL LOW (ref >90)
GFR calc non Af Amer: 46 mL/min — ABNORMAL LOW (ref >90)
Glucose, Bld: 120 mg/dL — ABNORMAL HIGH (ref 70–99)
Potassium: 5 mEq/L (ref 3.7–5.3)
Sodium: 136 mEq/L — ABNORMAL LOW (ref 137–147)
TOTAL PROTEIN: 7.4 g/dL (ref 6.0–8.3)
Total Bilirubin: 0.2 mg/dL — ABNORMAL LOW (ref 0.3–1.2)

## 2014-01-31 LAB — LIPASE, BLOOD: LIPASE: 11 U/L (ref 11–59)

## 2014-01-31 LAB — URINE MICROSCOPIC-ADD ON

## 2014-01-31 MED ORDER — IOHEXOL 300 MG/ML  SOLN
50.0000 mL | Freq: Once | INTRAMUSCULAR | Status: AC | PRN
  Administered 2014-01-31: 50 mL via ORAL

## 2014-01-31 MED ORDER — IOHEXOL 300 MG/ML  SOLN
100.0000 mL | Freq: Once | INTRAMUSCULAR | Status: AC | PRN
  Administered 2014-01-31: 100 mL via INTRAVENOUS

## 2014-01-31 MED ORDER — CEPHALEXIN 500 MG PO CAPS: 500.0000 mg | ORAL_CAPSULE | Freq: Four times a day (QID) | ORAL | Status: DC

## 2014-01-31 MED ORDER — CEPHALEXIN 500 MG PO CAPS
500.0000 mg | ORAL_CAPSULE | Freq: Once | ORAL | Status: AC
  Administered 2014-01-31: 500 mg via ORAL
  Filled 2014-01-31: qty 1

## 2014-01-31 MED ORDER — FENTANYL CITRATE 0.05 MG/ML IJ SOLN
50.0000 ug | Freq: Once | INTRAMUSCULAR | Status: AC
  Administered 2014-01-31: 50 ug via INTRAVENOUS
  Filled 2014-01-31: qty 2

## 2014-01-31 MED ORDER — ONDANSETRON HCL 4 MG/2ML IJ SOLN
4.0000 mg | Freq: Once | INTRAMUSCULAR | Status: AC
  Administered 2014-01-31: 4 mg via INTRAVENOUS
  Filled 2014-01-31: qty 2

## 2014-01-31 NOTE — ED Notes (Signed)
Pt has chronic n/v.

## 2014-01-31 NOTE — ED Provider Notes (Signed)
CSN: [id number]     Arrival date & time 01/31/14  1314 History   First MD Initiated Contact with Patient 01/31/14 1326     Chief Complaint  Patient presents with  . Abdominal Pain    Caveat due to dementia (Consider location/radiation/quality/duration/timing/severity/associated sxs/prior Treatment) Patient is a 80 y.o. female presenting with abdominal pain. The history is provided by the patient.  Abdominal Pain Associated symptoms: nausea    patient presents with abdominal pain. Patient reportedly has had decreased output of her colostomy. She's had previous colon cancer post removal. She is not able to tell me much of a story.  Past Medical History  Diagnosis Date  . COPD (chronic obstructive pulmonary disease)   . CHF (congestive heart failure)     ECHO 2011  EF: 60% to 32%, grd 1 distolic dysfxn, 99-24% 2683 ECHO, tech limited by Afib  . Hypertension   . Stroke 2008    rt side deficit  . A-fib   . Diabetes mellitus   . Back pain   . Constipation   . SBO (small bowel obstruction)     per GI note in 2011, pt denies  . Upper GI bleed 2011    required hospitalization  . Colon polyps   . Tobacco abuse    Past Surgical History  Procedure Laterality Date  . Total abdominal hysterectomy  1980  . Colonoscopy N/A 09/04/2013    Procedure: COLONOSCOPY;  Surgeon: Lear Ng, MD;  Location[facility] ENDOSCOPY;  Service: Endoscopy;  Laterality: N/A;  . Colon resection N/A 09/06/2013    Procedure: PARTIAL COLECTOMY WITH COLOSTOMY;  Surgeon: Earnstine Regal, MD;  Location: [place];  Service: General;  Laterality: N/A;  . Colostomy revision N/A 09/08/2013    Procedure: COLOSTOMY REVISION;  Surgeon: Gwenyth Ober, MD;  Location: [place];  Service: General;  Laterality: N/A;   No family history on file. History  Substance Use Topics  . Smoking status: Current Every Day Smoker -- 0.50 packs/day for 15 years    Types: Cigarettes  . Smokeless tobacco: Not on file  . Alcohol Use: Yes   OB  History   Grav Para Term Preterm Abortions TAB SAB Ect Mult Living                 Review of Systems  Unable to perform ROS Gastrointestinal: Positive for nausea and abdominal pain.      Allergies  Aggrenox  Home Medications   Prior to Admission medications   Medication Sig Start Date End Date Taking? Authorizing Provider  bag balm OINT ointment Apply 1 application topically 2 (two) times daily as needed for dry skin or irritation.   Yes Historical Provider, MD  clopidogrel (PLAVIX) 75 MG tablet Take 1 tablet (75 mg total) by mouth daily with breakfast. 09/14/13  Yes Nobie Putnam, DO  lisinopril (PRINIVIL,ZESTRIL) 10 MG tablet Take 1 tablet (10 mg total) by mouth daily. 09/14/13  Yes Alexander Parks Ranger, DO  morphine (MS CONTIN) 30 MG 12 hr tablet Take 30 mg by mouth 2 (two) times daily.     Yes Historical Provider, MD  nitroGLYCERIN (NITROSTAT) 0.4 MG SL tablet Place 0.4 mg under the tongue every 5 (five) minutes as needed for chest pain.   Yes Historical Provider, MD  nortriptyline (PAMELOR) 75 MG capsule Take 150 mg by mouth at bedtime.   Yes Historical Provider, MD  senna-docusate (SENOKOT-S) 8.6-50 MG per tablet Take 1 tablet by mouth 2 (two) times daily. 09/14/13  Yes Nobie Putnam, DO  traMADol (ULTRAM) 50 MG tablet Take 100 mg by mouth 4 (four) times daily.    Yes Historical Provider, MD  traZODone (DESYREL) 150 MG tablet Take 150 mg by mouth at bedtime.     Yes Historical Provider, MD   BP 141/51  Pulse 87  Temp(Src) 98.6 F (37 C) (Oral)  Resp 20  SpO2 95% Physical Exam  Constitutional: She appears well-developed and well-nourished.  Eyes: Pupils are equal, round, and reactive to light.  Neck: Neck supple.  Cardiovascular: Normal rate and regular rhythm.   Pulmonary/Chest: Effort normal and breath sounds normal.  Abdominal:  Diffuse tenderness. No distention. Colostomy the left abdomen. Pink stoma.   Musculoskeletal: Normal range of motion. She  exhibits no edema.  Neurological: She is alert.  Mild cionfusion     ED Course  Procedures (including critical care time) Labs Review Labs Reviewed  CBC WITH DIFFERENTIAL - Abnormal; Notable for the following:    RBC 3.85 (*)    Hemoglobin 10.3 (*)    HCT 33.2 (*)    RDW 15.7 (*)    Platelets 491 (*)    All other components within normal limits  COMPREHENSIVE METABOLIC PANEL - Abnormal; Notable for the following:    Sodium 136 (*)    Glucose, Bld 120 (*)    Albumin 2.8 (*)    Total Bilirubin 0.2 (*)    GFR calc non Af Amer 46 (*)    GFR calc Af Amer 53 (*)    All other components within normal limits  LIPASE, BLOOD  URINALYSIS, ROUTINE W REFLEX MICROSCOPIC    Imaging Review No results found.   EKG Interpretation None      MDM   Final diagnoses:  None    Patient with abdominal pain. Lab work overall reassuring. Has colostomy. CT scan pending.     Jasper Riling. Alvino Chapel, MD 01/31/14 1520

## 2014-01-31 NOTE — ED Notes (Signed)
Per EMS: pt has right lower abd pain, hx of colon cancer w/ colostomy bag. No BM in 3 days.

## 2014-01-31 NOTE — ED Notes (Signed)
Bed: WA03 Expected date:  Expected time:  Means of arrival:  Comments: EMS- [place] Pt, abdominal pain

## 2014-01-31 NOTE — ED Notes (Signed)
Patient is aware we need a urine specimen. 

## 2014-01-31 NOTE — ED Provider Notes (Signed)
5:45 PM Reevaluation with update and discussion. After initial assessment and treatment, an updated evaluation reveals she is comfortable now, vital signs are reassuring, no further complaints. Findings discussed with patient and daughter, all questions answered . [name]    Assessment: Abdominal pain, with urinary tract infection. She's improved with treatment in the ED, in stable for discharge.  Nursing Notes Reviewed/ Care Coordinated Applicable Imaging Reviewed Interpretation of Laboratory Data incorporated into ED treatment  The patient appears reasonably screened and/or stabilized for discharge and I doubt any other medical condition or other [facility] requiring further screening, evaluation, or treatment in the ED at this time prior to discharge.  Plan: Home Medications- Keflex; Home Treatments- increase oral fluids; return here if the recommended treatment, does not improve the symptoms; Recommended follow up- PCP followup 1 week    Ct Abdomen Pelvis W Contrast  01/31/2014   CLINICAL DATA:  Abdominal pain. Status post colostomy. Colon cancer resection. Hysterectomy, colostomy bag. History of COPD, hypertension, SBO.  EXAM: CT ABDOMEN AND PELVIS WITH CONTRAST  TECHNIQUE: Multidetector CT imaging of the abdomen and pelvis was performed using the standard protocol following bolus administration of intravenous contrast.  CONTRAST:  66mL OMNIPAQUE IOHEXOL 300 MG/ML SOLN, 14mL OMNIPAQUE IOHEXOL 300 MG/ML SOLN  COMPARISON:  CT ABD/PELVIS W CM dated 09/02/2013; CT ABD/PELVIS W CM dated 10/02/2011; CT ABD/PELVIS W CM dated 04/04/2010  FINDINGS: There is bibasilar atelectasis. Trace bilateral pleural effusions are present. The heart is enlarged. There is atherosclerotic calcification of the coronary arteries and mitral annulus.  There is focal fatty infiltration adjacent to the falciform ligament of the liver. Small low-attenuation lesion is identified within the right hepatic lobe measuring 1 cm on image  18 of series 2. In the left hepatic lobe there is a small hypervascular focus seen best on image 13 of series 2. This may represent a small perfusion abnormality or less likely a discrete lesion. Splenic calcification is noted. No other splenic lesions are identified. No focal abnormality identified within the pancreas or adrenal glands. Gallbladder sludge or gallstones are present.  The stomach and small bowel loops are normal in appearance. Patient has a transverse colostomy. Colostomy site is unremarkable in appearance. Distal colon is collapsed and unremarkable in appearance. No evidence for bowel obstruction, abscess. No mesenteric nodules or ascites. There is significant atherosclerotic calcification of the abdominal aorta and its branches. Infrarenal abdominal aortic aneurysm is 3.1 cm.  There is irregular thickening of the urinary bladder wall.  There is significant degenerative change throughout the lumbar spine. Superior and inferior endplate fractures are identified at multiple levels. Which fractures are also noted. Findings are consistent osteoporotic type fractures. No suspicious lytic or blastic lesions are identified to suggest presence of metastatic disease.  IMPRESSION: 1. Postoperative changes. 2. Small hepatic lesions, favored to be benign. 3. Gallbladder sludge or gallstones. 4. No bowel obstruction.  Colostomy site is unremarkable. 5. Significant atherosclerotic calcification of the aorta and small infrarenal abdominal aortic aneurysm.   Electronically Signed   By: Shon Hale M.D.   On: 01/31/2014 16:56   Results for orders placed during the hospital encounter of 01/31/14  CBC WITH DIFFERENTIAL      Result Value Ref Range   WBC 9.4  4.0 - 10.5 K/uL   RBC 3.85 (*) 3.87 - 5.11 MIL/uL   Hemoglobin 10.3 (*) 12.0 - 15.0 g/dL   HCT 33.2 (*) 36.0 - 46.0 %   MCV 86.2  78.0 - 100.0 fL  MCH 26.8  26.0 - 34.0 pg   MCHC 31.0  30.0 - 36.0 g/dL   RDW 15.7 (*) 11.5 - 15.5 %   Platelets 491 (*)  150 - 400 K/uL   Neutrophils Relative % 72  43 - 77 %   Neutro Abs 6.7  1.7 - 7.7 K/uL   Lymphocytes Relative 18  12 - 46 %   Lymphs Abs 1.7  0.7 - 4.0 K/uL   Monocytes Relative 6  3 - 12 %   Monocytes Absolute 0.6  0.1 - 1.0 K/uL   Eosinophils Relative 4  0 - 5 %   Eosinophils Absolute 0.4  0.0 - 0.7 K/uL   Basophils Relative 0  0 - 1 %   Basophils Absolute 0.0  0.0 - 0.1 K/uL  COMPREHENSIVE METABOLIC PANEL      Result Value Ref Range   Sodium 136 (*) 137 - 147 mEq/L   Potassium 5.0  3.7 - 5.3 mEq/L   Chloride 96  96 - 112 mEq/L   CO2 29  19 - 32 mEq/L   Glucose, Bld 120 (*) 70 - 99 mg/dL   BUN 15  6 - 23 mg/dL   Creatinine, Ser 1.10  0.50 - 1.10 mg/dL   Calcium 9.4  8.4 - 10.5 mg/dL   Total Protein 7.4  6.0 - 8.3 g/dL   Albumin 2.8 (*) 3.5 - 5.2 g/dL   AST 17  0 - 37 U/L   ALT 5  0 - 35 U/L   Alkaline Phosphatase 58  39 - 117 U/L   Total Bilirubin 0.2 (*) 0.3 - 1.2 mg/dL   GFR calc non Af Amer 46 (*) >90 mL/min   GFR calc Af Amer 53 (*) >90 mL/min  LIPASE, BLOOD      Result Value Ref Range   Lipase 11  11 - 59 U/L  URINALYSIS, ROUTINE W REFLEX MICROSCOPIC      Result Value Ref Range   Color, Urine YELLOW  YELLOW   APPearance TURBID (*) CLEAR   Specific Gravity, Urine 1.006  1.005 - 1.030   pH 5.5  5.0 - 8.0   Glucose, UA NEGATIVE  NEGATIVE mg/dL   Hgb urine dipstick MODERATE (*) NEGATIVE   Bilirubin Urine NEGATIVE  NEGATIVE   Ketones, ur NEGATIVE  NEGATIVE mg/dL   Protein, ur 30 (*) NEGATIVE mg/dL   Urobilinogen, UA 0.2  0.0 - 1.0 mg/dL   Nitrite POSITIVE (*) NEGATIVE   Leukocytes, UA LARGE (*) NEGATIVE  URINE MICROSCOPIC-ADD ON      Result Value Ref Range   Urine-Other FIELD OBSCURED BY WBC'S        [name] Blade, MD 01/31/14 1819

## 2014-01-31 NOTE — Discharge Instructions (Signed)
Start the antibiotic prescription in the morning. Drink plenty of fluids. Use MiraLax, if needed, for constipation.   Abdominal Pain, Adult Many things can cause abdominal pain. Usually, abdominal pain is not caused by a disease and will improve without treatment. It can often be observed and treated at home. Your health care provider will do a physical exam and possibly order blood tests and X-rays to help determine the seriousness of your pain. However, in many cases, more time must pass before a clear cause of the pain can be found. Before that point, your health care provider may not know if you need more testing or further treatment. HOME CARE INSTRUCTIONS  Monitor your abdominal pain for any changes. The following actions may help to alleviate any discomfort you are experiencing:  Only take over-the-counter or prescription medicines as directed by your health care provider.  Do not take laxatives unless directed to do so by your health care provider.  Try a clear liquid diet (broth, tea, or water) as directed by your health care provider. Slowly move to a bland diet as tolerated. SEEK MEDICAL CARE IF:  You have unexplained abdominal pain.  You have abdominal pain associated with nausea or diarrhea.  You have pain when you urinate or have a bowel movement.  You experience abdominal pain that wakes you in the night.  You have abdominal pain that is worsened or improved by eating food.  You have abdominal pain that is worsened with eating fatty foods. SEEK IMMEDIATE MEDICAL CARE IF:   Your pain does not go away within 2 hours.  You have a fever.  You keep throwing up (vomiting).  Your pain is felt only in portions of the abdomen, such as the right side or the left lower portion of the abdomen.  You pass bloody or black tarry stools. MAKE SURE YOU:  Understand these instructions.   Will watch your condition.   Will get help right away if you are not doing well or get  worse.  Document Released: 06/23/2005 Document Revised: 07/04/2013 Document Reviewed: 05/23/2013 [facility] Patient Information 2014 [place].  Urinary Tract Infection A urinary tract infection (UTI) can occur any place along the urinary tract. The tract includes the kidneys, ureters, bladder, and urethra. A type of germ called bacteria often causes a UTI. UTIs are often helped with antibiotic medicine.  HOME CARE   If given, take antibiotics as told by your doctor. Finish them even if you start to feel better.  Drink enough fluids to keep your pee (urine) clear or pale yellow.  Avoid tea, drinks with caffeine, and bubbly (carbonated) drinks.  Pee often. Avoid holding your pee in for a long time.  Pee before and after having sex (intercourse).  Wipe from front to back after you poop (bowel movement) if you are a woman. Use each tissue only once. GET HELP RIGHT AWAY IF:   You have back pain.  You have lower belly (abdominal) pain.  You have chills.  You feel sick to your stomach (nauseous).  You throw up (vomit).  Your burning or discomfort with peeing does not go away.  You have a fever.  Your symptoms are not better in 3 days. MAKE SURE YOU:   Understand these instructions.  Will watch your condition.  Will get help right away if you are not doing well or get worse. Document Released: 03/01/2008 Document Revised: 06/07/2012 Document Reviewed: 04/13/2012 [facility] Patient Information 2014 [place], [place].

## 2014-01-31 NOTE — Progress Notes (Signed)
  CARE MANAGEMENT ED NOTE 01/31/2014  Patient:  [name], [name]   Account Number:  [id number]  Date Initiated:  01/31/2014  Documentation initiated by:  Livia Snellen  Subjective/Objective Assessment:   Patient presents to Ed with abdominal pain.     Subjective/Objective Assessment Detail:     Action/Plan:   Action/Plan Detail:   Anticipated DC Date:       Status Recommendation to Physician:   Result of Recommendation:    Other ED Services  Consult [facility] [place]  Other    Choice offered to / List presented to:            Status of service:  Completed, signed off  ED Comments:   ED Comments Detail:  EDCM spoke to patient and her daughter at bedside.  Patient lies with her daughter 79.  Patient does not have home health services at this time..  Patient has has [facility] in the past.  Patient has a walker, wheelchair and a hoveround scooter at home.  Patient's daughter assists patient with ADL's.  Patient confirms her pcp is Dr. Arelia Sneddon.  Patient reports her pcp makes house calls for her.  EDCM provided patient's daughter with list of home health agencies in [facility], list of private duty nursing services and printed information regarding the [employer] [facility] [place].  EDCM explained with  home health the patient may receive a visiting RN, PT, OT and social worker if needed. Also informed patient that private duty nursing services may be an out of pocket expense for patient.  EDCM wanted to refer patient to [facility], however patient's pcp is not a participant.  Patient and patient's daughter thankful for resources.  No further EDCM needs at this time.

## 2014-02-02 LAB — URINE CULTURE
Colony Count: >=100000
Special Requests: NORMAL

## 2014-02-11 ENCOUNTER — Encounter (INDEPENDENT_AMBULATORY_CARE_PROVIDER_SITE_OTHER): Payer: Self-pay | Admitting: Surgery

## 2014-02-13 ENCOUNTER — Other Ambulatory Visit (HOSPITAL_COMMUNITY): Payer: Medicare Other

## 2014-02-13 ENCOUNTER — Encounter (HOSPITAL_COMMUNITY): Payer: Medicare Other

## 2014-02-13 ENCOUNTER — Encounter: Payer: Medicare Other | Admitting: Vascular Surgery

## 2014-04-09 ENCOUNTER — Ambulatory Visit: Payer: Medicare Other | Admitting: Oncology

## 2014-04-09 ENCOUNTER — Other Ambulatory Visit: Payer: Medicare Other

## 2014-04-17 ENCOUNTER — Encounter (HOSPITAL_BASED_OUTPATIENT_CLINIC_OR_DEPARTMENT_OTHER): Payer: Medicare Other | Attending: General Surgery

## 2014-06-16 ENCOUNTER — Inpatient Hospital Stay (HOSPITAL_COMMUNITY)
Admission: EM | Admit: 2014-06-16 | Discharge: 2014-06-21 | DRG: 690 | Disposition: A | Discharge status: Home / Self Care | Payer: Medicare Other | Attending: Internal Medicine | Admitting: Internal Medicine

## 2014-06-16 ENCOUNTER — Encounter (HOSPITAL_COMMUNITY): Payer: Self-pay | Admitting: Emergency Medicine

## 2014-06-16 ENCOUNTER — Emergency Department (HOSPITAL_COMMUNITY): Payer: Medicare Other

## 2014-06-16 DIAGNOSIS — E1310 Other specified diabetes mellitus with ketoacidosis without coma: Secondary | ICD-10-CM

## 2014-06-16 DIAGNOSIS — Z85038 Personal history of other malignant neoplasm of large intestine: Secondary | ICD-10-CM

## 2014-06-16 DIAGNOSIS — R109 Unspecified abdominal pain: Secondary | ICD-10-CM | POA: Diagnosis present

## 2014-06-16 DIAGNOSIS — C189 Malignant neoplasm of colon, unspecified: Secondary | ICD-10-CM

## 2014-06-16 DIAGNOSIS — N390 Urinary tract infection, site not specified: Principal | ICD-10-CM | POA: Diagnosis present

## 2014-06-16 DIAGNOSIS — K1120 Sialoadenitis, unspecified: Secondary | ICD-10-CM

## 2014-06-16 DIAGNOSIS — I4891 Unspecified atrial fibrillation: Secondary | ICD-10-CM | POA: Diagnosis present

## 2014-06-16 DIAGNOSIS — Z23 Encounter for immunization: Secondary | ICD-10-CM

## 2014-06-16 DIAGNOSIS — E119 Type 2 diabetes mellitus without complications: Secondary | ICD-10-CM | POA: Diagnosis present

## 2014-06-16 DIAGNOSIS — F0390 Unspecified dementia without behavioral disturbance: Secondary | ICD-10-CM | POA: Diagnosis present

## 2014-06-16 DIAGNOSIS — J4489 Other specified chronic obstructive pulmonary disease: Secondary | ICD-10-CM | POA: Diagnosis present

## 2014-06-16 DIAGNOSIS — R1084 Generalized abdominal pain: Secondary | ICD-10-CM | POA: Diagnosis not present

## 2014-06-16 DIAGNOSIS — Z933 Colostomy status: Secondary | ICD-10-CM

## 2014-06-16 DIAGNOSIS — J449 Chronic obstructive pulmonary disease, unspecified: Secondary | ICD-10-CM | POA: Diagnosis present

## 2014-06-16 DIAGNOSIS — I69959 Hemiplegia and hemiparesis following unspecified cerebrovascular disease affecting unspecified side: Secondary | ICD-10-CM

## 2014-06-16 DIAGNOSIS — A498 Other bacterial infections of unspecified site: Secondary | ICD-10-CM | POA: Diagnosis present

## 2014-06-16 DIAGNOSIS — F17200 Nicotine dependence, unspecified, uncomplicated: Secondary | ICD-10-CM | POA: Diagnosis present

## 2014-06-16 DIAGNOSIS — I480 Paroxysmal atrial fibrillation: Secondary | ICD-10-CM | POA: Diagnosis present

## 2014-06-16 DIAGNOSIS — R221 Localized swelling, mass and lump, neck: Secondary | ICD-10-CM

## 2014-06-16 DIAGNOSIS — Z66 Do not resuscitate: Secondary | ICD-10-CM | POA: Diagnosis present

## 2014-06-16 DIAGNOSIS — I10 Essential (primary) hypertension: Secondary | ICD-10-CM | POA: Diagnosis present

## 2014-06-16 DIAGNOSIS — D649 Anemia, unspecified: Secondary | ICD-10-CM

## 2014-06-16 DIAGNOSIS — I509 Heart failure, unspecified: Secondary | ICD-10-CM | POA: Diagnosis present

## 2014-06-16 DIAGNOSIS — Z79899 Other long term (current) drug therapy: Secondary | ICD-10-CM

## 2014-06-16 LAB — COMPREHENSIVE METABOLIC PANEL
ALBUMIN: 2.9 g/dL — AB (ref 3.5–5.2)
ALT: 6 U/L (ref 0–35)
AST: 15 U/L (ref 0–37)
Alkaline Phosphatase: 76 U/L (ref 39–117)
Anion gap: 15 (ref 5–15)
BUN: 33 mg/dL — AB (ref 6–23)
CO2: 25 mEq/L (ref 19–32)
CREATININE: 0.86 mg/dL (ref 0.50–1.10)
Calcium: 9 mg/dL (ref 8.4–10.5)
Chloride: 102 mEq/L (ref 96–112)
GFR calc Af Amer: 71 mL/min — ABNORMAL LOW (ref >90)
GFR calc non Af Amer: 61 mL/min — ABNORMAL LOW (ref >90)
Glucose, Bld: 159 mg/dL — ABNORMAL HIGH (ref 70–99)
Potassium: 3.8 mEq/L (ref 3.7–5.3)
Sodium: 142 mEq/L (ref 137–147)
TOTAL PROTEIN: 7.7 g/dL (ref 6.0–8.3)
Total Bilirubin: <0.2 mg/dL — ABNORMAL LOW (ref 0.3–1.2)

## 2014-06-16 LAB — CBC WITH DIFFERENTIAL/PLATELET
BASOS ABS: 0.1 10*3/uL (ref 0.0–0.1)
BASOS PCT: 1 % (ref 0–1)
EOS ABS: 0.4 10*3/uL (ref 0.0–0.7)
EOS PCT: 4 % (ref 0–5)
HEMATOCRIT: 36.4 % (ref 36.0–46.0)
Hemoglobin: 11.3 g/dL — ABNORMAL LOW (ref 12.0–15.0)
Lymphocytes Relative: 32 % (ref 12–46)
Lymphs Abs: 3.2 10*3/uL (ref 0.7–4.0)
MCH: 26.2 pg (ref 26.0–34.0)
MCHC: 31 g/dL (ref 30.0–36.0)
MCV: 84.5 fL (ref 78.0–100.0)
MONO ABS: 0.8 10*3/uL (ref 0.1–1.0)
Monocytes Relative: 8 % (ref 3–12)
Neutro Abs: 5.8 10*3/uL (ref 1.7–7.7)
Neutrophils Relative %: 57 % (ref 43–77)
Platelets: 417 10*3/uL — ABNORMAL HIGH (ref 150–400)
RBC: 4.31 MIL/uL (ref 3.87–5.11)
RDW: 18.4 % — AB (ref 11.5–15.5)
WBC: 10.2 10*3/uL (ref 4.0–10.5)

## 2014-06-16 LAB — URINALYSIS, ROUTINE W REFLEX MICROSCOPIC
Bilirubin Urine: NEGATIVE
Glucose, UA: NEGATIVE mg/dL
Ketones, ur: NEGATIVE mg/dL
NITRITE: NEGATIVE
PH: 5.5 (ref 5.0–8.0)
Protein, ur: >300 mg/dL — AB
SPECIFIC GRAVITY, URINE: 1.019 (ref 1.005–1.030)
Urobilinogen, UA: 0.2 mg/dL (ref 0.0–1.0)

## 2014-06-16 LAB — LACTIC ACID, PLASMA: Lactic Acid, Venous: 2.3 mmol/L — ABNORMAL HIGH (ref 0.5–2.2)

## 2014-06-16 LAB — I-STAT CG4 LACTIC ACID, ED: LACTIC ACID, VENOUS: 2.13 mmol/L (ref 0.5–2.2)

## 2014-06-16 LAB — URINE MICROSCOPIC-ADD ON

## 2014-06-16 MED ORDER — IOHEXOL 300 MG/ML  SOLN
100.0000 mL | Freq: Once | INTRAMUSCULAR | Status: AC | PRN
  Administered 2014-06-16: 100 mL via INTRAVENOUS

## 2014-06-16 MED ORDER — MORPHINE SULFATE 4 MG/ML IJ SOLN
4.0000 mg | Freq: Once | INTRAMUSCULAR | Status: AC
  Administered 2014-06-16: 4 mg via INTRAVENOUS
  Filled 2014-06-16: qty 1

## 2014-06-16 MED ORDER — DEXTROSE 5 % IV SOLN
1.0000 g | Freq: Once | INTRAVENOUS | Status: AC
  Administered 2014-06-16: 1 g via INTRAVENOUS
  Filled 2014-06-16: qty 10

## 2014-06-16 MED ORDER — IOHEXOL 300 MG/ML  SOLN
50.0000 mL | Freq: Once | INTRAMUSCULAR | Status: AC | PRN
Start: 2014-06-16 — End: 2014-06-16
  Administered 2014-06-16: 50 mL via ORAL

## 2014-06-16 MED ORDER — SODIUM CHLORIDE 0.9 % IV BOLUS (SEPSIS)
500.0000 mL | Freq: Once | INTRAVENOUS | Status: AC
  Administered 2014-06-16: 500 mL via INTRAVENOUS

## 2014-06-16 NOTE — ED Notes (Addendum)
Per PTAR: Pt is from home. Pt c/o abdominal pain for two weeks. Pt is a hospice patient, and has a history of colon cancer and had her colon removed. It is reported that the patient did not follow up after having her colon removed. Pt has a DNR in place. Pt reports nausea, emesis, and chills.

## 2014-06-17 ENCOUNTER — Encounter (HOSPITAL_COMMUNITY): Payer: Self-pay | Admitting: Internal Medicine

## 2014-06-17 ENCOUNTER — Observation Stay (HOSPITAL_COMMUNITY): Payer: Medicare Other

## 2014-06-17 DIAGNOSIS — Z23 Encounter for immunization: Secondary | ICD-10-CM | POA: Diagnosis not present

## 2014-06-17 DIAGNOSIS — R221 Localized swelling, mass and lump, neck: Secondary | ICD-10-CM | POA: Diagnosis not present

## 2014-06-17 DIAGNOSIS — I509 Heart failure, unspecified: Secondary | ICD-10-CM | POA: Diagnosis present

## 2014-06-17 DIAGNOSIS — K1120 Sialoadenitis, unspecified: Secondary | ICD-10-CM | POA: Diagnosis present

## 2014-06-17 DIAGNOSIS — R220 Localized swelling, mass and lump, head: Secondary | ICD-10-CM | POA: Diagnosis not present

## 2014-06-17 DIAGNOSIS — R1084 Generalized abdominal pain: Secondary | ICD-10-CM | POA: Diagnosis not present

## 2014-06-17 DIAGNOSIS — Z933 Colostomy status: Secondary | ICD-10-CM | POA: Diagnosis not present

## 2014-06-17 DIAGNOSIS — D649 Anemia, unspecified: Secondary | ICD-10-CM | POA: Diagnosis present

## 2014-06-17 DIAGNOSIS — R109 Unspecified abdominal pain: Secondary | ICD-10-CM | POA: Diagnosis present

## 2014-06-17 DIAGNOSIS — N390 Urinary tract infection, site not specified: Secondary | ICD-10-CM | POA: Diagnosis present

## 2014-06-17 DIAGNOSIS — F0390 Unspecified dementia without behavioral disturbance: Secondary | ICD-10-CM | POA: Diagnosis present

## 2014-06-17 DIAGNOSIS — Z79899 Other long term (current) drug therapy: Secondary | ICD-10-CM | POA: Diagnosis not present

## 2014-06-17 DIAGNOSIS — J449 Chronic obstructive pulmonary disease, unspecified: Secondary | ICD-10-CM | POA: Diagnosis present

## 2014-06-17 DIAGNOSIS — A498 Other bacterial infections of unspecified site: Secondary | ICD-10-CM | POA: Diagnosis present

## 2014-06-17 DIAGNOSIS — I10 Essential (primary) hypertension: Secondary | ICD-10-CM | POA: Diagnosis present

## 2014-06-17 DIAGNOSIS — F17200 Nicotine dependence, unspecified, uncomplicated: Secondary | ICD-10-CM | POA: Diagnosis present

## 2014-06-17 DIAGNOSIS — E119 Type 2 diabetes mellitus without complications: Secondary | ICD-10-CM | POA: Diagnosis present

## 2014-06-17 DIAGNOSIS — I69959 Hemiplegia and hemiparesis following unspecified cerebrovascular disease affecting unspecified side: Secondary | ICD-10-CM | POA: Diagnosis not present

## 2014-06-17 DIAGNOSIS — Z85038 Personal history of other malignant neoplasm of large intestine: Secondary | ICD-10-CM | POA: Diagnosis not present

## 2014-06-17 DIAGNOSIS — Z66 Do not resuscitate: Secondary | ICD-10-CM | POA: Diagnosis present

## 2014-06-17 DIAGNOSIS — C189 Malignant neoplasm of colon, unspecified: Secondary | ICD-10-CM | POA: Diagnosis not present

## 2014-06-17 DIAGNOSIS — I4891 Unspecified atrial fibrillation: Secondary | ICD-10-CM | POA: Diagnosis present

## 2014-06-17 LAB — GLUCOSE, CAPILLARY
Glucose-Capillary: 136 mg/dL — ABNORMAL HIGH (ref 70–99)
Glucose-Capillary: 121 mg/dL — ABNORMAL HIGH (ref 70–99)
Glucose-Capillary: 258 mg/dL — ABNORMAL HIGH (ref 70–99)
Glucose-Capillary: 149 mg/dL — ABNORMAL HIGH (ref 70–99)

## 2014-06-17 LAB — CBC WITH DIFFERENTIAL/PLATELET
BASOS ABS: 0.1 10*3/uL (ref 0.0–0.1)
BASOS PCT: 1 % (ref 0–1)
Eosinophils Absolute: 0.4 10*3/uL (ref 0.0–0.7)
Eosinophils Relative: 4 % (ref 0–5)
HEMATOCRIT: 33.8 % — AB (ref 36.0–46.0)
Hemoglobin: 11 g/dL — ABNORMAL LOW (ref 12.0–15.0)
Lymphocytes Relative: 29 % (ref 12–46)
Lymphs Abs: 3.2 10*3/uL (ref 0.7–4.0)
MCH: 27 pg (ref 26.0–34.0)
MCHC: 32.5 g/dL (ref 30.0–36.0)
MCV: 83 fL (ref 78.0–100.0)
Monocytes Absolute: 0.8 10*3/uL (ref 0.1–1.0)
Monocytes Relative: 7 % (ref 3–12)
NEUTROS ABS: 6.5 10*3/uL (ref 1.7–7.7)
NEUTROS PCT: 59 % (ref 43–77)
PLATELETS: 400 10*3/uL (ref 150–400)
RBC: 4.07 MIL/uL (ref 3.87–5.11)
RDW: 18.4 % — AB (ref 11.5–15.5)
WBC: 10.9 10*3/uL — ABNORMAL HIGH (ref 4.0–10.5)

## 2014-06-17 LAB — IRON AND TIBC
IRON: 53 ug/dL (ref 42–135)
SATURATION RATIOS: 26 % (ref 20–55)
TIBC: 205 ug/dL — AB (ref 250–470)
UIBC: 152 ug/dL (ref 125–400)

## 2014-06-17 LAB — RETICULOCYTES
RBC.: 4.07 MIL/uL (ref 3.87–5.11)
RETIC CT PCT: 1.4 % (ref 0.4–3.1)
Retic Count, Absolute: 57 10*3/uL (ref 19.0–186.0)

## 2014-06-17 LAB — LIPID PANEL
Cholesterol: 182 mg/dL (ref 0–200)
HDL: 58 mg/dL (ref >39)
LDL CALC: 100 mg/dL — AB (ref 0–99)
Total CHOL/HDL Ratio: 3.1 RATIO
Triglycerides: 121 mg/dL (ref <150)
VLDL: 24 mg/dL (ref 0–40)

## 2014-06-17 LAB — FERRITIN: FERRITIN: 99 ng/mL (ref 10–291)

## 2014-06-17 LAB — COMPREHENSIVE METABOLIC PANEL
ALK PHOS: 64 U/L (ref 39–117)
ALT: 5 U/L (ref 0–35)
AST: 13 U/L (ref 0–37)
Albumin: 2.8 g/dL — ABNORMAL LOW (ref 3.5–5.2)
Anion gap: 14 (ref 5–15)
BUN: 27 mg/dL — ABNORMAL HIGH (ref 6–23)
CHLORIDE: 102 meq/L (ref 96–112)
CO2: 22 meq/L (ref 19–32)
Calcium: 8.5 mg/dL (ref 8.4–10.5)
Creatinine, Ser: 0.72 mg/dL (ref 0.50–1.10)
GFR calc non Af Amer: 78 mL/min — ABNORMAL LOW (ref >90)
Glucose, Bld: 130 mg/dL — ABNORMAL HIGH (ref 70–99)
Potassium: 3.5 mEq/L — ABNORMAL LOW (ref 3.7–5.3)
SODIUM: 138 meq/L (ref 137–147)
Total Bilirubin: 0.3 mg/dL (ref 0.3–1.2)
Total Protein: 7.5 g/dL (ref 6.0–8.3)

## 2014-06-17 LAB — HEMOGLOBIN A1C
Hgb A1c MFr Bld: 7.1 % — ABNORMAL HIGH (ref <5.7)
Mean Plasma Glucose: 157 mg/dL — ABNORMAL HIGH (ref <117)

## 2014-06-17 LAB — VITAMIN B12: Vitamin B-12: 402 pg/mL (ref 211–911)

## 2014-06-17 LAB — FOLATE: Folate: 17.8 ng/mL

## 2014-06-17 MED ORDER — ACETAMINOPHEN 650 MG RE SUPP: 650.0000 mg | Freq: Four times a day (QID) | RECTAL | Status: DC | PRN

## 2014-06-17 MED ORDER — SODIUM CHLORIDE 0.9 % IV BOLUS (SEPSIS)
500.0000 mL | Freq: Once | INTRAVENOUS | Status: AC
  Administered 2014-06-17: 500 mL via INTRAVENOUS

## 2014-06-17 MED ORDER — TRAZODONE HCL 150 MG PO TABS
150.0000 mg | ORAL_TABLET | Freq: Every day | ORAL | Status: DC
  Administered 2014-06-17 – 2014-06-20 (×4): 150 mg via ORAL
  Filled 2014-06-17 (×5): qty 1

## 2014-06-17 MED ORDER — MORPHINE SULFATE 2 MG/ML IJ SOLN
1.0000 mg | INTRAMUSCULAR | Status: DC | PRN
  Administered 2014-06-18 – 2014-06-20 (×5): 1 mg via INTRAVENOUS
  Filled 2014-06-17 (×5): qty 1

## 2014-06-17 MED ORDER — HYDRALAZINE HCL 20 MG/ML IJ SOLN
10.0000 mg | INTRAMUSCULAR | Status: DC | PRN
  Administered 2014-06-17: 10 mg via INTRAVENOUS
  Filled 2014-06-17: qty 1

## 2014-06-17 MED ORDER — DEXTROSE 5 % IV SOLN
1.0000 g | INTRAVENOUS | Status: DC
  Administered 2014-06-17 – 2014-06-18 (×2): 1 g via INTRAVENOUS
  Filled 2014-06-17 (×3): qty 10

## 2014-06-17 MED ORDER — LORAZEPAM 1 MG PO TABS
1.0000 mg | ORAL_TABLET | Freq: Once | ORAL | Status: AC
  Administered 2014-06-17: 1 mg via ORAL
  Filled 2014-06-17: qty 1

## 2014-06-17 MED ORDER — SODIUM CHLORIDE 0.9 % IV SOLN
INTRAVENOUS | Status: AC
  Administered 2014-06-17: 03:00:00 via INTRAVENOUS

## 2014-06-17 MED ORDER — NORTRIPTYLINE HCL 25 MG PO CAPS
150.0000 mg | ORAL_CAPSULE | Freq: Every day | ORAL | Status: DC
  Administered 2014-06-17 – 2014-06-20 (×4): 150 mg via ORAL
  Filled 2014-06-17 (×5): qty 6

## 2014-06-17 MED ORDER — ONDANSETRON HCL 4 MG PO TABS
4.0000 mg | ORAL_TABLET | Freq: Four times a day (QID) | ORAL | Status: DC | PRN
Start: 2014-06-17 — End: 2014-06-21
  Administered 2014-06-17: 4 mg via ORAL
  Filled 2014-06-17: qty 1

## 2014-06-17 MED ORDER — ACETAMINOPHEN 325 MG PO TABS
650.0000 mg | ORAL_TABLET | Freq: Four times a day (QID) | ORAL | Status: DC | PRN
  Administered 2014-06-17 – 2014-06-19 (×5): 650 mg via ORAL
  Filled 2014-06-17 (×5): qty 2

## 2014-06-17 MED ORDER — LISINOPRIL 5 MG PO TABS
5.0000 mg | ORAL_TABLET | Freq: Every day | ORAL | Status: DC
  Administered 2014-06-17 – 2014-06-20 (×4): 5 mg via ORAL
  Filled 2014-06-17 (×5): qty 1

## 2014-06-17 MED ORDER — MORPHINE SULFATE 2 MG/ML IJ SOLN
2.0000 mg | INTRAMUSCULAR | Status: DC | PRN
  Administered 2014-06-17 (×2): 2 mg via INTRAVENOUS
  Filled 2014-06-17 (×2): qty 1

## 2014-06-17 MED ORDER — APIXABAN 5 MG PO TABS
5.0000 mg | ORAL_TABLET | Freq: Two times a day (BID) | ORAL | Status: DC
  Administered 2014-06-17 – 2014-06-21 (×9): 5 mg via ORAL
  Filled 2014-06-17 (×10): qty 1

## 2014-06-17 MED ORDER — METOPROLOL TARTRATE 12.5 MG HALF TABLET
12.5000 mg | ORAL_TABLET | Freq: Two times a day (BID) | ORAL | Status: DC
  Administered 2014-06-17 – 2014-06-20 (×9): 12.5 mg via ORAL
  Filled 2014-06-17 (×11): qty 1

## 2014-06-17 MED ORDER — ONDANSETRON HCL 4 MG/2ML IJ SOLN
4.0000 mg | Freq: Four times a day (QID) | INTRAMUSCULAR | Status: DC | PRN
  Administered 2014-06-18 – 2014-06-19 (×3): 4 mg via INTRAVENOUS
  Filled 2014-06-17 (×3): qty 2

## 2014-06-17 MED ORDER — INSULIN ASPART 100 UNIT/ML ~~LOC~~ SOLN
0.0000 [IU] | Freq: Three times a day (TID) | SUBCUTANEOUS | Status: DC
  Administered 2014-06-17: 1 [IU] via SUBCUTANEOUS
  Administered 2014-06-17: 5 [IU] via SUBCUTANEOUS
  Administered 2014-06-17 – 2014-06-18 (×3): 1 [IU] via SUBCUTANEOUS
  Administered 2014-06-18: 3 [IU] via SUBCUTANEOUS
  Administered 2014-06-19 – 2014-06-20 (×6): 1 [IU] via SUBCUTANEOUS
  Administered 2014-06-21: 2 [IU] via SUBCUTANEOUS

## 2014-06-17 MED ORDER — NITROGLYCERIN 0.4 MG SL SUBL: 0.4000 mg | SUBLINGUAL_TABLET | SUBLINGUAL | Status: DC | PRN

## 2014-06-17 NOTE — Progress Notes (Signed)
PROGRESS NOTE    [name] NWG:[id number] DOB: 08/06/1930 DOA: 06/16/2014 PCP: Leonard Downing, MD  HPI/Brief narrative 80 y.o. female with history of colon cancer status post resection in last December 2014 with colostomy bag placement, history of hypertension, paroxysmal atrial fibrillation, diabetes mellitus presented to the ED with complaints of abdominal pain of 2 weeks' duration, on and off nausea and vomiting. She was seen in ED 2 weeks ago and completed a course of antibiotics without significant improvement. In the ED CT abdomen and pelvis unremarkable. UA suggests UTI. Patient apparently has not been taking medications for her other medical conditions for a few months.   Assessment/Plan:  1. Possible UTI: Continue IV Rocephin pending urine culture results. CT abdomen without acute findings. Abdominal pain possibly secondary to this and has currently resolved. 2. History of colon cancer status post resection and colostomy bag: OP follow up with Dr. Benay Spice. 3. History of PAF: Currently in sinus rhythm. Noncompliant with metoprolol and anticoagulation. Admitting M.D. discussed with patient's daughter and resumed metoprolol and Apixaban. 4. Hypertension: Uncontrolled on admission-better now. 5. Anemia: Stable. 6. History of DM 2: Reasonable inpatient control. Continue SSI. 7. History of CVA with right hemiparesis: No residual deficits. 8. History of dementia: Memory deficits.   Code Status: DO NOT RESUSCITATE Family Communication: None at bedside Disposition Plan: Home in medically stable   Consultants:  None  Procedures:  None  Antibiotics:  IV Rocephin   Subjective: Feels better. Denied abdominal pain this morning.  Objective: Filed Vitals:   06/16/14 2354 06/17/14 0216 06/17/14 0457 06/17/14 1439  BP: 189/85 187/57 136/65 162/42  Pulse: 87 81 96 68  Temp: 98.4 F (36.9 C) 98.8 F (37.1 C) 98 F (36.7 C) 98.4 F (36.9 C)  TempSrc: Oral Oral  Oral Oral  Resp: 18 16 20 18   Height:  5\' 2"  (1.575 m)    Weight:  67.223 kg (148 lb 3.2 oz)    SpO2: 95% 98% 98% 99%    Intake/Output Summary (Last 24 hours) at 06/17/14 1626 Last data filed at 06/17/14 1000  Gross per 24 hour  Intake 388.75 ml  Output      0 ml  Net 388.75 ml   Filed Weights   06/17/14 0216  Weight: 67.223 kg (148 lb 3.2 oz)     Exam:  General exam: Pleasant elderly female lying comfortably in bed. Respiratory system: Clear. No increased work of breathing. Cardiovascular system: S1 & S2 heard, RRR. No JVD, murmurs, gallops, clicks or pedal edema. Gastrointestinal system: Abdomen is nondistended, soft and nontender. Colostomy intact with small soft stool. Normal bowel sounds heard. Central nervous system: Alert and oriented x2. No focal neurological deficits. Extremities: Symmetric 5 x 5 power.   Data Reviewed: Basic Metabolic Panel:  Recent Labs Lab 06/16/14 2100 06/17/14 0513  NA 142 138  K 3.8 3.5*  CL 102 102  CO2 25 22  GLUCOSE 159* 130*  BUN 33* 27*  CREATININE 0.86 0.72  CALCIUM 9.0 8.5   Liver Function Tests:  Recent Labs Lab 06/16/14 2100 06/17/14 0513  AST 15 13  ALT 6 5  ALKPHOS 76 64  BILITOT <0.2* 0.3  PROT 7.7 7.5  ALBUMIN 2.9* 2.8*   No results found for this basename: LIPASE, AMYLASE,  in the last 168 hours No results found for this basename: AMMONIA,  in the last 168 hours CBC:  Recent Labs Lab 06/16/14 2100 06/17/14 0633  WBC 10.2 10.9*  NEUTROABS 5.8 6.5  HGB 11.3* 11.0*  HCT 36.4 33.8*  MCV 84.5 83.0  PLT 417* 400   Cardiac Enzymes: No results found for this basename: CKTOTAL, CKMB, CKMBINDEX, TROPONINI,  in the last 168 hours BNP (last 3 results)  Recent Labs  09/03/13 0638  PROBNP 1298.0*   CBG:  Recent Labs Lab 06/17/14 0810 06/17/14 1201  GLUCAP 136* 121*    No results found for this or any previous visit (from the past 240 hour(s)).        Studies: Ct Abdomen Pelvis W  Contrast  06/16/2014   CLINICAL DATA:  Abdominal pain for 2 weeks, history of colon cancer  EXAM: CT ABDOMEN AND PELVIS WITH CONTRAST  TECHNIQUE: Multidetector CT imaging of the abdomen and pelvis was performed using the standard protocol following bolus administration of intravenous contrast.  CONTRAST:  6mL OMNIPAQUE IOHEXOL 300 MG/ML SOLN, 123mL OMNIPAQUE IOHEXOL 300 MG/ML SOLN  COMPARISON:  01/31/2014  FINDINGS: Bibasilar atelectasis.  No acute musculoskeletal findings.  Sub cm low-attenuation lesion caudate lobe of the liver posterior to the right portal vein, stable. Liver otherwise normal. Gallbladder sludge or mildly calcified gallstones with several foci of hyperattenuation within the gallbladder lumen, stable. Splenic calcification stable. Pancreatic atrophy stable. Adrenal glands normal. Bilateral renal vascular calcification and mild cortical atrophy stable.  The patient is status post transverse colostomy. Colostomy site unremarkable. Distal colon decompressed. No abnormally dilated loops of bowel.  Bladder wall thickening diffusely, stable. No ascites or significant adenopathy. Extensive atherosclerotic calcification of the aorta with calcification and plaque. Infrarenal abdominal aorta dilated to 3.1 cm.  IMPRESSION: Stable postoperative change with no acute abnormalities. Colostomy site unremarkable. Multiple nonacute findings stable.   Electronically Signed   By: Skipper Cliche M.D.   On: 06/16/2014 23:29   Dg Chest Port 1 View  06/17/2014   CLINICAL DATA:  Infiltrates  EXAM: PORTABLE CHEST - 1 VIEW  COMPARISON:  09/07/2013  FINDINGS: No cardiomegaly. There is chronic tortuosity of the aorta, with particularly prominent aortic arch. Note that multiple saccular aneurysms were noted from the aortic arch and descending aorta on chest CT 02/14/2012.  Bandlike opacity at the medial right base, consistent with atelectasis or scar. There is no edema, consolidation, effusion, or pneumothorax.   IMPRESSION: Scarring or mild atelectasis at the right base. No edema or pneumonia.   Electronically Signed   By: Jorje Guild M.D.   On: 06/17/2014 02:10        Scheduled Meds: . apixaban  5 mg Oral BID  . cefTRIAXone (ROCEPHIN)  IV  1 g Intravenous Q24H  . insulin aspart  0-9 Units Subcutaneous TID WC  . lisinopril  5 mg Oral Daily  . metoprolol tartrate  12.5 mg Oral BID  . nortriptyline  150 mg Oral QHS  . traZODone  150 mg Oral QHS   Continuous Infusions:   Principal Problem:   Abdominal pain Active Problems:   Paroxysmal atrial fibrillation   Adenocarcinoma, colon   UTI (lower urinary tract infection)   Hypertension   Anemia    Time spent: 25 minutes    HONGALGI,ANAND, MD, FACP, FHM. [facility] Pager [phone]  If 7PM-7AM, please contact night-coverage [email or web address] Password [email or web address] 06/17/2014, 4:26 PM    LOS: 1 day

## 2014-06-17 NOTE — Progress Notes (Signed)
UR completed 

## 2014-06-17 NOTE — Progress Notes (Signed)
Patient is pleasant and very forgetful. She asked me several times within minutes of each other where her daughter was. I told her that her daughter had been here last night and this morning and had left for a little while and would be back (staff provided this information). Patient did not retain this information. She told me she was "just miserable;" when asked about whether her misery was about pain, she said, "that is the only thing I'm not miserable about." She said, "I am lost, so lost, and I don't know where my daughter is." Will bring her a prayer shawl and see if that will give her something else to fix her attention on.  She blessed me so I asked if she would like prayer and she said yes. She appreciated it although she forgot I did it for her moments later.  Epifania Gore, PhD, [place]

## 2014-06-17 NOTE — Progress Notes (Signed)
Nutrition Brief Note  Patient identified on the Malnutrition Screening Tool (MST) Report  Wt Readings from Last 15 Encounters:  06/17/14 148 lb 3.2 oz (67.223 kg)  10/09/13 143 lb 11.2 oz (65.182 kg)  09/13/13 161 lb 4.8 oz (73.165 kg)  09/13/13 161 lb 4.8 oz (73.165 kg)  09/13/13 161 lb 4.8 oz (73.165 kg)  09/13/13 161 lb 4.8 oz (73.165 kg)    Body mass index is 27.1 kg/(m^2). Patient meets criteria for Overweight based on current BMI.   Current diet order is Heart healthy/carb mod, patient is consuming approximately 75% of meals at this time. Labs and medications reviewed.   Pt denied any unintentional wt loss pta. Has hx of colon cancer and with colostomy placement in 2014 which resulted in subsequent 10 lb weight loss; however, has been able to maintain weight for > 6 months. Endorsed a healthy appetite, no questions regarding nutrition and colostomies  No nutrition interventions warranted at this time. If nutrition issues arise, please consult RD.   Atlee Abide MS RD LDN Clinical Dietitian [phone]

## 2014-06-17 NOTE — Progress Notes (Signed)
ANTIBIOTIC CONSULT NOTE - INITIAL  Pharmacy Consult for Ceftriaxone Indication: UTI  Allergies  Allergen Reactions  . Aggrenox [Aspirin-Dipyridamole Er] Diarrhea and Nausea And Vomiting    & abdominal bleeding    Patient Measurements: Height: 5\' 2"  (157.5 cm) Weight: 148 lb 3.2 oz (67.223 kg) IBW/kg (Calculated) : 50.1 Adjusted Body Weight:   Vital Signs: Temp: 98.8 F (37.1 C) (09/21 0216) Temp src: Oral (09/21 0216) BP: 187/57 mmHg (09/21 0216) Pulse Rate: 81 (09/21 0216) Intake/Output from previous day:   Intake/Output from this shift:    Labs:  Recent Labs  06/16/14 2100  WBC 10.2  HGB 11.3*  PLT 417*  CREATININE 0.86   Estimated Creatinine Clearance: 45.3 ml/min (by C-G formula based on Cr of 0.86). No results found for this basename: VANCOTROUGH, VANCOPEAK, VANCORANDOM, GENTTROUGH, GENTPEAK, GENTRANDOM, TOBRATROUGH, TOBRAPEAK, TOBRARND, AMIKACINPEAK, AMIKACINTROU, AMIKACIN,  in the last 72 hours   Microbiology: No results found for this or any previous visit (from the past 720 hour(s)).  Medical History: Past Medical History  Diagnosis Date  . COPD (chronic obstructive pulmonary disease)   . CHF (congestive heart failure)     ECHO 2011  EF: 60% to 39%, grd 1 distolic dysfxn, 53-20% 2334 ECHO, tech limited by Afib  . Hypertension   . Stroke 2008    rt side deficit  . A-fib   . Diabetes mellitus   . Back pain   . Constipation   . SBO (small bowel obstruction)     per GI note in 2011, pt denies  . Upper GI bleed 2011    required hospitalization  . Colon polyps   . Tobacco abuse     Medications:  Anti-infectives   Start     Dose/Rate Route Frequency Ordered Stop   06/17/14 2200  cefTRIAXone (ROCEPHIN) 1 g in dextrose 5 % 50 mL IVPB     1 g 100 mL/hr over 30 Minutes Intravenous Every 24 hours 06/17/14 0228     06/16/14 2330  cefTRIAXone (ROCEPHIN) 1 g in dextrose 5 % 50 mL IVPB     1 g 100 mL/hr over 30 Minutes Intravenous  Once 06/16/14 2325  06/17/14 0106     Assessment: Patient with UTI.  First dose of antibiotics already given.   Goal of Therapy:  Ceftriaxone dosed based on patient weight and renal function   Plan:  Follow up culture results Ceftriaxone 1gm iv q24hr  Nani Skillern Crowford 06/17/2014,4:51 AM

## 2014-06-17 NOTE — Progress Notes (Signed)
ANTICOAGULATION CONSULT NOTE - Initial Consult  Pharmacy Consult for Apixaban Indication: atrial fibrillation  Allergies  Allergen Reactions  . Aggrenox [Aspirin-Dipyridamole Er] Diarrhea and Nausea And Vomiting    & abdominal bleeding    Patient Measurements: Height: 5\' 2"  (157.5 cm) Weight: 148 lb 3.2 oz (67.223 kg) IBW/kg (Calculated) : 50.1 Heparin Dosing Weight:   Vital Signs: Temp: 98.8 F (37.1 C) (09/21 0216) Temp src: Oral (09/21 0216) BP: 187/57 mmHg (09/21 0216) Pulse Rate: 81 (09/21 0216)  Labs:  Recent Labs  06/16/14 2100  HGB 11.3*  HCT 36.4  PLT 417*  CREATININE 0.86    Estimated Creatinine Clearance: 45.3 ml/min (by C-G formula based on Cr of 0.86).   Medical History: Past Medical History  Diagnosis Date  . COPD (chronic obstructive pulmonary disease)   . CHF (congestive heart failure)     ECHO 2011  EF: 60% to 34%, grd 1 distolic dysfxn, 28-76% 8115 ECHO, tech limited by Afib  . Hypertension   . Stroke 2008    rt side deficit  . A-fib   . Diabetes mellitus   . Back pain   . Constipation   . SBO (small bowel obstruction)     per GI note in 2011, pt denies  . Upper GI bleed 2011    required hospitalization  . Colon polyps   . Tobacco abuse     Medications:  Scheduled:  . apixaban  5 mg Oral BID  . cefTRIAXone (ROCEPHIN)  IV  1 g Intravenous Q24H  . insulin aspart  0-9 Units Subcutaneous TID WC  . lisinopril  5 mg Oral Daily  . metoprolol tartrate  12.5 mg Oral BID  . nortriptyline  150 mg Oral QHS  . traZODone  150 mg Oral QHS    Assessment: Patient with afib, MD wants to restart back on apixaban.  Per Med rec and MD has not taken for > 30 days. Goal of Therapy:  Apixaban dosed based on patient weight, age and renal function     Plan:  Apixaban 5mg  po q12hr  Nani Skillern Crowford 06/17/2014,4:54 AM

## 2014-06-17 NOTE — ED Provider Notes (Signed)
CSN: [id number]     Arrival date & time 06/16/14  1940 History   First MD Initiated Contact with Patient 06/16/14 2002     Chief Complaint  Patient presents with  . Abdominal Pain     (Consider location/radiation/quality/duration/timing/severity/associated sxs/prior Treatment) Patient is a 80 y.o. female presenting with abdominal pain. The history is provided by the patient and a relative. No language interpreter was used.  Abdominal Pain Pain location:  Suprapubic, LLQ and RLQ Pain quality: aching   Pain radiates to:  Does not radiate Pain severity:  Severe Onset quality:  Gradual Duration:  2 weeks Timing:  Constant Progression:  Worsening Chronicity:  Recurrent Context comment:  Colon resection in May Relieved by:  Nothing Worsened by:  Nothing tried Ineffective treatments:  None tried Associated symptoms: chills, fatigue and nausea   Associated symptoms: no chest pain, no cough, no diarrhea, no dysuria, no fever, no hematuria, no shortness of breath, no sore throat and no vomiting   Risk factors: being elderly, multiple surgeries and obesity   Risk factors: not pregnant     Past Medical History  Diagnosis Date  . COPD (chronic obstructive pulmonary disease)   . CHF (congestive heart failure)     ECHO 2011  EF: 60% to 38%, grd 1 distolic dysfxn, 18-29% 9371 ECHO, tech limited by Afib  . Hypertension   . Stroke 2008    rt side deficit  . A-fib   . Diabetes mellitus   . Back pain   . Constipation   . SBO (small bowel obstruction)     per GI note in 2011, pt denies  . Upper GI bleed 2011    required hospitalization  . Colon polyps   . Tobacco abuse    Past Surgical History  Procedure Laterality Date  . Total abdominal hysterectomy  1980  . Colonoscopy N/A 09/04/2013    Procedure: COLONOSCOPY;  Surgeon: Lear Ng, MD;  Location: [facility] ENDOSCOPY;  Service: Endoscopy;  Laterality: N/A;  . Colon resection N/A 09/06/2013    Procedure: PARTIAL COLECTOMY WITH  COLOSTOMY;  Surgeon: Earnstine Regal, MD;  Location: [place];  Service: General;  Laterality: N/A;  . Colostomy revision N/A 09/08/2013    Procedure: COLOSTOMY REVISION;  Surgeon: Gwenyth Ober, MD;  Location: [place];  Service: General;  Laterality: N/A;   History reviewed. No pertinent family history. History  Substance Use Topics  . Smoking status: Current Every Day Smoker -- 0.50 packs/day for 15 years    Types: Cigarettes  . Smokeless tobacco: Not on file  . Alcohol Use: Yes   OB History   Grav Para Term Preterm Abortions TAB SAB Ect Mult Living                 Review of Systems  Constitutional: Positive for chills and fatigue. Negative for fever, diaphoresis, activity change and appetite change.  HENT: Negative for congestion, facial swelling, rhinorrhea and sore throat.   Eyes: Negative for photophobia and discharge.  Respiratory: Negative for cough, chest tightness and shortness of breath.   Cardiovascular: Negative for chest pain, palpitations and leg swelling.  Gastrointestinal: Positive for nausea and abdominal pain. Negative for vomiting and diarrhea.  Endocrine: Negative for polydipsia and polyuria.  Genitourinary: Negative for dysuria, frequency, hematuria, difficulty urinating and pelvic pain.  Musculoskeletal: Negative for arthralgias, back pain, neck pain and neck stiffness.  Skin: Negative for color change and wound.  Allergic/Immunologic: Negative for immunocompromised state.  Neurological: Negative for facial  asymmetry, weakness, numbness and headaches.  Hematological: Does not bruise/bleed easily.  Psychiatric/Behavioral: Negative for confusion and agitation.      Allergies  Aggrenox  Home Medications   Prior to Admission medications   Medication Sig Start Date End Date Taking? Authorizing Provider  nortriptyline (PAMELOR) 75 MG capsule Take 150 mg by mouth at bedtime.   Yes Historical Provider, MD  traZODone (DESYREL) 150 MG tablet Take 150 mg by mouth at  bedtime.     Yes Historical Provider, MD  nitroGLYCERIN (NITROSTAT) 0.4 MG SL tablet Place 0.4 mg under the tongue every 5 (five) minutes as needed for chest pain.    Historical Provider, MD   BP 136/65  Pulse 96  Temp(Src) 98 F (36.7 C) (Oral)  Resp 20  Ht 5\' 2"  (1.575 m)  Wt 148 lb 3.2 oz (67.223 kg)  BMI 27.10 kg/m2  SpO2 98% Physical Exam  Constitutional: She is oriented to person, place, and time. She appears well-developed and well-nourished. No distress.  HENT:  Head: Normocephalic and atraumatic.  Mouth/Throat: No oropharyngeal exudate.  Eyes: Pupils are equal, round, and reactive to light.  Neck: Normal range of motion. Neck supple.  Cardiovascular: Normal rate, regular rhythm and normal heart sounds.  Exam reveals no gallop and no friction rub.   No murmur heard. Pulmonary/Chest: Effort normal and breath sounds normal. No respiratory distress. She has no wheezes. She has no rales.  Abdominal: Soft. Bowel sounds are normal. She exhibits no distension and no mass. There is tenderness in the right lower quadrant, suprapubic area and left lower quadrant. There is no rebound and no guarding.  Musculoskeletal: Normal range of motion. She exhibits no edema and no tenderness.  Neurological: She is alert and oriented to person, place, and time.  Skin: Skin is warm and dry.  Psychiatric: She has a normal mood and affect.    ED Course  Procedures (including critical care time) Labs Review Labs Reviewed  CBC WITH DIFFERENTIAL - Abnormal; Notable for the following:    Hemoglobin 11.3 (*)    RDW 18.4 (*)    Platelets 417 (*)    All other components within normal limits  COMPREHENSIVE METABOLIC PANEL - Abnormal; Notable for the following:    Glucose, Bld 159 (*)    BUN 33 (*)    Albumin 2.9 (*)    Total Bilirubin <0.2 (*)    GFR calc non Af Amer 61 (*)    GFR calc Af Amer 71 (*)    All other components within normal limits  URINALYSIS, ROUTINE W REFLEX MICROSCOPIC -  Abnormal; Notable for the following:    APPearance TURBID (*)    Hgb urine dipstick LARGE (*)    Protein, ur >300 (*)    Leukocytes, UA LARGE (*)    All other components within normal limits  LACTIC ACID, PLASMA - Abnormal; Notable for the following:    Lactic Acid, Venous 2.3 (*)    All other components within normal limits  URINE MICROSCOPIC-ADD ON - Abnormal; Notable for the following:    Squamous Epithelial / LPF FEW (*)    Bacteria, UA MANY (*)    All other components within normal limits  LIPID PANEL - Abnormal; Notable for the following:    LDL Cholesterol 100 (*)    All other components within normal limits  COMPREHENSIVE METABOLIC PANEL - Abnormal; Notable for the following:    Potassium 3.5 (*)    Glucose, Bld 130 (*)    BUN  27 (*)    Albumin 2.8 (*)    GFR calc non Af Amer 78 (*)    All other components within normal limits  IRON AND TIBC - Abnormal; Notable for the following:    TIBC 205 (*)    All other components within normal limits  CBC WITH DIFFERENTIAL - Abnormal; Notable for the following:    WBC 10.9 (*)    Hemoglobin 11.0 (*)    HCT 33.8 (*)    RDW 18.4 (*)    All other components within normal limits  GLUCOSE, CAPILLARY - Abnormal; Notable for the following:    Glucose-Capillary 136 (*)    All other components within normal limits  GLUCOSE, CAPILLARY - Abnormal; Notable for the following:    Glucose-Capillary 121 (*)    All other components within normal limits  URINE CULTURE  VITAMIN B12  FOLATE  FERRITIN  RETICULOCYTES  CBC WITH DIFFERENTIAL  CBC  HEMOGLOBIN A1C  I-STAT CG4 LACTIC ACID, ED    Imaging Review Ct Abdomen Pelvis W Contrast  06/16/2014   CLINICAL DATA:  Abdominal pain for 2 weeks, history of colon cancer  EXAM: CT ABDOMEN AND PELVIS WITH CONTRAST  TECHNIQUE: Multidetector CT imaging of the abdomen and pelvis was performed using the standard protocol following bolus administration of intravenous contrast.  CONTRAST:  81mL OMNIPAQUE  IOHEXOL 300 MG/ML SOLN, 179mL OMNIPAQUE IOHEXOL 300 MG/ML SOLN  COMPARISON:  01/31/2014  FINDINGS: Bibasilar atelectasis.  No acute musculoskeletal findings.  Sub cm low-attenuation lesion caudate lobe of the liver posterior to the right portal vein, stable. Liver otherwise normal. Gallbladder sludge or mildly calcified gallstones with several foci of hyperattenuation within the gallbladder lumen, stable. Splenic calcification stable. Pancreatic atrophy stable. Adrenal glands normal. Bilateral renal vascular calcification and mild cortical atrophy stable.  The patient is status post transverse colostomy. Colostomy site unremarkable. Distal colon decompressed. No abnormally dilated loops of bowel.  Bladder wall thickening diffusely, stable. No ascites or significant adenopathy. Extensive atherosclerotic calcification of the aorta with calcification and plaque. Infrarenal abdominal aorta dilated to 3.1 cm.  IMPRESSION: Stable postoperative change with no acute abnormalities. Colostomy site unremarkable. Multiple nonacute findings stable.   Electronically Signed   By: Skipper Cliche M.D.   On: 06/16/2014 23:29   Dg Chest Port 1 View  06/17/2014   CLINICAL DATA:  Infiltrates  EXAM: PORTABLE CHEST - 1 VIEW  COMPARISON:  09/07/2013  FINDINGS: No cardiomegaly. There is chronic tortuosity of the aorta, with particularly prominent aortic arch. Note that multiple saccular aneurysms were noted from the aortic arch and descending aorta on chest CT 02/14/2012.  Bandlike opacity at the medial right base, consistent with atelectasis or scar. There is no edema, consolidation, effusion, or pneumothorax.  IMPRESSION: Scarring or mild atelectasis at the right base. No edema or pneumonia.   Electronically Signed   By: Jorje Guild M.D.   On: 06/17/2014 02:10     EKG Interpretation None      MDM   Final diagnoses:  UTI (lower urinary tract infection)    Pt is a 80 y.o. female with Pmhx as above who presents with 2  weeks of ab pain that she states feels like the pain she was was having when her colon cancer was diagnosed.  She states she was treated for a UTI about 2 weeks ago but her pain did not improve. On physical exam patient is hypertensive but in no acute distress. She has suprapubic, left lower quadrant and right  lower quadrant abdominal pain on exam. No rebound or guarding. She has positive bowel sounds. Lactate elevated at 2.3. Urine appears infected. WBC is 10.2. CT with stable postoperative changes. She is bladder wall thickening. I spoken to Dr. Hal Hope who will evaluate her in the ED for disposition planning.   Pt will be admitted for obs.        Ernestina Patches, MD 06/17/14 1253

## 2014-06-17 NOTE — H&P (Signed)
[facility] History and Physical  [name]:[id number] DOB: 03-04-1930 DOA: 06/16/2014  Referring physician: ER physician. PCP: Leonard Downing, MD   Chief Complaint: Abdominal pain.  HPI: [name] is a 80 y.o. female with history of colon cancer status post resection in last December 2014 with colostomy bag placement, history of hypertension, paroxysmal atrial fibrillation, diabetes mellitus was brought to the ER by patient's daughter patient was complaining of lower abdominal pain. Patient has been having lower abdominal pain for last 2 weeks. Patient's daughter states that she has been having nausea and vomiting off and on. Patient had come to the ER 2 weeks ago and was given a course of antibiotics despite which patient was to having abdominal pain. In the ER patient had a CT abdomen pelvis done today which was unremarkable except for the known postoperative changes. Patient's pain was controlled after morphine. Patient UA shows features consistent with UTI. Patient has not been taking her other medications for A. fib and hypertension and diabetes for many months now. Patient will be admitted for observation. Patient's daughter was advised that patient will need followup with her oncologist.  Review of Systems: As presented in the history of presenting illness, rest negative.  Past Medical History  Diagnosis Date  . COPD (chronic obstructive pulmonary disease)   . CHF (congestive heart failure)     ECHO 2011  EF: 60% to 08%, grd 1 distolic dysfxn, 67-61% 9509 ECHO, tech limited by Afib  . Hypertension   . Stroke 2008    rt side deficit  . A-fib   . Diabetes mellitus   . Back pain   . Constipation   . SBO (small bowel obstruction)     per GI note in 2011, pt denies  . Upper GI bleed 2011    required hospitalization  . Colon polyps   . Tobacco abuse    Past Surgical History  Procedure Laterality Date  . Total abdominal hysterectomy  1980  .  Colonoscopy N/A 09/04/2013    Procedure: COLONOSCOPY;  Surgeon: Lear Ng, MD;  Location: [facility] ENDOSCOPY;  Service: Endoscopy;  Laterality: N/A;  . Colon resection N/A 09/06/2013    Procedure: PARTIAL COLECTOMY WITH COLOSTOMY;  Surgeon: Earnstine Regal, MD;  Location: [place];  Service: General;  Laterality: N/A;  . Colostomy revision N/A 09/08/2013    Procedure: COLOSTOMY REVISION;  Surgeon: Gwenyth Ober, MD;  Location: [place];  Service: General;  Laterality: N/A;   Social History:  reports that she has been smoking Cigarettes.  She has a 7.5 pack-year smoking history. She does not have any smokeless tobacco history on file. She reports that she drinks alcohol. She reports that she does not use illicit drugs. Where does patient live home. Can patient participate in ADLs? No.  Allergies  Allergen Reactions  . Aggrenox [Aspirin-Dipyridamole Er] Diarrhea and Nausea And Vomiting    & abdominal bleeding    Family History: History reviewed. No pertinent family history.    Prior to Admission medications   Medication Sig Start Date End Date Taking? Authorizing Provider  nortriptyline (PAMELOR) 75 MG capsule Take 150 mg by mouth at bedtime.   Yes Historical Provider, MD  traZODone (DESYREL) 150 MG tablet Take 150 mg by mouth at bedtime.     Yes Historical Provider, MD  nitroGLYCERIN (NITROSTAT) 0.4 MG SL tablet Place 0.4 mg under the tongue every 5 (five) minutes as needed for chest pain.    Historical Provider, MD  Physical Exam: Filed Vitals:   06/16/14 1947 06/16/14 1952 06/16/14 2158 06/16/14 2354  BP:  178/71  189/85  Pulse:  99 75 87  Temp:  99 F (37.2 C)  98.4 F (36.9 C)  TempSrc:  Oral  Oral  Resp:  22 16 18   SpO2: 96% 95% 96% 95%     General:  Well-developed and moderately nourished.  Eyes: Anicteric no pallor.  ENT: No discharge from ears eyes nose mouth.  Neck: No mass felt.  Cardiovascular: S1-S2 heard.  Respiratory: No rhonchi or crepitations.  Abdomen:  Soft nontender bowel sounds present. Colostomy bag seen.  Skin: No rash.  Musculoskeletal: No edema.  Psychiatric: Appears normal at this time.  Neurologic: Alert and oriented to time place and person. Right-sided hemiplegia.  Labs on Admission:  Basic Metabolic Panel:  Recent Labs Lab 06/16/14 2100  NA 142  K 3.8  CL 102  CO2 25  GLUCOSE 159*  BUN 33*  CREATININE 0.86  CALCIUM 9.0   Liver Function Tests:  Recent Labs Lab 06/16/14 2100  AST 15  ALT 6  ALKPHOS 76  BILITOT <0.2*  PROT 7.7  ALBUMIN 2.9*   No results found for this basename: LIPASE, AMYLASE,  in the last 168 hours No results found for this basename: AMMONIA,  in the last 168 hours CBC:  Recent Labs Lab 06/16/14 2100  WBC 10.2  NEUTROABS 5.8  HGB 11.3*  HCT 36.4  MCV 84.5  PLT 417*   Cardiac Enzymes: No results found for this basename: CKTOTAL, CKMB, CKMBINDEX, TROPONINI,  in the last 168 hours  BNP (last 3 results)  Recent Labs  09/03/13 0638  PROBNP 1298.0*   CBG: No results found for this basename: GLUCAP,  in the last 168 hours  Radiological Exams on Admission: Ct Abdomen Pelvis W Contrast  06/16/2014   CLINICAL DATA:  Abdominal pain for 2 weeks, history of colon cancer  EXAM: CT ABDOMEN AND PELVIS WITH CONTRAST  TECHNIQUE: Multidetector CT imaging of the abdomen and pelvis was performed using the standard protocol following bolus administration of intravenous contrast.  CONTRAST:  60mL OMNIPAQUE IOHEXOL 300 MG/ML SOLN, 118mL OMNIPAQUE IOHEXOL 300 MG/ML SOLN  COMPARISON:  01/31/2014  FINDINGS: Bibasilar atelectasis.  No acute musculoskeletal findings.  Sub cm low-attenuation lesion caudate lobe of the liver posterior to the right portal vein, stable. Liver otherwise normal. Gallbladder sludge or mildly calcified gallstones with several foci of hyperattenuation within the gallbladder lumen, stable. Splenic calcification stable. Pancreatic atrophy stable. Adrenal glands normal. Bilateral  renal vascular calcification and mild cortical atrophy stable.  The patient is status post transverse colostomy. Colostomy site unremarkable. Distal colon decompressed. No abnormally dilated loops of bowel.  Bladder wall thickening diffusely, stable. No ascites or significant adenopathy. Extensive atherosclerotic calcification of the aorta with calcification and plaque. Infrarenal abdominal aorta dilated to 3.1 cm.  IMPRESSION: Stable postoperative change with no acute abnormalities. Colostomy site unremarkable. Multiple nonacute findings stable.   Electronically Signed   By: Skipper Cliche M.D.   On: 06/16/2014 23:29     Assessment/Plan Principal Problem:   Abdominal pain Active Problems:   Paroxysmal atrial fibrillation   Adenocarcinoma, colon   UTI (lower urinary tract infection)   Hypertension   Anemia   1. Abdominal pain - pain is mostly suprapubic and suspect this could be from UTI. Patient will be continued on ceftriaxone and follow urine cultures and patient will be placed on when necessary pain medications. Continue gentle hydration. For now  I'm keeping patient on carb modified diet. 2. History of colon cancer status post resection and presently on colostomy bag - patient will need to followup with her oncologist Dr. Donneta Romberg. 3. History of paroxysmal atrial fibrillation - presently monitor shows sinus rhythm. Patient during her last discharge last December was advised to be on apixaban and was on metoprolol which patient is not taking now. I have discussed with patient's daughter who is okay with restarting patient's anticoagulants and also have started patient back on small dose of metoprolol. 4. Hypertension uncontrolled - patient used to be on lisinopril and I have restarted on small dose for now and closely follow blood pressure trends. 5. Anemia - follow CBC. Check anemia panel. 6. History of diabetes mellitus - presently on no medications. Check hemoglobin A1c. For now I have  placed patient on sliding-scale coverage. 7. History of CVA with right-sided hemiparesis. 8. History of dementia.    Code Status: DO NOT RESUSCITATE.  Family Communication: Patient's daughter at the bedside.  Disposition Plan: Admit for observation.    Cigi Bega N. [facility] Pager [phone].  If 7PM-7AM, please contact night-coverage [email or web address] Password [email or web address] 06/17/2014, 1:25 AM

## 2014-06-17 NOTE — Progress Notes (Signed)
Counseling intern brought the patient a prayer shawl, and sat with her for about 45 minutes. Patient was forgetful, and cried frequently without knowing why. However, patient was very happy when her daughter returned to the room.   Elson Clan Counseling intern [phone]

## 2014-06-18 LAB — GLUCOSE, CAPILLARY
GLUCOSE-CAPILLARY: 134 mg/dL — AB (ref 70–99)
GLUCOSE-CAPILLARY: 128 mg/dL — AB (ref 70–99)
Glucose-Capillary: 204 mg/dL — ABNORMAL HIGH (ref 70–99)
Glucose-Capillary: 128 mg/dL — ABNORMAL HIGH (ref 70–99)

## 2014-06-18 MED ORDER — LORAZEPAM 1 MG PO TABS
1.0000 mg | ORAL_TABLET | Freq: Every evening | ORAL | Status: DC | PRN
  Administered 2014-06-18 – 2014-06-19 (×2): 1 mg via ORAL
  Filled 2014-06-18 (×2): qty 1

## 2014-06-18 NOTE — Plan of Care (Signed)
Problem: Phase III Progression Outcomes Goal: Voiding independently Outcome: Completed/Met Date Met:  06/18/14 Incontinent

## 2014-06-18 NOTE — Care Management Note (Signed)
CARE MANAGEMENT NOTE 06/18/2014  Patient:  ARRIEL, VICTOR   Account Number:  [id number]  Date Initiated:  06/18/2014  Documentation initiated by:  Leafy Kindle  Subjective/Objective Assessment:   80 yo female admitted with abd pain     Action/Plan:   From home with daughter   Anticipated DC Date:  06/21/2014   Anticipated DC Plan:  [place]  CM consult      Choice offered to / List presented to:             Status of service:  In process, will continue to follow Medicare Important Message given?   (If response is "NO", the following Medicare IM given date fields will be blank) Date Medicare IM given:   Medicare IM given by:   Date Additional Medicare IM given:   Additional Medicare IM given by:    Discharge Disposition:    Per UR Regulation:  Reviewed for med. necessity/level of care/duration of stay  If discussed at [place] of Stay Meetings, dates discussed:    Comments:  06/18/14 Marney Doctor RN,BSN,NCM Pt was discharged from [facility] on 9/19 due to prognosis of longer than 6 months per Santiago Glad from [facility].  CM will continue to follow for DC needs.
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
## 2014-06-18 NOTE — Progress Notes (Signed)
Introduced Forensic scientist to patient and patient's daughter. They were so glad to see the therapist and they talked about their dogs at home. It brought the patient joy in the present.  Epifania Gore, PhD, [place]

## 2014-06-18 NOTE — Progress Notes (Addendum)
PROGRESS NOTE    [name] QHU:[id number] DOB: 07/25/1930 DOA: 06/16/2014 PCP: Leonard Downing, MD  HPI/Brief narrative 80 y.o. female with history of colon cancer status post resection in last December 2014 with colostomy bag placement, history of hypertension, paroxysmal atrial fibrillation, diabetes mellitus presented to the ED with complaints of abdominal pain of 2 weeks' duration, on and off nausea and vomiting. She was seen in ED 2 weeks ago and completed a course of antibiotics without significant improvement. In the ED CT abdomen and pelvis unremarkable. UA suggests UTI. Patient apparently has not been taking medications for her other medical conditions for a few months.   Assessment/Plan:  1. Escherichia coli UTI: Continue IV Rocephin pending urine culture results. CT abdomen without acute findings. Abdominal pain possibly secondary to this and has currently resolved. 2. History of colon cancer status post resection and colostomy bag: OP follow up with Dr. Benay Spice. 3. History of PAF: Currently in sinus rhythm. Noncompliant with metoprolol and anticoagulation. Admitting M.D. discussed with patient's daughter and resumed metoprolol and Apixaban. 4. Hypertension: Uncontrolled on admission-better now. 5. Anemia: Stable. 6. History of DM 2: Reasonable inpatient control. Continue SSI. 7. History of CVA with right hemiparesis: No residual deficits. 8. History of dementia: Memory deficits. Periodically confused.   Code Status: DO NOT RESUSCITATE Family Communication: Discussed with daughter Ms. [name]. Disposition Plan: PT and OT evaluation.? Home when medically stable.   Consultants:  None  Procedures:  None  Antibiotics:  IV Rocephin   Subjective: Complains of generalized body ache. Asking for food. Denies abdominal pain. As per nursing, intermittent confusion.  Objective: Filed Vitals:   06/17/14 2031 06/17/14 2050 06/18/14 0441 06/18/14 1245  BP:  133/71  101/41 116/33  Pulse: 54 62 72 64  Temp: 98.4 F (36.9 C)  98.1 F (36.7 C) 98.3 F (36.8 C)  TempSrc: Oral  Oral Oral  Resp: 18  18 18   Height:      Weight:   68.947 kg (152 lb)   SpO2: 99%  97% 99%    Intake/Output Summary (Last 24 hours) at 06/18/14 1459 Last data filed at 06/18/14 1430  Gross per 24 hour  Intake    950 ml  Output    100 ml  Net    850 ml   Filed Weights   06/17/14 0216 06/18/14 0441  Weight: 67.223 kg (148 lb 3.2 oz) 68.947 kg (152 lb)     Exam:  General exam: Pleasant elderly female lying comfortably in bed. Respiratory system: Clear. No increased work of breathing. Cardiovascular system: S1 & S2 heard, RRR. No JVD, murmurs, gallops, clicks or pedal edema. Gastrointestinal system: Abdomen is nondistended, soft and nontender. Colostomy intact with small soft stool. Normal bowel sounds heard. Central nervous system: Alert and oriented x2. No focal neurological deficits. Extremities: Symmetric 5 x 5 power.   Data Reviewed: Basic Metabolic Panel:  Recent Labs Lab 06/16/14 2100 06/17/14 0513  NA 142 138  K 3.8 3.5*  CL 102 102  CO2 25 22  GLUCOSE 159* 130*  BUN 33* 27*  CREATININE 0.86 0.72  CALCIUM 9.0 8.5   Liver Function Tests:  Recent Labs Lab 06/16/14 2100 06/17/14 0513  AST 15 13  ALT 6 5  ALKPHOS 76 64  BILITOT <0.2* 0.3  PROT 7.7 7.5  ALBUMIN 2.9* 2.8*   No results found for this basename: LIPASE, AMYLASE,  in the last 168 hours No results found for this basename: AMMONIA,  in the  last 168 hours CBC:  Recent Labs Lab 06/16/14 2100 06/17/14 0633  WBC 10.2 10.9*  NEUTROABS 5.8 6.5  HGB 11.3* 11.0*  HCT 36.4 33.8*  MCV 84.5 83.0  PLT 417* 400   Cardiac Enzymes: No results found for this basename: CKTOTAL, CKMB, CKMBINDEX, TROPONINI,  in the last 168 hours BNP (last 3 results)  Recent Labs  09/03/13 0638  PROBNP 1298.0*   CBG:  Recent Labs Lab 06/17/14 1201 06/17/14 1701 06/17/14 2143  06/18/14 0731 06/18/14 1129  GLUCAP 121* 258* 149* 134* 128*    Recent Results (from the past 240 hour(s))  URINE CULTURE     Status: None   Collection Time    06/16/14  8:27 PM      Result Value Ref Range Status   Specimen Description URINE, CATHETERIZED   Final   Special Requests NONE   Final   Culture  Setup Time     Final   Value: 06/17/2014 01:57     Performed at [facility]     Final   Value: >=100,000 COLONIES/ML     Performed at [facility]   Culture     Final   Value: ESCHERICHIA COLI     Performed at [facility]   Report Status PENDING   Incomplete          Studies: Ct Abdomen Pelvis W Contrast  06/16/2014   CLINICAL DATA:  Abdominal pain for 2 weeks, history of colon cancer  EXAM: CT ABDOMEN AND PELVIS WITH CONTRAST  TECHNIQUE: Multidetector CT imaging of the abdomen and pelvis was performed using the standard protocol following bolus administration of intravenous contrast.  CONTRAST:  43mL OMNIPAQUE IOHEXOL 300 MG/ML SOLN, 144mL OMNIPAQUE IOHEXOL 300 MG/ML SOLN  COMPARISON:  01/31/2014  FINDINGS: Bibasilar atelectasis.  No acute musculoskeletal findings.  Sub cm low-attenuation lesion caudate lobe of the liver posterior to the right portal vein, stable. Liver otherwise normal. Gallbladder sludge or mildly calcified gallstones with several foci of hyperattenuation within the gallbladder lumen, stable. Splenic calcification stable. Pancreatic atrophy stable. Adrenal glands normal. Bilateral renal vascular calcification and mild cortical atrophy stable.  The patient is status post transverse colostomy. Colostomy site unremarkable. Distal colon decompressed. No abnormally dilated loops of bowel.  Bladder wall thickening diffusely, stable. No ascites or significant adenopathy. Extensive atherosclerotic calcification of the aorta with calcification and plaque. Infrarenal abdominal aorta dilated to 3.1 cm.  IMPRESSION: Stable  postoperative change with no acute abnormalities. Colostomy site unremarkable. Multiple nonacute findings stable.   Electronically Signed   By: Skipper Cliche M.D.   On: 06/16/2014 23:29   Dg Chest Port 1 View  06/17/2014   CLINICAL DATA:  Infiltrates  EXAM: PORTABLE CHEST - 1 VIEW  COMPARISON:  09/07/2013  FINDINGS: No cardiomegaly. There is chronic tortuosity of the aorta, with particularly prominent aortic arch. Note that multiple saccular aneurysms were noted from the aortic arch and descending aorta on chest CT 02/14/2012.  Bandlike opacity at the medial right base, consistent with atelectasis or scar. There is no edema, consolidation, effusion, or pneumothorax.  IMPRESSION: Scarring or mild atelectasis at the right base. No edema or pneumonia.   Electronically Signed   By: Jorje Guild M.D.   On: 06/17/2014 02:10        Scheduled Meds: . apixaban  5 mg Oral BID  . cefTRIAXone (ROCEPHIN)  IV  1 g Intravenous Q24H  . insulin aspart  0-9 Units Subcutaneous TID  WC  . lisinopril  5 mg Oral Daily  . metoprolol tartrate  12.5 mg Oral BID  . nortriptyline  150 mg Oral QHS  . traZODone  150 mg Oral QHS   Continuous Infusions:   Principal Problem:   Abdominal pain Active Problems:   Paroxysmal atrial fibrillation   Adenocarcinoma, colon   UTI (lower urinary tract infection)   Hypertension   Anemia    Time spent: 25 minutes    Garet Hooton, MD, FACP, FHM. [facility] Pager [phone]  If 7PM-7AM, please contact night-coverage [email or web address] Password [email or web address] 06/18/2014, 2:59 PM    LOS: 2 days

## 2014-06-18 NOTE — Progress Notes (Signed)
Patient requesting "something for her nerves". Paged NP. Order received for ativan 1 mg po x1.

## 2014-06-19 DIAGNOSIS — R221 Localized swelling, mass and lump, neck: Secondary | ICD-10-CM | POA: Diagnosis not present

## 2014-06-19 LAB — GLUCOSE, CAPILLARY
GLUCOSE-CAPILLARY: 123 mg/dL — AB (ref 70–99)
GLUCOSE-CAPILLARY: 147 mg/dL — AB (ref 70–99)
GLUCOSE-CAPILLARY: 145 mg/dL — AB (ref 70–99)
Glucose-Capillary: 125 mg/dL — ABNORMAL HIGH (ref 70–99)

## 2014-06-19 LAB — URINE CULTURE

## 2014-06-19 MED ORDER — CEFUROXIME AXETIL 250 MG PO TABS
250.0000 mg | ORAL_TABLET | Freq: Two times a day (BID) | ORAL | Status: DC
  Administered 2014-06-20 – 2014-06-21 (×3): 250 mg via ORAL
  Filled 2014-06-19 (×5): qty 1

## 2014-06-19 MED ORDER — DEXTROSE 5 % IV SOLN
1.0000 g | INTRAVENOUS | Status: AC
  Administered 2014-06-19: 1 g via INTRAVENOUS
  Filled 2014-06-19: qty 10

## 2014-06-19 MED ORDER — DOXYCYCLINE HYCLATE 100 MG PO TABS
100.0000 mg | ORAL_TABLET | Freq: Two times a day (BID) | ORAL | Status: DC
  Administered 2014-06-19 – 2014-06-21 (×5): 100 mg via ORAL
  Filled 2014-06-19 (×6): qty 1

## 2014-06-19 NOTE — Evaluation (Signed)
Physical Therapy Evaluation Patient Details Name: [name] MRN: [id number] DOB: 11-26-1929 Today's Date: 06/19/2014   History of Present Illness  [name] is a 80 y.o. female with history of colon cancer status post resection in last December 2014 with colostomy bag placement, history of hypertension, paroxysmal atrial fibrillation, diabetes mellitus was brought to the ER by patient's daughter patient was complaining of lower abdominal pain.   Clinical Impression  **Pt admitted with UTI, nausea/vomiting, abdominal pain**. Pt currently with functional limitations due to the deficits listed below (see PT Problem List).  Pt will benefit from skilled PT to increase their independence and safety with mobility to allow discharge to the venue listed below.   Total assist for supine to sit, pt sat on edge of bed for 2 minutes but reported dizziness and needed to lie back down. She is oriented to self and to location, but stated the year is, "57 or 9" and that she lives with her mother. Unclear what her baseline is with mobility. At present she will require 24* assist at SNF level.   *    Follow Up Recommendations SNF    Equipment Recommendations   (TBD)    Recommendations for Other Services OT consult     Precautions / Restrictions Precautions Precautions: Fall Restrictions Weight Bearing Restrictions: No      Mobility  Bed Mobility Overal bed mobility: Needs Assistance;+2 for physical assistance Bed Mobility: Supine to Sit     Supine to sit: +2 for physical assistance;Total assist     General bed mobility comments: pt 20%, required assist to raise trunk and advance BLEs; +2 to scoot to [facility]  Transfers                 General transfer comment: NT- pt dizzy in sitting and requested to lie down  Ambulation/Gait                Stairs            Wheelchair Mobility    Modified Rankin (Stroke Patients Only)       Balance Overall balance  assessment: Needs assistance Sitting-balance support: Bilateral upper extremity supported;Feet unsupported Sitting balance-Leahy Scale: Poor Sitting balance - Comments: min assist for static sitting balance, pt sat on EOB x 2 minutes but couldn't tolerate more due to reported dizziness                                     Pertinent Vitals/Pain      Home Living                   Additional Comments: pt not oriented (stated it's "1967 or 1977" and that she lives with her mother), no family present to provide PLOF    Prior Function                 Hand Dominance        Extremity/Trunk Assessment   Upper Extremity Assessment: RUE deficits/detail RUE Deficits / Details: fingers in flexion contracture, can elevate RUE 50*, pt stated she "had a stroke in that arm a few weeks ago"         Lower Extremity Assessment: LLE deficits/detail   LLE Deficits / Details: L foot plantar flexion contracture  Cervical / Trunk Assessment: Normal  Communication      Cognition Arousal/Alertness: Awake/alert Behavior During Therapy: WFL for tasks  assessed/performed Overall Cognitive Status: No family/caregiver present to determine baseline cognitive functioning (pt not oriented to year, stated its 1967 or 1977 and that she lives with her mother, per nursing she lives with her daughter, pt oriented to self and to location)       Memory: Decreased short-term memory              General Comments      Exercises        Assessment/Plan    PT Assessment Patient needs continued PT services  PT Diagnosis Generalized weakness;Altered mental status   PT Problem List Decreased balance;Decreased activity tolerance;Decreased strength;Decreased mobility;Decreased cognition  PT Treatment Interventions Functional mobility training;Therapeutic activities;Patient/family education;Balance training;Therapeutic exercise   PT Goals (Current goals can be found in the Care  Plan section) Acute Rehab PT Goals Patient Stated Goal: to get out of bed PT Goal Formulation: With patient Time For Goal Achievement: 07/03/14 Potential to Achieve Goals: Fair    Frequency Min 3X/week   Barriers to discharge        Co-evaluation               End of Session   Activity Tolerance: Other (comment) (dizziness) Patient left: in bed;with call bell/phone within reach;with bed alarm set Nurse Communication: Mobility status;Other (comment) (confusion)         Time: 2376-2831 PT Time Calculation (min): 19 min   Charges:   PT Evaluation $Initial PT Evaluation Tier I: 1 Procedure PT Treatments $Therapeutic Activity: 8-22 mins   PT G Codes:          Blondell Reveal Kistler 06/19/2014, 10:20 AM [phone]

## 2014-06-19 NOTE — Care Management Note (Signed)
CARE MANAGEMENT NOTE 06/19/2014  Patient:  [name], [name]   Account Number:  [id number]  Date Initiated:  06/18/2014  Documentation initiated by:  Leafy Kindle  Subjective/Objective Assessment:   80 yo female admitted with abd pain     Action/Plan:   From home with daughter   Anticipated DC Date:  06/21/2014   Anticipated DC Plan:  [place]  CM consult      [facility]   Choice offered to / List presented to:  C-4 Adult Children   DME arranged  OTHER - SEE COMMENT      DME agency  [place] arranged  HH-1 RN  [place].   Status of service:  In process, will continue to follow Medicare Important Message given?  YES (If response is "NO", the following Medicare IM given date fields will be blank) Date Medicare IM given:  06/19/2014 Medicare IM given by:  Leafy Kindle Date Additional Medicare IM given:   Additional Medicare IM given by:    Discharge Disposition:    Per UR Regulation:  Reviewed for med. necessity/level of care/duration of stay  If discussed at [place] of Stay Meetings, dates discussed:    Comments:  06/19/14 Marney Doctor RN,BSN,NCM Met with Daughter and pt with CSW this am to speak about DC plans.  Pt and daughter were adamant that pt was going home with daughter and not to a SNF if that was recommended.  Pt has used [facility] in the past and would like to use them again. Pt has wheelchair, bsc and tub chair at home already. Daughter is requesting [facility] lift to assist her moving her mother around.  [place] DME rep called to inform.  Also needed: HHRN/PT/OT/AIDE/SW.  Awaiting orders from MD.  [facility] rep informed of referral.  Daughter [name] is contact for patient (mobile) [phone].  CM will continue to follw.  06/18/14 Marney Doctor RN,BSN,NCM Pt was discharged from [facility] on 9/19 due to prognosis of  longer than 6 months per Santiago Glad from [facility].  CM will continue to follow for DC needs.

## 2014-06-19 NOTE — Progress Notes (Signed)
Clinical Social Work Department BRIEF PSYCHOSOCIAL ASSESSMENT 06/19/2014  Patient:  [name], [name]     Account Number:  [id number]     [place] date:  06/16/2014  Clinical Social Worker:  Ulyess Blossom  Date/Time:  06/19/2014 02:53 PM  Referred by:  Physician  Date Referred:  06/19/2014 Referred for  SNF Placement   Other Referral:   Interview type:  Patient Other interview type:   and patient daughter at bedside    PSYCHOSOCIAL DATA Living Status:  FAMILY Admitted from facility:   Level of care:   Primary support name:  [name] Primary support relationship to patient:  CHILD, ADULT Degree of support available:   adequate    CURRENT CONCERNS Current Concerns  Post-Acute Placement   Other Concerns:    SOCIAL WORK ASSESSMENT / PLAN CSW received referral for concerns about pt discharge planning as pt home care provider expressed concern about pt daughter displaying drug seeking behavior and how this may impact pt care at home.    CSW and RNCM met with pt and pt daughter at bedside this morning. Pt shared that pt lives with her and pt daughter has been caring for pt for many years. Support provided as pt daughter discussed that pt had a stroke a few years ago and has progressively lost her ability to use her right side. Pt daughter stated that pt was enrolled with [facility], but recently discharge from services on 9/19 due to prognosis of longer than 6 months. CSW discussed with pt daughter that PT planned to evaluate pt and likely will recommend SNF placement. Pt and pt daughter adamantly decline SNF and wish for pt to return home. Pt daughter expressed no hesitancy regarding recommendation for [facility] RN/OT/PT/Aide/SW and very appreciative for additional services in the home.    Pt daughter left pt room this morning as she had errands to address today. CSW spoke to pt privately at this time. Pt alert and oriented x 4 during time of conversation. Pt shared that she  feels safe at home. Pt stated that pt daughter takes "real good care of her" and she expressed no concern about pt daughter ability to continue to care for her.    CSW reviewed chart later in the day and noted that PT recommended SNF. CSW also spoke with RN who reports that pt has become confused and yelling out. CSW re-contacted pt daughter via telephone. CSW discussed with pt daughter PT evaluation and pt requring total assist. CSW discussed with pt daughter that pt has become very confused this afternoon and yelling out. Pt daughter shared that pt was total assist prior to admission and that pt had periods prior to admission that she would get really confused and disoriented. CSW discussed with pt daughter the importance of ensuring that pt had someone with her at all times. Pt daughter stated that pt daughter only has to leave 3 times a week for an hour and a half and during that time pt neighbor stays with pt. Pt daughter continues to feel that pt needs can be met at home and declines option for SNF. Pt daughter stated that she feels that a hoyer lift would be beneficial at home to assist with pt transfers. Pt daughter agreeable to [facility] RN/PT/OT/Aide/SW and continued to express great appreciation for assistance. CSW notified RNCM.    CSW has contacted APS 3 times and left 3 messages. Awaiting call back to determine if APS has open case on pt. CSW will continue to assess if  APS report warranted if APS case is not already active.    CSW to continue to follow to provide support and assist with pt discharge planning needs.   Assessment/plan status:  Psychosocial Support/Ongoing Assessment of Needs Other assessment/ plan:   discharge planning   Information/referral to community resources:   Referral to [facility] for [facility] needs    PATIENT'S/FAMILY'S RESPONSE TO PLAN OF CARE: Pt alert and orientation waxing and waning. This morning pt alert and appropriately oriented and answering questions appropriately. Pt  shared this morning that pt daughter takes good care of her, but pt became very confused and yelling out this afternoon. Pt daughter adamant about pt returning home with 24 hour care and [facility] services.  CSW asked RNCM to include [facility] SW in order for home environment to continue to be assessed upon pt return home.    [name], MSW, [place] Work [phone]

## 2014-06-19 NOTE — Progress Notes (Signed)
PROGRESS NOTE    [name] RUE:[id number] DOB: 11/04/1929 DOA: 06/16/2014 PCP: Leonard Downing, MD  HPI/Brief narrative 80 y.o. female with history of colon cancer status post resection in last December 2014 with colostomy bag placement, history of hypertension, paroxysmal atrial fibrillation, diabetes mellitus presented to the ED with complaints of abdominal pain of 2 weeks' duration, on and off nausea and vomiting. She was seen in ED 2 weeks ago and completed a course of antibiotics without significant improvement. In the ED CT abdomen and pelvis unremarkable. UA suggests UTI. Patient apparently has not been taking medications for her other medical conditions for a few months.   Assessment/Plan:  1. Escherichia coli UTI: Continue IV Rocephin pending urine culture results. CT abdomen without acute findings. Abdominal pain possibly secondary to this and has currently resolved. 2. History of colon cancer status post resection and colostomy bag: OP follow up with Dr. Benay Spice. 3. History of PAF: Currently in sinus rhythm. Noncompliant with metoprolol and anticoagulation. Admitting M.D. discussed with patient's daughter and resumed metoprolol and Apixaban. 4. Hypertension: Uncontrolled on admission-better now. 5. Anemia: Stable. 6. History of DM 2: Reasonable inpatient control. Continue SSI. 7. History of CVA with right hemiparesis: No residual deficits. 8. History of dementia: Memory deficits. Periodically confused. 9. ? Lymphadenopathy versus parotitis: Tender swelling just below the angle of right jaw. Continue antibiotics and symptomatic treatment. Followup.   Code Status: DO NOT RESUSCITATE Family Communication: Discussed with daughter Ms. [name]. Disposition Plan: PT recommends SNF.   Consultants:  None  Procedures:  None  Antibiotics:  IV Rocephin   Subjective: Painful swelling near the angle of right jaw. As per daughter, patient has had small swelling  in that region for a couple of months but has recently gotten bigger and painful. Patient denies painful mouth sores or lesions.  Objective: Filed Vitals:   06/18/14 1245 06/18/14 2030 06/18/14 2147 06/19/14 0440  BP: 116/33  124/41 136/36  Pulse: 64 60 68 103  Temp: 98.3 F (36.8 C)  97.4 F (36.3 C) 97.7 F (36.5 C)  TempSrc: Oral  Oral Oral  Resp: 18  16 16   Height:      Weight:      SpO2: 99%  99% 98%    Intake/Output Summary (Last 24 hours) at 06/19/14 1254 Last data filed at 06/19/14 0550  Gross per 24 hour  Intake    688 ml  Output      0 ml  Net    688 ml   Filed Weights   06/17/14 0216 06/18/14 0441  Weight: 67.223 kg (148 lb 3.2 oz) 68.947 kg (152 lb)     Exam:  General exam: Pleasant elderly female lying comfortably in bed. Respiratory system: Clear. No increased work of breathing. Cardiovascular system: S1 & S2 heard, RRR. No JVD, murmurs, gallops, clicks or pedal edema. Gastrointestinal system: Abdomen is nondistended, soft and nontender. Colostomy intact with small soft stool. Normal bowel sounds heard. Central nervous system: Alert and oriented x2. No focal neurological deficits. Extremities: Symmetric 5 x 5 power. Neck: Approximately 2 cm diameter tender, firm, mobile and nonfluctuant swelling just below the angle of the right jaw. No erythema.   Data Reviewed: Basic Metabolic Panel:  Recent Labs Lab 06/16/14 2100 06/17/14 0513  NA 142 138  K 3.8 3.5*  CL 102 102  CO2 25 22  GLUCOSE 159* 130*  BUN 33* 27*  CREATININE 0.86 0.72  CALCIUM 9.0 8.5   Liver Function Tests:  Recent Labs Lab 06/16/14 2100 06/17/14 0513  AST 15 13  ALT 6 5  ALKPHOS 76 64  BILITOT <0.2* 0.3  PROT 7.7 7.5  ALBUMIN 2.9* 2.8*   No results found for this basename: LIPASE, AMYLASE,  in the last 168 hours No results found for this basename: AMMONIA,  in the last 168 hours CBC:  Recent Labs Lab 06/16/14 2100 06/17/14 0633  WBC 10.2 10.9*  NEUTROABS 5.8  6.5  HGB 11.3* 11.0*  HCT 36.4 33.8*  MCV 84.5 83.0  PLT 417* 400   Cardiac Enzymes: No results found for this basename: CKTOTAL, CKMB, CKMBINDEX, TROPONINI,  in the last 168 hours BNP (last 3 results)  Recent Labs  09/03/13 0638  PROBNP 1298.0*   CBG:  Recent Labs Lab 06/18/14 1129 06/18/14 1729 06/18/14 2132 06/19/14 0804 06/19/14 1153  GLUCAP 128* 204* 128* 123* 147*    Recent Results (from the past 240 hour(s))  URINE CULTURE     Status: None   Collection Time    06/16/14  8:27 PM      Result Value Ref Range Status   Specimen Description URINE, CATHETERIZED   Final   Special Requests NONE   Final   Culture  Setup Time     Final   Value: 06/17/2014 01:57     Performed at [facility]     Final   Value: >=100,000 COLONIES/ML     Performed at [facility]   Culture     Final   Value: ESCHERICHIA COLI     Performed at [facility]   Report Status PENDING   Incomplete          Studies: No results found.      Scheduled Meds: . apixaban  5 mg Oral BID  . cefTRIAXone (ROCEPHIN)  IV  1 g Intravenous Q24H  . insulin aspart  0-9 Units Subcutaneous TID WC  . lisinopril  5 mg Oral Daily  . metoprolol tartrate  12.5 mg Oral BID  . nortriptyline  150 mg Oral QHS  . traZODone  150 mg Oral QHS   Continuous Infusions:   Principal Problem:   Abdominal pain Active Problems:   Paroxysmal atrial fibrillation   Adenocarcinoma, colon   UTI (lower urinary tract infection)   Hypertension   Anemia    Time spent: 25 minutes    Dorien Bessent, MD, FACP, FHM. [facility] Pager [phone]  If 7PM-7AM, please contact night-coverage [email or web address] Password [email or web address] 06/19/2014, 12:54 PM    LOS: 3 days

## 2014-06-19 NOTE — Plan of Care (Signed)
Problem: Consults Goal: Skin Care Protocol Initiated - if Braden Score 18 or less If consults are not indicated, leave blank or document N/A  Outcome: Completed/Met Date Met:  06/19/14 Patient with wound to sacrum covered with allevyn dsg

## 2014-06-20 ENCOUNTER — Encounter (HOSPITAL_COMMUNITY): Payer: Self-pay | Admitting: Radiology

## 2014-06-20 ENCOUNTER — Inpatient Hospital Stay (HOSPITAL_COMMUNITY): Payer: Medicare Other

## 2014-06-20 DIAGNOSIS — I4891 Unspecified atrial fibrillation: Secondary | ICD-10-CM

## 2014-06-20 DIAGNOSIS — C189 Malignant neoplasm of colon, unspecified: Secondary | ICD-10-CM

## 2014-06-20 DIAGNOSIS — R221 Localized swelling, mass and lump, neck: Secondary | ICD-10-CM

## 2014-06-20 DIAGNOSIS — N390 Urinary tract infection, site not specified: Principal | ICD-10-CM

## 2014-06-20 DIAGNOSIS — R220 Localized swelling, mass and lump, head: Secondary | ICD-10-CM

## 2014-06-20 LAB — GLUCOSE, CAPILLARY
Glucose-Capillary: 125 mg/dL — ABNORMAL HIGH (ref 70–99)
Glucose-Capillary: 173 mg/dL — ABNORMAL HIGH (ref 70–99)
Glucose-Capillary: 148 mg/dL — ABNORMAL HIGH (ref 70–99)
Glucose-Capillary: 133 mg/dL — ABNORMAL HIGH (ref 70–99)

## 2014-06-20 MED ORDER — IOHEXOL 300 MG/ML  SOLN
100.0000 mL | Freq: Once | INTRAMUSCULAR | Status: AC | PRN
  Administered 2014-06-20: 100 mL via INTRAVENOUS

## 2014-06-20 MED ORDER — INFLUENZA VAC SPLIT QUAD 0.5 ML IM SUSY
0.5000 mL | PREFILLED_SYRINGE | INTRAMUSCULAR | Status: AC
  Administered 2014-06-21: 0.5 mL via INTRAMUSCULAR
  Filled 2014-06-20 (×3): qty 0.5

## 2014-06-20 NOTE — Progress Notes (Addendum)
PROGRESS NOTE    [name] WFU:[id number] DOB: [birth year] DOA: 06/16/2014 PCP: Leonard Downing, MD  HPI/Brief narrative 80 y.o. female with history of colon cancer status post resection in last December 2014 with colostomy bag placement, history of hypertension, paroxysmal atrial fibrillation, diabetes mellitus presented to the ED with complaints of abdominal pain of 2 weeks' duration, on and off nausea and vomiting. She was seen in ED 2 weeks ago and completed a course of antibiotics without significant improvement. In the ED CT abdomen and pelvis unremarkable. UA suggests UTI. Patient apparently has not been taking medications for her other medical conditions for a few months.   Assessment/Plan:  1. Escherichia coli UTI: Treated empirically with IV Rocephin. CT abdomen without acute findings. Abdominal pain possibly secondary to this and has currently resolved. Switched to PO Ceftin to complete total one-week treatment. 2. Stage II (T3 N0) adenocarcinoma of the distal transverse colon/splenic flexure, status post a partial colectomy and end colostomy 09/06/2013: OP follow up with Dr. Benay Spice. Patient declined adjuvant chemotherapy. Supposed to have seen Dr. Benay Spice 6 months from January for consideration for colostomy takedown. Patient apparently was on fentanyl patch placed by home health services-discussed. There has been some concern that daughter has been taking patient's narcotic pills. 3. History of PAF: Currently in sinus rhythm. Noncompliant with metoprolol and anticoagulation. Admitting M.D. discussed with patient's daughter and resumed metoprolol and Apixaban. 4. Hypertension: Uncontrolled on admission-better now. 5. Anemia: Stable. 6. History of DM 2: Reasonable inpatient control. Continue SSI. 7. History of CVA with right hemiparesis: No residual deficits. 8. History of dementia: Memory deficits. Periodically confused. 9. Painful right side neck swelling:?  Lymphadenopathy versus parotitis: Tender swelling just below the angle of right jaw. Started on oral doxycycline 9/22. Painful swelling has worsened today-discussed with radiology and will get a CT soft tissue neck with contrast to further evaluate. Nonpulsatile.   Code Status: DO NOT RESUSCITATE Family Communication: Discussed with daughter Ms. [name] 9/23. Disposition Plan: PT recommends SNF. Not stable for discharge.   Consultants:  None  Procedures:  None  Antibiotics:  IV Rocephin   Subjective: States that she had continuous pain over right neck swelling overnight.  Objective: Filed Vitals:   06/19/14 0440 06/19/14 1500 06/19/14 2113 06/20/14 0500  BP: 136/36 119/41 147/52 152/48  Pulse: 103 65 67 63  Temp: 97.7 F (36.5 C) 97.8 F (36.6 C) 98.3 F (36.8 C) 97.5 F (36.4 C)  TempSrc: Oral Oral Oral Oral  Resp: 16 18 20 16   Height:      Weight:    71.26 kg (157 lb 1.6 oz)  SpO2: 98% 98% 98% 95%    Intake/Output Summary (Last 24 hours) at 06/20/14 1029 Last data filed at 06/19/14 1508  Gross per 24 hour  Intake    240 ml  Output      0 ml  Net    240 ml   Filed Weights   06/17/14 0216 06/18/14 0441 06/20/14 0500  Weight: 67.223 kg (148 lb 3.2 oz) 68.947 kg (152 lb) 71.26 kg (157 lb 1.6 oz)     Exam:  General exam: Pleasant elderly female lying comfortably in bed. Respiratory system: Clear. No increased work of breathing. Cardiovascular system: S1 & S2 heard, RRR. No JVD, murmurs, gallops, clicks or pedal edema. Gastrointestinal system: Abdomen is nondistended, soft and nontender. Colostomy intact with small soft stool. Normal bowel sounds heard. Central nervous system: Alert and oriented x2. No focal neurological deficits. Extremities: Symmetric  5 x 5 power. Neck: Approximately 2 cm diameter tender, firm, mobile, nonpulsatile and nonfluctuant swelling just below the angle of the right jaw. No erythema. Vertically oval. Has definitely gotten bigger  than yesterday.   Data Reviewed: Basic Metabolic Panel:  Recent Labs Lab 06/16/14 2100 06/17/14 0513  NA 142 138  K 3.8 3.5*  CL 102 102  CO2 25 22  GLUCOSE 159* 130*  BUN 33* 27*  CREATININE 0.86 0.72  CALCIUM 9.0 8.5   Liver Function Tests:  Recent Labs Lab 06/16/14 2100 06/17/14 0513  AST 15 13  ALT 6 5  ALKPHOS 76 64  BILITOT <0.2* 0.3  PROT 7.7 7.5  ALBUMIN 2.9* 2.8*   No results found for this basename: LIPASE, AMYLASE,  in the last 168 hours No results found for this basename: AMMONIA,  in the last 168 hours CBC:  Recent Labs Lab 06/16/14 2100 06/17/14 0633  WBC 10.2 10.9*  NEUTROABS 5.8 6.5  HGB 11.3* 11.0*  HCT 36.4 33.8*  MCV 84.5 83.0  PLT 417* 400   Cardiac Enzymes: No results found for this basename: CKTOTAL, CKMB, CKMBINDEX, TROPONINI,  in the last 168 hours BNP (last 3 results)  Recent Labs  09/03/13 0638  PROBNP 1298.0*   CBG:  Recent Labs Lab 06/19/14 0804 06/19/14 1153 06/19/14 1716 06/19/14 2116 06/20/14 0803  GLUCAP 123* 147* 145* 125* 125*    Recent Results (from the past 240 hour(s))  URINE CULTURE     Status: None   Collection Time    06/16/14  8:27 PM      Result Value Ref Range Status   Specimen Description URINE, CATHETERIZED   Final   Special Requests NONE   Final   Culture  Setup Time     Final   Value: 06/17/2014 01:57     Performed at [place]     Final   Value: >=100,000 COLONIES/ML     Performed at [facility]   Culture     Final   Value: ESCHERICHIA COLI     Performed at [facility]   Report Status 06/19/2014 FINAL   Final   Organism ID, Bacteria ESCHERICHIA COLI   Final          Studies: No results found.      Scheduled Meds: . apixaban  5 mg Oral BID  . cefUROXime  250 mg Oral BID WC  . doxycycline  100 mg Oral Q12H  . insulin aspart  0-9 Units Subcutaneous TID WC  . lisinopril  5 mg Oral Daily  . metoprolol tartrate  12.5 mg Oral  BID  . nortriptyline  150 mg Oral QHS  . traZODone  150 mg Oral QHS   Continuous Infusions:   Principal Problem:   Abdominal pain Active Problems:   Paroxysmal atrial fibrillation   Adenocarcinoma, colon   UTI (lower urinary tract infection)   Hypertension   Anemia   Localized swelling, mass and lump, neck    Time spent: 25 minutes    Ceira Hoeschen, MD, FACP, FHM. [facility] Pager [phone]  If 7PM-7AM, please contact night-coverage [email or web address] Password [email or web address] 06/20/2014, 10:29 AM    LOS: 4 days

## 2014-06-20 NOTE — Progress Notes (Signed)
Moist heat applied at 1515 with cloth & K-Pad to R neck parotid area. Hard candy at bedside. 1 piece given to suck on at 1545. Parotid area massaged and moist heat reapplied.Harlene Ramus

## 2014-06-20 NOTE — Progress Notes (Signed)
Addendum  CT soft tissue neck with contrast reviewed-suggests focus of sialoadenitis in inferior aspect of right parotid gland. Treat with PO hydration, continue oral doxycycline, apply moist heat topically, massage the gland and suck on hard candies.  Vernell Leep, MD, FACP, FHM. [facility] Pager [phone]  If 7PM-7AM, please contact night-coverage [email or web address] Password [email or web address] 06/20/2014, 3:00 PM

## 2014-06-20 NOTE — Progress Notes (Signed)
Told MD that Fentanyl patch 25 mcg found on patient this am while bathing. Removed Patch.

## 2014-06-20 NOTE — Progress Notes (Addendum)
CSW continuing to follow.   CSW received return phone call from [place], Devoria Albe (ph #: [phone]). Per APS caseworker, APS confirmed that pt had a previous APS referral that was at the end of July that has been investigated and closed. APS caseworker inquired with CSW about concerns in the hospital. CSW discussed with APS Caseworker regarding the concerns reported to CSW from RN and reported to RN from [facility] NP, Almyra Free. APS case worker discussed case with her supervisor and returned phone call to CSW requesting for CSW to contact APS case worker, Devoria Albe at [phone]) directly upon pt discharge in order for pt to be re-screened by APS for concerns. PT evaluation recommended SNF placement, but Pt daughter declining SNF placement.Marland Kitchen Pt plans to return home with [facility] services including [place] PT/OT/RN/Aide/SW.   Pt will require ambulance transport home upon discharge.  CSW will notify APS caseworker, Devoria Albe of pt discharge once pt discharges from the hospital to home.   CSW to continue to follow.  Alison Murray, MSW, [place] Work [phone]

## 2014-06-21 DIAGNOSIS — K1120 Sialoadenitis, unspecified: Secondary | ICD-10-CM

## 2014-06-21 DIAGNOSIS — I10 Essential (primary) hypertension: Secondary | ICD-10-CM

## 2014-06-21 DIAGNOSIS — N390 Urinary tract infection, site not specified: Secondary | ICD-10-CM | POA: Diagnosis not present

## 2014-06-21 DIAGNOSIS — D649 Anemia, unspecified: Secondary | ICD-10-CM

## 2014-06-21 LAB — CBC
HEMATOCRIT: 31 % — AB (ref 36.0–46.0)
Hemoglobin: 9.8 g/dL — ABNORMAL LOW (ref 12.0–15.0)
MCH: 27.1 pg (ref 26.0–34.0)
MCHC: 31.6 g/dL (ref 30.0–36.0)
MCV: 85.6 fL (ref 78.0–100.0)
Platelets: 311 10*3/uL (ref 150–400)
RBC: 3.62 MIL/uL — ABNORMAL LOW (ref 3.87–5.11)
RDW: 18.8 % — AB (ref 11.5–15.5)
WBC: 9.2 10*3/uL (ref 4.0–10.5)

## 2014-06-21 LAB — GLUCOSE, CAPILLARY
GLUCOSE-CAPILLARY: 155 mg/dL — AB (ref 70–99)
Glucose-Capillary: 119 mg/dL — ABNORMAL HIGH (ref 70–99)

## 2014-06-21 MED ORDER — ACETAMINOPHEN 325 MG PO TABS: 650.0000 mg | ORAL_TABLET | Freq: Four times a day (QID) | ORAL | Status: DC | PRN

## 2014-06-21 MED ORDER — CEFUROXIME AXETIL 250 MG PO TABS: 250.0000 mg | ORAL_TABLET | Freq: Two times a day (BID) | ORAL | Status: DC

## 2014-06-21 MED ORDER — METOPROLOL TARTRATE 12.5 MG HALF TABLET: 12.5000 mg | ORAL_TABLET | Freq: Two times a day (BID) | ORAL | Status: DC

## 2014-06-21 MED ORDER — APIXABAN 5 MG PO TABS: 5.0000 mg | ORAL_TABLET | Freq: Two times a day (BID) | ORAL | Status: DC

## 2014-06-21 MED ORDER — LISINOPRIL 5 MG PO TABS: 5.0000 mg | ORAL_TABLET | Freq: Every day | ORAL | Status: DC

## 2014-06-21 MED ORDER — DOXYCYCLINE HYCLATE 100 MG PO TABS: 100.0000 mg | ORAL_TABLET | Freq: Two times a day (BID) | ORAL | Status: DC

## 2014-06-21 NOTE — Progress Notes (Signed)
I spoke with [name],patients daughter re: the discharge instructions,instructed her on how to take take care of mother's R neck swelling,to appy moist heat and massage swelling a few times daily and to suck on hard candy,daughter verbalized understanding, teach back utilized. Patient in good spirits. Stable for d/c, denies pain. Colostomy changed, stoma unremarkable.Sandie Ano RN

## 2014-06-21 NOTE — Progress Notes (Signed)
CSW consulted for transportation needs. Patient will need non-emergency ambulance transport home. CSW confirmed home address with patient's daughter, [name] (ph#: [phone]). CSW left a message for Devoria Albe @ APS to make aware of patient's discharge today.   [facility] called for transport. No other CSW needs identified - CSW signing off.   Raynaldo Opitz, [facility] Clinical Social Worker cell #: [phone]

## 2014-06-21 NOTE — Progress Notes (Signed)
DR. Algis Liming called this nurse to call back [name] , patient's daughter to notify her of scheduled appoinment. I was able to  talk to [facility] and she wrote down notes to schedule appointments with Alvester Chou NP in 5 days for her mother's labs,CBC and BMP and also told her to call Dr. Carin Hock office to also schedule an appoinment.-Lindyn Vossler [place] RN

## 2014-06-21 NOTE — Progress Notes (Signed)
ANTICOAGULATION CONSULT NOTE - Follow up  Pharmacy Consult for Apixaban Indication: atrial fibrillation  Allergies  Allergen Reactions  . Aggrenox [Aspirin-Dipyridamole Er] Diarrhea and Nausea And Vomiting    & abdominal bleeding    Patient Measurements: Height: 5\' 2"  (157.5 cm) Weight: 156 lb 11.2 oz (71.079 kg) IBW/kg (Calculated) : 50.1  Assessment: 7 yoF admitted 9/21 with lower abdominal pain x 2weeks PTA with some associated N/V. Noted patient with a Hx paroxysmal atrial fibrillation not on anticoagulation for stroke prevention PTA.   CHADS2 score = 6 (CHF, hypertension, age>75, diabetes, Hx stroke in 2008).   D/c instructions 09/14/13 note cardiology recommended patient start on apixaban, however I can not tell if this was ever started. Of note, patient was on Plavix at that time which the patient seems to have stopped taking sometime this summer. Patient and daughter also stated that patient had stopped taking medications for her atrial fibrillation a few months ago.   Patient is agreeable to restarting on anticoagulation and Pharmacy has been consulted to dose apixaban for atrial fibrillation.   Age 80 (>/= 80)  SCr 0.8 (not >/= 1.5)  Weight 71.1kg (not </= 60kg)  Patient appropriate for 5mg  PO BID dosing, all does have been charted  Hgb down to 9.8 today (was 11.0 on admit), platelets ok at 311k  Noted patient in NSR  Goal of Therapy:  Apixaban dosed based on patient weight, age and renal function   Plan:  - continue apixaban 5mg  PO q12hr - CBC and bmet in AM - monitor for bleeding - pharmacy will follow-up daily  Thank you for the consult.  Currie Paris, PharmD, BCPS Pager: [phone] Pharmacy: [phone] 06/21/2014 10:24 AM

## 2014-06-21 NOTE — Care Management Note (Signed)
CARE MANAGEMENT NOTE 06/21/2014  Patient:  [name], [name]   Account Number:  [id number]  Date Initiated:  06/18/2014  Documentation initiated by:  Marney Doctor  Subjective/Objective Assessment:   80 yo female admitted with abd pain     Action/Plan:   From home with daughter   Anticipated DC Date:  06/21/2014   Anticipated DC Plan:  [place]  CM consult      [facility] Choice  HOME HEALTH   Choice offered to / List presented to:  C-4 Adult Children   DME arranged  OTHER - SEE COMMENT      DME agency  [facility] arranged  HH-1 RN  [place]      [place].   Status of service:  Completed, signed off Medicare Important Message given?  YES (If response is "NO", the following Medicare IM given date fields will be blank) Date Medicare IM given:  06/19/2014 Medicare IM given by:  Marney Doctor Date Additional Medicare IM given:  06/21/2014 Additional Medicare IM given by:  [facility] Switala  Discharge Disposition:    Per UR Regulation:  Reviewed for med. necessity/level of care/duration of stay  If discussed at [place] of Stay Meetings, dates discussed:    Comments:  06/21/14 Marney Doctor RN, BSN, NCM Orders written for [facility].  [facility] updated with MD orders.  06/19/14 Marney Doctor RN,BSN,NCM Met with Daughter and pt with CSW this am to speak about DC plans.  Pt and daughter were adamant that pt was going home with daughter and not to a SNF if that was recommended.  Pt has used [facility] in the past and would like to use them again. Pt has wheelchair, bsc and tub chair at home already. Daughter is requesting Harrel Lemon lift to assist her moving her mother around.  [place] DME rep called to inform.  Also needed: HHRN/PT/OT/AIDE/SW.  Awaiting orders from MD.  [facility] rep informed of referral.  Daughter [name] is contact for patient (mobile) [phone].  CM will continue  to follw.  06/18/14 Marney Doctor RN,BSN,NCM Pt was discharged from [facility] on 9/19 due to prognosis of longer than 6 months per Santiago Glad from [facility].  CM will continue to follow for DC needs.

## 2014-06-21 NOTE — Progress Notes (Signed)
Patient d/c home via ambulance. Patient is stable.Sandie Ano RN

## 2014-06-21 NOTE — Discharge Summary (Signed)
Physician Discharge Summary  [name] YKZ:[id number] DOB: 03-20-1930 DOA: 06/16/2014  PCP: Alvester Chou, NP  Admit date: 06/16/2014 Discharge date: 06/21/2014  Time spent: Less than 30 minutes  Recommendations for Outpatient Follow-up:  1. Alvester Chou, NP /PCP in 5 days with repeat labs (CBC & BMP). I had forgotten to include followup appointment prior to patient's discharge. Attempted to call patient's daughter without success. Left message for PCP to arrange followup. 2. Home health PT, OT, RN, Aide and Education officer, museum.  Discharge Diagnoses:  Principal Problem:   Abdominal pain Active Problems:   Paroxysmal atrial fibrillation   Adenocarcinoma, colon   UTI (lower urinary tract infection)   Hypertension   Anemia   Localized swelling, mass and lump, neck   Discharge Condition: Improved & Stable  Diet recommendation: Heart healthy and diabetic diet.  Filed Weights   06/18/14 0441 06/20/14 0500 06/21/14 0645  Weight: 68.947 kg (152 lb) 71.26 kg (157 lb 1.6 oz) 71.079 kg (156 lb 11.2 oz)    History of present illness:  80 y.o. female with history of colon cancer status post resection in last December 2014 with colostomy bag placement, history of hypertension, paroxysmal atrial fibrillation, diabetes mellitus presented to the ED with complaints of abdominal pain of 2 weeks' duration, on and off nausea and vomiting. She was seen in ED 2 weeks ago and completed a course of antibiotics without significant improvement. In the ED CT abdomen and pelvis unremarkable. UA suggests UTI. Patient apparently has not been taking medications for her other medical conditions for a few months.  Hospital Course:   1. Escherichia coli UTI: Treated empirically with IV Rocephin. CT abdomen without acute findings. Abdominal pain possibly secondary to this and has currently resolved. Switched to PO Ceftin to complete total one-week treatment. 2. Stage II (T3 N0) adenocarcinoma of the distal transverse  colon/splenic flexure, status post a partial colectomy and end colostomy 09/06/2013: OP follow up with Dr. Benay Spice. Patient declined adjuvant chemotherapy. Supposed to have seen Dr. Benay Spice 6 months from January for consideration for colostomy takedown.  3. History of PAF: Currently in sinus rhythm. Noncompliant with metoprolol and anticoagulation. Admitting M.D. discussed with patient's daughter and resumed metoprolol and Apixaban- preauthorization done prior to discharge. 4. Hypertension: Uncontrolled on admission-better now. Continue metoprolol and lisinopril. 5. Anemia: Slight drop in hemoglobin from 11-9.8. No reported bleeding. Outpatient followup with repeat CBCs. 6. History of DM 2: Reasonable inpatient control.  7. History of CVA with right hemiparesis: No residual deficits. 8. History of dementia: Memory deficits. Periodically confused. 9. Painful right side neck swelling/focal sialoadenitis of right parotid gland: Tender swelling just below the angle of right jaw. Started on oral doxycycline 9/22. Confirmed by CT. Treated supportively by moist warm compresses, local massage, chewing on hard candy and oral hydration. Improving. 10. Pain management: Discussed extensively with patient's outpatient provider Ms. Alvester Chou, NP, updated care and confirmed home medications PTA. Patient had been transitioned to transdermal pain management because her daughter took patient's pain pills. Resumed fentanyl patch at discharge-daughter has a refill prescription. SNF was recommended by PT but daughter declined and wished to take patient home. Home health service is maximized.  Consultations:  None  Procedures:  None    Discharge Exam:  Complaints:  Improvement in pain and swelling on right side of neck. Denies abdominal pain.  Filed Vitals:   06/20/14 2156 06/21/14 0645 06/21/14 1010 06/21/14 1307  BP:  146/72 124/35 111/83  Pulse:  73 61 75  Temp: 98.6 F (37 C) 98.1 F (36.7 C)  98.7 F  (37.1 C)  TempSrc: Oral Oral  Oral  Resp: 16 20  16   Height:      Weight:  71.079 kg (156 lb 11.2 oz)    SpO2: 98% 96%  96%   General exam: Pleasant elderly female lying comfortably in bed.  Respiratory system: Clear. No increased work of breathing.  Cardiovascular system: S1 & S2 heard, RRR. No JVD, murmurs, gallops, clicks or pedal edema.  Gastrointestinal system: Abdomen is nondistended, soft and nontender. Colostomy intact with small soft stool. Normal bowel sounds heard.  Central nervous system: Alert and oriented x2. No focal neurological deficits.  Extremities: Symmetric 5 x 5 power.  Neck: Approximately 2 cm diameter tender, firm, mobile, nonpulsatile and nonfluctuant swelling just below the angle of the right jaw. No erythema. Vertically oval. Slightly smaller, softer and less tender today.   Discharge Instructions      Discharge Instructions   Call MD for:  severe uncontrolled pain    Complete by:  As directed      Diet - low sodium heart healthy    Complete by:  As directed      Diet Carb Modified    Complete by:  As directed      Discharge instructions    Complete by:  As directed   Apply moist heat (as per directions provided) to right neck swelling, massage swelling a few times daily & suck on hard candy.     Increase activity slowly    Complete by:  As directed             Medication List    STOP taking these medications       aspirin 325 MG EC tablet      TAKE these medications       acetaminophen 325 MG tablet  Commonly known as:  TYLENOL  Take 2 tablets (650 mg total) by mouth every 6 (six) hours as needed for mild pain, moderate pain or fever (or Fever >/= 101).     apixaban 5 MG Tabs tablet  Commonly known as:  ELIQUIS  Take 1 tablet (5 mg total) by mouth 2 (two) times daily.     cefUROXime 250 MG tablet  Commonly known as:  CEFTIN  Take 1 tablet (250 mg total) by mouth 2 (two) times daily with a meal.     doxycycline 100 MG tablet   Commonly known as:  VIBRA-TABS  Take 1 tablet (100 mg total) by mouth every 12 (twelve) hours.     fentaNYL 50 MCG/HR  Commonly known as:  DURAGESIC - dosed mcg/hr  Place 50 mcg onto the skin every 3 (three) days.     gabapentin 400 MG capsule  Commonly known as:  NEURONTIN  Take 400 mg by mouth 4 (four) times daily.     lisinopril 5 MG tablet  Commonly known as:  PRINIVIL,ZESTRIL  Take 1 tablet (5 mg total) by mouth daily.     metoprolol tartrate 12.5 mg Tabs tablet  Commonly known as:  LOPRESSOR  Take 0.5 tablets (12.5 mg total) by mouth 2 (two) times daily.     nitroGLYCERIN 0.4 MG SL tablet  Commonly known as:  NITROSTAT  Place 0.4 mg under the tongue every 5 (five) minutes as needed for chest pain.     nortriptyline 75 MG capsule  Commonly known as:  PAMELOR  Take 150 mg by mouth at bedtime.  prochlorperazine 10 MG tablet  Commonly known as:  COMPAZINE  Take 10 mg by mouth every 8 (eight) hours as needed for nausea or vomiting.     traZODone 150 MG tablet  Commonly known as:  DESYREL  Take 150 mg by mouth at bedtime as needed for sleep.       Follow-up Information   Follow up with Alvester Chou, NP.   Specialty:  Nurse Practitioner   Contact information:   [facility] [place] [address] [phone]        The results of significant diagnostics from this hospitalization (including imaging, microbiology, ancillary and laboratory) are listed below for reference.    Significant Diagnostic Studies: Ct Soft Tissue Neck W Contrast  06/20/2014   CLINICAL DATA:  Painful right neck swelling  EXAM: CT NECK WITH CONTRAST  TECHNIQUE: Multidetector CT imaging of the neck was performed using the standard protocol following the bolus administration of intravenous contrast.  CONTRAST:  124mL OMNIPAQUE IOHEXOL 300 MG/ML  SOLN  COMPARISON:  02/14/2012.  FINDINGS: In the right parotid gland inferiorly, there is a  well-circumscribed peripherally enhancing lesion which measures 1.9 x 1.7 cm. Some surrounding edematous changes are noted extending into the overlying skin. These changes are likely related to an underlying infectious process. Revaluation following appropriate therapy is recommended. Some small adjacent lymph nodes are noted although there are not significant by size criteria. These extend inferiorly along the lateral aspect of the neck beneath the sternocleidomastoid muscle. No other focal min mass lesion is noted.  The lung apices are within normal limits. Significant atherosclerotic changes of the thoracic aorta are noted with at least 2 areas of focal aortic blisters which are stable in appearance from previous CT of the chest from 02/14/2012. Calcifications are noted within the knee origins of the brachiocephalic vessels. Heavy carotid calcifications are noted at the bifurcation. Thyroid hypodensities are again identified and stable from the prior exam. Degenerative change of the cervical spine is noted. The skull base and its contents are within normal limits.  IMPRESSION: 1.9 cm peripherally enhancing lesion within the inferior aspect of the right parotid gland with overlying the edematous tissue. This likely represents a focus of sialoadenitis and followup is recommended following appropriate therapy. If the lesion persists following appropriate therapy, further evaluation by means of biopsy may be indicated.  Diffuse atherosclerotic disease within the aorta which is stable from prior exam.   Electronically Signed   By: Inez Catalina M.D.   On: 06/20/2014 13:29   Ct Abdomen Pelvis W Contrast  06/16/2014   CLINICAL DATA:  Abdominal pain for 2 weeks, history of colon cancer  EXAM: CT ABDOMEN AND PELVIS WITH CONTRAST  TECHNIQUE: Multidetector CT imaging of the abdomen and pelvis was performed using the standard protocol following bolus administration of intravenous contrast.  CONTRAST:  71mL OMNIPAQUE  IOHEXOL 300 MG/ML SOLN, 143mL OMNIPAQUE IOHEXOL 300 MG/ML SOLN  COMPARISON:  01/31/2014  FINDINGS: Bibasilar atelectasis.  No acute musculoskeletal findings.  Sub cm low-attenuation lesion caudate lobe of the liver posterior to the right portal vein, stable. Liver otherwise normal. Gallbladder sludge or mildly calcified gallstones with several foci of hyperattenuation within the gallbladder lumen, stable. Splenic calcification stable. Pancreatic atrophy stable. Adrenal glands normal. Bilateral renal vascular calcification and mild cortical atrophy stable.  The patient is status post transverse colostomy. Colostomy site unremarkable. Distal colon decompressed. No abnormally dilated loops of bowel.  Bladder wall thickening diffusely, stable. No  ascites or significant adenopathy. Extensive atherosclerotic calcification of the aorta with calcification and plaque. Infrarenal abdominal aorta dilated to 3.1 cm.  IMPRESSION: Stable postoperative change with no acute abnormalities. Colostomy site unremarkable. Multiple nonacute findings stable.   Electronically Signed   By: Skipper Cliche M.D.   On: 06/16/2014 23:29   Dg Chest Port 1 View  06/17/2014   CLINICAL DATA:  Infiltrates  EXAM: PORTABLE CHEST - 1 VIEW  COMPARISON:  09/07/2013  FINDINGS: No cardiomegaly. There is chronic tortuosity of the aorta, with particularly prominent aortic arch. Note that multiple saccular aneurysms were noted from the aortic arch and descending aorta on chest CT 02/14/2012.  Bandlike opacity at the medial right base, consistent with atelectasis or scar. There is no edema, consolidation, effusion, or pneumothorax.  IMPRESSION: Scarring or mild atelectasis at the right base. No edema or pneumonia.   Electronically Signed   By: Jorje Guild M.D.   On: 06/17/2014 02:10    Microbiology: Recent Results (from the past 240 hour(s))  URINE CULTURE     Status: None   Collection Time    06/16/14  8:27 PM      Result Value Ref Range Status    Specimen Description URINE, CATHETERIZED   Final   Special Requests NONE   Final   Culture  Setup Time     Final   Value: 06/17/2014 01:57     Performed at [facility]     Final   Value: >=100,000 COLONIES/ML     Performed at [facility]   Culture     Final   Value: ESCHERICHIA COLI     Performed at [facility]   Report Status 06/19/2014 FINAL   Final   Organism ID, Bacteria ESCHERICHIA COLI   Final     Labs: Basic Metabolic Panel:  Recent Labs Lab 06/16/14 2100 06/17/14 0513  NA 142 138  K 3.8 3.5*  CL 102 102  CO2 25 22  GLUCOSE 159* 130*  BUN 33* 27*  CREATININE 0.86 0.72  CALCIUM 9.0 8.5   Liver Function Tests:  Recent Labs Lab 06/16/14 2100 06/17/14 0513  AST 15 13  ALT 6 5  ALKPHOS 76 64  BILITOT <0.2* 0.3  PROT 7.7 7.5  ALBUMIN 2.9* 2.8*   No results found for this basename: LIPASE, AMYLASE,  in the last 168 hours No results found for this basename: AMMONIA,  in the last 168 hours CBC:  Recent Labs Lab 06/16/14 2100 06/17/14 0633 06/21/14 0531  WBC 10.2 10.9* 9.2  NEUTROABS 5.8 6.5  --   HGB 11.3* 11.0* 9.8*  HCT 36.4 33.8* 31.0*  MCV 84.5 83.0 85.6  PLT 417* 400 311   Cardiac Enzymes: No results found for this basename: CKTOTAL, CKMB, CKMBINDEX, TROPONINI,  in the last 168 hours BNP: BNP (last 3 results)  Recent Labs  09/03/13 0638  PROBNP 1298.0*   CBG:  Recent Labs Lab 06/20/14 1208 06/20/14 1624 06/20/14 2150 06/21/14 0839 06/21/14 1202  GLUCAP 173* 148* 133* 119* 155*       Signed:  Vernell Leep, MD, FACP, FHM. [facility] Pager [phone]  If 7PM-7AM, please contact night-coverage [email or web address] Password [email or web address] 06/21/2014, 5:11 PM

## 2014-06-24 ENCOUNTER — Telehealth: Payer: Self-pay | Admitting: *Deleted

## 2014-06-24 NOTE — Telephone Encounter (Signed)
Received fax from [facility]: They have been unable to reach pt to admit her. They are canceling referral at this time. Attempted to call pt to inform her we will be scheduling a one month office visit with labs. No answer at either number. Orders entered for schedulers to contact pt.

## 2014-06-25 ENCOUNTER — Telehealth: Payer: Self-pay | Admitting: Oncology

## 2014-06-25 NOTE — Telephone Encounter (Signed)
no vm available....mailed pt appt sched and avs for OCT

## 2014-07-23 ENCOUNTER — Ambulatory Visit: Payer: Medicare Other | Admitting: Nurse Practitioner

## 2014-07-23 ENCOUNTER — Other Ambulatory Visit: Payer: Medicare Other

## 2015-01-21 ENCOUNTER — Emergency Department (HOSPITAL_COMMUNITY): Payer: Medicare Other

## 2015-01-21 ENCOUNTER — Encounter (HOSPITAL_COMMUNITY): Payer: Self-pay

## 2015-01-21 ENCOUNTER — Inpatient Hospital Stay (HOSPITAL_COMMUNITY)
Admission: EM | Admit: 2015-01-21 | Discharge: 2015-01-26 | DRG: 193 | Disposition: A | Discharge status: Skilled Nursing Facility | Payer: Medicare Other | Attending: Internal Medicine | Admitting: Internal Medicine

## 2015-01-21 DIAGNOSIS — R4182 Altered mental status, unspecified: Secondary | ICD-10-CM | POA: Diagnosis present

## 2015-01-21 DIAGNOSIS — Z66 Do not resuscitate: Secondary | ICD-10-CM | POA: Diagnosis present

## 2015-01-21 DIAGNOSIS — N390 Urinary tract infection, site not specified: Secondary | ICD-10-CM

## 2015-01-21 DIAGNOSIS — B9620 Unspecified Escherichia coli [E. coli] as the cause of diseases classified elsewhere: Secondary | ICD-10-CM | POA: Diagnosis present

## 2015-01-21 DIAGNOSIS — T8351XA Infection and inflammatory reaction due to indwelling urinary catheter, initial encounter: Secondary | ICD-10-CM | POA: Diagnosis present

## 2015-01-21 DIAGNOSIS — J449 Chronic obstructive pulmonary disease, unspecified: Secondary | ICD-10-CM | POA: Diagnosis present

## 2015-01-21 DIAGNOSIS — B379 Candidiasis, unspecified: Secondary | ICD-10-CM | POA: Diagnosis present

## 2015-01-21 DIAGNOSIS — E119 Type 2 diabetes mellitus without complications: Secondary | ICD-10-CM | POA: Diagnosis present

## 2015-01-21 DIAGNOSIS — C189 Malignant neoplasm of colon, unspecified: Secondary | ICD-10-CM | POA: Diagnosis present

## 2015-01-21 DIAGNOSIS — F0390 Unspecified dementia without behavioral disturbance: Secondary | ICD-10-CM | POA: Diagnosis present

## 2015-01-21 DIAGNOSIS — N179 Acute kidney failure, unspecified: Secondary | ICD-10-CM | POA: Diagnosis present

## 2015-01-21 DIAGNOSIS — Y846 Urinary catheterization as the cause of abnormal reaction of the patient, or of later complication, without mention of misadventure at the time of the procedure: Secondary | ICD-10-CM | POA: Diagnosis present

## 2015-01-21 DIAGNOSIS — I480 Paroxysmal atrial fibrillation: Secondary | ICD-10-CM | POA: Diagnosis not present

## 2015-01-21 DIAGNOSIS — J189 Pneumonia, unspecified organism: Secondary | ICD-10-CM | POA: Diagnosis present

## 2015-01-21 DIAGNOSIS — Z90710 Acquired absence of both cervix and uterus: Secondary | ICD-10-CM | POA: Diagnosis not present

## 2015-01-21 DIAGNOSIS — Z9049 Acquired absence of other specified parts of digestive tract: Secondary | ICD-10-CM | POA: Diagnosis present

## 2015-01-21 DIAGNOSIS — I10 Essential (primary) hypertension: Secondary | ICD-10-CM | POA: Diagnosis present

## 2015-01-21 DIAGNOSIS — J9601 Acute respiratory failure with hypoxia: Secondary | ICD-10-CM | POA: Diagnosis present

## 2015-01-21 DIAGNOSIS — I5032 Chronic diastolic (congestive) heart failure: Secondary | ICD-10-CM | POA: Diagnosis present

## 2015-01-21 DIAGNOSIS — R079 Chest pain, unspecified: Secondary | ICD-10-CM

## 2015-01-21 DIAGNOSIS — F17210 Nicotine dependence, cigarettes, uncomplicated: Secondary | ICD-10-CM | POA: Diagnosis present

## 2015-01-21 DIAGNOSIS — Z8673 Personal history of transient ischemic attack (TIA), and cerebral infarction without residual deficits: Secondary | ICD-10-CM

## 2015-01-21 DIAGNOSIS — Z87440 Personal history of urinary (tract) infections: Secondary | ICD-10-CM

## 2015-01-21 LAB — URINALYSIS, ROUTINE W REFLEX MICROSCOPIC
BILIRUBIN URINE: NEGATIVE
GLUCOSE, UA: NEGATIVE mg/dL
Ketones, ur: NEGATIVE mg/dL
Nitrite: NEGATIVE
PH: 6 (ref 5.0–8.0)
Protein, ur: >300 mg/dL — AB
SPECIFIC GRAVITY, URINE: 1.031 — AB (ref 1.005–1.030)
UROBILINOGEN UA: 0.2 mg/dL (ref 0.0–1.0)

## 2015-01-21 LAB — COMPREHENSIVE METABOLIC PANEL
ALK PHOS: 64 U/L (ref 39–117)
ALT: 11 U/L (ref 0–35)
AST: 21 U/L (ref 0–37)
Albumin: 3.3 g/dL — ABNORMAL LOW (ref 3.5–5.2)
Anion gap: 11 (ref 5–15)
BILIRUBIN TOTAL: 0.6 mg/dL (ref 0.3–1.2)
BUN: 22 mg/dL (ref 6–23)
CO2: 28 mmol/L (ref 19–32)
Calcium: 8.5 mg/dL (ref 8.4–10.5)
Chloride: 100 mmol/L (ref 96–112)
Creatinine, Ser: 1.12 mg/dL — ABNORMAL HIGH (ref 0.50–1.10)
GFR calc Af Amer: 52 mL/min — ABNORMAL LOW (ref >90)
GFR calc non Af Amer: 44 mL/min — ABNORMAL LOW (ref >90)
GLUCOSE: 163 mg/dL — AB (ref 70–99)
Potassium: 3.1 mmol/L — ABNORMAL LOW (ref 3.5–5.1)
SODIUM: 139 mmol/L (ref 135–145)
Total Protein: 7.3 g/dL (ref 6.0–8.3)

## 2015-01-21 LAB — CBC WITH DIFFERENTIAL/PLATELET
Basophils Absolute: 0 10*3/uL (ref 0.0–0.1)
Basophils Relative: 0 % (ref 0–1)
EOS ABS: 0.1 10*3/uL (ref 0.0–0.7)
Eosinophils Relative: 1 % (ref 0–5)
HCT: 39.3 % (ref 36.0–46.0)
HEMOGLOBIN: 12.5 g/dL (ref 12.0–15.0)
LYMPHS ABS: 2.2 10*3/uL (ref 0.7–4.0)
LYMPHS PCT: 24 % (ref 12–46)
MCH: 28.4 pg (ref 26.0–34.0)
MCHC: 31.8 g/dL (ref 30.0–36.0)
MCV: 89.3 fL (ref 78.0–100.0)
Monocytes Absolute: 0.6 10*3/uL (ref 0.1–1.0)
Monocytes Relative: 7 % (ref 3–12)
NEUTROS ABS: 6.3 10*3/uL (ref 1.7–7.7)
NEUTROS PCT: 68 % (ref 43–77)
PLATELETS: 423 10*3/uL — AB (ref 150–400)
RBC: 4.4 MIL/uL (ref 3.87–5.11)
RDW: 14.8 % (ref 11.5–15.5)
WBC: 9.4 10*3/uL (ref 4.0–10.5)

## 2015-01-21 LAB — LIPASE, BLOOD: LIPASE: 18 U/L (ref 11–59)

## 2015-01-21 LAB — EXPECTORATED SPUTUM ASSESSMENT W GRAM STAIN, RFLX TO RESP C

## 2015-01-21 LAB — TROPONIN I: Troponin I: 0.03 ng/mL (ref <0.031)

## 2015-01-21 LAB — STREP PNEUMONIAE URINARY ANTIGEN: Strep Pneumo Urinary Antigen: NEGATIVE

## 2015-01-21 LAB — URINE MICROSCOPIC-ADD ON

## 2015-01-21 MED ORDER — DEXTROSE 5 % IV SOLN
1.0000 g | INTRAVENOUS | Status: DC
  Administered 2015-01-22 – 2015-01-26 (×5): 1 g via INTRAVENOUS
  Filled 2015-01-21 (×5): qty 10

## 2015-01-21 MED ORDER — SODIUM CHLORIDE 0.9 % IV SOLN
INTRAVENOUS | Status: AC
  Administered 2015-01-21: 20 mL/h via INTRAVENOUS
  Administered 2015-01-23 – 2015-01-25 (×3): via INTRAVENOUS

## 2015-01-21 MED ORDER — HEPARIN SODIUM (PORCINE) 5000 UNIT/ML IJ SOLN
5000.0000 [IU] | Freq: Three times a day (TID) | INTRAMUSCULAR | Status: DC
  Administered 2015-01-21 – 2015-01-26 (×14): 5000 [IU] via SUBCUTANEOUS
  Filled 2015-01-21 (×17): qty 1

## 2015-01-21 MED ORDER — AZITHROMYCIN 500 MG PO TABS
500.0000 mg | ORAL_TABLET | Freq: Every day | ORAL | Status: DC
  Administered 2015-01-22 – 2015-01-26 (×5): 500 mg via ORAL
  Filled 2015-01-21 (×5): qty 1

## 2015-01-21 MED ORDER — POTASSIUM CHLORIDE CRYS ER 20 MEQ PO TBCR
40.0000 meq | EXTENDED_RELEASE_TABLET | Freq: Once | ORAL | Status: AC
  Administered 2015-01-21: 40 meq via ORAL
  Filled 2015-01-21: qty 2

## 2015-01-21 MED ORDER — METOPROLOL TARTRATE 12.5 MG HALF TABLET
12.5000 mg | ORAL_TABLET | Freq: Two times a day (BID) | ORAL | Status: DC
  Administered 2015-01-21 – 2015-01-26 (×8): 12.5 mg via ORAL
  Filled 2015-01-21 (×11): qty 1

## 2015-01-21 MED ORDER — QUETIAPINE FUMARATE 25 MG PO TABS
25.0000 mg | ORAL_TABLET | Freq: Every day | ORAL | Status: DC
  Administered 2015-01-21 – 2015-01-25 (×5): 25 mg via ORAL
  Filled 2015-01-21 (×6): qty 1

## 2015-01-21 MED ORDER — CETYLPYRIDINIUM CHLORIDE 0.05 % MT LIQD
7.0000 mL | Freq: Two times a day (BID) | OROMUCOSAL | Status: DC
  Administered 2015-01-22 – 2015-01-25 (×7): 7 mL via OROMUCOSAL

## 2015-01-21 MED ORDER — NORTRIPTYLINE HCL 25 MG PO CAPS
50.0000 mg | ORAL_CAPSULE | Freq: Every day | ORAL | Status: DC
  Administered 2015-01-21 – 2015-01-25 (×5): 50 mg via ORAL
  Filled 2015-01-21 (×6): qty 2

## 2015-01-21 MED ORDER — ASPIRIN EC 81 MG PO TBEC
81.0000 mg | DELAYED_RELEASE_TABLET | Freq: Every day | ORAL | Status: DC
  Administered 2015-01-21 – 2015-01-26 (×6): 81 mg via ORAL
  Filled 2015-01-21 (×6): qty 1

## 2015-01-21 MED ORDER — ACETAMINOPHEN 325 MG PO TABS
650.0000 mg | ORAL_TABLET | Freq: Four times a day (QID) | ORAL | Status: DC | PRN
  Administered 2015-01-23 – 2015-01-25 (×3): 650 mg via ORAL
  Filled 2015-01-21 (×3): qty 2

## 2015-01-21 MED ORDER — AZITHROMYCIN 500 MG IV SOLR
500.0000 mg | INTRAVENOUS | Status: DC
  Administered 2015-01-21: 500 mg via INTRAVENOUS
  Filled 2015-01-21 (×2): qty 500

## 2015-01-21 MED ORDER — DEXTROSE 5 % IV SOLN
1.0000 g | INTRAVENOUS | Status: DC
  Administered 2015-01-21: 1 g via INTRAVENOUS
  Filled 2015-01-21 (×2): qty 10

## 2015-01-21 MED ORDER — GABAPENTIN 400 MG PO CAPS
400.0000 mg | ORAL_CAPSULE | Freq: Every day | ORAL | Status: DC
  Administered 2015-01-21 – 2015-01-23 (×9): 400 mg via ORAL
  Filled 2015-01-21 (×13): qty 1

## 2015-01-21 MED ORDER — CHLORHEXIDINE GLUCONATE 0.12 % MT SOLN
15.0000 mL | Freq: Two times a day (BID) | OROMUCOSAL | Status: DC
  Administered 2015-01-22 – 2015-01-26 (×6): 15 mL via OROMUCOSAL
  Filled 2015-01-21 (×12): qty 15

## 2015-01-21 NOTE — ED Provider Notes (Signed)
CSN: [id number]     Arrival date & time 01/21/15  1159 History   First MD Initiated Contact with Patient 01/21/15 1212     Chief Complaint  Patient presents with  . Hallucinations     (Consider location/radiation/quality/duration/timing/severity/associated sxs/prior Treatment) HPI Comments: Patient here with family noting decreased level of consciousness 3 days. No reported fever. History of UTI and this is similar. She denies any dysuria or hematuria. Daughter states that today is the patient's best day. No recent medication changes. No recent history of trauma. Slight cough noted without signs of breath. Some chest discomfort worse with coughing but without anginal type features. Nothing makes her mental status better or worse. No treatment used prior to arrival.  The history is provided by the patient and a relative.    Past Medical History  Diagnosis Date  . COPD (chronic obstructive pulmonary disease)   . CHF (congestive heart failure)     ECHO 2011  EF: 60% to 72%, grd 1 distolic dysfxn, 09-47% 0962 ECHO, tech limited by Afib  . Hypertension   . Stroke 2008    rt side deficit  . A-fib   . Diabetes mellitus   . Back pain   . Constipation   . SBO (small bowel obstruction)     per GI note in 2011, pt denies  . Upper GI bleed 2011    required hospitalization  . Colon polyps   . Tobacco abuse    Past Surgical History  Procedure Laterality Date  . Total abdominal hysterectomy  1980  . Colonoscopy N/A 09/04/2013    Procedure: COLONOSCOPY;  Surgeon: Lear Ng, MD;  Location: [facility] ENDOSCOPY;  Service: Endoscopy;  Laterality: N/A;  . Colon resection N/A 09/06/2013    Procedure: PARTIAL COLECTOMY WITH COLOSTOMY;  Surgeon: Earnstine Regal, MD;  Location: [facility];  Service: General;  Laterality: N/A;  . Colostomy revision N/A 09/08/2013    Procedure: COLOSTOMY REVISION;  Surgeon: Gwenyth Ober, MD;  Location: [facility];  Service: General;  Laterality: N/A;   History reviewed. No  pertinent family history. History  Substance Use Topics  . Smoking status: Current Every Day Smoker -- 0.50 packs/day for 15 years    Types: Cigarettes  . Smokeless tobacco: Not on file  . Alcohol Use: Yes   OB History    No data available     Review of Systems  All other systems reviewed and are negative.     Allergies  Aggrenox  Home Medications   Prior to Admission medications   Medication Sig Start Date End Date Taking? Authorizing Provider  acetaminophen (TYLENOL) 325 MG tablet Take 2 tablets (650 mg total) by mouth every 6 (six) hours as needed for mild pain, moderate pain or fever (or Fever >/= 101). 06/21/14   Modena Jansky, MD  apixaban (ELIQUIS) 5 MG TABS tablet Take 1 tablet (5 mg total) by mouth 2 (two) times daily. 06/21/14   Modena Jansky, MD  cefUROXime (CEFTIN) 250 MG tablet Take 1 tablet (250 mg total) by mouth 2 (two) times daily with a meal. 06/21/14   Modena Jansky, MD  doxycycline (VIBRA-TABS) 100 MG tablet Take 1 tablet (100 mg total) by mouth every 12 (twelve) hours. 06/21/14   Modena Jansky, MD  fentaNYL (DURAGESIC - DOSED MCG/HR) 50 MCG/HR Place 50 mcg onto the skin every 3 (three) days.    Historical Provider, MD  gabapentin (NEURONTIN) 400 MG capsule Take 400 mg by mouth 4 (  four) times daily.    Historical Provider, MD  lisinopril (PRINIVIL,ZESTRIL) 5 MG tablet Take 1 tablet (5 mg total) by mouth daily. 06/21/14   Modena Jansky, MD  metoprolol tartrate (LOPRESSOR) 12.5 mg TABS tablet Take 0.5 tablets (12.5 mg total) by mouth 2 (two) times daily. 06/21/14   Modena Jansky, MD  nitroGLYCERIN (NITROSTAT) 0.4 MG SL tablet Place 0.4 mg under the tongue every 5 (five) minutes as needed for chest pain.    Historical Provider, MD  nortriptyline (PAMELOR) 75 MG capsule Take 150 mg by mouth at bedtime.    Historical Provider, MD  prochlorperazine (COMPAZINE) 10 MG tablet Take 10 mg by mouth every 8 (eight) hours as needed for nausea or vomiting.     Historical Provider, MD  traZODone (DESYREL) 150 MG tablet Take 150 mg by mouth at bedtime as needed for sleep.     Historical Provider, MD   BP 163/47 mmHg  Pulse 73  Temp(Src) 98.2 F (36.8 C) (Oral)  Resp 14  Ht 4\' 10"  (1.473 m)  Wt 160 lb (72.576 kg)  BMI 33.45 kg/m2  SpO2 91% Physical Exam  Constitutional: She is oriented to person, place, and time. She appears well-developed and well-nourished.  Non-toxic appearance. No distress.  HENT:  Head: Normocephalic and atraumatic.  Eyes: Conjunctivae, EOM and lids are normal. Pupils are equal, round, and reactive to light.  Neck: Normal range of motion. Neck supple. No tracheal deviation present. No thyroid mass present.  Cardiovascular: Normal rate, regular rhythm and normal heart sounds.  Exam reveals no gallop.   No murmur heard. Pulmonary/Chest: Effort normal and breath sounds normal. No stridor. No respiratory distress. She has no decreased breath sounds. She has no wheezes. She has no rhonchi. She has no rales.  Abdominal: Soft. Normal appearance and bowel sounds are normal. She exhibits no distension. There is no tenderness. There is no rebound and no CVA tenderness.  Musculoskeletal: Normal range of motion. She exhibits no edema or tenderness.  Neurological: She is alert and oriented to person, place, and time. She has normal strength. No cranial nerve deficit or sensory deficit. GCS eye subscore is 4. GCS verbal subscore is 5. GCS motor subscore is 6.  Skin: Skin is warm and dry. No abrasion and no rash noted.  Psychiatric: She has a normal mood and affect. Her speech is normal and behavior is normal.  Nursing note and vitals reviewed.   ED Course  Procedures (including critical care time) Labs Review Labs Reviewed  URINE CULTURE  CBC WITH DIFFERENTIAL/PLATELET  COMPREHENSIVE METABOLIC PANEL  URINALYSIS, ROUTINE W REFLEX MICROSCOPIC  LIPASE, BLOOD  TROPONIN I    Imaging Review No results found.   EKG  Interpretation   Date/Time:  Tuesday January 21 2015 12:57:02 EDT Ventricular Rate:  86 PR Interval:  216 QRS Duration: 140 QT Interval:  431 QTC Calculation: 516 R Axis:   67 Text Interpretation:  Sinus tachycardia Borderline prolonged PR interval  Right bundle branch block Confirmed by Annai Heick  MD, Lakyn Alsteen ([id number]) on  01/21/2015 2:00:53 PM      MDM   Final diagnoses:  Chest pain    Patient with evidence of UTI as well as community acquired pneumonia. Placed on Zithromax as well as Rocephin and will be admitted to the hospital    Lacretia Leigh, MD 01/21/15 1411

## 2015-01-21 NOTE — ED Notes (Signed)
Bed: WA02 Expected date:  Expected time:  Means of arrival:  Comments: EMS- 81yo, hallucinations x 2 days, Hx of cva & CA

## 2015-01-21 NOTE — ED Notes (Signed)
Unable to get blood at this time patient fluids have to be stop because patient only has one arm for me to draw from and IV is in that hand.

## 2015-01-21 NOTE — H&P (Signed)
History and Physical   [name]:[id number] DOB: 1930/06/15 DOA: 01/21/2015  Referring physician: Dr. Zenia Resides PCP: Alvester Chou, NP  Specialists: none  Chief Complaint: AMS  HPI: [name] is a 81 y.o. female has a past medical history significant for COPD, diastolic CHF, recurrent UTIs, PAF, presents to the hospital brought by her daughter with worsening mental status and intermittent hallucinations in the past 3 days. Per family, this happens usually when she gets a UTI. She denies any fevers or chills. She endorses dyspnea for 1-2 days as well as one episode of chest pain yesterday which lasted about 5 minutes. She denies abdominal pain, has had nausea and one time episode of emesis. She denies dysuria. She has a chronic Foley catheter for intermittent urinary retention and this is managed by [place]. She never saw a Dealer, supposed to be referred soon. In the emergency room, patient is afebrile, she has mild AKI with a creatinine of 1.1 from normal baseline, her CBC is unremarkable, chest x-ray shows potential left-sided pneumonia and a urinalysis has evidence of urinary tract infection. TRH was asked for admission.  Review of Systems: as per HPI otherwise 10 point ROS negative  Past Medical History  Diagnosis Date  . COPD (chronic obstructive pulmonary disease)   . CHF (congestive heart failure)     ECHO 2011  EF: 60% to 95%, grd 1 distolic dysfxn, 62-13% 0865 ECHO, tech limited by Afib  . Hypertension   . Stroke 2008    rt side deficit  . A-fib   . Diabetes mellitus   . Back pain   . Constipation   . SBO (small bowel obstruction)     per GI note in 2011, pt denies  . Upper GI bleed 2011    required hospitalization  . Colon polyps   . Tobacco abuse    Past Surgical History  Procedure Laterality Date  . Total abdominal hysterectomy  1980  . Colonoscopy N/A 09/04/2013    Procedure: COLONOSCOPY;  Surgeon: Lear Ng, MD;  Location: [facility] ENDOSCOPY;   Service: Endoscopy;  Laterality: N/A;  . Colon resection N/A 09/06/2013    Procedure: PARTIAL COLECTOMY WITH COLOSTOMY;  Surgeon: Earnstine Regal, MD;  Location: [place];  Service: General;  Laterality: N/A;  . Colostomy revision N/A 09/08/2013    Procedure: COLOSTOMY REVISION;  Surgeon: Gwenyth Ober, MD;  Location: [place];  Service: General;  Laterality: N/A;   Social History:  reports that she has been smoking Cigarettes.  She has a 7.5 pack-year smoking history. She does not have any smokeless tobacco history on file. She reports that she drinks alcohol. She reports that she does not use illicit drugs.  Allergies  Allergen Reactions  . Aggrenox [Aspirin-Dipyridamole Er] Diarrhea and Nausea And Vomiting    & abdominal bleeding   Family history reviewed and negative  Prior to Admission medications   Medication Sig Start Date End Date Taking? Authorizing Provider  acetaminophen (TYLENOL) 325 MG tablet Take 2 tablets (650 mg total) by mouth every 6 (six) hours as needed for mild pain, moderate pain or fever (or Fever >/= 101). 06/21/14  Yes Modena Jansky, MD  gabapentin (NEURONTIN) 400 MG capsule Take 400 mg by mouth 5 (five) times daily.    Yes Historical Provider, MD  nitroGLYCERIN (NITROSTAT) 0.4 MG SL tablet Place 0.4 mg under the tongue every 5 (five) minutes as needed for chest pain.   Yes Historical Provider, MD  prochlorperazine (COMPAZINE)  10 MG tablet Take 10 mg by mouth every 8 (eight) hours as needed for nausea or vomiting.   Yes Historical Provider, MD  QUEtiapine (SEROQUEL) 25 MG tablet Take 1 tablet by mouth at bedtime. 01/03/15  Yes Historical Provider, MD  traMADol (ULTRAM) 50 MG tablet Take 1 tablet by mouth every 6 (six) hours as needed. pain 01/04/15  Yes Historical Provider, MD  apixaban (ELIQUIS) 5 MG TABS tablet Take 1 tablet (5 mg total) by mouth 2 (two) times daily. Patient not taking: Reported on 01/21/2015 06/21/14   Modena Jansky, MD  cefUROXime (CEFTIN) 250 MG tablet  Take 1 tablet (250 mg total) by mouth 2 (two) times daily with a meal. Patient not taking: Reported on 01/21/2015 06/21/14   Modena Jansky, MD  doxycycline (VIBRA-TABS) 100 MG tablet Take 1 tablet (100 mg total) by mouth every 12 (twelve) hours. Patient not taking: Reported on 01/21/2015 06/21/14   Modena Jansky, MD  lisinopril (PRINIVIL,ZESTRIL) 5 MG tablet Take 1 tablet (5 mg total) by mouth daily. Patient not taking: Reported on 01/21/2015 06/21/14   Modena Jansky, MD  metoprolol tartrate (LOPRESSOR) 12.5 mg TABS tablet Take 0.5 tablets (12.5 mg total) by mouth 2 (two) times daily. Patient not taking: Reported on 01/21/2015 06/21/14   Modena Jansky, MD  nortriptyline (PAMELOR) 75 MG capsule Take 150 mg by mouth at bedtime.    Historical Provider, MD   Physical Exam: Filed Vitals:   01/21/15 1203 01/21/15 1214  BP:  163/47  Pulse:  73  Temp:  98.2 F (36.8 C)  TempSrc:  Oral  Resp:  14  Height: 4\' 10"  (1.473 m)   Weight: 72.576 kg (160 lb)   SpO2:  91%    General:  No apparent distress,   Eyes: PERRL, EOMI, no scleral icterus  ENT: moist oropharynx  Neck: supple, no lymphadenopathy  Cardiovascular: regular rate without MRG; 2+ peripheral pulses, no JVD, no peripheral edema  Respiratory: CTA biL, good air movement without wheezing, rhonchi or crackled  Abdomen: soft, non tender to palpation, positive bowel sounds, no guarding, no rebound  Skin: no rashes  Musculoskeletal: normal bulk and tone, no joint swelling  Psychiatric: normal mood and affect  Neurologic: non focal   Labs on Admission:  Basic Metabolic Panel:  Recent Labs Lab 01/21/15 1237  NA 139  K 3.1*  CL 100  CO2 28  GLUCOSE 163*  BUN 22  CREATININE 1.12*  CALCIUM 8.5   Liver Function Tests:  Recent Labs Lab 01/21/15 1237  AST 21  ALT 11  ALKPHOS 64  BILITOT 0.6  PROT 7.3  ALBUMIN 3.3*    Recent Labs Lab 01/21/15 1237  LIPASE 18   CBC:  Recent Labs Lab 01/21/15 1237    WBC 9.4  NEUTROABS 6.3  HGB 12.5  HCT 39.3  MCV 89.3  PLT 423*   Cardiac Enzymes:  Recent Labs Lab 01/21/15 1237  TROPONINI 0.03   Radiological Exams on Admission: Dg Chest 2 View  01/21/2015   CLINICAL DATA:  Two day history of hallucinations. History of colon carcinoma  EXAM: CHEST  2 VIEW  COMPARISON:  June 17, 2014  FINDINGS: There is focal infiltrate in the right base. Lungs elsewhere clear. Heart is upper normal in size with pulmonary vascularity within normal limits. There is atherosclerotic change in the aorta. No adenopathy. There is anterior wedging of the T12 vertebral body.  IMPRESSION: Right base infiltrate.   Electronically Signed   By:  Lowella Grip III M.D.   On: 01/21/2015 13:48   EKG: Independently reviewed. Sinus rhythm.  Assessment/Plan Principal Problem:   CAP (community acquired pneumonia) Active Problems:   Acute respiratory failure   Paroxysmal atrial fibrillation   Adenocarcinoma, colon   UTI (lower urinary tract infection)   Hypertension   CAP  - We will admit the patient on pneumonia pathway, obtain blood cultures, sputum culture, urine Legionella and strep pneumo antigens  - Antibiotics with ceftriaxone and azithromycin   Urinary tract infection - Patient without known history of multidrug resistant organisms, in the past she's grown Escherichia coli which is sensitive to ceftriaxone.  - Continue antibiotics as above  - Urine culture sent  - Patient will likely benefit from a urology referral.   Acute hypoxemic respiratory failure - Patient with intermittent dyspnea at home, requiring oxygen - Wean off oxygen as tolerated  Paroxysmal A. Fib - patient currently with regular rhythm, sinus - She denies taking her Eliquis, this has been stopped as an outpatient a while ago - Continue aspirin   Adenocarcinoma of the colon - Status post surgery, this is followed as an outpatient  Dementia  - Acutely exacerbated by the presence of  urinary tract infection as well as community-acquired pneumonia    Diet: heart healthy Fluids: none DVT Prophylaxis: heparin  Code Status: DNR  Family Communication: daughter bedside  Disposition Plan: admit to tele   Costin M. Cruzita Lederer, MD [facility] Pager [phone]  If 7PM-7AM, please contact night-coverage [email or web address] Password [facility] 01/21/2015, 2:41 PM

## 2015-01-21 NOTE — ED Notes (Signed)
Main lab notified to collect lactic and blood cultures

## 2015-01-21 NOTE — ED Notes (Signed)
Lab called and stated her pneumococcal/Influenza needs to be recollected. Pt already off the floor, transiting to her 5th floor bed.

## 2015-01-21 NOTE — Progress Notes (Signed)
UR completed 

## 2015-01-21 NOTE — ED Notes (Signed)
I attempted to collect labs and was unsuccessful.  Made nurse aware and he called main lab.

## 2015-01-22 DIAGNOSIS — J189 Pneumonia, unspecified organism: Principal | ICD-10-CM

## 2015-01-22 LAB — CBC
HCT: 36.9 % (ref 36.0–46.0)
Hemoglobin: 11.5 g/dL — ABNORMAL LOW (ref 12.0–15.0)
MCH: 28.4 pg (ref 26.0–34.0)
MCHC: 31.2 g/dL (ref 30.0–36.0)
MCV: 91.1 fL (ref 78.0–100.0)
Platelets: 321 10*3/uL (ref 150–400)
RBC: 4.05 MIL/uL (ref 3.87–5.11)
RDW: 14.9 % (ref 11.5–15.5)
WBC: 8.2 10*3/uL (ref 4.0–10.5)

## 2015-01-22 LAB — LEGIONELLA ANTIGEN, URINE

## 2015-01-22 LAB — BASIC METABOLIC PANEL
ANION GAP: 7 (ref 5–15)
BUN: 25 mg/dL — AB (ref 6–23)
CHLORIDE: 104 mmol/L (ref 96–112)
CO2: 26 mmol/L (ref 19–32)
Calcium: 8 mg/dL — ABNORMAL LOW (ref 8.4–10.5)
Creatinine, Ser: 1.31 mg/dL — ABNORMAL HIGH (ref 0.50–1.10)
GFR calc non Af Amer: 37 mL/min — ABNORMAL LOW (ref >90)
GFR, EST AFRICAN AMERICAN: 43 mL/min — AB (ref >90)
Glucose, Bld: 134 mg/dL — ABNORMAL HIGH (ref 70–99)
POTASSIUM: 3.7 mmol/L (ref 3.5–5.1)
Sodium: 137 mmol/L (ref 135–145)

## 2015-01-22 NOTE — Progress Notes (Signed)
INITIAL NUTRITION ASSESSMENT   DOCUMENTATION CODES Per approved criteria  -Not Applicable   INTERVENTION: - Continue Carb Modified diet - Will monitor for need for supplements  NUTRITION DIAGNOSIS: Difficulty chewing related to missing teeth as evidenced by lost lower dentures, unable to locate.  Goal: Pt to meet >/= 90% estimated needs  Monitor:  Meal intakes, need for supplement, weight trends, labs  Reason for Assessment: Low Braden  81 y.o. female  Admitting Dx: CAP (community acquired pneumonia)  ASSESSMENT: Pt is an 81 year old with hx of CHF, COPD, HTN, DM, SBO, UGIB, stroke, and constipation. She was noted to have altered mental status with intermittent hallucinations PTA.  Pt seen for low Braden. BMI indicates borderline overweight and obesity. Pt ate 0% of dinner last night. Daughter indicates that pt's appetite was decreased for 3-5 days PTA due to current condition but that prior to that she was eating very well. Daughter states that pt's lower dentures were lost at [facility]; pt feels softer foods work better for her right now because of this. Daughter and pt deny recent weight changes.  Daughter states some miscommunication with lunch order today so pt did not eat as well because it was not something she preferred.   Pt likely not fully meeting needs at this time. Will monitor for need for supplements. Labs and medications reviewed; BUN/creatinine continue to trend up, Ca low, GFR: 37. No signs of muscle or fat wasting or edema during physical exam.  Height: Ht Readings from Last 1 Encounters:  01/21/15 4\' 10"  (1.473 m)    Weight: Wt Readings from Last 1 Encounters:  01/21/15 141 lb 8.6 oz (64.2 kg)    Ideal Body Weight: 90 lbs (40.91 kg)  % Ideal Body Weight: 157%  Wt Readings from Last 10 Encounters:  01/21/15 141 lb 8.6 oz (64.2 kg)  06/21/14 156 lb 11.2 oz (71.079 kg)  10/09/13 143 lb 11.2 oz (65.182 kg)  09/13/13 161 lb 4.8 oz (73.165 kg)     Usual Body Weight: 140 lbs (63.64 kg)  % Usual Body Weight: 101%  BMI:  Body mass index is 29.59 kg/(m^2).  Estimated Nutritional Needs: Kcal: 1300-1500 Protein: 60-80 grams Fluid: 2L/day  Skin: bilateral arm abrasions  Diet Order: Diet Carb Modified Fluid consistency:: Thin; Room service appropriate?: Yes  EDUCATION NEEDS: -No education needs identified at this time   Intake/Output Summary (Last 24 hours) at 01/22/15 1258 Last data filed at 01/22/15 0500  Gross per 24 hour  Intake      0 ml  Output    150 ml  Net   -150 ml    Last BM: PTA   Labs:   Recent Labs Lab 01/21/15 1237 01/22/15 0610  NA 139 137  K 3.1* 3.7  CL 100 104  CO2 28 26  BUN 22 25*  CREATININE 1.12* 1.31*  CALCIUM 8.5 8.0*  GLUCOSE 163* 134*    CBG (last 3)  No results for input(s): GLUCAP in the last 72 hours.  Scheduled Meds: . antiseptic oral rinse  7 mL Mouth Rinse q12n4p  . aspirin EC  81 mg Oral Daily  . azithromycin  500 mg Oral Daily  . cefTRIAXone (ROCEPHIN)  IV  1 g Intravenous Q24H  . chlorhexidine  15 mL Mouth Rinse BID  . gabapentin  400 mg Oral 5 X Daily  . heparin subcutaneous  5,000 Units Subcutaneous 3 times per day  . metoprolol tartrate  12.5 mg Oral BID  .  nortriptyline  50 mg Oral QHS  . QUEtiapine  25 mg Oral QHS    Continuous Infusions: . sodium chloride 20 mL/hr (01/21/15 1320)    Past Medical History  Diagnosis Date  . COPD (chronic obstructive pulmonary disease)   . CHF (congestive heart failure)     ECHO 2011  EF: 60% to 37%, grd 1 distolic dysfxn, 54-36% 0677 ECHO, tech limited by Afib  . Hypertension   . Stroke 2008    rt side deficit  . A-fib   . Diabetes mellitus   . Back pain   . Constipation   . SBO (small bowel obstruction)     per GI note in 2011, pt denies  . Upper GI bleed 2011    required hospitalization  . Colon polyps   . Tobacco abuse     Past Surgical History  Procedure Laterality Date  . Total abdominal  hysterectomy  1980  . Colonoscopy N/A 09/04/2013    Procedure: COLONOSCOPY;  Surgeon: Lear Ng, MD;  Location: [facility] ENDOSCOPY;  Service: Endoscopy;  Laterality: N/A;  . Colon resection N/A 09/06/2013    Procedure: PARTIAL COLECTOMY WITH COLOSTOMY;  Surgeon: Earnstine Regal, MD;  Location: [place];  Service: General;  Laterality: N/A;  . Colostomy revision N/A 09/08/2013    Procedure: COLOSTOMY REVISION;  Surgeon: Gwenyth Ober, MD;  Location: [place];  Service: General;  Laterality: N/A;    Jarome Matin, RD, LDN Inpatient Clinical Dietitian Pager # [phone] After hours/weekend pager # [phone]

## 2015-01-22 NOTE — Progress Notes (Signed)
Patient Demographics  [name], is a 81 y.o. female, DOB - 1930/06/29, MHD:[id number]  Admit date - 01/21/2015   Admitting Physician Costin Karlyne Greenspan, MD  Outpatient Primary MD for the patient is Alvester Chou, NP  LOS - 1   Chief Complaint  Patient presents with  . Hallucinations      History of present illness/brief narrative:  [name] is a 81 y.o. female has a past medical history significant for COPD, diastolic CHF, recurrent UTIs, PAF, presents to the hospital brought by her daughter with worsening mental status and intermittent hallucinations in the past 3 days. Per family, this happens usually when she gets a UTI. She denies any fevers or chills. She endorses dyspnea for 1-2 days as well as one episode of chest pain yesterday which lasted about 5 minutes. She denies abdominal pain, has had nausea and one time episode of emesis.  chest x-ray shows potential left-sided pneumonia and a urinalysis has evidence of urinary tract infection.   Subjective:   Derrick Orris today has, No headache, No chest pain, No abdominal pain - No Nausea, No new weakness tingling or numbness, port some mild dyspnea, as well reports some cough.  Assessment & Plan    Principal Problem:   CAP (community acquired pneumonia) Active Problems:   Acute respiratory failure   Paroxysmal atrial fibrillation   Adenocarcinoma, colon   UTI (lower urinary tract infection)   Hypertension  CAP  - On ceftriaxone and azithromycin 4/26 - Blood cultures no growth to date - Sputum culture not representative of lower respiratory secretions - Trip pneumonia and legionella are negative  Urinary tract infection - Patient without known history of multidrug resistant organisms, in the past she's grown Escherichia coli which is sensitive to ceftriaxone.  - Continue with Rocephin -  Follow on urine culture  Acute hypoxemic respiratory failure - Patient with intermittent dyspnea at home, requiring oxygen - Wean off oxygen as tolerated  Paroxysmal A. Fib - patient currently with regular rhythm, sinus - She denies taking her Eliquis, this has been stopped as an outpatient a while ago - Continue aspirin   Adenocarcinoma of the colon - Status post surgery, this is followed as an outpatient  Dementia  - Acutely exacerbated by the presence of urinary tract infection as well as community-acquired pneumonia   Code Status: DO NOT RESUSCITATE  Family Communication: None at bedside  Disposition Plan: Following with hospice as an outpatient   Procedures  None   Consults   None   Medications  Scheduled Meds: . antiseptic oral rinse  7 mL Mouth Rinse q12n4p  . aspirin EC  81 mg Oral Daily  . azithromycin  500 mg Oral Daily  . cefTRIAXone (ROCEPHIN)  IV  1 g Intravenous Q24H  . chlorhexidine  15 mL Mouth Rinse BID  . gabapentin  400 mg Oral 5 X Daily  . heparin subcutaneous  5,000 Units Subcutaneous 3 times per day  . metoprolol tartrate  12.5 mg Oral BID  . nortriptyline  50 mg Oral QHS  . QUEtiapine  25 mg Oral QHS   Continuous Infusions: . sodium chloride 20 mL/hr (01/21/15 1320)   PRN Meds:.acetaminophen  DVT Prophylaxis  - Heparin -   Lab  Results  Component Value Date   PLT 321 01/22/2015    Antibiotics    Anti-infectives    Start     Dose/Rate Route Frequency Ordered Stop   01/22/15 1000  azithromycin (ZITHROMAX) tablet 500 mg     500 mg Oral Daily 01/21/15 1439 01/29/15 0959   01/22/15 0600  cefTRIAXone (ROCEPHIN) 1 g in dextrose 5 % 50 mL IVPB     1 g 100 mL/hr over 30 Minutes Intravenous Every 24 hours 01/21/15 1439 01/29/15 0559   01/21/15 1415  azithromycin (ZITHROMAX) 500 mg in dextrose 5 % 250 mL IVPB  Status:  Discontinued     500 mg 250 mL/hr over 60 Minutes Intravenous Every 24 hours 01/21/15 1402 01/22/15 1032   01/21/15  1415  cefTRIAXone (ROCEPHIN) 1 g in dextrose 5 % 50 mL IVPB  Status:  Discontinued     1 g 100 mL/hr over 30 Minutes Intravenous Every 24 hours 01/21/15 1402 01/22/15 1033          Objective:   Filed Vitals:   01/21/15 1640 01/21/15 2133 01/22/15 0545 01/22/15 1420  BP: 137/95 133/53 108/62 102/42  Pulse: 86 76  64  Temp: 98.2 F (36.8 C) 98.4 F (36.9 C) 97.8 F (36.6 C) 97.3 F (36.3 C)  TempSrc: Oral Oral Oral Oral  Resp: 18 16 16 18   Height: 4\' 10"  (1.473 m)     Weight: 64.2 kg (141 lb 8.6 oz)     SpO2: 100% 100% 100% 100%    Wt Readings from Last 3 Encounters:  01/21/15 64.2 kg (141 lb 8.6 oz)  06/21/14 71.079 kg (156 lb 11.2 oz)  10/09/13 65.182 kg (143 lb 11.2 oz)     Intake/Output Summary (Last 24 hours) at 01/22/15 1500 Last data filed at 01/22/15 0500  Gross per 24 hour  Intake      0 ml  Output    150 ml  Net   -150 ml     Physical Exam  Exam Awake Alert, Oriented , No new F.N deficits, Normal affect [place].AT,PERRAL Supple Neck,No JVD, No cervical lymphadenopathy appriciated.  Symmetrical Chest wall movement, Good air movement bilaterally,  RRR,No Gallops,Rubs or new Murmurs, No Parasternal Heave +ve B.Sounds, Abd Soft, Non tender, No organomegaly appriciated, No rebound -guarding or rigidity. No Cyanosis, Clubbing or edema, No new Rash or bruise   Data Review   Micro Results Recent Results (from the past 240 hour(s))  Culture, sputum-assessment     Status: None   Collection Time: 01/21/15  3:40 PM  Result Value Ref Range Status   Specimen Description SPUTUM  Final   Special Requests NONE  Final   Sputum evaluation   Final    MICROSCOPIC FINDINGS SUGGEST THAT THIS SPECIMEN IS NOT REPRESENTATIVE OF LOWER RESPIRATORY SECRETIONS. PLEASE RECOLLECT. INFORMED WILLIAM @1607  ON 4.26.16 BY MCCOY,N.    Report Status 01/21/2015 FINAL  Final    Radiology Reports Dg Chest 2 View  01/21/2015   CLINICAL DATA:  Two day history of hallucinations.  History of colon carcinoma  EXAM: CHEST  2 VIEW  COMPARISON:  June 17, 2014  FINDINGS: There is focal infiltrate in the right base. Lungs elsewhere clear. Heart is upper normal in size with pulmonary vascularity within normal limits. There is atherosclerotic change in the aorta. No adenopathy. There is anterior wedging of the T12 vertebral body.  IMPRESSION: Right base infiltrate.   Electronically Signed   By: Lowella Grip III M.D.   On: 01/21/2015  13:48    CBC  Recent Labs Lab 01/21/15 1237 01/22/15 0610  WBC 9.4 8.2  HGB 12.5 11.5*  HCT 39.3 36.9  PLT 423* 321  MCV 89.3 91.1  MCH 28.4 28.4  MCHC 31.8 31.2  RDW 14.8 14.9  LYMPHSABS 2.2  --   MONOABS 0.6  --   EOSABS 0.1  --   BASOSABS 0.0  --     Chemistries   Recent Labs Lab 01/21/15 1237 01/22/15 0610  NA 139 137  K 3.1* 3.7  CL 100 104  CO2 28 26  GLUCOSE 163* 134*  BUN 22 25*  CREATININE 1.12* 1.31*  CALCIUM 8.5 8.0*  AST 21  --   ALT 11  --   ALKPHOS 64  --   BILITOT 0.6  --    ------------------------------------------------------------------------------------------------------------------ estimated creatinine clearance is 26.2 mL/min (by C-G formula based on Cr of 1.31). ------------------------------------------------------------------------------------------------------------------ No results for input(s): HGBA1C in the last 72 hours. ------------------------------------------------------------------------------------------------------------------ No results for input(s): CHOL, HDL, LDLCALC, TRIG, CHOLHDL, LDLDIRECT in the last 72 hours. ------------------------------------------------------------------------------------------------------------------ No results for input(s): TSH, T4TOTAL, T3FREE, THYROIDAB in the last 72 hours.  Invalid input(s): FREET3 ------------------------------------------------------------------------------------------------------------------ No results for input(s):  VITAMINB12, FOLATE, FERRITIN, TIBC, IRON, RETICCTPCT in the last 72 hours.  Coagulation profile No results for input(s): INR, PROTIME in the last 168 hours.  No results for input(s): DDIMER in the last 72 hours.  Cardiac Enzymes  Recent Labs Lab 01/21/15 1237  TROPONINI 0.03   ------------------------------------------------------------------------------------------------------------------ Invalid input(s): POCBNP     Time Spent in minutes   30 minutes   Diamonds Lippard M.D on 01/22/2015 at 3:00 PM  Between 7am to 7pm - Pager - [phone]  After 7pm go to [email or web address] - password [email or web address]  And look for the night coverage person covering for me after hours  [facility] Office  [phone]   **Disclaimer: This note may have been dictated with voice recognition software. Similar sounding words can inadvertently be transcribed and this note may contain transcription errors which may not have been corrected upon publication of note.**

## 2015-01-23 LAB — BASIC METABOLIC PANEL
Anion gap: 9 (ref 5–15)
BUN: 38 mg/dL — ABNORMAL HIGH (ref 6–23)
CALCIUM: 8.5 mg/dL (ref 8.4–10.5)
CO2: 25 mmol/L (ref 19–32)
Chloride: 103 mmol/L (ref 96–112)
Creatinine, Ser: 2.1 mg/dL — ABNORMAL HIGH (ref 0.50–1.10)
GFR calc Af Amer: 24 mL/min — ABNORMAL LOW (ref >90)
GFR, EST NON AFRICAN AMERICAN: 21 mL/min — AB (ref >90)
Glucose, Bld: 135 mg/dL — ABNORMAL HIGH (ref 70–99)
Potassium: 3.9 mmol/L (ref 3.5–5.1)
Sodium: 137 mmol/L (ref 135–145)

## 2015-01-23 LAB — CBC
HCT: 34.7 % — ABNORMAL LOW (ref 36.0–46.0)
Hemoglobin: 10.1 g/dL — ABNORMAL LOW (ref 12.0–15.0)
MCH: 26.8 pg (ref 26.0–34.0)
MCHC: 29.1 g/dL — ABNORMAL LOW (ref 30.0–36.0)
MCV: 92 fL (ref 78.0–100.0)
Platelets: 297 10*3/uL (ref 150–400)
RBC: 3.77 MIL/uL — ABNORMAL LOW (ref 3.87–5.11)
RDW: 15.1 % (ref 11.5–15.5)
WBC: 8 10*3/uL (ref 4.0–10.5)

## 2015-01-23 MED ORDER — GABAPENTIN 100 MG PO CAPS
200.0000 mg | ORAL_CAPSULE | Freq: Three times a day (TID) | ORAL | Status: DC
  Administered 2015-01-23 – 2015-01-26 (×9): 200 mg via ORAL
  Filled 2015-01-23 (×11): qty 2

## 2015-01-23 NOTE — Clinical Documentation Improvement (Signed)
Please specify diagnosis related to below supporting information, if appropriate.   Possible Clinical Conditions? _______  UTI secondary to chronic foley catheter  _______  UTI secondary to unknown etiology _______  Other Condition__________________ _______  [name] Clinically Determine   Supporting Information: Per 01/21/15 H&P = She has a chronic Foley catheter for intermittent urinary retention and this is managed by ObGyn. She never saw a Dealer, supposed to be referred soon. In the emergency room, patient is afebrile, she has mild AKI with a creatinine of 1.1 from normal baseline, her CBC is unremarkable, chest x-ray shows potential left-sided pneumonia and a urinalysis has evidence of urinary tract infection.     Thank You, Serena Colonel ,RN Clinical Documentation Specialist:  [facility] [employer]

## 2015-01-23 NOTE — Progress Notes (Signed)
CARE MANAGEMENT NOTE 01/23/2015  Patient:  [name], [name]   Account Number:  [id number]  Date Initiated:  01/23/2015  Documentation initiated by:  Edwyna Shell  Subjective/Objective Assessment:   81 yo female admitted with pna and uti from home     Action/Plan:   discharge planning   Anticipated DC Date:  01/25/2015   Anticipated DC Plan:  [place]  CM consult      [place]   Choice offered to / List presented to:             [place].   Status of service:  In process, will continue to follow Medicare Important Message given?   (If response is "NO", the following Medicare IM given date fields will be blank) Date Medicare IM given:   Medicare IM given by:   Date Additional Medicare IM given:   Additional Medicare IM given by:    Discharge Disposition:  [place]  Per UR Regulation:    If discussed at Long Length of Stay Meetings, dates discussed:    Comments:  01/23/15 Leanne Chang RN BSN CM [phone] Spoke with patient daughter [name] and she stated that the patient lives at home with her and her granddaughter and grandson and there is someone there 24/7. She stated that the patient was discharged from hospice in 2015 becasue she didn't meet eligibility. Her PCP is [facility] with [facility]. Raelyn Number. She stated that they have a hospital bed, wheelchair and the PCP ordered a hoyer lift and they are waiting for [facility] to deliver it. [name] stated that they have had [place] services in the past from [facility], [facility], and [place] and her primary choice is with [facility] fro [facility] services. Made [facility] rep Kristen aware, awaiting orders.

## 2015-01-23 NOTE — Evaluation (Signed)
Physical Therapy Evaluation Patient Details Name: [name] MRN: [id number] DOB: [birth year] Today's Date: 01/23/2015   History of Present Illness  81 y.o. female has a past medical history significant for COPD, diastolic CHF, recurrent UTIs, PAF, colostomy, stroke with R sided deficits and admitted for CAP and UTI.  Clinical Impression  Pt admitted with above diagnosis. Pt currently with functional limitations due to the deficits listed below (see PT Problem List).  Pt will benefit from skilled PT to increase their independence and safety with mobility to allow discharge to the venue listed below.   No family present however per chart from home with hospice.  Uncertain of pt's baseline however pt required total assist for bed mobility today so may require SNF upon d/c depending on level of assistance at home.  Pt very pleasant however reports increased fatigue and weakness.     Follow Up Recommendations SNF;Supervision/Assistance - 24 hour    Equipment Recommendations  Wheelchair (measurements PT);Hospital bed    Recommendations for Other Services       Precautions / Restrictions Precautions Precautions: Fall Precaution Comments: L colostomy      Mobility  Bed Mobility Overal bed mobility: Needs Assistance;+2 for physical assistance Bed Mobility: Supine to Sit;Sit to Supine     Supine to sit: +2 for physical assistance;Total assist Sit to supine: +2 for physical assistance;Total assist   General bed mobility comments: pt attempted to assist however reports feeling too weak, able to assist slightly pulling up with UEs, unable to sit upright without increased assist, also reports fatigue  Transfers                    Ambulation/Gait                Stairs            Wheelchair Mobility    Modified Rankin (Stroke Patients Only)       Balance Overall balance assessment: Needs assistance Sitting-balance support: Bilateral upper extremity  supported;Feet unsupported Sitting balance-Leahy Scale: Zero   Postural control: Posterior lean                                   Pertinent Vitals/Pain Pain Assessment: No/denies pain  Remained on O2 [place] in room    Home Living Family/patient expects to be discharged to:: Private residence Living Arrangements: Children (daughter)               Additional Comments: pt reports she is ambulatory without assistive device however admitted with cognition changes, per chart has hospice at home, no family present    Prior Function Level of Independence: Needs assistance         Comments: likely requires assist however no family present     Hand Dominance        Extremity/Trunk Assessment   Upper Extremity Assessment: Generalized weakness;RUE deficits/detail RUE Deficits / Details: flexion contractures present, hx of CVA         Lower Extremity Assessment: Generalized weakness;RLE deficits/detail;LLE deficits/detail RLE Deficits / Details: very limited active movement observed LLE Deficits / Details: L ankle contracture in increased PF     Communication   Communication: No difficulties  Cognition Arousal/Alertness: Awake/alert Behavior During Therapy: WFL for tasks assessed/performed Overall Cognitive Status: No family/caregiver present to determine baseline cognitive functioning  General Comments      Exercises        Assessment/Plan    PT Assessment Patient needs continued PT services  PT Diagnosis Generalized weakness   PT Problem List Decreased strength;Decreased activity tolerance;Decreased balance;Decreased mobility  PT Treatment Interventions DME instruction;Functional mobility training;Patient/family education;Wheelchair mobility training;Therapeutic activities;Therapeutic exercise;Balance training   PT Goals (Current goals can be found in the Care Plan section) Acute Rehab PT Goals PT Goal Formulation:  With patient Time For Goal Achievement: 02/06/15 Potential to Achieve Goals: Fair    Frequency Min 2X/week   Barriers to discharge        Co-evaluation               End of Session   Activity Tolerance: Patient limited by fatigue Patient left: in bed;with call bell/phone within reach Nurse Communication: Mobility status         Time: 1040-1100 PT Time Calculation (min) (ACUTE ONLY): 20 min   Charges:   PT Evaluation $Initial PT Evaluation Tier I: 1 Procedure     PT G Codes:        Jenessa Gillingham,KATHrine E 01/23/2015, 11:22 AM Carmelia Bake, PT, DPT 01/23/2015 Pager: [phone]

## 2015-01-23 NOTE — Progress Notes (Signed)
Patient Demographics  [name], is a 81 y.o. female, DOB - 1929-12-01, TKW:[id number]  Admit date - 01/21/2015   Admitting Physician Costin Karlyne Greenspan, MD  Outpatient Primary MD for the patient is Alvester Chou, NP  LOS - 2   Chief Complaint  Patient presents with  . Hallucinations      History of present illness/brief narrative:  [name] is a 81 y.o. female has a past medical history significant for COPD, diastolic CHF, recurrent UTIs, PAF, presents to the hospital brought by her daughter with worsening mental status and intermittent hallucinations in the past 3 days. Per family, this happens usually when she gets a UTI. She denies any fevers or chills. She endorses dyspnea for 1-2 days as well as one episode of chest pain yesterday which lasted about 5 minutes. She denies abdominal pain, has had nausea and one time episode of emesis.  chest x-ray shows potential left-sided pneumonia and a urinalysis has evidence of urinary tract infection.   Subjective:   [name] today has, No headache, No chest pain, No abdominal pain - No Nausea, No new weakness tingling or numbness, port some mild dyspnea, as well reports some cough.  Assessment & Plan    Principal Problem:   CAP (community acquired pneumonia) Active Problems:   Acute respiratory failure   Paroxysmal atrial fibrillation   Adenocarcinoma, colon   UTI (lower urinary tract infection)   Hypertension  CAP  - On ceftriaxone and azithromycin 4/26 - Sputum culture not representative of lower respiratory secretions - Strep pneumonia and legionella are negative  Urinary tract infection with chronic indwelling Foley catheter . - Patient without known history of multidrug resistant organisms, in the past she's grown Escherichia coli which is sensitive to ceftriaxone.  - Continue with Rocephin  4/26 urine culture growing Escherichia coli,sensitivity still  pending . - We'll change Foley catheter   Acute renal failure  -  patient has elevated BUN level, so this is most likely prerenal, will start on IV fluids and recheck BMP in a.m. .  Acute hypoxemic respiratory failure - Patient with intermittent dyspnea at home, requiring oxygen - Wean off oxygen as tolerated  Paroxysmal A. Fib - patient currently with regular rhythm, sinus - She denies taking her Eliquis, this has been stopped as an outpatient a while ago - Continue aspirin   Adenocarcinoma of the colon - Status post surgery, this is followed as an outpatient  Dementia  - Acutely exacerbated by the presence of urinary tract infection as well as community-acquired pneumonia   Code Status: DO NOT RESUSCITATE  Family Communication: None at bedside  Disposition Plan: remains inpatient   Procedures  None   Consults   None   Medications  Scheduled Meds: . antiseptic oral rinse  7 mL Mouth Rinse q12n4p  . aspirin EC  81 mg Oral Daily  . azithromycin  500 mg Oral Daily  . cefTRIAXone (ROCEPHIN)  IV  1 g Intravenous Q24H  . chlorhexidine  15 mL Mouth Rinse BID  . gabapentin  200 mg Oral TID  . heparin subcutaneous  5,000 Units Subcutaneous 3 times per day  . metoprolol tartrate  12.5 mg Oral BID  . nortriptyline  50 mg Oral QHS  .  QUEtiapine  25 mg Oral QHS   Continuous Infusions: . sodium chloride 100 mL/hr at 01/23/15 0959   PRN Meds:.acetaminophen  DVT Prophylaxis  - Heparin -   Lab Results  Component Value Date   PLT 297 01/23/2015    Antibiotics    Anti-infectives    Start     Dose/Rate Route Frequency Ordered Stop   01/22/15 1000  azithromycin (ZITHROMAX) tablet 500 mg     500 mg Oral Daily 01/21/15 1439 01/29/15 0959   01/22/15 0600  cefTRIAXone (ROCEPHIN) 1 g in dextrose 5 % 50 mL IVPB     1 g 100 mL/hr over 30 Minutes Intravenous Every 24 hours 01/21/15 1439 01/29/15 0559   01/21/15  1415  azithromycin (ZITHROMAX) 500 mg in dextrose 5 % 250 mL IVPB  Status:  Discontinued     500 mg 250 mL/hr over 60 Minutes Intravenous Every 24 hours 01/21/15 1402 01/22/15 1032   01/21/15 1415  cefTRIAXone (ROCEPHIN) 1 g in dextrose 5 % 50 mL IVPB  Status:  Discontinued     1 g 100 mL/hr over 30 Minutes Intravenous Every 24 hours 01/21/15 1402 01/22/15 1033          Objective:   Filed Vitals:   01/22/15 1759 01/22/15 2118 01/23/15 0538 01/23/15 1311  BP:  108/50 112/57 105/59  Pulse: 60 55 68 71  Temp: 98 F (36.7 C) 98.2 F (36.8 C) 98.4 F (36.9 C) 98.5 F (36.9 C)  TempSrc: Oral Oral Oral Oral  Resp:  20 20 18   Height:      Weight:      SpO2: 100% 98% 98% 97%    Wt Readings from Last 3 Encounters:  01/21/15 64.2 kg (141 lb 8.6 oz)  06/21/14 71.079 kg (156 lb 11.2 oz)  10/09/13 65.182 kg (143 lb 11.2 oz)     Intake/Output Summary (Last 24 hours) at 01/23/15 1319 Last data filed at 01/23/15 0900  Gross per 24 hour  Intake    720 ml  Output    575 ml  Net    145 ml     Physical Exam  Exam Awake Alert, Oriented , No new F.N deficits, Normal affect Golf.AT,PERRAL Supple Neck,No JVD, No cervical lymphadenopathy appriciated.  Symmetrical Chest wall movement, Good air movement bilaterally,  RRR,No Gallops,Rubs or new Murmurs, No Parasternal Heave +ve B.Sounds, Abd Soft, Non tender, No organomegaly appriciated, No rebound -guarding or rigidity. No Cyanosis, Clubbing or edema, No new Rash or bruise   Data Review   Micro Results Recent Results (from the past 240 hour(s))  Urine culture     Status: None (Preliminary result)   Collection Time: 01/21/15  1:03 PM  Result Value Ref Range Status   Specimen Description URINE, CATHETERIZED  Final   Special Requests NONE  Final   Colony Count   Final    >=100,000 COLONIES/ML Performed at [facility]    Culture   Final    ESCHERICHIA COLI Performed at [facility]    Report Status PENDING   Incomplete  Culture, sputum-assessment     Status: None   Collection Time: 01/21/15  3:40 PM  Result Value Ref Range Status   Specimen Description SPUTUM  Final   Special Requests NONE  Final   Sputum evaluation   Final    MICROSCOPIC FINDINGS SUGGEST THAT THIS SPECIMEN IS NOT REPRESENTATIVE OF LOWER RESPIRATORY SECRETIONS. PLEASE RECOLLECT. INFORMED WILLIAM @1607  ON 4.26.16 BY MCCOY,N.    Report Status  01/21/2015 FINAL  Final    Radiology Reports Dg Chest 2 View  01/21/2015   CLINICAL DATA:  Two day history of hallucinations. History of colon carcinoma  EXAM: CHEST  2 VIEW  COMPARISON:  June 17, 2014  FINDINGS: There is focal infiltrate in the right base. Lungs elsewhere clear. Heart is upper normal in size with pulmonary vascularity within normal limits. There is atherosclerotic change in the aorta. No adenopathy. There is anterior wedging of the T12 vertebral body.  IMPRESSION: Right base infiltrate.   Electronically Signed   By: Lowella Grip III M.D.   On: 01/21/2015 13:48    CBC  Recent Labs Lab 01/21/15 1237 01/22/15 0610 01/23/15 0537  WBC 9.4 8.2 8.0  HGB 12.5 11.5* 10.1*  HCT 39.3 36.9 34.7*  PLT 423* 321 297  MCV 89.3 91.1 92.0  MCH 28.4 28.4 26.8  MCHC 31.8 31.2 29.1*  RDW 14.8 14.9 15.1  LYMPHSABS 2.2  --   --   MONOABS 0.6  --   --   EOSABS 0.1  --   --   BASOSABS 0.0  --   --     Chemistries   Recent Labs Lab 01/21/15 1237 01/22/15 0610 01/23/15 0537  NA 139 137 137  K 3.1* 3.7 3.9  CL 100 104 103  CO2 28 26 25   GLUCOSE 163* 134* 135*  BUN 22 25* 38*  CREATININE 1.12* 1.31* 2.10*  CALCIUM 8.5 8.0* 8.5  AST 21  --   --   ALT 11  --   --   ALKPHOS 64  --   --   BILITOT 0.6  --   --    ------------------------------------------------------------------------------------------------------------------ estimated creatinine clearance is 16.4 mL/min (by C-G formula based on Cr of  2.1). ------------------------------------------------------------------------------------------------------------------ No results for input(s): HGBA1C in the last 72 hours. ------------------------------------------------------------------------------------------------------------------ No results for input(s): CHOL, HDL, LDLCALC, TRIG, CHOLHDL, LDLDIRECT in the last 72 hours. ------------------------------------------------------------------------------------------------------------------ No results for input(s): TSH, T4TOTAL, T3FREE, THYROIDAB in the last 72 hours.  Invalid input(s): FREET3 ------------------------------------------------------------------------------------------------------------------ No results for input(s): VITAMINB12, FOLATE, FERRITIN, TIBC, IRON, RETICCTPCT in the last 72 hours.  Coagulation profile No results for input(s): INR, PROTIME in the last 168 hours.  No results for input(s): DDIMER in the last 72 hours.  Cardiac Enzymes  Recent Labs Lab 01/21/15 1237  TROPONINI 0.03   ------------------------------------------------------------------------------------------------------------------ Invalid input(s): POCBNP     Time Spent in minutes   30 minutes   Ronnett Pullin M.D on 01/23/2015 at 1:19 PM  Between 7am to 7pm - Pager - [phone]  After 7pm go to [email or web address] - password [email or web address]  And look for the night coverage person covering for me after hours  [facility] Office  [phone]   **Disclaimer: This note may have been dictated with voice recognition software. Similar sounding words can inadvertently be transcribed and this note may contain transcription errors which may not have been corrected upon publication of note.**

## 2015-01-24 LAB — BASIC METABOLIC PANEL
Anion gap: 6 (ref 5–15)
BUN: 41 mg/dL — ABNORMAL HIGH (ref 6–23)
CO2: 23 mmol/L (ref 19–32)
CREATININE: 1.7 mg/dL — AB (ref 0.50–1.10)
Calcium: 8 mg/dL — ABNORMAL LOW (ref 8.4–10.5)
Chloride: 108 mmol/L (ref 96–112)
GFR calc Af Amer: 31 mL/min — ABNORMAL LOW (ref >90)
GFR calc non Af Amer: 27 mL/min — ABNORMAL LOW (ref >90)
GLUCOSE: 150 mg/dL — AB (ref 70–99)
POTASSIUM: 4.2 mmol/L (ref 3.5–5.1)
SODIUM: 137 mmol/L (ref 135–145)

## 2015-01-24 LAB — CBC
HEMATOCRIT: 33.5 % — AB (ref 36.0–46.0)
Hemoglobin: 10.2 g/dL — ABNORMAL LOW (ref 12.0–15.0)
MCH: 28 pg (ref 26.0–34.0)
MCHC: 30.4 g/dL (ref 30.0–36.0)
MCV: 92 fL (ref 78.0–100.0)
Platelets: 283 10*3/uL (ref 150–400)
RBC: 3.64 MIL/uL — AB (ref 3.87–5.11)
RDW: 15.3 % (ref 11.5–15.5)
WBC: 7 10*3/uL (ref 4.0–10.5)

## 2015-01-24 MED ORDER — NYSTATIN 100000 UNIT/GM EX POWD
Freq: Three times a day (TID) | CUTANEOUS | Status: DC
  Administered 2015-01-24 – 2015-01-26 (×6): via TOPICAL
  Filled 2015-01-24: qty 15

## 2015-01-24 MED ORDER — TRAMADOL HCL 50 MG PO TABS
25.0000 mg | ORAL_TABLET | Freq: Four times a day (QID) | ORAL | Status: DC | PRN
  Administered 2015-01-24 – 2015-01-25 (×3): 25 mg via ORAL
  Filled 2015-01-24 (×4): qty 1

## 2015-01-24 MED ORDER — FLUCONAZOLE 150 MG PO TABS
150.0000 mg | ORAL_TABLET | Freq: Once | ORAL | Status: AC
  Administered 2015-01-24: 150 mg via ORAL
  Filled 2015-01-24: qty 1

## 2015-01-24 NOTE — Progress Notes (Signed)
Patient Demographics  [name], is a 81 y.o. female, DOB - [birth year], OMV:[id number]  Admit date - 01/21/2015   Admitting Physician Costin Karlyne Greenspan, MD  Outpatient Primary MD for the patient is Alvester Chou, NP  LOS - 3   Chief Complaint  Patient presents with  . Hallucinations      History of present illness/brief narrative:  [name] is a 81 y.o. female has a past medical history significant for COPD, diastolic CHF, recurrent UTIs, PAF, presents to the hospital brought by her daughter with worsening mental status and intermittent hallucinations in the past 3 days. Per family, this happens usually when she gets a UTI. She denies any fevers or chills. She endorses dyspnea for 1-2 days as well as one episode of chest pain yesterday which lasted about 5 minutes. She denies abdominal pain, has had nausea and one time episode of emesis.  chest x-ray shows potential left-sided pneumonia and a urinalysis has evidence of urinary tract infection.   Subjective:   [name] today has, No headache, No chest pain, No abdominal pain - No Nausea, No new weakness tingling or numbness, reports cough much improved.  Assessment & Plan    Principal Problem:   CAP (community acquired pneumonia) Active Problems:   Acute respiratory failure   Paroxysmal atrial fibrillation   Adenocarcinoma, colon   UTI (lower urinary tract infection)   Hypertension  CAP  - On ceftriaxone and azithromycin 4/26 day 4 out of 5. - Sputum culture not representative of lower respiratory secretions - Strep pneumonia and legionella are negative  Urinary tract infection with chronic indwelling Foley catheter . - Patient without known history of multidrug resistant organisms, in the past she's grown Escherichia coli which is sensitive to ceftriaxone.  - Continue with Rocephin 4/26  urine culture growing Escherichia coli,sensitivity still  pending . -Change Foley catheter 4/28   Acute renal failure  -  patient has elevated BUN level, so this is most likely prerenal, ruling on IV fluids, and is 1.7 today.  Acute hypoxemic respiratory failure - Patient with intermittent dyspnea at home, requiring oxygen - Wean off oxygen as tolerated  Paroxysmal A. Fib - patient currently with regular rhythm, sinus - She denies taking her Eliquis, this has been stopped as an outpatient a while ago - Continue aspirin   Adenocarcinoma of the colon - Status post surgery, this is followed as an outpatient  Dementia  - Acutely exacerbated by the presence of urinary tract infection as well as community-acquired pneumonia  Genital candidiasis - Received 1 dose of Diflucan, continue with nystatin powder.   Code Status: DO NOT RESUSCITATE  Family Communication: Cost with the daughter over the phone 4/28, 4/29  Disposition Plan: For skilled nursing facility in 24-48 hour   Procedures  None   Consults   None   Medications  Scheduled Meds: . antiseptic oral rinse  7 mL Mouth Rinse q12n4p  . aspirin EC  81 mg Oral Daily  . azithromycin  500 mg Oral Daily  . cefTRIAXone (ROCEPHIN)  IV  1 g Intravenous Q24H  . chlorhexidine  15 mL Mouth Rinse BID  . gabapentin  200 mg Oral TID  . heparin subcutaneous  5,000 Units Subcutaneous 3  times per day  . metoprolol tartrate  12.5 mg Oral BID  . nortriptyline  50 mg Oral QHS  . QUEtiapine  25 mg Oral QHS   Continuous Infusions: . sodium chloride 100 mL/hr at 01/23/15 2142   PRN Meds:.acetaminophen  DVT Prophylaxis  - Heparin -   Lab Results  Component Value Date   PLT 283 01/24/2015    Antibiotics    Anti-infectives    Start     Dose/Rate Route Frequency Ordered Stop   01/22/15 1000  azithromycin (ZITHROMAX) tablet 500 mg     500 mg Oral Daily 01/21/15 1439 01/29/15 0959   01/22/15 0600  cefTRIAXone (ROCEPHIN) 1 g in  dextrose 5 % 50 mL IVPB     1 g 100 mL/hr over 30 Minutes Intravenous Every 24 hours 01/21/15 1439 01/29/15 0559   01/21/15 1415  azithromycin (ZITHROMAX) 500 mg in dextrose 5 % 250 mL IVPB  Status:  Discontinued     500 mg 250 mL/hr over 60 Minutes Intravenous Every 24 hours 01/21/15 1402 01/22/15 1032   01/21/15 1415  cefTRIAXone (ROCEPHIN) 1 g in dextrose 5 % 50 mL IVPB  Status:  Discontinued     1 g 100 mL/hr over 30 Minutes Intravenous Every 24 hours 01/21/15 1402 01/22/15 1033          Objective:   Filed Vitals:   01/23/15 1311 01/23/15 2109 01/24/15 0540 01/24/15 0953  BP: 105/59 129/48 111/55   Pulse: 71 64 55 55  Temp: 98.5 F (36.9 C) 98.4 F (36.9 C) 98.7 F (37.1 C)   TempSrc: Oral Oral Oral   Resp: 18 20 18    Height:      Weight:      SpO2: 97%  99%     Wt Readings from Last 3 Encounters:  01/21/15 64.2 kg (141 lb 8.6 oz)  06/21/14 71.079 kg (156 lb 11.2 oz)  10/09/13 65.182 kg (143 lb 11.2 oz)     Intake/Output Summary (Last 24 hours) at 01/24/15 1539 Last data filed at 01/24/15 0849  Gross per 24 hour  Intake   2323 ml  Output    550 ml  Net   1773 ml     Physical Exam  Exam Awake Alert, Oriented , No new F.N deficits, Normal affect [place].AT,PERRAL Supple Neck,No JVD, No cervical lymphadenopathy appriciated.  Symmetrical Chest wall movement, Good air movement bilaterally,  RRR,No Gallops,Rubs or new Murmurs, No Parasternal Heave +ve B.Sounds, Abd Soft, Non tender, No organomegaly appriciated, No rebound -guarding or rigidity. No Cyanosis, Clubbing or edema, No new Rash or bruise   Data Review   Micro Results Recent Results (from the past 240 hour(s))  Urine culture     Status: None (Preliminary result)   Collection Time: 01/21/15  1:03 PM  Result Value Ref Range Status   Specimen Description URINE, CATHETERIZED  Final   Special Requests NONE  Final   Colony Count   Final    >=100,000 COLONIES/ML Performed at [facility]     Culture   Final    ESCHERICHIA COLI Performed at [facility]    Report Status PENDING  Incomplete  Culture, sputum-assessment     Status: None   Collection Time: 01/21/15  3:40 PM  Result Value Ref Range Status   Specimen Description SPUTUM  Final   Special Requests NONE  Final   Sputum evaluation   Final    MICROSCOPIC FINDINGS SUGGEST THAT THIS SPECIMEN IS NOT REPRESENTATIVE OF  LOWER RESPIRATORY SECRETIONS. PLEASE RECOLLECT. INFORMED WILLIAM @1607  ON 4.26.16 BY MCCOY,N.    Report Status 01/21/2015 FINAL  Final    Radiology Reports No results found.  CBC  Recent Labs Lab 01/21/15 1237 01/22/15 0610 01/23/15 0537 01/24/15 0700  WBC 9.4 8.2 8.0 7.0  HGB 12.5 11.5* 10.1* 10.2*  HCT 39.3 36.9 34.7* 33.5*  PLT 423* 321 297 283  MCV 89.3 91.1 92.0 92.0  MCH 28.4 28.4 26.8 28.0  MCHC 31.8 31.2 29.1* 30.4  RDW 14.8 14.9 15.1 15.3  LYMPHSABS 2.2  --   --   --   MONOABS 0.6  --   --   --   EOSABS 0.1  --   --   --   BASOSABS 0.0  --   --   --     Chemistries   Recent Labs Lab 01/21/15 1237 01/22/15 0610 01/23/15 0537 01/24/15 0700  NA 139 137 137 137  K 3.1* 3.7 3.9 4.2  CL 100 104 103 108  CO2 28 26 25 23   GLUCOSE 163* 134* 135* 150*  BUN 22 25* 38* 41*  CREATININE 1.12* 1.31* 2.10* 1.70*  CALCIUM 8.5 8.0* 8.5 8.0*  AST 21  --   --   --   ALT 11  --   --   --   ALKPHOS 64  --   --   --   BILITOT 0.6  --   --   --    ------------------------------------------------------------------------------------------------------------------ estimated creatinine clearance is 20.2 mL/min (by C-G formula based on Cr of 1.7). ------------------------------------------------------------------------------------------------------------------ No results for input(s): HGBA1C in the last 72 hours. ------------------------------------------------------------------------------------------------------------------ No results for input(s): CHOL, HDL, LDLCALC, TRIG,  CHOLHDL, LDLDIRECT in the last 72 hours. ------------------------------------------------------------------------------------------------------------------ No results for input(s): TSH, T4TOTAL, T3FREE, THYROIDAB in the last 72 hours.  Invalid input(s): FREET3 ------------------------------------------------------------------------------------------------------------------ No results for input(s): VITAMINB12, FOLATE, FERRITIN, TIBC, IRON, RETICCTPCT in the last 72 hours.  Coagulation profile No results for input(s): INR, PROTIME in the last 168 hours.  No results for input(s): DDIMER in the last 72 hours.  Cardiac Enzymes  Recent Labs Lab 01/21/15 1237  TROPONINI 0.03   ------------------------------------------------------------------------------------------------------------------ Invalid input(s): POCBNP     Time Spent in minutes   25 minutes   Derrius Furtick M.D on 01/24/2015 at 3:39 PM  Between 7am to 7pm - Pager - [phone]  After 7pm go to [email or web address] - password [email or web address]  And look for the night coverage person covering for me after hours  [facility] Office  [phone]   **Disclaimer: This note may have been dictated with voice recognition software. Similar sounding words can inadvertently be transcribed and this note may contain transcription errors which may not have been corrected upon publication of note.**

## 2015-01-25 ENCOUNTER — Encounter (HOSPITAL_COMMUNITY): Payer: Self-pay | Admitting: *Deleted

## 2015-01-25 LAB — BASIC METABOLIC PANEL
ANION GAP: 5 (ref 5–15)
BUN: 34 mg/dL — ABNORMAL HIGH (ref 6–23)
CHLORIDE: 112 mmol/L (ref 96–112)
CO2: 22 mmol/L (ref 19–32)
Calcium: 7.8 mg/dL — ABNORMAL LOW (ref 8.4–10.5)
Creatinine, Ser: 1.44 mg/dL — ABNORMAL HIGH (ref 0.50–1.10)
GFR calc Af Amer: 38 mL/min — ABNORMAL LOW (ref >90)
GFR calc non Af Amer: 33 mL/min — ABNORMAL LOW (ref >90)
GLUCOSE: 132 mg/dL — AB (ref 70–99)
Potassium: 4.2 mmol/L (ref 3.5–5.1)
SODIUM: 139 mmol/L (ref 135–145)

## 2015-01-25 LAB — EXPECTORATED SPUTUM ASSESSMENT W GRAM STAIN, RFLX TO RESP C

## 2015-01-25 LAB — EXPECTORATED SPUTUM ASSESSMENT W REFEX TO RESP CULTURE

## 2015-01-25 LAB — GLUCOSE, CAPILLARY: Glucose-Capillary: 127 mg/dL — ABNORMAL HIGH (ref 70–99)

## 2015-01-25 MED ORDER — ASPIRIN 81 MG PO TBEC
81.0000 mg | DELAYED_RELEASE_TABLET | Freq: Every day | ORAL | Status: DC
Start: 2015-01-25 — End: 2015-09-19

## 2015-01-25 MED ORDER — NYSTATIN 100000 UNIT/GM EX POWD: CUTANEOUS | Status: AC

## 2015-01-25 MED ORDER — GABAPENTIN 100 MG PO CAPS: 200.0000 mg | ORAL_CAPSULE | Freq: Three times a day (TID) | ORAL | Status: DC

## 2015-01-25 MED ORDER — TRAMADOL HCL 50 MG PO TABS: 25.0000 mg | ORAL_TABLET | Freq: Four times a day (QID) | ORAL | Status: DC | PRN

## 2015-01-25 NOTE — Progress Notes (Signed)
Patient Demographics  [name], is a 81 y.o. female, DOB - [birth year], GMW:[id number]  Admit date - 01/21/2015   Admitting Physician Costin Karlyne Greenspan, MD  Outpatient Primary MD for the patient is Alvester Chou, NP  LOS - 4   Chief Complaint  Patient presents with  . Hallucinations      History of present illness/brief narrative:  [name] is a 81 y.o. female has a past medical history significant for COPD, diastolic CHF, recurrent UTIs, PAF, presents to the hospital brought by her daughter with worsening mental status and intermittent hallucinations in the past 3 days. Per family, this happens usually when she gets a UTI. She denies any fevers or chills. She endorses dyspnea for 1-2 days as well as one episode of chest pain yesterday which lasted about 5 minutes. She denies abdominal pain, has had nausea and one time episode of emesis.  chest x-ray shows potential left-sided pneumonia and a urinalysis has evidence of urinary tract infection.   Subjective:   [name] today has, No headache, No chest pain, No abdominal pain - No Nausea, No new weakness tingling or numbness, reports cough much improved.  Assessment & Plan    Principal Problem:   CAP (community acquired pneumonia) Active Problems:   Acute respiratory failure   Paroxysmal atrial fibrillation   Adenocarcinoma, colon   UTI (lower urinary tract infection)   Hypertension  CAP  - On ceftriaxone and azithromycin 4/26 , today is day 5 out of 5. - Sputum culture not representative of lower respiratory secretions - Strep pneumonia and legionella are negative  Urinary tract infection with chronic indwelling Foley catheter . - Patient without known history of multidrug resistant organisms, in the past she's grown Escherichia coli which is sensitive to ceftriaxone.  - Continue with  Rocephin 4/26 urine culture growing Escherichia coli,sensitivity still  pending . -Changed Foley catheter 4/28   Acute renal failure  -  patient has elevated BUN level, so this is most likely prerenal, ruling on IV fluids, and is 1.44 today.  Acute hypoxemic respiratory failure - Patient with intermittent dyspnea at home, requiring oxygen - Wean off oxygen as tolerated  Paroxysmal A. Fib - patient currently with regular rhythm, sinus - She denies taking her Eliquis, this has been stopped as an outpatient a while ago - Continue aspirin   Adenocarcinoma of the colon - Status post surgery, this is followed as an outpatient  Dementia  - Acutely exacerbated by the presence of urinary tract infection as well as community-acquired pneumonia  Genital candidiasis - Received 1 dose of Diflucan, continue with nystatin powder.   Code Status: DO NOT RESUSCITATE  Family Communication: Daughter at bedside  Disposition Plan: For skilled nursing facility in am.   Procedures  None   Consults   None   Medications  Scheduled Meds: . antiseptic oral rinse  7 mL Mouth Rinse q12n4p  . aspirin EC  81 mg Oral Daily  . azithromycin  500 mg Oral Daily  . cefTRIAXone (ROCEPHIN)  IV  1 g Intravenous Q24H  . chlorhexidine  15 mL Mouth Rinse BID  . gabapentin  200 mg Oral TID  . heparin subcutaneous  5,000 Units Subcutaneous 3 times per day  .  metoprolol tartrate  12.5 mg Oral BID  . nortriptyline  50 mg Oral QHS  . nystatin   Topical TID  . QUEtiapine  25 mg Oral QHS   Continuous Infusions: . sodium chloride 75 mL/hr at 01/25/15 1137   PRN Meds:.acetaminophen, traMADol  DVT Prophylaxis  - Heparin -   Lab Results  Component Value Date   PLT 283 01/24/2015    Antibiotics    Anti-infectives    Start     Dose/Rate Route Frequency Ordered Stop   01/24/15 1800  fluconazole (DIFLUCAN) tablet 150 mg     150 mg Oral  Once 01/24/15 1555 01/24/15 1805   01/22/15 1000  azithromycin  (ZITHROMAX) tablet 500 mg     500 mg Oral Daily 01/21/15 1439 01/29/15 0959   01/22/15 0600  cefTRIAXone (ROCEPHIN) 1 g in dextrose 5 % 50 mL IVPB     1 g 100 mL/hr over 30 Minutes Intravenous Every 24 hours 01/21/15 1439 01/29/15 0559   01/21/15 1415  azithromycin (ZITHROMAX) 500 mg in dextrose 5 % 250 mL IVPB  Status:  Discontinued     500 mg 250 mL/hr over 60 Minutes Intravenous Every 24 hours 01/21/15 1402 01/22/15 1032   01/21/15 1415  cefTRIAXone (ROCEPHIN) 1 g in dextrose 5 % 50 mL IVPB  Status:  Discontinued     1 g 100 mL/hr over 30 Minutes Intravenous Every 24 hours 01/21/15 1402 01/22/15 1033          Objective:   Filed Vitals:   01/24/15 2136 01/25/15 0543 01/25/15 0938 01/25/15 1100  BP: 152/41 124/41 136/47   Pulse: 71 57 57   Temp: 98.6 F (37 C) 97.6 F (36.4 C)    TempSrc: Oral Oral    Resp: 18 18    Height:      Weight:      SpO2: 100% 100% 97% 94%    Wt Readings from Last 3 Encounters:  01/21/15 64.2 kg (141 lb 8.6 oz)  06/21/14 71.079 kg (156 lb 11.2 oz)  10/09/13 65.182 kg (143 lb 11.2 oz)     Intake/Output Summary (Last 24 hours) at 01/25/15 1318 Last data filed at 01/25/15 1000  Gross per 24 hour  Intake 2663.75 ml  Output    650 ml  Net 2013.75 ml     Physical Exam  Exam Awake Alert, Oriented , No new F.N deficits, Normal affect [name].AT,PERRAL Supple Neck,No JVD, No cervical lymphadenopathy appriciated.  Symmetrical Chest wall movement, Good air movement bilaterally,  RRR,No Gallops,Rubs or new Murmurs, No Parasternal Heave +ve B.Sounds, Abd Soft, Non tender, No organomegaly appriciated, No rebound -guarding or rigidity. No Cyanosis, Clubbing or edema, No new Rash or bruise   Data Review   Micro Results Recent Results (from the past 240 hour(s))  Urine culture     Status: None (Preliminary result)   Collection Time: 01/21/15  1:03 PM  Result Value Ref Range Status   Specimen Description URINE, CATHETERIZED  Final   Special  Requests NONE  Final   Colony Count   Final    >=100,000 COLONIES/ML Performed at [facility]    Culture   Final    ESCHERICHIA COLI Performed at [facility]    Report Status PENDING  Incomplete  Culture, sputum-assessment     Status: None   Collection Time: 01/21/15  3:40 PM  Result Value Ref Range Status   Specimen Description SPUTUM  Final   Special Requests NONE  Final  Sputum evaluation   Final    MICROSCOPIC FINDINGS SUGGEST THAT THIS SPECIMEN IS NOT REPRESENTATIVE OF LOWER RESPIRATORY SECRETIONS. PLEASE RECOLLECT. INFORMED WILLIAM @1607  ON 4.26.16 BY MCCOY,N.    Report Status 01/21/2015 FINAL  Final    Radiology Reports No results found.  CBC  Recent Labs Lab 01/21/15 1237 01/22/15 0610 01/23/15 0537 01/24/15 0700  WBC 9.4 8.2 8.0 7.0  HGB 12.5 11.5* 10.1* 10.2*  HCT 39.3 36.9 34.7* 33.5*  PLT 423* 321 297 283  MCV 89.3 91.1 92.0 92.0  MCH 28.4 28.4 26.8 28.0  MCHC 31.8 31.2 29.1* 30.4  RDW 14.8 14.9 15.1 15.3  LYMPHSABS 2.2  --   --   --   MONOABS 0.6  --   --   --   EOSABS 0.1  --   --   --   BASOSABS 0.0  --   --   --     Chemistries   Recent Labs Lab 01/21/15 1237 01/22/15 0610 01/23/15 0537 01/24/15 0700 01/25/15 0615  NA 139 137 137 137 139  K 3.1* 3.7 3.9 4.2 4.2  CL 100 104 103 108 112  CO2 28 26 25 23 22   GLUCOSE 163* 134* 135* 150* 132*  BUN 22 25* 38* 41* 34*  CREATININE 1.12* 1.31* 2.10* 1.70* 1.44*  CALCIUM 8.5 8.0* 8.5 8.0* 7.8*  AST 21  --   --   --   --   ALT 11  --   --   --   --   ALKPHOS 64  --   --   --   --   BILITOT 0.6  --   --   --   --    ------------------------------------------------------------------------------------------------------------------ estimated creatinine clearance is 23.9 mL/min (by C-G formula based on Cr of 1.44). ------------------------------------------------------------------------------------------------------------------ No results for input(s): HGBA1C in the last 72  hours. ------------------------------------------------------------------------------------------------------------------ No results for input(s): CHOL, HDL, LDLCALC, TRIG, CHOLHDL, LDLDIRECT in the last 72 hours. ------------------------------------------------------------------------------------------------------------------ No results for input(s): TSH, T4TOTAL, T3FREE, THYROIDAB in the last 72 hours.  Invalid input(s): FREET3 ------------------------------------------------------------------------------------------------------------------ No results for input(s): VITAMINB12, FOLATE, FERRITIN, TIBC, IRON, RETICCTPCT in the last 72 hours.  Coagulation profile No results for input(s): INR, PROTIME in the last 168 hours.  No results for input(s): DDIMER in the last 72 hours.  Cardiac Enzymes  Recent Labs Lab 01/21/15 1237  TROPONINI 0.03   ------------------------------------------------------------------------------------------------------------------ Invalid input(s): POCBNP     Time Spent in minutes   20 minutes   Carolee Channell M.D on 01/25/2015 at 1:18 PM  Between 7am to 7pm - Pager - [phone]  After 7pm go to [email or web address] - password [email or web address]  And look for the night coverage person covering for me after hours  [facility] Office  [phone]   **Disclaimer: This note may have been dictated with voice recognition software. Similar sounding words can inadvertently be transcribed and this note may contain transcription errors which may not have been corrected upon publication of note.**

## 2015-01-25 NOTE — Clinical Social Work Note (Signed)
CSW reviewed handoff which reflected that pt would be discharging today and would need a SNF.  No information was sent to any facilities so CSW prepared FL2 and sent clinicals to facilities through [facility] system  CSW spoke with pt's daughter who stated that she prefers [facility] in [place] called and spoke with Tammy at [facility] who stated that she does not have a bed today but would have one tomorrow.  [facility] could accommodate this weekend admission tomorrow IF discharge medications are sent to them today before noon  CSW will send MD message and continue to look for weekend placement  .Dede Query, LCSW [facility] Clinical Social Worker - Weekend Coverage cell #: [phone]

## 2015-01-26 NOTE — Discharge Instructions (Signed)
Follow with Primary MD Alvester Chou, NP in 7 days   Get CBC, CMP, 2 view Chest X ray checked  by Primary MD next visit.    Activity: As tolerated with Full fall precautions use walker/cane & assistance as needed   Disposition SNF   Diet: Carbohydrate modified diet , with feeding assistance and aspiration precautions.  For Heart failure patients - Check your Weight same time everyday, if you gain over 2 pounds, or you develop in leg swelling, experience more shortness of breath or chest pain, call your Primary MD immediately. Follow Cardiac Low Salt Diet and 1.5 lit/day fluid restriction.   On your next visit with your primary care physician please Get Medicines reviewed and adjusted.   Please request your Prim.MD to go over all Hospital Tests and Procedure/Radiological results at the follow up, please get all Hospital records sent to your Prim MD by signing hospital release before you go home.   If you experience worsening of your admission symptoms, develop shortness of breath, life threatening emergency, suicidal or homicidal thoughts you must seek medical attention immediately by calling 911 or calling your MD immediately  if symptoms less severe.  You Must read complete instructions/literature along with all the possible adverse reactions/side effects for all the Medicines you take and that have been prescribed to you. Take any new Medicines after you have completely understood and accpet all the possible adverse reactions/side effects.   Do not drive, operating heavy machinery, perform activities at heights, swimming or participation in water activities or provide baby sitting services if your were admitted for syncope or siezures until you have seen by Primary MD or a Neurologist and advised to do so again.  Do not drive when taking Pain medications.    Do not take more than prescribed Pain, Sleep and Anxiety Medications  Special Instructions: If you have smoked or chewed  Tobacco  in the last 2 yrs please stop smoking, stop any regular Alcohol  and or any Recreational drug use.  Wear Seat belts while driving.   Please note  You were cared for by a hospitalist during your hospital stay. If you have any questions about your discharge medications or the care you received while you were in the hospital after you are discharged, you can call the unit and asked to speak with the hospitalist on call if the hospitalist that took care of you is not available. Once you are discharged, your primary care physician will handle any further medical issues. Please note that NO REFILLS for any discharge medications will be authorized once you are discharged, as it is imperative that you return to your primary care physician (or establish a relationship with a primary care physician if you do not have one) for your aftercare needs so that they can reassess your need for medications and monitor your lab values.

## 2015-01-26 NOTE — Clinical Social Work Placement (Signed)
   CLINICAL SOCIAL WORK PLACEMENT  NOTE  Date:  01/26/2015  Patient Details  Name: [name] MRN: [id number] Date of Birth: 14-Aug-1930  Clinical Social Work is seeking post-discharge placement for this patient at the [place] level of care (*CSW will initial, date and re-position this form in  chart as items are completed):  Yes   Patient/family provided with [place] Work Department's list of facilities offering this level of care within the geographic area requested by the patient (or if unable, by the patient's family).  Yes   Patient/family informed of their freedom to choose among providers that offer the needed level of care, that participate in Medicare, Medicaid or managed care program needed by the patient, have an available bed and are willing to accept the patient.  Yes   Patient/family informed of [facility]'s ownership interest in [facility] and [facility], as well as of the fact that they are under no obligation to receive care at these facilities.  PASRR submitted to EDS on       PASRR number received on       Existing PASRR number confirmed on  (January 25 2015)     FL2 transmitted to all facilities in geographic area requested by pt/family on       FL2 transmitted to all facilities within larger geographic area on  (January 25 2015)     Patient informed that his/her managed care company has contracts with or will negotiate with certain facilities, including the following:        Yes   Patient/family informed of bed offers received.  Patient chooses bed at  [facility])     Physician recommends and patient chooses bed at      Patient to be transferred to   on  (Jan 26, 2015).  Patient to be transferred to facility by ambulance     Patient family notified on 01/26/15 of transfer.  Name of family member notified:  [facility] /daughter     PHYSICIAN       Additional Comment:     _______________________________________________ Carlean Jews, LCSW 01/26/2015, 3:55 PM

## 2015-01-26 NOTE — Progress Notes (Signed)
Patient  discharged to [facility] ([place]) via [place]. Report called to Melbourne Abts, LPN.

## 2015-01-26 NOTE — Discharge Summary (Addendum)
[name], 81 y.o., DOB Feb 08, 1930, MRN [id number]. Admission date: 01/21/2015 Discharge Date 01/26/2015 Primary MD Alvester Chou, NP Admitting Physician Caren Griffins, MD  PCP/SNF please follow on: - Check labs including CBC, BMP during next visit.  Admission Diagnosis  UTI (lower urinary tract infection) [N39.0] CAP (community acquired pneumonia) [J18.9] Chest pain [R07.9]  Discharge Diagnosis   Principal Problem:   CAP (community acquired pneumonia) Active Problems:   Acute respiratory failure   Paroxysmal atrial fibrillation   Adenocarcinoma, colon   UTI (lower urinary tract infection)   Hypertension   Past Medical History  Diagnosis Date  . COPD (chronic obstructive pulmonary disease)   . CHF (congestive heart failure)     ECHO 2011  EF: 60% to 56%, grd 1 distolic dysfxn, 38-75% 6433 ECHO, tech limited by Afib  . Hypertension   . Stroke 2008    rt side deficit  . A-fib   . Back pain   . Constipation   . SBO (small bowel obstruction)     per GI note in 2011, pt denies  . Upper GI bleed 2011    required hospitalization  . Colon polyps   . Tobacco abuse   . Diabetes mellitus     per daughter doctor took patient off DM medication 6 months ago     Past Surgical History  Procedure Laterality Date  . Total abdominal hysterectomy  1980  . Colonoscopy N/A 09/04/2013    Procedure: COLONOSCOPY;  Surgeon: Lear Ng, MD;  Location: [facility] ENDOSCOPY;  Service: Endoscopy;  Laterality: N/A;  . Colon resection N/A 09/06/2013    Procedure: PARTIAL COLECTOMY WITH COLOSTOMY;  Surgeon: Earnstine Regal, MD;  Location: [place];  Service: General;  Laterality: N/A;  . Colostomy revision N/A 09/08/2013    Procedure: COLOSTOMY REVISION;  Surgeon: Gwenyth Ober, MD;  Location: [place];  Service: General;  Laterality: N/A;     Hospital Course See H&P, Labs, Consult and Test reports for all details in brief, patient was admitted for   Principal Problem:   CAP (community acquired  pneumonia) Active Problems:   Acute respiratory failure   Paroxysmal atrial fibrillation   Adenocarcinoma, colon   UTI (lower urinary tract infection)   Hypertension  [name] is a 81 y.o. female has a past medical history significant for COPD, diastolic CHF, recurrent UTIs, PAF, presents to the hospital brought by her daughter with worsening mental status and intermittent hallucinations in the past 3 days. Per family, this happens usually when she gets a UTI. She denies any fevers or chills. She endorses dyspnea for 1-2 days as well as one episode of chest pain yesterday which lasted about 5 minutes. She denies abdominal pain, has had nausea and one time episode of emesis. chest x-ray shows potential left-sided pneumonia and a urinalysis has evidence of urinary tract infection.   CAP  - Treated with total of 5 days of IV Rocephin and azithromycin, no need for further antibiotics as an outpatient. - Sputum culture not representative of lower respiratory secretions - Strep pneumonia and legionella are negative  Urinary tract infection with chronic indwelling Foley catheter . - Patient without known history of multidrug resistant organisms, in the past she's grown Escherichia coli which is sensitive to ceftriaxone.  - Treated with 5 days total of IV Rocephin, Escherichia coli,sensitivity still pending on discharge. -Changed Foley catheter 4/28   Acute renal failure  - patient has elevated BUN level, so this is most likely  prerenal, improved, most recent creatinine is 1.44 on discharge.  Acute hypoxemic respiratory failure - Patient with intermittent dyspnea at home, requiring oxygen - Currently off oxygen  Paroxysmal A. Fib - patient currently with regular rhythm, sinus - She denies taking her Eliquis, this has been stopped as an outpatient a while ago - Continue aspirin on discharge  Adenocarcinoma of the colon - Status post surgery, this is followed as an  outpatient  Dementia  - Acutely exacerbated by the presence of urinary tract infection as well as community-acquired pneumonia  Genital candidiasis - Received 1 dose of Diflucan, continue with nystatin powder.    Consults   None  Significant Tests:  See full reports for all details    Dg Chest 2 View  01/21/2015   CLINICAL DATA:  Two day history of hallucinations. History of colon carcinoma  EXAM: CHEST  2 VIEW  COMPARISON:  June 17, 2014  FINDINGS: There is focal infiltrate in the right base. Lungs elsewhere clear. Heart is upper normal in size with pulmonary vascularity within normal limits. There is atherosclerotic change in the aorta. No adenopathy. There is anterior wedging of the T12 vertebral body.  IMPRESSION: Right base infiltrate.   Electronically Signed   By: Lowella Grip III M.D.   On: 01/21/2015 13:48     Today   Subjective:   [name] today has no headache,no chest abdominal pain,no new weakness tingling or numbness, feels much better .  Objective:   Blood pressure 157/49, pulse 67, temperature 98.2 F (36.8 C), temperature source Oral, resp. rate 18, height 4\' 10"  (1.473 m), weight 64.2 kg (141 lb 8.6 oz), SpO2 92 %.  Intake/Output Summary (Last 24 hours) at 01/26/15 1056 Last data filed at 01/26/15 0622  Gross per 24 hour  Intake 1651.25 ml  Output   1700 ml  Net -48.75 ml    Exam Awake Alert, Oriented , No new F.N deficits, Normal affect .AT,PERRAL Supple Neck,No JVD, No cervical lymphadenopathy appriciated.  Symmetrical Chest wall movement, Good air movement bilaterally,  RRR,No Gallops,Rubs or new Murmurs, No Parasternal Heave +ve B.Sounds, Abd Soft, Non tender, stomy bag present. No organomegaly appriciated, No rebound -guarding or rigidity. No Cyanosis, Clubbing or edema, No new Rash or bruise, right side contracted  Data Review   Cultures - Results for orders placed or performed during the hospital encounter of 01/21/15   Urine culture     Status: None (Preliminary result)   Collection Time: 01/21/15  1:03 PM  Result Value Ref Range Status   Specimen Description URINE, CATHETERIZED  Final   Special Requests NONE  Final   Colony Count   Final    >=100,000 COLONIES/ML Performed at [facility]    Culture   Final    PSEUDOMONAS AERUGINOSA Performed at [facility]    Report Status PENDING  Incomplete  Culture, sputum-assessment     Status: None   Collection Time: 01/21/15  3:40 PM  Result Value Ref Range Status   Specimen Description SPUTUM  Final   Special Requests NONE  Final   Sputum evaluation   Final    MICROSCOPIC FINDINGS SUGGEST THAT THIS SPECIMEN IS NOT REPRESENTATIVE OF LOWER RESPIRATORY SECRETIONS. PLEASE RECOLLECT. INFORMED WILLIAM @1607  ON 4.26.16 BY MCCOY,N.    Report Status 01/21/2015 FINAL  Final  Culture, expectorated sputum-assessment     Status: None   Collection Time: 01/25/15  7:42 PM  Result Value Ref Range Status   Specimen Description SPUTUM  Final   Special Requests NONE  Final   Sputum evaluation   Final    THIS SPECIMEN IS ACCEPTABLE. RESPIRATORY CULTURE REPORT TO FOLLOW.   Report Status 01/25/2015 FINAL  Final     CBC w Diff:  Lab Results  Component Value Date   WBC 7.0 01/24/2015   HGB 10.2* 01/24/2015   HCT 33.5* 01/24/2015   PLT 283 01/24/2015   LYMPHOPCT 24 01/21/2015   MONOPCT 7 01/21/2015   EOSPCT 1 01/21/2015   BASOPCT 0 01/21/2015   CMP:  Lab Results  Component Value Date   NA 139 01/25/2015   K 4.2 01/25/2015   CL 112 01/25/2015   CO2 22 01/25/2015   BUN 34* 01/25/2015   CREATININE 1.44* 01/25/2015   PROT 7.3 01/21/2015   ALBUMIN 3.3* 01/21/2015   BILITOT 0.6 01/21/2015   ALKPHOS 64 01/21/2015   AST 21 01/21/2015   ALT 11 01/21/2015  .  Micro Results Recent Results (from the past 240 hour(s))  Urine culture     Status: None (Preliminary result)   Collection Time: 01/21/15  1:03 PM  Result Value Ref Range Status    Specimen Description URINE, CATHETERIZED  Final   Special Requests NONE  Final   Colony Count   Final    >=100,000 COLONIES/ML Performed at [facility]    Culture   Final    PSEUDOMONAS AERUGINOSA Performed at [facility]    Report Status PENDING  Incomplete  Culture, sputum-assessment     Status: None   Collection Time: 01/21/15  3:40 PM  Result Value Ref Range Status   Specimen Description SPUTUM  Final   Special Requests NONE  Final   Sputum evaluation   Final    MICROSCOPIC FINDINGS SUGGEST THAT THIS SPECIMEN IS NOT REPRESENTATIVE OF LOWER RESPIRATORY SECRETIONS. PLEASE RECOLLECT. INFORMED WILLIAM @1607  ON 4.26.16 BY MCCOY,N.    Report Status 01/21/2015 FINAL  Final  Culture, expectorated sputum-assessment     Status: None   Collection Time: 01/25/15  7:42 PM  Result Value Ref Range Status   Specimen Description SPUTUM  Final   Special Requests NONE  Final   Sputum evaluation   Final    THIS SPECIMEN IS ACCEPTABLE. RESPIRATORY CULTURE REPORT TO FOLLOW.   Report Status 01/25/2015 FINAL  Final     Discharge Instructions          Follow-up Information    Call Alvester Chou, NP.   Specialty:  Nurse Practitioner   Why:  Posthospitalization follow-up, after discharge from SNF   Contact information:   [address] [facility] [place] [address] [place] [address] [phone]       Discharge Medications     Medication List    STOP taking these medications        apixaban 5 MG Tabs tablet  Commonly known as:  ELIQUIS     cefUROXime 250 MG tablet  Commonly known as:  CEFTIN     doxycycline 100 MG tablet  Commonly known as:  VIBRA-TABS     lisinopril 5 MG tablet  Commonly known as:  PRINIVIL,ZESTRIL     nortriptyline 75 MG capsule  Commonly known as:  PAMELOR      TAKE these medications        acetaminophen 325 MG tablet  Commonly known as:  TYLENOL  Take 2 tablets (650 mg total) by mouth every 6 (six) hours as needed  for mild pain, moderate pain or fever (  or Fever >/= 101).     aspirin 81 MG EC tablet  Take 1 tablet (81 mg total) by mouth daily.     gabapentin 100 MG capsule  Commonly known as:  NEURONTIN  Take 2 capsules (200 mg total) by mouth 3 (three) times daily.     metoprolol tartrate 12.5 mg Tabs tablet  Commonly known as:  LOPRESSOR  Take 0.5 tablets (12.5 mg total) by mouth 2 (two) times daily.     nitroGLYCERIN 0.4 MG SL tablet  Commonly known as:  NITROSTAT  Place 0.4 mg under the tongue every 5 (five) minutes as needed for chest pain.     nystatin 100000 UNIT/GM Powd  Please apply 3 times daily to groin area.     prochlorperazine 10 MG tablet  Commonly known as:  COMPAZINE  Take 10 mg by mouth every 8 (eight) hours as needed for nausea or vomiting.     QUEtiapine 25 MG tablet  Commonly known as:  SEROQUEL  Take 1 tablet by mouth at bedtime.     traMADol 50 MG tablet  Commonly known as:  ULTRAM  Take 0.5 tablets (25 mg total) by mouth every 6 (six) hours as needed. pain         Total Time in preparing paper work, data evaluation and todays exam - 35 minutes  Brinkley Peet M.D on 01/26/2015 at 10:56 AM  [facility] Office  [phone]

## 2015-01-27 LAB — URINE CULTURE

## 2015-01-28 LAB — CULTURE, RESPIRATORY W GRAM STAIN

## 2015-01-28 LAB — CULTURE, RESPIRATORY: CULTURE: NORMAL

## 2015-02-16 ENCOUNTER — Emergency Department (HOSPITAL_COMMUNITY): Payer: Medicare Other

## 2015-02-16 ENCOUNTER — Inpatient Hospital Stay (HOSPITAL_COMMUNITY)
Admission: EM | Admit: 2015-02-16 | Discharge: 2015-02-21 | DRG: 689 | Disposition: A | Discharge status: Home Health | Payer: Medicare Other | Attending: Internal Medicine | Admitting: Internal Medicine

## 2015-02-16 ENCOUNTER — Encounter (HOSPITAL_COMMUNITY): Payer: Self-pay

## 2015-02-16 DIAGNOSIS — F0390 Unspecified dementia without behavioral disturbance: Secondary | ICD-10-CM | POA: Diagnosis present

## 2015-02-16 DIAGNOSIS — I5032 Chronic diastolic (congestive) heart failure: Secondary | ICD-10-CM | POA: Diagnosis present

## 2015-02-16 DIAGNOSIS — E869 Volume depletion, unspecified: Secondary | ICD-10-CM | POA: Diagnosis present

## 2015-02-16 DIAGNOSIS — N390 Urinary tract infection, site not specified: Secondary | ICD-10-CM | POA: Diagnosis present

## 2015-02-16 DIAGNOSIS — J449 Chronic obstructive pulmonary disease, unspecified: Secondary | ICD-10-CM | POA: Diagnosis present

## 2015-02-16 DIAGNOSIS — E1165 Type 2 diabetes mellitus with hyperglycemia: Secondary | ICD-10-CM | POA: Diagnosis present

## 2015-02-16 DIAGNOSIS — Z8249 Family history of ischemic heart disease and other diseases of the circulatory system: Secondary | ICD-10-CM

## 2015-02-16 DIAGNOSIS — Z87440 Personal history of urinary (tract) infections: Secondary | ICD-10-CM

## 2015-02-16 DIAGNOSIS — Z720 Tobacco use: Secondary | ICD-10-CM | POA: Diagnosis present

## 2015-02-16 DIAGNOSIS — I69351 Hemiplegia and hemiparesis following cerebral infarction affecting right dominant side: Secondary | ICD-10-CM | POA: Diagnosis not present

## 2015-02-16 DIAGNOSIS — D649 Anemia, unspecified: Secondary | ICD-10-CM | POA: Diagnosis present

## 2015-02-16 DIAGNOSIS — Z7982 Long term (current) use of aspirin: Secondary | ICD-10-CM

## 2015-02-16 DIAGNOSIS — F17210 Nicotine dependence, cigarettes, uncomplicated: Secondary | ICD-10-CM | POA: Diagnosis present

## 2015-02-16 DIAGNOSIS — G9340 Encephalopathy, unspecified: Secondary | ICD-10-CM | POA: Diagnosis present

## 2015-02-16 DIAGNOSIS — E876 Hypokalemia: Secondary | ICD-10-CM | POA: Diagnosis not present

## 2015-02-16 DIAGNOSIS — L309 Dermatitis, unspecified: Secondary | ICD-10-CM | POA: Diagnosis present

## 2015-02-16 DIAGNOSIS — Z86010 Personal history of colonic polyps: Secondary | ICD-10-CM

## 2015-02-16 DIAGNOSIS — I10 Essential (primary) hypertension: Secondary | ICD-10-CM | POA: Diagnosis present

## 2015-02-16 DIAGNOSIS — L89152 Pressure ulcer of sacral region, stage 2: Secondary | ICD-10-CM | POA: Diagnosis present

## 2015-02-16 DIAGNOSIS — Z7401 Bed confinement status: Secondary | ICD-10-CM | POA: Diagnosis not present

## 2015-02-16 DIAGNOSIS — Z9049 Acquired absence of other specified parts of digestive tract: Secondary | ICD-10-CM | POA: Diagnosis present

## 2015-02-16 DIAGNOSIS — I480 Paroxysmal atrial fibrillation: Secondary | ICD-10-CM | POA: Diagnosis present

## 2015-02-16 DIAGNOSIS — B9620 Unspecified Escherichia coli [E. coli] as the cause of diseases classified elsewhere: Secondary | ICD-10-CM | POA: Diagnosis present

## 2015-02-16 DIAGNOSIS — Z79899 Other long term (current) drug therapy: Secondary | ICD-10-CM | POA: Diagnosis not present

## 2015-02-16 DIAGNOSIS — Z90710 Acquired absence of both cervix and uterus: Secondary | ICD-10-CM | POA: Diagnosis not present

## 2015-02-16 DIAGNOSIS — L8990 Pressure ulcer of unspecified site, unspecified stage: Secondary | ICD-10-CM | POA: Insufficient documentation

## 2015-02-16 DIAGNOSIS — Z801 Family history of malignant neoplasm of trachea, bronchus and lung: Secondary | ICD-10-CM

## 2015-02-16 DIAGNOSIS — Z85038 Personal history of other malignant neoplasm of large intestine: Secondary | ICD-10-CM

## 2015-02-16 DIAGNOSIS — Z66 Do not resuscitate: Secondary | ICD-10-CM | POA: Diagnosis present

## 2015-02-16 DIAGNOSIS — M21821 Other specified acquired deformities of right upper arm: Secondary | ICD-10-CM | POA: Diagnosis present

## 2015-02-16 DIAGNOSIS — F0391 Unspecified dementia with behavioral disturbance: Secondary | ICD-10-CM | POA: Diagnosis not present

## 2015-02-16 DIAGNOSIS — R4182 Altered mental status, unspecified: Secondary | ICD-10-CM

## 2015-02-16 DIAGNOSIS — Z8673 Personal history of transient ischemic attack (TIA), and cerebral infarction without residual deficits: Secondary | ICD-10-CM

## 2015-02-16 LAB — CBC WITH DIFFERENTIAL/PLATELET
Basophils Absolute: 0 10*3/uL (ref 0.0–0.1)
Basophils Relative: 0 % (ref 0–1)
EOS PCT: 4 % (ref 0–5)
Eosinophils Absolute: 0.4 10*3/uL (ref 0.0–0.7)
HCT: 37.1 % (ref 36.0–46.0)
Hemoglobin: 11.3 g/dL — ABNORMAL LOW (ref 12.0–15.0)
LYMPHS ABS: 2.3 10*3/uL (ref 0.7–4.0)
Lymphocytes Relative: 24 % (ref 12–46)
MCH: 27.8 pg (ref 26.0–34.0)
MCHC: 30.5 g/dL (ref 30.0–36.0)
MCV: 91.4 fL (ref 78.0–100.0)
MONO ABS: 0.8 10*3/uL (ref 0.1–1.0)
MONOS PCT: 8 % (ref 3–12)
Neutro Abs: 6 10*3/uL (ref 1.7–7.7)
Neutrophils Relative %: 64 % (ref 43–77)
Platelets: 429 10*3/uL — ABNORMAL HIGH (ref 150–400)
RBC: 4.06 MIL/uL (ref 3.87–5.11)
RDW: 15.4 % (ref 11.5–15.5)
WBC: 9.6 10*3/uL (ref 4.0–10.5)

## 2015-02-16 LAB — COMPREHENSIVE METABOLIC PANEL
ALBUMIN: 2.9 g/dL — AB (ref 3.5–5.0)
ALK PHOS: 52 U/L (ref 38–126)
ALT: 9 U/L — ABNORMAL LOW (ref 14–54)
AST: 14 U/L — ABNORMAL LOW (ref 15–41)
Anion gap: 11 (ref 5–15)
BILIRUBIN TOTAL: 0.3 mg/dL (ref 0.3–1.2)
BUN: 24 mg/dL — AB (ref 6–20)
CO2: 27 mmol/L (ref 22–32)
Calcium: 8.9 mg/dL (ref 8.9–10.3)
Chloride: 105 mmol/L (ref 101–111)
Creatinine, Ser: 1.01 mg/dL — ABNORMAL HIGH (ref 0.44–1.00)
GFR calc non Af Amer: 50 mL/min — ABNORMAL LOW (ref >60)
GFR, EST AFRICAN AMERICAN: 58 mL/min — AB (ref >60)
GLUCOSE: 177 mg/dL — AB (ref 65–99)
Potassium: 3.3 mmol/L — ABNORMAL LOW (ref 3.5–5.1)
Sodium: 143 mmol/L (ref 135–145)
Total Protein: 6.7 g/dL (ref 6.5–8.1)

## 2015-02-16 LAB — URINE MICROSCOPIC-ADD ON

## 2015-02-16 LAB — URINALYSIS, ROUTINE W REFLEX MICROSCOPIC
BILIRUBIN URINE: NEGATIVE
Glucose, UA: 100 mg/dL — AB
KETONES UR: NEGATIVE mg/dL
Nitrite: NEGATIVE
PH: 5.5 (ref 5.0–8.0)
Specific Gravity, Urine: 1.021 (ref 1.005–1.030)
Urobilinogen, UA: 0.2 mg/dL (ref 0.0–1.0)

## 2015-02-16 LAB — GLUCOSE, CAPILLARY: GLUCOSE-CAPILLARY: 185 mg/dL — AB (ref 65–99)

## 2015-02-16 LAB — I-STAT CG4 LACTIC ACID, ED: LACTIC ACID, VENOUS: 1.9 mmol/L (ref 0.5–2.0)

## 2015-02-16 MED ORDER — NITROGLYCERIN 0.4 MG SL SUBL: 0.4000 mg | SUBLINGUAL_TABLET | SUBLINGUAL | Status: DC | PRN

## 2015-02-16 MED ORDER — ENOXAPARIN SODIUM 40 MG/0.4ML ~~LOC~~ SOLN
40.0000 mg | SUBCUTANEOUS | Status: DC
  Administered 2015-02-16 – 2015-02-20 (×5): 40 mg via SUBCUTANEOUS
  Filled 2015-02-16 (×5): qty 0.4

## 2015-02-16 MED ORDER — QUETIAPINE FUMARATE 25 MG PO TABS: 25.0000 mg | ORAL_TABLET | Freq: Every day | ORAL | Status: DC

## 2015-02-16 MED ORDER — SODIUM CHLORIDE 0.9 % IJ SOLN
3.0000 mL | Freq: Two times a day (BID) | INTRAMUSCULAR | Status: DC
  Administered 2015-02-17 – 2015-02-21 (×6): 3 mL via INTRAVENOUS

## 2015-02-16 MED ORDER — LORAZEPAM 0.5 MG PO TABS
0.5000 mg | ORAL_TABLET | Freq: Two times a day (BID) | ORAL | Status: DC | PRN
  Administered 2015-02-18 – 2015-02-20 (×3): 0.5 mg via ORAL
  Filled 2015-02-16 (×3): qty 1

## 2015-02-16 MED ORDER — POTASSIUM CHLORIDE IN NACL 40-0.9 MEQ/L-% IV SOLN
INTRAVENOUS | Status: AC
  Administered 2015-02-16: 60 mL/h via INTRAVENOUS
  Filled 2015-02-16: qty 1000

## 2015-02-16 MED ORDER — METOPROLOL TARTRATE 25 MG PO TABS
12.5000 mg | ORAL_TABLET | Freq: Two times a day (BID) | ORAL | Status: DC
  Administered 2015-02-16 – 2015-02-21 (×10): 12.5 mg via ORAL
  Filled 2015-02-16 (×10): qty 1

## 2015-02-16 MED ORDER — ONDANSETRON HCL 4 MG PO TABS: 4.0000 mg | ORAL_TABLET | Freq: Four times a day (QID) | ORAL | Status: DC | PRN

## 2015-02-16 MED ORDER — DEXTROSE 5 % IV SOLN
1.0000 g | Freq: Two times a day (BID) | INTRAVENOUS | Status: DC
  Administered 2015-02-16 – 2015-02-19 (×6): 1 g via INTRAVENOUS
  Filled 2015-02-16 (×6): qty 1

## 2015-02-16 MED ORDER — TRAMADOL HCL 50 MG PO TABS
25.0000 mg | ORAL_TABLET | Freq: Four times a day (QID) | ORAL | Status: DC | PRN
  Administered 2015-02-16 – 2015-02-20 (×6): 25 mg via ORAL
  Filled 2015-02-16 (×7): qty 1

## 2015-02-16 MED ORDER — ONDANSETRON HCL 4 MG/2ML IJ SOLN: 4.0000 mg | Freq: Four times a day (QID) | INTRAMUSCULAR | Status: DC | PRN

## 2015-02-16 MED ORDER — ACETAMINOPHEN 325 MG PO TABS
650.0000 mg | ORAL_TABLET | Freq: Four times a day (QID) | ORAL | Status: DC | PRN
  Administered 2015-02-20: 650 mg via ORAL
  Filled 2015-02-16: qty 2

## 2015-02-16 MED ORDER — SODIUM CHLORIDE 0.9 % IV BOLUS (SEPSIS)
500.0000 mL | Freq: Once | INTRAVENOUS | Status: AC
  Administered 2015-02-16: 500 mL via INTRAVENOUS

## 2015-02-16 MED ORDER — ASPIRIN EC 81 MG PO TBEC
81.0000 mg | DELAYED_RELEASE_TABLET | Freq: Every day | ORAL | Status: DC
  Administered 2015-02-16 – 2015-02-21 (×6): 81 mg via ORAL
  Filled 2015-02-16 (×6): qty 1

## 2015-02-16 MED ORDER — QUETIAPINE FUMARATE 25 MG PO TABS
25.0000 mg | ORAL_TABLET | Freq: Every day | ORAL | Status: DC
  Administered 2015-02-16 – 2015-02-20 (×5): 25 mg via ORAL
  Filled 2015-02-16 (×5): qty 1

## 2015-02-16 MED ORDER — GABAPENTIN 100 MG PO CAPS
200.0000 mg | ORAL_CAPSULE | Freq: Three times a day (TID) | ORAL | Status: DC
  Administered 2015-02-17 – 2015-02-21 (×13): 200 mg via ORAL
  Filled 2015-02-16 (×13): qty 2

## 2015-02-16 MED ORDER — INSULIN ASPART 100 UNIT/ML ~~LOC~~ SOLN
0.0000 [IU] | Freq: Three times a day (TID) | SUBCUTANEOUS | Status: DC
  Administered 2015-02-17: 1 [IU] via SUBCUTANEOUS
  Administered 2015-02-17 (×2): 2 [IU] via SUBCUTANEOUS
  Administered 2015-02-18: 1 [IU] via SUBCUTANEOUS
  Administered 2015-02-18: 2 [IU] via SUBCUTANEOUS
  Administered 2015-02-19: 1 [IU] via SUBCUTANEOUS
  Administered 2015-02-19: 2 [IU] via SUBCUTANEOUS
  Administered 2015-02-19 – 2015-02-20 (×3): 1 [IU] via SUBCUTANEOUS

## 2015-02-16 MED ORDER — ALBUTEROL SULFATE (2.5 MG/3ML) 0.083% IN NEBU: 2.5000 mg | INHALATION_SOLUTION | RESPIRATORY_TRACT | Status: DC | PRN

## 2015-02-16 MED ORDER — POTASSIUM CHLORIDE CRYS ER 20 MEQ PO TBCR
40.0000 meq | EXTENDED_RELEASE_TABLET | Freq: Once | ORAL | Status: AC
Start: 2015-02-16 — End: 2015-02-16
  Administered 2015-02-16: 40 meq via ORAL
  Filled 2015-02-16: qty 2

## 2015-02-16 NOTE — ED Notes (Signed)
Per EMS, pt is from home.  Pt here with altered mental status.  Pt hx of UTI and has urinary odor and dark color.  Same presentation with previous hx of UTI.  Colostomy bag in place.  Vitals: 164/86, cbg 266, hr 92, resp 16, 98% ra  22g LFA.

## 2015-02-16 NOTE — H&P (Signed)
History and Physical  [name] EYC:[id number] DOB: 11/05/1929 DOA: 02/16/2015  Referring physician: Dr. Marin Roberts, EDP PCP: [name], MD  Outpatient Specialists:  1. Not known  Chief Complaint: Altered mental status  HPI: [name] is a 81 y.o. female , lives with her daughter, nonambulatory for approximately 2 years, history of dementia, recurrent urinary tract infections, CVA with residual right hemiparesis, chronic diastolic CHF, COPD-not on home oxygen, HTN, PAF-not on anticoagulation, ongoing tobacco abuse, colon cancer s/p partial colectomy with colostomy, recent hospitalization 01/21/15-01/26/15 for altered mental status secondary to community-acquired pneumonia & CAUTI, presented to the [facility] ED on 02/16/15 with altered mental status. Patient was discharged from the hospital to SNF. She had chronic indwelling Foley catheter which was removed at the nursing facility approximately 2 weeks ago and she completed a course of antibiotics for UTI. She returned home 5 days ago. Over the last 2 days, daughter has noticed patient is not behaving herself. Patient is unable to provide any history secondary to altered mental status. According to daughter, patient has mild dementia and is able to hold a decent conversation. Over the 2 days, she has been repeating herself, repeatedly saying "help, help", complaining of intermittent lower abdominal pain/cramps and noted to have foul-smelling urine. No reported fever, nausea, vomiting or diarrhea. Appetite has decreased. Today she has been excessively sleepy. Due to concern for another episode of UTI, patient presented to the hospital where she had low-grade temperature of 100.6, Potassium 3.3, glucose 177, BU and 24, creatinine 1.01, hemoglobin 11.3, chest x-ray without acute abnormality, urine microscopy suspicious for UTI. CT head and abdomen requested and pending. Recent urine cultures had grown Escherichia coli and  Pseudomonas. She has been started on cefepime. Hospitalist admission requested.  Review of Systems: All systems reviewed and apart from history of presenting illness, are negative.  Past Medical History  Diagnosis Date  . COPD (chronic obstructive pulmonary disease)   . CHF (congestive heart failure)     ECHO 2011  EF: 60% to 14%, grd 1 distolic dysfxn, 97-02% 6378 ECHO, tech limited by Afib  . Hypertension   . Stroke 2008    rt side deficit  . A-fib   . Back pain   . Constipation   . SBO (small bowel obstruction)     per GI note in 2011, pt denies  . Upper GI bleed 2011    required hospitalization  . Colon polyps   . Tobacco abuse   . Diabetes mellitus     per daughter doctor took patient off DM medication 6 months ago    Past Surgical History  Procedure Laterality Date  . Total abdominal hysterectomy  1980  . Colonoscopy N/A 09/04/2013    Procedure: COLONOSCOPY;  Surgeon: Lear Ng, MD;  Location: [facility] ENDOSCOPY;  Service: Endoscopy;  Laterality: N/A;  . Colon resection N/A 09/06/2013    Procedure: PARTIAL COLECTOMY WITH COLOSTOMY;  Surgeon: Earnstine Regal, MD;  Location: [place];  Service: General;  Laterality: N/A;  . Colostomy revision N/A 09/08/2013    Procedure: COLOSTOMY REVISION;  Surgeon: Gwenyth Ober, MD;  Location: [place];  Service: General;  Laterality: N/A;   Social History:  reports that she has been smoking Cigarettes.  She has a 7.5 pack-year smoking history. She has never used smokeless tobacco. She reports that she drinks about 1.2 oz of alcohol per week. She reports that she does not use illicit drugs. Patient lives with her  daughter. She is bedbound. Daughter helps patient out of the bed to the wheelchair. She continues to smoke but has not done so in the last 5 days. She occasionally drinks alcohol.  Allergies  Allergen Reactions  . Aggrenox [Aspirin-Dipyridamole Er] Diarrhea and Nausea And Vomiting    & abdominal bleeding    Family History    Problem Relation Age of Onset  . Lung cancer Mother   . Heart disease Father     Prior to Admission medications   Medication Sig Start Date End Date Taking? Authorizing Provider  acetaminophen (TYLENOL) 325 MG tablet Take 2 tablets (650 mg total) by mouth every 6 (six) hours as needed for mild pain, moderate pain or fever (or Fever >/= 101). 06/21/14  Yes Modena Jansky, MD  aspirin EC 81 MG EC tablet Take 1 tablet (81 mg total) by mouth daily. 01/25/15  Yes Albertine Patricia, MD  gabapentin (NEURONTIN) 100 MG capsule Take 2 capsules (200 mg total) by mouth 3 (three) times daily. 01/25/15  Yes Albertine Patricia, MD  LORazepam (ATIVAN) 0.5 MG tablet Take 0.5 mg by mouth 2 (two) times daily as needed for anxiety.   Yes Historical Provider, MD  metoprolol tartrate (LOPRESSOR) 12.5 mg TABS tablet Take 0.5 tablets (12.5 mg total) by mouth 2 (two) times daily. 06/21/14  Yes Modena Jansky, MD  nitroGLYCERIN (NITROSTAT) 0.4 MG SL tablet Place 0.4 mg under the tongue every 5 (five) minutes as needed for chest pain.   Yes Historical Provider, MD  prochlorperazine (COMPAZINE) 10 MG tablet Take 10 mg by mouth every 8 (eight) hours as needed for nausea or vomiting.   Yes Historical Provider, MD  QUEtiapine (SEROQUEL) 25 MG tablet Take 1 tablet by mouth at bedtime. 01/03/15  Yes Historical Provider, MD  traMADol (ULTRAM) 50 MG tablet Take 0.5 tablets (25 mg total) by mouth every 6 (six) hours as needed. pain Patient taking differently: Take 50 mg by mouth every 6 (six) hours as needed for moderate pain. pain 01/25/15  Yes Albertine Patricia, MD   Physical Exam: Filed Vitals:   02/16/15 1327 02/16/15 1715 02/16/15 1730 02/16/15 1758  BP: 182/76  166/59 166/59  Pulse: 101 88 89 97  Temp: 100.6 F (38.1 C)   98.4 F (36.9 C)  TempSrc: Rectal   Rectal  Resp: 18   18  SpO2: 95% 93% 91% 97%     General exam: Moderately built and nourished pleasant elderly female patient, lying comfortably supine on the  gurney in no obvious distress. Disheveled and unkempt.  Head, eyes and ENT: Nontraumatic and normocephalic. Pupils equally reacting to light and accommodation. Oral mucosa slightly dry. Upper dentures +. Superficial abrasion on chin (apparently secondary to picking with nails).  Neck: Supple. No JVD, carotid bruit or thyromegaly.  Lymphatics: No lymphadenopathy.  Respiratory system: Poor inspiratory effort/Clear to auscultation. No increased work of breathing.  Cardiovascular system: S1 and S2 heard, RRR. No JVD, murmurs, gallops, clicks or pedal edema.  Gastrointestinal system: Abdomen is nondistended, soft and nontender. Normal bowel sounds heard. No organomegaly or masses appreciated. Left colostomy: Currently empty but normal BM as per daughter.  Central nervous system: Alert and oriented only to self. No new focal neurological deficits.  Extremities: At least 4 x 5 power in left limbs, contracture of right upper extremity with grade 2 x 5 power at least in right limbs. Peripheral pulses symmetrically felt.   Skin: No rashes or acute findings. Approximately 0.5 cm stage  II clean mid sacral decubitus ulcer.  Musculoskeletal system: Negative exam.  Psychiatry: Pleasant and cooperative.   Labs on Admission:  Basic Metabolic Panel:  Recent Labs Lab 02/16/15 1400  NA 143  K 3.3*  CL 105  CO2 27  GLUCOSE 177*  BUN 24*  CREATININE 1.01*  CALCIUM 8.9   Liver Function Tests:  Recent Labs Lab 02/16/15 1400  AST 14*  ALT 9*  ALKPHOS 52  BILITOT 0.3  PROT 6.7  ALBUMIN 2.9*   No results for input(s): LIPASE, AMYLASE in the last 168 hours. No results for input(s): AMMONIA in the last 168 hours. CBC:  Recent Labs Lab 02/16/15 1400  WBC 9.6  NEUTROABS 6.0  HGB 11.3*  HCT 37.1  MCV 91.4  PLT 429*   Cardiac Enzymes: No results for input(s): CKTOTAL, CKMB, CKMBINDEX, TROPONINI in the last 168 hours.  BNP (last 3 results) No results for input(s): PROBNP in the  last 8760 hours. CBG: No results for input(s): GLUCAP in the last 168 hours.  Radiological Exams on Admission: Dg Chest Port 1 View  02/16/2015   CLINICAL DATA:  Altered mental status.  EXAM: PORTABLE CHEST - 1 VIEW  COMPARISON:  01/21/2015.  FINDINGS: The cardiac silhouette remains borderline enlarged. Clear lungs. Diffuse osteopenia. Lower thoracic spine degenerative changes.  IMPRESSION: No acute abnormality.   Electronically Signed   By: Claudie Revering M.D.   On: 02/16/2015 14:47    EKG: Independently reviewed. Sinus tachycardia at 102 bpm and RBBB morphology (not new).  Assessment/Plan Principal Problem:   Recurrent UTI - Continue IV cefepime pending urine culture results. - Chronic Foley catheter was removed approximately 2 weeks ago.  Active Problems:   Acute encephalopathy - Secondary to UTI complicating underlying dementia - No new focal deficits - Check CT head    Hypokalemia - Replace and follow    Paroxysmal atrial fibrillation - Currently in sinus tachycardia - Continue metoprolol and aspirin - CHA2DS2-VASc Score: At least 8/high risk for thromboembolism. Probably not on anticoagulation secondary to fall risk - Chronic drawn telemetry    Hypertension - Mildly uncontrolled - Continue metoprolol and monitor    Anemia - Follow CBC    Dementia - As per daughter's report, mild and able to have a conversation    H/O: CVA (cerebrovascular accident)- with R Hemiparesis/contractures - Continue aspirin    Tobacco abuse    DNR (do not resuscitate) - Confirmed with daughter at bedside   Stage II small sacral decubitus ulcer - WOC consultation   Uncontrolled type II DM - Last A1c 06/17/14 was 7.1 - Not on hypoglycemics PTA - Check A1c and place on SSI   Chronic diastolic CHF - Clinically appears volume depleted. Brief IV fluids with close monitoring.  COPD - Stable  Code Status: DO NOT RESUSCITATE  Family Communication: Discussed with patient's daughter  in detail at bedside  Disposition Plan: Home when medically stable   Time spent: 20 minutes  HONGALGI,ANAND, MD, FACP, FHM. [facility] Pager [phone]  If 7PM-7AM, please contact night-coverage [email or web address] Password [facility] 02/16/2015, 6:14 PM

## 2015-02-16 NOTE — ED Provider Notes (Signed)
CSN: [id number]     Arrival date & time 02/16/15  1316 History   First MD Initiated Contact with Patient 02/16/15 1501     Chief Complaint  Patient presents with  . Altered Mental Status    The history is provided by a relative. No language interpreter was used.   [name] presents for evaluation of change in mental status. Level V caveat due to confusion, dementia. Hx is provided by daughter.  Per report patient has had increased confusion since yesterday. She is normally alert and only mildly confused and is very talkative.Daughter states the patient been less verbal since yesterday and very confused at times. Today her speech has been unintelligible. No reports of fevers. Very little oral intake. No vomiting, no change in stool output. Patient has a history of urinary incontinence and has been wetting herself with very foul-smelling urine. She has a history of frequent urinary tract infections and catheter use. She was discharged from the hospital 2 weeks ago following catheter associated infection and was discharged to nursing home. The catheter was removed and she was sent home from the nursing home 3 days ago. The patient is not ambulatory at baseline due to history of recurrent strokes. Daughter has been caring for her at home. No history of recent injury. Symptoms are moderate, constant, worsening.  Past Medical History  Diagnosis Date  . COPD (chronic obstructive pulmonary disease)   . CHF (congestive heart failure)     ECHO 2011  EF: 60% to 50%, grd 1 distolic dysfxn, 53-97% 6734 ECHO, tech limited by Afib  . Hypertension   . Stroke 2008    rt side deficit  . A-fib   . Back pain   . Constipation   . SBO (small bowel obstruction)     per GI note in 2011, pt denies  . Upper GI bleed 2011    required hospitalization  . Colon polyps   . Tobacco abuse   . Diabetes mellitus     per daughter doctor took patient off DM medication 6 months ago    Past Surgical History  Procedure  Laterality Date  . Total abdominal hysterectomy  1980  . Colonoscopy N/A 09/04/2013    Procedure: COLONOSCOPY;  Surgeon: Lear Ng, MD;  Location: [facility] ENDOSCOPY;  Service: Endoscopy;  Laterality: N/A;  . Colon resection N/A 09/06/2013    Procedure: PARTIAL COLECTOMY WITH COLOSTOMY;  Surgeon: Earnstine Regal, MD;  Location: [place];  Service: General;  Laterality: N/A;  . Colostomy revision N/A 09/08/2013    Procedure: COLOSTOMY REVISION;  Surgeon: Gwenyth Ober, MD;  Location: [place];  Service: General;  Laterality: N/A;   History reviewed. No pertinent family history. History  Substance Use Topics  . Smoking status: Current Every Day Smoker -- 0.50 packs/day for 15 years    Types: Cigarettes  . Smokeless tobacco: Never Used  . Alcohol Use: 1.2 oz/week    1 Cans of beer, 1 Shots of liquor per week     Comment: patient states she drinks seldom last drink 2 weeks ago    OB History    No data available     Review of Systems  All other systems reviewed and are negative.     Allergies  Aggrenox  Home Medications   Prior to Admission medications   Medication Sig Start Date End Date Taking? Authorizing Provider  acetaminophen (TYLENOL) 325 MG tablet Take 2 tablets (650 mg total) by mouth every 6 (six) hours  as needed for mild pain, moderate pain or fever (or Fever >/= 101). 06/21/14  Yes Modena Jansky, MD  aspirin EC 81 MG EC tablet Take 1 tablet (81 mg total) by mouth daily. 01/25/15  Yes Albertine Patricia, MD  gabapentin (NEURONTIN) 100 MG capsule Take 2 capsules (200 mg total) by mouth 3 (three) times daily. 01/25/15  Yes Albertine Patricia, MD  LORazepam (ATIVAN) 0.5 MG tablet Take 0.5 mg by mouth 2 (two) times daily as needed for anxiety.   Yes Historical Provider, MD  metoprolol tartrate (LOPRESSOR) 12.5 mg TABS tablet Take 0.5 tablets (12.5 mg total) by mouth 2 (two) times daily. 06/21/14  Yes Modena Jansky, MD  nitroGLYCERIN (NITROSTAT) 0.4 MG SL tablet Place 0.4 mg  under the tongue every 5 (five) minutes as needed for chest pain.   Yes Historical Provider, MD  prochlorperazine (COMPAZINE) 10 MG tablet Take 10 mg by mouth every 8 (eight) hours as needed for nausea or vomiting.   Yes Historical Provider, MD  QUEtiapine (SEROQUEL) 25 MG tablet Take 1 tablet by mouth at bedtime. 01/03/15  Yes Historical Provider, MD  traMADol (ULTRAM) 50 MG tablet Take 0.5 tablets (25 mg total) by mouth every 6 (six) hours as needed. pain Patient taking differently: Take 50 mg by mouth every 6 (six) hours as needed for moderate pain. pain 01/25/15  Yes Albertine Patricia, MD  PRESCRIPTION MEDICATION Patient was taking an antibiotic at [place] home, per pts Daughter, unknown antibiotic with unknown duration. Completed course about 7 days ago.    Historical Provider, MD   BP 182/76 mmHg  Pulse 101  Temp(Src) 100.6 F (38.1 C) (Rectal)  Resp 18  SpO2 95% Physical Exam  Constitutional: She appears well-developed and well-nourished.  HENT:  Head: Normocephalic and atraumatic.  Eyes: Pupils are equal, round, and reactive to light.  Cardiovascular: Regular rhythm.   No murmur heard. Tachycardic  Pulmonary/Chest: Effort normal. No respiratory distress.  Decreased air movement in bilateral bases  Abdominal: Soft. There is no tenderness. There is no rebound and no guarding.  Ostomy and left upper quadrant, small amount of firm stool in pouch.  Musculoskeletal: She exhibits no edema or tenderness.  Contractures of right upper extremity, bilateral lower extremities  Neurological: She is alert.  Confused. Difficult to understand speech. Answers basic questions and follows very basic commands.  Skin: Skin is warm and dry.  Psychiatric:  Unable to assess  Nursing note and vitals reviewed.   ED Course  Procedures (including critical care time) Labs Review Labs Reviewed  CBC WITH DIFFERENTIAL/PLATELET - Abnormal; Notable for the following:    Hemoglobin 11.3 (*)     Platelets 429 (*)    All other components within normal limits  COMPREHENSIVE METABOLIC PANEL - Abnormal; Notable for the following:    Potassium 3.3 (*)    Glucose, Bld 177 (*)    BUN 24 (*)    Creatinine, Ser 1.01 (*)    Albumin 2.9 (*)    AST 14 (*)    ALT 9 (*)    GFR calc non Af Amer 50 (*)    GFR calc Af Amer 58 (*)    All other components within normal limits  URINALYSIS, ROUTINE W REFLEX MICROSCOPIC - Abnormal; Notable for the following:    APPearance TURBID (*)    Glucose, UA 100 (*)    Hgb urine dipstick MODERATE (*)    Protein, ur >300 (*)    Leukocytes, UA SMALL (*)  All other components within normal limits  URINE MICROSCOPIC-ADD ON - Abnormal; Notable for the following:    Bacteria, UA MANY (*)    All other components within normal limits  CULTURE, BLOOD (ROUTINE X 2)  CULTURE, BLOOD (ROUTINE X 2)  URINE CULTURE  I-STAT CG4 LACTIC ACID, ED    Imaging Review Dg Chest Port 1 View  02/16/2015   CLINICAL DATA:  Altered mental status.  EXAM: PORTABLE CHEST - 1 VIEW  COMPARISON:  01/21/2015.  FINDINGS: The cardiac silhouette remains borderline enlarged. Clear lungs. Diffuse osteopenia. Lower thoracic spine degenerative changes.  IMPRESSION: No acute abnormality.   Electronically Signed   By: Claudie Revering M.D.   On: 02/16/2015 14:47     EKG Interpretation   Date/Time:  Sunday Feb 16 2015 13:56:25 EDT Ventricular Rate:  102 PR Interval:  94 QRS Duration: 140 QT Interval:  445 QTC Calculation: 580 R Axis:   68 Text Interpretation:  Sinus tachycardia Right bundle branch block  Confirmed by [name] [phone]) on 02/16/2015 4:48:59 PM      MDM   Final diagnoses:  Acute UTI   patient with history of recurrent UTIs here with change in mental status. Urinalysis is consistent with UTI, review prior culture data and patient with resistance Escherichia coli as well as pseudomonas. Treating with cefepime per pharmacy recommendations. Discussed with hospitalist  regarding admission for further workup. Checking CT stone study to evaluate for any features of UTI given patient's history of recurrent infections. Checking CT head given patient's change in mental status and history of multiple strokes in the past. Family updated regarding plan to admit for further management.    Quintella Reichert, MD 02/16/15 1810

## 2015-02-16 NOTE — Progress Notes (Signed)
ANTIBIOTIC CONSULT NOTE - INITIAL  Pharmacy Consult for Cefepime Indication: UTI  Allergies  Allergen Reactions  . Aggrenox [Aspirin-Dipyridamole Er] Diarrhea and Nausea And Vomiting    & abdominal bleeding    Patient Measurements:   Adjusted Body Weight:   Vital Signs: Temp: 100.6 F (38.1 C) (05/22 1327) Temp Source: Rectal (05/22 1327) BP: 182/76 mmHg (05/22 1327) Pulse Rate: 101 (05/22 1327) Intake/Output from previous day:   Intake/Output from this shift:    Labs:  Recent Labs  02/16/15 1400  WBC 9.6  HGB 11.3*  PLT 429*  CREATININE 1.01*   CrCl cannot be calculated (Unknown ideal weight.). No results for input(s): VANCOTROUGH, VANCOPEAK, VANCORANDOM, GENTTROUGH, GENTPEAK, GENTRANDOM, TOBRATROUGH, TOBRAPEAK, TOBRARND, AMIKACINPEAK, AMIKACINTROU, AMIKACIN in the last 72 hours.   Microbiology: Recent Results (from the past 720 hour(s))  Urine culture     Status: None   Collection Time: 01/21/15  1:03 PM  Result Value Ref Range Status   Specimen Description URINE, CATHETERIZED  Final   Special Requests NONE  Final   Colony Count   Final    >=100,000 COLONIES/ML Performed at [facility]    Culture   Final    ESCHERICHIA COLI PSEUDOMONAS AERUGINOSA Performed at [facility]    Report Status 01/27/2015 FINAL  Final   Organism ID, Bacteria ESCHERICHIA COLI  Final   Organism ID, Bacteria PSEUDOMONAS AERUGINOSA  Final      Susceptibility   Escherichia coli - MIC*    AMPICILLIN >=32 RESISTANT Resistant     CEFAZOLIN 32 RESISTANT Resistant     CEFTRIAXONE <=1 SENSITIVE Sensitive     CIPROFLOXACIN >=4 RESISTANT Resistant     GENTAMICIN >=16 RESISTANT Resistant     LEVOFLOXACIN >=8 RESISTANT Resistant     NITROFURANTOIN <=16 SENSITIVE Sensitive     TOBRAMYCIN 8 INTERMEDIATE Intermediate     TRIMETH/SULFA >=320 RESISTANT Resistant     PIP/TAZO >=128 RESISTANT Resistant     * ESCHERICHIA COLI   Pseudomonas aeruginosa - MIC*    CEFEPIME 2  SENSITIVE Sensitive     CEFTAZIDIME 2 SENSITIVE Sensitive     CIPROFLOXACIN <=0.25 SENSITIVE Sensitive     GENTAMICIN 2 SENSITIVE Sensitive     IMIPENEM 2 SENSITIVE Sensitive     PIP/TAZO 8 SENSITIVE Sensitive     TOBRAMYCIN <=1 SENSITIVE Sensitive     * PSEUDOMONAS AERUGINOSA  Culture, sputum-assessment     Status: None   Collection Time: 01/21/15  3:40 PM  Result Value Ref Range Status   Specimen Description SPUTUM  Final   Special Requests NONE  Final   Sputum evaluation   Final    MICROSCOPIC FINDINGS SUGGEST THAT THIS SPECIMEN IS NOT REPRESENTATIVE OF LOWER RESPIRATORY SECRETIONS. PLEASE RECOLLECT. INFORMED WILLIAM @1607  ON 4.26.16 BY MCCOY,N.    Report Status 01/21/2015 FINAL  Final  Culture, expectorated sputum-assessment     Status: None   Collection Time: 01/25/15  7:42 PM  Result Value Ref Range Status   Specimen Description SPUTUM  Final   Special Requests NONE  Final   Sputum evaluation   Final    THIS SPECIMEN IS ACCEPTABLE. RESPIRATORY CULTURE REPORT TO FOLLOW.   Report Status 01/25/2015 FINAL  Final  Culture, respiratory (NON-Expectorated)     Status: None   Collection Time: 01/25/15  7:42 PM  Result Value Ref Range Status   Specimen Description SPUTUM  Final   Special Requests NONE  Final   Gram Stain   Final  MODERATE WBC PRESENT,BOTH PMN AND MONONUCLEAR NO SQUAMOUS EPITHELIAL CELLS SEEN NO ORGANISMS SEEN Performed at [facility]    Culture   Final    NORMAL OROPHARYNGEAL FLORA Performed at [facility]    Report Status 01/28/2015 FINAL  Final    Medical History: Past Medical History  Diagnosis Date  . COPD (chronic obstructive pulmonary disease)   . CHF (congestive heart failure)     ECHO 2011  EF: 60% to 48%, grd 1 distolic dysfxn, 18-56% 3149 ECHO, tech limited by Afib  . Hypertension   . Stroke 2008    rt side deficit  . A-fib   . Back pain   . Constipation   . SBO (small bowel obstruction)     per GI note in 2011, pt  denies  . Upper GI bleed 2011    required hospitalization  . Colon polyps   . Tobacco abuse   . Diabetes mellitus     per daughter doctor took patient off DM medication 6 months ago     Assessment: 44 YOF presents with AMS, recently discharged with catheter-related UTI.  E. Coli and pseudomonas was identified but cultures were not finalized prior to discharge.  Patient appears to have never received active antibiotic for pseudomonas (ceftriaxone in hospital to cefuroxime at discharge).  Contacted EDP and asked to change ceftriaxone to agent with pseudomonas coverage based on recent culture.   5/22 >> cefepime >>  4/26 Urine: E. Coli (R to amp, cefazolin, FQ, AMG, pip/tazo, TMP/SMZ), P. Aeruginosa (pan-susc) 5/22 Urine: 5/22 Blood:   Scr slightly elevated WBC WNL  Goal of Therapy:  Dose for patient-specific parameters  Plan:   Cefepime 1gm IV q12h, use q12h dosing to improved time >MIC   Follow renal function  Doreene Eland, PharmD, BCPS.   Pager: [phone] 02/16/2015,3:43 PM

## 2015-02-16 NOTE — ED Notes (Signed)
Bed: RM30 Expected date: 02/16/15 Expected time: 1:18 PM Means of arrival: Ambulance Comments: AMS

## 2015-02-17 DIAGNOSIS — I480 Paroxysmal atrial fibrillation: Secondary | ICD-10-CM

## 2015-02-17 DIAGNOSIS — G9340 Encephalopathy, unspecified: Secondary | ICD-10-CM

## 2015-02-17 DIAGNOSIS — N390 Urinary tract infection, site not specified: Principal | ICD-10-CM

## 2015-02-17 LAB — GLUCOSE, CAPILLARY
GLUCOSE-CAPILLARY: 155 mg/dL — AB (ref 65–99)
GLUCOSE-CAPILLARY: 139 mg/dL — AB (ref 65–99)
GLUCOSE-CAPILLARY: 131 mg/dL — AB (ref 65–99)
Glucose-Capillary: 162 mg/dL — ABNORMAL HIGH (ref 65–99)

## 2015-02-17 LAB — BASIC METABOLIC PANEL
ANION GAP: 9 (ref 5–15)
BUN: 25 mg/dL — ABNORMAL HIGH (ref 6–20)
CALCIUM: 8.7 mg/dL — AB (ref 8.9–10.3)
CO2: 27 mmol/L (ref 22–32)
Chloride: 107 mmol/L (ref 101–111)
Creatinine, Ser: 1 mg/dL (ref 0.44–1.00)
GFR calc Af Amer: 59 mL/min — ABNORMAL LOW (ref >60)
GFR, EST NON AFRICAN AMERICAN: 51 mL/min — AB (ref >60)
Glucose, Bld: 168 mg/dL — ABNORMAL HIGH (ref 65–99)
POTASSIUM: 4.5 mmol/L (ref 3.5–5.1)
SODIUM: 143 mmol/L (ref 135–145)

## 2015-02-17 LAB — CBC
HCT: 38.1 % (ref 36.0–46.0)
HEMOGLOBIN: 11.7 g/dL — AB (ref 12.0–15.0)
MCH: 28.5 pg (ref 26.0–34.0)
MCHC: 30.7 g/dL (ref 30.0–36.0)
MCV: 92.7 fL (ref 78.0–100.0)
PLATELETS: 375 10*3/uL (ref 150–400)
RBC: 4.11 MIL/uL (ref 3.87–5.11)
RDW: 15.6 % — ABNORMAL HIGH (ref 11.5–15.5)
WBC: 9.2 10*3/uL (ref 4.0–10.5)

## 2015-02-17 MED ORDER — VANCOMYCIN HCL IN DEXTROSE 1-5 GM/200ML-% IV SOLN
1000.0000 mg | INTRAVENOUS | Status: DC
  Administered 2015-02-18: 1000 mg via INTRAVENOUS
  Filled 2015-02-17 (×2): qty 200

## 2015-02-17 MED ORDER — VANCOMYCIN HCL 10 G IV SOLR
1250.0000 mg | Freq: Once | INTRAVENOUS | Status: AC
  Administered 2015-02-17: 1250 mg via INTRAVENOUS
  Filled 2015-02-17: qty 1250

## 2015-02-17 NOTE — Consult Note (Addendum)
WOC wound consult note Reason for Consult:Moisture associated skin damage, specifically, incontinence associated dermatitis. (This was noted to be a Stage 2 pressure injury initially and it was found not to be a PrI.) Wound type: MASD, specifically, IAD Pressure Ulcer POA: No Measurement: 5cm x 6cm with scattered open areas within, the largest of which measures 2.5cm x 0.2cm x 0.2cm Wound FUW:TKTC, moist Drainage (amount, consistency, odor) serous, scant Periwound:Macerated and erythematous Dressing procedure/placement/frequency: I will place a therapeutic mattress with low air loss feature for the management of the large amounts of moisture (urine).  Additionally, we will cleanse and protect using our house incontinence no-rinse cleanser and top with our clear zinc oxide moisture barrier.  Side to side turning and reposition efforts will continue. Routine ostomy supplies are provided. [place] nursing team will not follow, but will remain available to this patient, the nursing and medical team.  Please re-consult if needed. Thanks, Maudie Flakes, MSN, RN, [place], [place], [place] [phone])

## 2015-02-17 NOTE — Evaluation (Addendum)
Occupational Therapy Evaluation Patient Details Name: [name] MRN: [id number] DOB: 05-Jan-1930 Today's Date: 02/17/2015    History of Present Illness Logann LEOCADIA IDLEMAN is a 81 y.o. female has a past medical history significant for COPD, diastolic CHF, recurrent UTIs, PAF, presents to the hospital brought by her daughter with worsening mental status and intermittent hallucinations in the past 3 days.    Clinical Impression   Pt assisted to sit EOB with +2 assist required. No family present to provide information regarding PLOF. Pt appears fearful of movements/mobility and complaining of R foot hurting with movement. Will follow on acute to progress ADL/functional mobility. Will likely need SNF at d/c unless family feels they can care for pt at home. Per chart, pt recently at [facility] and had d/c back home.    Follow Up Recommendations  SNF;Supervision/Assistance - 24 hour    Equipment Recommendations   (TBA next venue or will need more info if pt d/c home)    Recommendations for Other Services       Precautions / Restrictions Precautions Precautions: Fall Precaution Comments: L colostomy Restrictions Weight Bearing Restrictions: No      Mobility Bed Mobility Overal bed mobility: Needs Assistance;+2 for physical assistance Bed Mobility: Rolling;Supine to Sit;Sit to Supine Rolling: +2 for physical assistance;Total assist   Supine to sit: +2 for physical assistance;Total assist Sit to supine: +2 for physical assistance;Total assist   General bed mobility comments: pt did help slightly with holding to rail using L UE to help with rolling onto R side. Pt appears fearful with all movements however. Pt complaining of R foot hurting with mobility and with touch to R foot.   Transfers                 General transfer comment: not able this visit.    Balance Overall balance assessment: Needs assistance Sitting-balance support: Bilateral upper extremity supported;Feet  unsupported Sitting balance-Leahy Scale: Zero   Postural control: Posterior lean                                  ADL Overall ADL's : Needs assistance/impaired   Eating/Feeding Details (indicate cue type and reason): not tested. Grooming: Wash/dry face;Minimal assistance;Bed level   Upper Body Bathing: Maximal assistance;Bed level   Lower Body Bathing: +2 for physical assistance;Total assistance;Bed level   Upper Body Dressing : Total assistance;Bed level   Lower Body Dressing: +2 for physical assistance;Total assistance;Bed level                 General ADL Comments: +2 assist to sit up on EOB. Pt very fearful with all movements and requires reassurance. pt complaining of R foot hurting when therapist touched R foot. Pt with heavy posterior lean in sitting and requires +2 assist to maintain sitting. Returned to supine and noted pt soiled with urine. Changed bed pads and assisted with hygiene and applicaiton of barrier cream. Pt did help with rolling to her R with use of bed rail.      Vision     Perception     Praxis      Pertinent Vitals/Pain Pain Assessment: Faces Faces Pain Scale: Hurts even more Pain Location: R foot Pain Descriptors / Indicators: Crying Pain Intervention(s): Repositioned     Hand Dominance     Extremity/Trunk Assessment Upper Extremity Assessment Upper Extremity Assessment: RUE deficits/detail RUE Deficits / Details: note flexion contractures in R  UE. Pt too anxious to allow much assessment. pt able to use LE UE to help with rolling.           Communication Communication Communication: No difficulties   Cognition Arousal/Alertness: Awake/alert Behavior During Therapy: Anxious Overall Cognitive Status: No family/caregiver present to determine baseline cognitive functioning                     General Comments       Exercises       Shoulder Instructions      Home Living Family/patient expects to be  discharged to:: Unsure                                 Additional Comments: pt not able to provide reliable history.      Prior Functioning/Environment          Comments: no family present but likley needs assist.     OT Diagnosis: Generalized weakness;Hemiplegia dominant side   OT Problem List: Decreased strength;Decreased knowledge of use of DME or AE;Impaired UE functional use;Impaired balance (sitting and/or standing);Decreased activity tolerance   OT Treatment/Interventions: Self-care/ADL training;Patient/family education;Therapeutic activities;DME and/or AE instruction    OT Goals(Current goals can be found in the care plan section) Acute Rehab OT Goals Patient Stated Goal: none stated by pt OT Goal Formulation: Patient unable to participate in goal setting Time For Goal Achievement: 03/03/15 Potential to Achieve Goals: Good  OT Frequency: Min 2X/week   Barriers to D/C:            Co-evaluation PT/OT/SLP Co-Evaluation/Treatment: Yes Reason for Co-Treatment: For patient/therapist safety   OT goals addressed during session: ADL's and self-care;Proper use of Adaptive equipment and DME      End of Session    Activity Tolerance: Other (comment) (pt fearful) Patient left: in bed;with bed alarm set;with call bell/phone within reach   Time: 3729-0211 OT Time Calculation (min): 15 min Charges:  OT General Charges $OT Visit: 1 Procedure OT Evaluation $Initial OT Evaluation Tier I: 1 Procedure G-Codes:    Jules Schick  [phone] 02/17/2015, 10:46 AM

## 2015-02-17 NOTE — Progress Notes (Signed)
ANTIBIOTIC CONSULT NOTE - INITIAL  Pharmacy Consult for Cefepime + Vancomycin Indication: UTI, positive blood culture  Allergies  Allergen Reactions  . Aggrenox [Aspirin-Dipyridamole Er] Diarrhea and Nausea And Vomiting    & abdominal bleeding   Patient Measurements: Height: 4\' 10"  (147.3 cm) Weight: 150 lb 2.1 oz (68.1 kg) IBW/kg (Calculated) : 40.9 Adjusted Body Weight:   Vital Signs: Temp: 98.1 F (36.7 C) (05/23 1354) Temp Source: Oral (05/23 1354) BP: 156/49 mmHg (05/23 1354) Pulse Rate: 78 (05/23 1354) Intake/Output from previous day: 05/22 0701 - 05/23 0700 In: 664 [I.V.:664] Out: -  Intake/Output from this shift: Total I/O In: 530 [P.O.:480; IV Piggyback:50] Out: -   Labs:  Recent Labs  02/16/15 1400 02/17/15 0800  WBC 9.6 9.2  HGB 11.3* 11.7*  PLT 429* 375  CREATININE 1.01* 1.00   Estimated Creatinine Clearance: 35.5 mL/min (by C-G formula based on Cr of 1). No results for input(s): VANCOTROUGH, VANCOPEAK, VANCORANDOM, GENTTROUGH, GENTPEAK, GENTRANDOM, TOBRATROUGH, TOBRAPEAK, TOBRARND, AMIKACINPEAK, AMIKACINTROU, AMIKACIN in the last 72 hours.   Microbiology: Recent Results (from the past 720 hour(s))  Urine culture     Status: None   Collection Time: 01/21/15  1:03 PM  Result Value Ref Range Status   Specimen Description URINE, CATHETERIZED  Final   Special Requests NONE  Final   Colony Count   Final    >=100,000 COLONIES/ML Performed at [facility]    Culture   Final    ESCHERICHIA COLI PSEUDOMONAS AERUGINOSA Performed at [facility]    Report Status 01/27/2015 FINAL  Final   Organism ID, Bacteria ESCHERICHIA COLI  Final   Organism ID, Bacteria PSEUDOMONAS AERUGINOSA  Final      Susceptibility   Escherichia coli - MIC*    AMPICILLIN >=32 RESISTANT Resistant     CEFAZOLIN 32 RESISTANT Resistant     CEFTRIAXONE <=1 SENSITIVE Sensitive     CIPROFLOXACIN >=4 RESISTANT Resistant     GENTAMICIN >=16 RESISTANT Resistant    LEVOFLOXACIN >=8 RESISTANT Resistant     NITROFURANTOIN <=16 SENSITIVE Sensitive     TOBRAMYCIN 8 INTERMEDIATE Intermediate     TRIMETH/SULFA >=320 RESISTANT Resistant     PIP/TAZO >=128 RESISTANT Resistant     * ESCHERICHIA COLI   Pseudomonas aeruginosa - MIC*    CEFEPIME 2 SENSITIVE Sensitive     CEFTAZIDIME 2 SENSITIVE Sensitive     CIPROFLOXACIN <=0.25 SENSITIVE Sensitive     GENTAMICIN 2 SENSITIVE Sensitive     IMIPENEM 2 SENSITIVE Sensitive     PIP/TAZO 8 SENSITIVE Sensitive     TOBRAMYCIN <=1 SENSITIVE Sensitive     * PSEUDOMONAS AERUGINOSA  Culture, sputum-assessment     Status: None   Collection Time: 01/21/15  3:40 PM  Result Value Ref Range Status   Specimen Description SPUTUM  Final   Special Requests NONE  Final   Sputum evaluation   Final    MICROSCOPIC FINDINGS SUGGEST THAT THIS SPECIMEN IS NOT REPRESENTATIVE OF LOWER RESPIRATORY SECRETIONS. PLEASE RECOLLECT. INFORMED WILLIAM @1607  ON 4.26.16 BY MCCOY,N.    Report Status 01/21/2015 FINAL  Final  Culture, expectorated sputum-assessment     Status: None   Collection Time: 01/25/15  7:42 PM  Result Value Ref Range Status   Specimen Description SPUTUM  Final   Special Requests NONE  Final   Sputum evaluation   Final    THIS SPECIMEN IS ACCEPTABLE. RESPIRATORY CULTURE REPORT TO FOLLOW.   Report Status 01/25/2015 FINAL  Final  Culture, respiratory (  NON-Expectorated)     Status: None   Collection Time: 01/25/15  7:42 PM  Result Value Ref Range Status   Specimen Description SPUTUM  Final   Special Requests NONE  Final   Gram Stain   Final    MODERATE WBC PRESENT,BOTH PMN AND MONONUCLEAR NO SQUAMOUS EPITHELIAL CELLS SEEN NO ORGANISMS SEEN Performed at [facility]    Culture   Final    NORMAL OROPHARYNGEAL FLORA Performed at [facility]    Report Status 01/28/2015 FINAL  Final  Culture, blood (routine x 2)     Status: None (Preliminary result)   Collection Time: 02/16/15  3:30 PM  Result Value  Ref Range Status   Specimen Description BLOOD LEFT FOREARM  5 ML IN [facility] BOTTLE  Final   Special Requests NONE  Final   Culture   Final    GRAM POSITIVE COCCI IN CLUSTERS Note: Gram Stain Report Called to,Read Back By and Verified With: RI ORAETVUNAM 02/17/15@1430  BY PARDA Performed at [facility]    Report Status PENDING  Incomplete  Culture, blood (routine x 2)     Status: None (Preliminary result)   Collection Time: 02/16/15  3:40 PM  Result Value Ref Range Status   Specimen Description BLOOD RIGHT HAND  5 ML IN [facility] BOTTLE  Final   Special Requests NONE  Final   Culture   Final           BLOOD CULTURE RECEIVED NO GROWTH TO DATE CULTURE WILL BE HELD FOR 5 DAYS BEFORE ISSUING A FINAL NEGATIVE REPORT Performed at [facility]    Report Status PENDING  Incomplete   Medical History: Past Medical History  Diagnosis Date  . COPD (chronic obstructive pulmonary disease)   . CHF (congestive heart failure)     ECHO 2011  EF: 60% to 35%, grd 1 distolic dysfxn, 70-17% 7939 ECHO, tech limited by Afib  . Hypertension   . Stroke 2008    rt side deficit  . A-fib   . Back pain   . Constipation   . SBO (small bowel obstruction)     per GI note in 2011, pt denies  . Upper GI bleed 2011    required hospitalization  . Colon polyps   . Tobacco abuse   . Diabetes mellitus     per daughter doctor took patient off DM medication 6 months ago    Assessment: 69 YOF presents with AMS, recently discharged with catheter-related UTI.  E. Coli and pseudomonas was identified but cultures were not finalized prior to discharge.  Patient appears to have never received active antibiotic for pseudomonas (ceftriaxone in hospital to cefuroxime at discharge).  Contacted EDP and asked to change ceftriaxone to agent with pseudomonas coverage based on recent culture.  5/23 add Vancomycin for 1/2positive blood cultures   5/22 >> cefepime >> 5/23 >> Vancomycin >>  4/26 Urine: E. Coli (R to amp,  cefazolin, FQ, AMG, pip/tazo, TMP/SMZ), P. Aeruginosa (pan-susc) 5/22 Urine: pending 5/22 Blood: 1/2 GPC  Scr 1.0 WBC WNL  Goal of Therapy:  Dose for patient-specific parameters  Plan:   Cefepime 1gm IV q12h, use q12h dosing to improved time >MIC   Vancomycin 1250mg  x1 then, 1gm q24 hr  Follow renal function, cultures  Minda Ditto PharmD Pager [phone] 02/17/2015, 3:12 PM

## 2015-02-17 NOTE — Evaluation (Signed)
Physical Therapy Evaluation Patient Details Name: [name] MRN: [id number] DOB: 09-25-1930 Today's Date: 02/17/2015   History of Present Illness  [name] is a 82 y.o. female has a past medical history significant for COPD, diastolic CHF, recurrent UTIs, PAF, CVA with residual right hemiparesis presents to the hospital brought by her daughter with worsening mental status and intermittent hallucinations, admitted for recurrent UTIs  Clinical Impression  Pt admitted with above diagnosis. Pt currently with functional limitations due to the deficits listed below (see PT Problem List).   Pt will benefit from skilled PT to increase their independence and safety with mobility to allow discharge to the venue listed below.   Pt poor historian and no family present.  Pt requiring total assist +2 for bed mobility and unable to sit EOB without complete trunk support.       Follow Up Recommendations SNF;Supervision/Assistance - 24 hour    Equipment Recommendations  Wheelchair (measurements PT);Hospital bed (hoyer lift)    Recommendations for Other Services       Precautions / Restrictions Precautions Precautions: Fall Precaution Comments: L colostomy Restrictions Weight Bearing Restrictions: No      Mobility  Bed Mobility Overal bed mobility: Needs Assistance;+2 for physical assistance Bed Mobility: Rolling;Supine to Sit;Sit to Supine Rolling: +2 for physical assistance;Total assist   Supine to sit: +2 for physical assistance;Total assist Sit to supine: +2 for physical assistance;Total assist   General bed mobility comments: pt did help slightly with holding to rail using L UE to help with rolling onto R side. Pt appears fearful/anxious with all movements however. Pt complaining of R foot hurting with mobility and with touch to R foot. tends to resist hip flexion and knee flexion upon sitting creating posterior trunk lean  Transfers                 General transfer  comment: not able this visit.  Ambulation/Gait                Stairs            Wheelchair Mobility    Modified Rankin (Stroke Patients Only)       Balance Overall balance assessment: Needs assistance Sitting-balance support: Bilateral upper extremity supported;Feet supported Sitting balance-Leahy Scale: Zero   Postural control: Posterior lean                                   Pertinent Vitals/Pain Pain Assessment: Faces Faces Pain Scale: Hurts even more Pain Location: R foot Pain Descriptors / Indicators: Moaning;Grimacing Pain Intervention(s): Repositioned    Home Living Family/patient expects to be discharged to:: Unsure (from home with daughter per chart)                 Additional Comments: pt not able to provide reliable history.    Prior Function           Comments: no family present but likley needs assist.      Hand Dominance        Extremity/Trunk Assessment   Upper Extremity Assessment: RUE deficits/detail RUE Deficits / Details: note flexion contractures in R UE. Pt too anxious to allow much assessment. pt able to use LE UE to help with rolling.         Lower Extremity Assessment: Generalized weakness;RLE deficits/detail;LLE deficits/detail RLE Deficits / Details: no active movement observed, required assist LLE Deficits / Details: very  limited active movement observed, L ankle contracture in increased PF     Communication   Communication: No difficulties  Cognition Arousal/Alertness: Awake/alert Behavior During Therapy: Anxious Overall Cognitive Status: No family/caregiver present to determine baseline cognitive functioning                      General Comments      Exercises        Assessment/Plan    PT Assessment Patient needs continued PT services  PT Diagnosis Generalized weakness   PT Problem List Decreased strength;Decreased activity tolerance;Decreased balance;Decreased  mobility;Decreased range of motion;Decreased cognition  PT Treatment Interventions DME instruction;Functional mobility training;Patient/family education;Wheelchair mobility training;Therapeutic activities;Therapeutic exercise;Balance training   PT Goals (Current goals can be found in the Care Plan section) Acute Rehab PT Goals Patient Stated Goal: none stated by pt PT Goal Formulation: Patient unable to participate in goal setting Time For Goal Achievement: 03/03/15 Potential to Achieve Goals: Fair    Frequency Min 2X/week   Barriers to discharge        Co-evaluation PT/OT/SLP Co-Evaluation/Treatment: Yes Reason for Co-Treatment: For patient/therapist safety PT goals addressed during session: Balance;Mobility/safety with mobility OT goals addressed during session: ADL's and self-care;Proper use of Adaptive equipment and DME       End of Session   Activity Tolerance: Patient limited by fatigue Patient left: in bed;with call bell/phone within reach;with bed alarm set           Time: 1000-1013 PT Time Calculation (min) (ACUTE ONLY): 13 min   Charges:   PT Evaluation $Initial PT Evaluation Tier I: 1 Procedure     PT G Codes:        Carleah Yablonski,KATHrine E 02/17/2015, 12:29 PM Carmelia Bake, PT, DPT 02/17/2015 Pager: [phone]

## 2015-02-17 NOTE — Progress Notes (Signed)
Pt refused am labs. Pt stated, "you are not going to draw any blood from me." Will let dayshift RN know to pass on to rounding MD. Carnella [place] I

## 2015-02-17 NOTE — Progress Notes (Signed)
[facility] PROGRESS NOTE  [name] JQZ:[id number] DOB: 1930-05-19 DOA: 02/16/2015 PCP: [name], MD  Assessment/Plan: Acute encephalopathy secondary to recurrent UTI On empiric cefepime based on prior culture. Afebrile and leukocytes stable.  Continue to monitor.  Hypokalemia Replenish  Paroxysmal A. fib Continue aspirin and metoprolol. Currently in sinus rhythm. High CHADS2 vasc score but patient not on" relation likely due to fall risk.  Essential hypertension Stable on metoprolol  Dementia Mild as per daughter. Seroquel at bedtime.  History of CVA with residual right hemiparesis Continue aspirin  Stage II sacral decubitus ulcer Wound care consulted.   seen by PT and recommend skilled nursing facility  DVT prophylaxis: Subcutaneous Lovenox Diet: Heart healthy   Code Status: DO NOT RESUSCITATE Family Communication: None at bedside Disposition Plan: Skilled nursing facility possibly in the next 24-48 hrs. if mental status improves.   Consultants:  None  Procedures:  None  Antibiotics:  IV cefepime  HPI/Subjective: Since seen and examined. Remains confused and yelling out for help.  Objective: Filed Vitals:   02/17/15 1354  BP: 156/49  Pulse: 78  Temp: 98.1 F (36.7 C)  Resp: 16    Intake/Output Summary (Last 24 hours) at 02/17/15 1430 Last data filed at 02/17/15 1329  Gross per 24 hour  Intake   1194 ml  Output      0 ml  Net   1194 ml   Filed Weights   02/16/15 1841  Weight: 68.1 kg (150 lb 2.1 oz)    Exam:   General:  Elderly female lying in bed ,confused, anxious and yelling out for help  HEENT: No pallor, moist oral mucosa, supple neck  Chest: Clear to auscultation bilaterally, no added sounds  CVS: Normal S1 and S2, no murmurs rub or gallop  GI: Soft, nondistended, ileostomy bag in place, bowel sounds present  Musculoskeletal: Warm, no edema  CNS: Right hemiparesis,[id number]   Data Reviewed: Basic  Metabolic Panel:  Recent Labs Lab 02/16/15 1400 02/17/15 0800  NA 143 143  K 3.3* 4.5  CL 105 107  CO2 27 27  GLUCOSE 177* 168*  BUN 24* 25*  CREATININE 1.01* 1.00  CALCIUM 8.9 8.7*   Liver Function Tests:  Recent Labs Lab 02/16/15 1400  AST 14*  ALT 9*  ALKPHOS 52  BILITOT 0.3  PROT 6.7  ALBUMIN 2.9*   No results for input(s): LIPASE, AMYLASE in the last 168 hours. No results for input(s): AMMONIA in the last 168 hours. CBC:  Recent Labs Lab 02/16/15 1400 02/17/15 0800  WBC 9.6 9.2  NEUTROABS 6.0  --   HGB 11.3* 11.7*  HCT 37.1 38.1  MCV 91.4 92.7  PLT 429* 375   Cardiac Enzymes: No results for input(s): CKTOTAL, CKMB, CKMBINDEX, TROPONINI in the last 168 hours. BNP (last 3 results) No results for input(s): BNP in the last 8760 hours.  ProBNP (last 3 results) No results for input(s): PROBNP in the last 8760 hours.  CBG:  Recent Labs Lab 02/16/15 2135 02/17/15 0729  GLUCAP 185* 155*    Recent Results (from the past 240 hour(s))  Culture, blood (routine x 2)     Status: None (Preliminary result)   Collection Time: 02/16/15  3:30 PM  Result Value Ref Range Status   Specimen Description BLOOD LEFT FOREARM  5 ML IN [facility] BOTTLE  Final   Special Requests NONE  Final   Culture   Final           BLOOD CULTURE RECEIVED  NO GROWTH TO DATE CULTURE WILL BE HELD FOR 5 DAYS BEFORE ISSUING A FINAL NEGATIVE REPORT Performed at [facility]    Report Status PENDING  Incomplete  Culture, blood (routine x 2)     Status: None (Preliminary result)   Collection Time: 02/16/15  3:40 PM  Result Value Ref Range Status   Specimen Description BLOOD RIGHT HAND  5 ML IN [facility] BOTTLE  Final   Special Requests NONE  Final   Culture   Final           BLOOD CULTURE RECEIVED NO GROWTH TO DATE CULTURE WILL BE HELD FOR 5 DAYS BEFORE ISSUING A FINAL NEGATIVE REPORT Performed at [facility]    Report Status PENDING  Incomplete     Studies: Ct Head Wo  Contrast  02/16/2015   CLINICAL DATA:  Altered mental status  EXAM: CT HEAD WITHOUT CONTRAST  TECHNIQUE: Contiguous axial images were obtained from the base of the skull through the vertex without intravenous contrast.  COMPARISON:  11/06/2010, 06/20/2014  FINDINGS: Bony calvarium is intact. No gross soft tissue abnormality is noted. Diffuse atrophic changes are seen. No acute hemorrhage, acute infarction or space-occupying mass lesion are. Changes of prior right parietal lobe infarct and left occipital infarct are seen.  IMPRESSION: Chronic atrophic and ischemic changes. No acute abnormality is seen.   Electronically Signed   By: Inez Catalina M.D.   On: 02/16/2015 18:29   Dg Chest Port 1 View  02/16/2015   CLINICAL DATA:  Altered mental status.  EXAM: PORTABLE CHEST - 1 VIEW  COMPARISON:  01/21/2015.  FINDINGS: The cardiac silhouette remains borderline enlarged. Clear lungs. Diffuse osteopenia. Lower thoracic spine degenerative changes.  IMPRESSION: No acute abnormality.   Electronically Signed   By: Claudie Revering M.D.   On: 02/16/2015 14:47   Ct Renal Stone Study  02/16/2015   CLINICAL DATA:  Altered mental status, UTI, colostomy  EXAM: CT ABDOMEN AND PELVIS WITHOUT CONTRAST  TECHNIQUE: Multidetector CT imaging of the abdomen and pelvis was performed following the standard protocol without IV contrast.  COMPARISON:  06/16/2014  FINDINGS: Lower chest: Trace right pleural effusion. Mild right lower lobe atelectasis.  Cardiomegaly.  Hepatobiliary: Unenhanced liver is unremarkable.  Layering gallstones (series 2/image 26), without associated inflammatory changes.  Pancreas: Within normal limits.  Spleen: Lobular spleen with stable splenic calcifications.  Adrenals/Urinary Tract: Mild left adrenal thickening. Right adrenal gland is within normal limits.  Kidneys are notable for bilateral renal vascular calcifications.  No renal, ureteral, or bladder calculi.  Mildly thick-walled bladder. Associated tiny foci of  nondependent gas (series 2/image 70), correlate for recent catheterization/intervention.  Stomach/Bowel: Stomach is notable for a small hiatal hernia.  No evidence of bowel obstruction.  Normal appendix.  Status post left hemicolectomy with distal transverse colostomy in the left lower quadrant and long Hartmann's pouch.  Vascular/Lymphatic: Atherosclerotic calcifications of the abdominal aorta and branch vessels.  No suspicious abdominopelvic lymphadenopathy.  Reproductive: Status post hysterectomy.  No adnexal masses.  Other: No abdominopelvic ascites.  Musculoskeletal: Degenerative changes of the visualized thoracolumbar spine.  Mild superior endplate changes at X32.  IMPRESSION: Mildly thick-walled bladder, likely related to known cystitis.  No renal, ureteral, or bladder calculi.  No hydronephrosis.  Additional stable ancillary findings as above.   Electronically Signed   By: Julian Hy M.D.   On: 02/16/2015 18:34    Scheduled Meds: . aspirin EC  81 mg Oral Daily  . ceFEPime (MAXIPIME) IV  1 g Intravenous Q12H  . enoxaparin (LOVENOX) injection  40 mg Subcutaneous Q24H  . gabapentin  200 mg Oral TID  . insulin aspart  0-9 Units Subcutaneous TID WC  . metoprolol tartrate  12.5 mg Oral BID  . QUEtiapine  25 mg Oral QHS  . sodium chloride  3 mL Intravenous Q12H   Continuous Infusions:     Time spent: [place], [place] [facility] Pager [phone] If 7PM-7AM, please contact night-coverage at [email or web address], password [facility] 02/17/2015, 2:30 PM  LOS: 1 day

## 2015-02-17 NOTE — Progress Notes (Signed)
CRITICAL VALUE ALERT  Critical value received: Blood culture  Gram positive cocci in clusters  Date of notification:  02/17/15  Time of notification:  1430  Critical value read back:Yes.    Nurse who received alert:  Sheffield Slider  MD notified (1st page):  Dr. Clementeen Graham  Time of first page:  1435  MD notified (2nd page):  Time of second page:  Responding MD:  order written  Time MD responded:  [phone]

## 2015-02-18 DIAGNOSIS — F0391 Unspecified dementia with behavioral disturbance: Secondary | ICD-10-CM

## 2015-02-18 LAB — GLUCOSE, CAPILLARY
GLUCOSE-CAPILLARY: 152 mg/dL — AB (ref 65–99)
GLUCOSE-CAPILLARY: 148 mg/dL — AB (ref 65–99)
Glucose-Capillary: 120 mg/dL — ABNORMAL HIGH (ref 65–99)
Glucose-Capillary: 124 mg/dL — ABNORMAL HIGH (ref 65–99)

## 2015-02-18 LAB — HEMOGLOBIN A1C
Hgb A1c MFr Bld: 7.4 % — ABNORMAL HIGH (ref 4.8–5.6)
Mean Plasma Glucose: 166 mg/dL

## 2015-02-18 NOTE — Progress Notes (Addendum)
[facility] PROGRESS NOTE  [name] ESP:[id number] DOB: [birth year] DOA: 02/16/2015 PCP: [name], MD  Brief narrative 81 year old female with history of dementia, CVA with residual right hemiparesis, chronic diastolic CHF, COPD, hypertension, paroxysmal A. fib (not on an deformation), ongoing tobacco use, colon cancer status post partial colectomy with colostomy, nonambulatory, recurrent UTI who was recently hospitalized 3 weeks back with altered mental status secondary to community acquired pneumonia and catheter associated UTI and has currently been living with her daughter presented to [facility] [place] on 5/22 with acute encephalopathy. Patient was discharged home from skilled nursing facility recently and had a chronic indwelling Foley which was removed at the nursing facility 2 weeks back upon discharge. As per daughter patient has mild dementia and is able to carry out a decent conversation but for the past 2 days prior to admission she was repeating herself, yelling for help and complaining of lower abdominal and found to have foul-smelling urine. She also had poor appetite and was increasingly sleepy. No history of fever, nausea, vomiting or diarrhea. Patient admitted to hospital service for acute encephalopathy secondary to UTI.  Assessment/Plan: Acute encephalopathy secondary to recurrent UTI On empiric cefepime based on prior culture. Urine culture growing Escherichia coli. Sensitivity pending. Patient is afebrile and WBC stable. Patient continues to be encephalopathic. Follow final culture and sensitivity. -Blood culture 1/2 from admission growing gram-positive cocci which I think is contaminant. I have added empiric vancomycin and will follow up with repeat culture.    Hypokalemia Replenished  Paroxysmal A. fib Continue aspirin and metoprolol. Currently in sinus rhythm. High CHADS2 vasc score but patient not on anticoagulation likely due to fall risk.  Essential  hypertension Stable on metoprolol  Dementia Mild as per daughter. Seroquel at bedtime.  History of CVA with residual right hemiparesis Continue aspirin  Stage II sacral decubitus ulcer Wound care consulted. Recommended therapeutic mattress    seen by PT and recommend skilled nursing facility  DVT prophylaxis: Subcutaneous Lovenox Diet: Heart healthy   Code Status: DO NOT RESUSCITATE Family Communication: Spoke with daughter on the phone. Disposition Plan: PT recommends skilled nursing facility but daughter wants to take her home  If mental status improves to baseline can be discharged on 5/26.  Consultants:  None  Procedures:  CT head   CT renal  Antibiotics:  IV cefepime 5/22-  HPI/Subjective: Patient remains confused and crying out for help. Remains afebrile and vitals stable. Pt crying out for help and calling mary , her daughter. Says she lives at home with her mother.  Objective: Filed Vitals:   02/18/15 1311  BP: 139/37  Pulse: 66  Temp: 98.1 F (36.7 C)  Resp: 16    Intake/Output Summary (Last 24 hours) at 02/18/15 1345 Last data filed at 02/18/15 1311  Gross per 24 hour  Intake   1090 ml  Output    100 ml  Net    990 ml   Filed Weights   02/16/15 1841  Weight: 68.1 kg (150 lb 2.1 oz)    Exam:   General:  Elderly female lying in bed ,confused, anxious and yelling out for help  HEENT: No pallor, moist oral mucosa, supple neck  Chest: Clear to auscultation bilaterally, no added sounds  CVS: Normal S1 and S2, no murmurs rub or gallop  GI: Soft, nondistended, ileostomy bag in place, bowel sounds present  Musculoskeletal: Warm, no edema  CNS: Right hemiparesis, confused and disoriented. Slightly improved from yesterday.    Data Reviewed:  Basic Metabolic Panel:  Recent Labs Lab 02/16/15 1400 02/17/15 0800  NA 143 143  K 3.3* 4.5  CL 105 107  CO2 27 27  GLUCOSE 177* 168*  BUN 24* 25*  CREATININE 1.01* 1.00  CALCIUM 8.9 8.7*    Liver Function Tests:  Recent Labs Lab 02/16/15 1400  AST 14*  ALT 9*  ALKPHOS 52  BILITOT 0.3  PROT 6.7  ALBUMIN 2.9*   No results for input(s): LIPASE, AMYLASE in the last 168 hours. No results for input(s): AMMONIA in the last 168 hours. CBC:  Recent Labs Lab 02/16/15 1400 02/17/15 0800  WBC 9.6 9.2  NEUTROABS 6.0  --   HGB 11.3* 11.7*  HCT 37.1 38.1  MCV 91.4 92.7  PLT 429* 375   Cardiac Enzymes: No results for input(s): CKTOTAL, CKMB, CKMBINDEX, TROPONINI in the last 168 hours. BNP (last 3 results) No results for input(s): BNP in the last 8760 hours.  ProBNP (last 3 results) No results for input(s): PROBNP in the last 8760 hours.  CBG:  Recent Labs Lab 02/17/15 1137 02/17/15 1644 02/17/15 2137 02/18/15 0740 02/18/15 1137  GLUCAP 162* 139* 131* 120* 152*    Recent Results (from the past 240 hour(s))  Urine culture     Status: None (Preliminary result)   Collection Time: 02/16/15  1:30 PM  Result Value Ref Range Status   Specimen Description URINE, CATHETERIZED  Final   Special Requests NONE  Final   Colony Count   Final    >=100,000 COLONIES/ML Performed at [facility]    Culture   Final    ESCHERICHIA COLI Performed at [facility]    Report Status PENDING  Incomplete  Culture, blood (routine x 2)     Status: None (Preliminary result)   Collection Time: 02/16/15  3:30 PM  Result Value Ref Range Status   Specimen Description BLOOD LEFT FOREARM  5 ML IN [facility] BOTTLE  Final   Special Requests NONE  Final   Culture   Final    GRAM POSITIVE COCCI IN CLUSTERS Note: Gram Stain Report Called to,Read Back By and Verified With: RI ORAETVUNAM 02/17/15@1430  BY PARDA Performed at [facility]    Report Status PENDING  Incomplete  Culture, blood (routine x 2)     Status: None (Preliminary result)   Collection Time: 02/16/15  3:40 PM  Result Value Ref Range Status   Specimen Description BLOOD RIGHT HAND  5 ML IN [facility] BOTTLE   Final   Special Requests NONE  Final   Culture   Final           BLOOD CULTURE RECEIVED NO GROWTH TO DATE CULTURE WILL BE HELD FOR 5 DAYS BEFORE ISSUING A FINAL NEGATIVE REPORT Performed at [facility]    Report Status PENDING  Incomplete     Studies: Ct Head Wo Contrast  02/16/2015   CLINICAL DATA:  Altered mental status  EXAM: CT HEAD WITHOUT CONTRAST  TECHNIQUE: Contiguous axial images were obtained from the base of the skull through the vertex without intravenous contrast.  COMPARISON:  11/06/2010, 06/20/2014  FINDINGS: Bony calvarium is intact. No gross soft tissue abnormality is noted. Diffuse atrophic changes are seen. No acute hemorrhage, acute infarction or space-occupying mass lesion are. Changes of prior right parietal lobe infarct and left occipital infarct are seen.  IMPRESSION: Chronic atrophic and ischemic changes. No acute abnormality is seen.   Electronically Signed   By: Linus Mako.D.  On: 02/16/2015 18:29   Dg Chest Port 1 View  02/16/2015   CLINICAL DATA:  Altered mental status.  EXAM: PORTABLE CHEST - 1 VIEW  COMPARISON:  01/21/2015.  FINDINGS: The cardiac silhouette remains borderline enlarged. Clear lungs. Diffuse osteopenia. Lower thoracic spine degenerative changes.  IMPRESSION: No acute abnormality.   Electronically Signed   By: Claudie Revering M.D.   On: 02/16/2015 14:47   Ct Renal Stone Study  02/16/2015   CLINICAL DATA:  Altered mental status, UTI, colostomy  EXAM: CT ABDOMEN AND PELVIS WITHOUT CONTRAST  TECHNIQUE: Multidetector CT imaging of the abdomen and pelvis was performed following the standard protocol without IV contrast.  COMPARISON:  06/16/2014  FINDINGS: Lower chest: Trace right pleural effusion. Mild right lower lobe atelectasis.  Cardiomegaly.  Hepatobiliary: Unenhanced liver is unremarkable.  Layering gallstones (series 2/image 26), without associated inflammatory changes.  Pancreas: Within normal limits.  Spleen: Lobular spleen with stable  splenic calcifications.  Adrenals/Urinary Tract: Mild left adrenal thickening. Right adrenal gland is within normal limits.  Kidneys are notable for bilateral renal vascular calcifications.  No renal, ureteral, or bladder calculi.  Mildly thick-walled bladder. Associated tiny foci of nondependent gas (series 2/image 70), correlate for recent catheterization/intervention.  Stomach/Bowel: Stomach is notable for a small hiatal hernia.  No evidence of bowel obstruction.  Normal appendix.  Status post left hemicolectomy with distal transverse colostomy in the left lower quadrant and long Hartmann's pouch.  Vascular/Lymphatic: Atherosclerotic calcifications of the abdominal aorta and branch vessels.  No suspicious abdominopelvic lymphadenopathy.  Reproductive: Status post hysterectomy.  No adnexal masses.  Other: No abdominopelvic ascites.  Musculoskeletal: Degenerative changes of the visualized thoracolumbar spine.  Mild superior endplate changes at Q76.  IMPRESSION: Mildly thick-walled bladder, likely related to known cystitis.  No renal, ureteral, or bladder calculi.  No hydronephrosis.  Additional stable ancillary findings as above.   Electronically Signed   By: Julian Hy M.D.   On: 02/16/2015 18:34    Scheduled Meds: . aspirin EC  81 mg Oral Daily  . ceFEPime (MAXIPIME) IV  1 g Intravenous Q12H  . enoxaparin (LOVENOX) injection  40 mg Subcutaneous Q24H  . gabapentin  200 mg Oral TID  . insulin aspart  0-9 Units Subcutaneous TID WC  . metoprolol tartrate  12.5 mg Oral BID  . QUEtiapine  25 mg Oral QHS  . sodium chloride  3 mL Intravenous Q12H  . vancomycin  1,000 mg Intravenous Q24H   Continuous Infusions:     Time spent: [place], [facility] Pager [phone] If 7PM-7AM, please contact night-coverage at [email or web address], password [name] 02/18/2015, 1:45 PM  LOS: 2 days

## 2015-02-18 NOTE — Progress Notes (Signed)
PIV was assessed and found to be intact. GBR and flushed without infiltration pt denies having any discomfort when flushing. Saline lock was changed due to blood being in the tubing. PIV redressed and IVF hooked back up to pt's PIV going at 31ml/hr. [employer]

## 2015-02-18 NOTE — Care Management Note (Signed)
Case Management Note  Patient Details  Name: RHETA HEMMELGARN MRN: [id number] Date of Birth: 02-Feb-1930  Subjective/Objective:       Pt admitted with Recurrent UTI             Action/Plan:Pt was active with [facility]   Expected Discharge Date:                 Expected Discharge Plan:  [place]  In-House Referral:  Clinical Social Work  Discharge planning Services  CM Consult  Post Acute Care Choice:    Choice offered to:     DME Arranged:    DME Agency:     HH Arranged:    [place] Agency:  [place]  Status of Service:  In process, will continue to follow  Medicare Important Message Given:    Date Medicare IM Given:    Medicare IM give by:    Date Additional Medicare IM Given:    Additional Medicare Important Message give by:     If discussed at [place] of Stay Meetings, dates discussed:    Additional CommentsPurcell Mouton, RN 02/18/2015, 4:05 PM

## 2015-02-18 NOTE — Care Management Note (Signed)
Case Management Note  Patient Details  Name: [name] MRN: [id number] Date of Birth: Feb 16, 1930  Subjective/Objective:                    Action/Plan:   Expected Discharge Date:  02/18/15               Expected Discharge Plan:  Skilled Nursing Facility  In-House Referral:  Clinical Social Work  Discharge planning Services  CM Consult  Post Acute Care Choice:    Choice offered to:     DME Arranged:    DME Agency:     HH Arranged:    [place] Agency:     Status of Service:  In process, will continue to follow  Medicare Important Message Given:    Date Medicare IM Given:    Medicare IM give by:    Date Additional Medicare IM Given:    Additional Medicare Important Message give by:     If discussed at [place] of Stay Meetings, dates discussed:    Additional CommentsPurcell Mouton, RN 02/18/2015, 9:07 AM

## 2015-02-18 NOTE — Clinical Social Work Note (Signed)
Clinical Social Work Assessment  Patient Details  Name: [name] MRN: [id number] Date of Birth: 1930-03-01  Date of referral:  02/18/15               Reason for consult:  Discharge Planning                Permission sought to share information with:  Family Supports Permission granted to share information::  No (pt alert and oriented to person on- have to contact pt daughter, [name] to complete assessment)  Name::     [name]  Agency::     Relationship::  daughter  Contact Information:  [phone]  Housing/Transportation Living arrangements for the past 2 months:  Single Family Home Source of Information:  Adult Children Patient Interpreter Needed:  None Criminal Activity/Legal Involvement Pertinent to Current Situation/Hospitalization:  No - Comment as needed Significant Relationships:  Adult Children Lives with:  Adult Children Do you feel safe going back to the place where you live?  Yes Need for family participation in patient care:  Yes (Comment) (pt alert and oriented to person only)  Care giving concerns:  Pt from home with daughter, [name]. PT recommending SNF upon discharge.   Social Worker assessment / plan:  CSW received referral for New SNF.   CSW visited pt room. No family present at bedside. Pt confused at this time; yelling out "mother".   CSW contacted pt daughter, [name] via telephone. CSW introduced self and explained role. Pt daughter reports that pt lives at home with her and pt daughter is pt primary caregiver. Pt daughter reports that pt is wheelchair bound at baseline.   CSW discussed with pt daughter PT recommendation for SNF upon discharge. Pt daughter declined SNF stating that pt recently went to SNF and cried the whole time at the facility. Pt daughter reports that she feels that she can manage pt care with the assistance of home health from [facility] care and states that pt and pt daughter are moving into a new house in a few days. CSW discussed  with pt daughter that pt required a lift for transfers and pt daughter states that she is aware and hopeful to get a lift at their new home. Pt daughter feels comfortable with plan for pt to return home with home health services and states that pt will need ambulance transport home.   CSW updated RNCM that pt daughter declining SNF and wants home health services at home.   CSW to continue to follow to provide support and assist with ambulance transport home when pt medically stable for discharge.   Employment status:  Retired Nurse, adult PT Recommendations:  [place] / Referral to community resources:  Other (Comment Required) (Referral to Home Health as pt daughter declining SNF)  Patient/Family's Response to care:  Pt daughter supportive and actively involved in pt care. Pt daughter feels that pt returning home is best plan for pt as pt daughter has admitted pt to a SNF in the past and pt did not adjust well to facility.   Patient/Family's Understanding of and Emotional Response to Diagnosis, Current Treatment, and Prognosis:  Pt daughter understanding of current diagnosis and treatment plan. Pt daughter reports that pt displays increased confusion in the precense of a UTI. Pt daughter states that pt is most calm when pt daughter present and pt daughter reports that she has been staying the nights in the hospital with the pt.   Emotional  Assessment Appearance:  Appears stated age Attitude/Demeanor/Rapport:    Affect (typically observed):  Other (pt confused, yelling out "mother") Orientation:  Oriented to Self Alcohol / Substance use:  Not Applicable Psych involvement (Current and /or in the community):  No (Comment)  Discharge Needs  Concerns to be addressed:  Discharge Planning Concerns Readmission within the last 30 days:  Yes Current discharge risk:  None Barriers to Discharge:  Continued Medical Work up   Lenard Simmer [phone]

## 2015-02-18 NOTE — Care Management Note (Signed)
Case Management Note  Patient Details  Name: KARRINE KLUTTZ MRN: [id number] Date of Birth: 1929/11/13  Subjective/Objective:   81yo pt admitted with UTI, AMS               Action/Plan: Plan to return back to SNF   Expected Discharge Date:               Expected Discharge Plan:  Skilled Nursing Facility  In-House Referral:  Clinical Social Work  Discharge planning Services  CM Consult  Post Acute Care Choice:    Choice offered to:     DME Arranged:    DME Agency:     HH Arranged:    [place] Agency:     Status of Service:  In process, will continue to follow  Medicare Important Message Given:    Date Medicare IM Given:    Medicare IM give by:    Date Additional Medicare IM Given:    Additional Medicare Important Message give by:     If discussed at [place] of Stay Meetings, dates discussed:    Additional CommentsPurcell Mouton, RN 02/18/2015, 9:04 AM

## 2015-02-19 LAB — GLUCOSE, CAPILLARY
GLUCOSE-CAPILLARY: 123 mg/dL — AB (ref 65–99)
GLUCOSE-CAPILLARY: 156 mg/dL — AB (ref 65–99)
Glucose-Capillary: 121 mg/dL — ABNORMAL HIGH (ref 65–99)
Glucose-Capillary: 167 mg/dL — ABNORMAL HIGH (ref 65–99)

## 2015-02-19 LAB — URINE CULTURE: Colony Count: >=100000

## 2015-02-19 MED ORDER — CEFTRIAXONE SODIUM IN DEXTROSE 20 MG/ML IV SOLN
1.0000 g | INTRAVENOUS | Status: DC
  Administered 2015-02-19 – 2015-02-20 (×2): 1 g via INTRAVENOUS
  Filled 2015-02-19 (×2): qty 50

## 2015-02-19 NOTE — Care Management Note (Signed)
Case Management Note  Patient Details  Name: [name] MRN: [id number] Date of Birth: December 18, 1929  Subjective/Objective:  Spoke to dtr [name] about d/c plans.HHC,DME,ambulance transp home to address:[address], [place].                  Action/Plan:d/c plan home w/HHC-[facility] start of care on 02/21/15.   Expected Discharge Date:  02/18/15               Expected Discharge Plan:  [place] (Per dtr [name]-decline SNF-want home w/HHC.[place] [facility]., & dme)  In-House Referral:  Clinical Social Work  Discharge planning Services  CM Consult  Post Acute Care Choice:    Choice offered to:  Adult Children  DME Arranged:  [place] (hoyer lift) DME Agency:  [place]. [employer] rep aware of dme orders,& delivery to home.)  HH Arranged:  Therapist, sports, PT, OT, Nurse's Aide (Education officer, museum) [place]:  [place] ([facility] tel#[phone]/fax#1 [place])  Status of Service:  In process, will continue to follow  Medicare Important Message Given:  Yes Date Medicare IM Given:  02/19/15 Medicare IM give by:  Dessa Phi Date Additional Medicare IM Given:    Additional Medicare Important Message give by:     If discussed at [place] of Stay Meetings, dates discussed:    Additional Comments:  Dessa Phi, RN 02/19/2015, 1:04 PM

## 2015-02-19 NOTE — Progress Notes (Signed)
Occupational Therapy Treatment Patient Details Name: CHARLYNN SALIH MRN: [id number] DOB: 1929-12-08 Today's Date: 02/19/2015    History of present illness Myla CHRISHAWNA FARINA is a 81 y.o. female has a past medical history significant for COPD, diastolic CHF, recurrent UTIs, PAF, CVA with residual right hemiparesis presents to the hospital brought by her daughter with worsening mental status and intermittent hallucinations, admitted for recurrent UTIs   OT comments  Pt participated in washing her face but became agitated with more requests to participate in additional ADL tasks. Attempted to calm pt and eventually she was able to participate in conversation but declining any further tasks right now. Will continue to follow on acute if pt willing to participate more.    Follow Up Recommendations  SNF;Supervision/Assistance - 24 hour    Equipment Recommendations  Other (comment) (TBA next venue)    Recommendations for Other Services      Precautions / Restrictions Precautions Precautions: Fall Precaution Comments: L colostomy Restrictions Weight Bearing Restrictions: No       Mobility Bed Mobility               General bed mobility comments: pt declining working on mobility at this time.  Transfers                      Balance                                   ADL       Grooming: Wash/dry face;Brushing hair;Minimal assistance;Bed level                                 General ADL Comments: Pt set up with washcloth for washing her face and completed task upon initial command. Requested that pt then comb her hair and she combed L side but has difficulty reaching around to R side and declined therapist to assist her. She would require min assist if agreeable to thoroughly comb hair on all sides. Pt stating, "I will let you know if I want help." Asked pt if she would like to brush her teeth but she states she has already done so today and  became agitated with therapist for asking if she had already brushed teeth today. Pt also declining to perform any of her bath although nursing tech states night shift did wash her off. Later in session pt then pleasant again and smiling. Nursing tech and MD made aware that pt with limited participation during OT today.       Vision                     Perception     Praxis      Cognition   Behavior During Therapy: Agitated Overall Cognitive Status: No family/caregiver present to determine baseline cognitive functioning                       Extremity/Trunk Assessment               Exercises     Shoulder Instructions       General Comments      Pertinent Vitals/ Pain       Pain Assessment: No/denies pain  Home Living  Prior Functioning/Environment              Frequency Min 2X/week     Progress Toward Goals  OT Goals(current goals can now be found in the care plan section)  Progress towards OT goals: Not progressing toward goals - comment (pt agitated during session with requests by OT to particpate in ADL)     Plan Discharge plan remains appropriate    Co-evaluation                 End of Session     Activity Tolerance Treatment limited secondary to agitation   Patient Left in bed;with call bell/phone within reach;with bed alarm set   Nurse Communication          Time: 2761-4709 OT Time Calculation (min): 13 min  Charges: OT General Charges $OT Visit: 1 Procedure OT Treatments $Self Care/Home Management : 8-22 mins  Jules Schick  [phone] 02/19/2015, 10:16 AM

## 2015-02-19 NOTE — Progress Notes (Signed)
[facility] PROGRESS NOTE  [name] VOZ:[id number] DOB: 19-Feb-1930 DOA: 02/16/2015 PCP: [name], MD  Brief narrative 81 year old female with history of dementia, CVA with residual right hemiparesis, chronic diastolic CHF, COPD, hypertension, paroxysmal A. fib (not on an deformation), ongoing tobacco use, colon cancer status post partial colectomy with colostomy, nonambulatory, recurrent UTI who was recently hospitalized 3 weeks back with altered mental status secondary to community acquired pneumonia and catheter associated UTI and has currently been living with her daughter presented to [facility] [place] on 5/22 with acute encephalopathy. Patient was discharged home from skilled nursing facility recently and had a chronic indwelling Foley which was removed at the nursing facility 2 weeks back upon discharge. As per daughter patient has mild dementia and is able to carry out a decent conversation but for the past 2 days prior to admission she was repeating herself, yelling for help and complaining of lower abdominal and found to have foul-smelling urine. She also had poor appetite and was increasingly sleepy. No history of fever, nausea, vomiting or diarrhea. Patient admitted to hospital service for acute encephalopathy secondary to UTI.  Assessment/Plan: Acute encephalopathy secondary to recurrent UTI On empiric cefepime based on prior culture. Urine culture growing Escherichia coli sensitive to rocephin, changed to rocephin. Patient is afebrile and WBC stable.   -Blood culture 1/2 from admission growing gram-positive cocci which I think is contaminant. Awaiting final culture report.    Hypokalemia Replenished  Paroxysmal A. fib Continue aspirin and metoprolol. Currently in sinus rhythm. High CHADS2 vasc score but patient not on anticoagulation likely due to fall risk.  Essential hypertension Stable on metoprolol  Dementia Mild as per daughter. Seroquel at  bedtime.  History of CVA with residual right hemiparesis Continue aspirin  Stage II sacral decubitus ulcer Wound care consulted. Recommended therapeutic mattress    seen by PT and recommend skilled nursing facility, but daughter wants to take her home.   DVT prophylaxis: Subcutaneous Lovenox Diet: Heart healthy   Code Status: DO NOT RESUSCITATE Family Communication: Spoke with daughter on the phone. Disposition Plan: PT recommends skilled nursing facility but daughter wants to take her home  If mental status improves to baseline can be discharged on 5/26.  Consultants:  None  Procedures:  CT head   CT renal  Antibiotics:  IV cefepime 5/22-  HPI/Subjective: She appears better today, denies any new complaints.   Objective: Filed Vitals:   02/19/15 1343  BP: 156/38  Pulse: 68  Temp: 98.4 F (36.9 C)  Resp: 18    Intake/Output Summary (Last 24 hours) at 02/19/15 1713 Last data filed at 02/19/15 1330  Gross per 24 hour  Intake   1210 ml  Output    100 ml  Net   1110 ml   Filed Weights   02/16/15 1841  Weight: 68.1 kg (150 lb 2.1 oz)    Exam:   General:  Elderly female lying in bed ,comfortable.   HEENT: No pallor, moist oral mucosa, supple neck  Chest: Clear to auscultation bilaterally, no added sounds  CVS: Normal S1 and S2, no murmurs rub or gallop  GI: Soft, nondistended, ileostomy bag in place, bowel sounds present  Musculoskeletal: Warm, no edema  CNS: Right hemiparesis, confused and disoriented. Slightly improved from yesterday.    Data Reviewed: Basic Metabolic Panel:  Recent Labs Lab 02/16/15 1400 02/17/15 0800  NA 143 143  K 3.3* 4.5  CL 105 107  CO2 27 27  GLUCOSE 177* 168*  BUN  24* 25*  CREATININE 1.01* 1.00  CALCIUM 8.9 8.7*   Liver Function Tests:  Recent Labs Lab 02/16/15 1400  AST 14*  ALT 9*  ALKPHOS 52  BILITOT 0.3  PROT 6.7  ALBUMIN 2.9*   No results for input(s): LIPASE, AMYLASE in the last 168  hours. No results for input(s): AMMONIA in the last 168 hours. CBC:  Recent Labs Lab 02/16/15 1400 02/17/15 0800  WBC 9.6 9.2  NEUTROABS 6.0  --   HGB 11.3* 11.7*  HCT 37.1 38.1  MCV 91.4 92.7  PLT 429* 375   Cardiac Enzymes: No results for input(s): CKTOTAL, CKMB, CKMBINDEX, TROPONINI in the last 168 hours. BNP (last 3 results) No results for input(s): BNP in the last 8760 hours.  ProBNP (last 3 results) No results for input(s): PROBNP in the last 8760 hours.  CBG:  Recent Labs Lab 02/18/15 1634 02/18/15 2111 02/19/15 0759 02/19/15 1135 02/19/15 1627  GLUCAP 124* 148* 121* 167* 123*    Recent Results (from the past 240 hour(s))  Urine culture     Status: None   Collection Time: 02/16/15  1:30 PM  Result Value Ref Range Status   Specimen Description URINE, CATHETERIZED  Final   Special Requests NONE  Final   Colony Count   Final    >=100,000 COLONIES/ML Performed at [facility]    Culture   Final    ESCHERICHIA COLI Performed at [facility]    Report Status 02/19/2015 FINAL  Final   Organism ID, Bacteria ESCHERICHIA COLI  Final      Susceptibility   Escherichia coli - MIC*    AMPICILLIN >=32 RESISTANT Resistant     CEFAZOLIN >=64 RESISTANT Resistant     CEFTRIAXONE <=1 SENSITIVE Sensitive     CIPROFLOXACIN >=4 RESISTANT Resistant     GENTAMICIN >=16 RESISTANT Resistant     LEVOFLOXACIN >=8 RESISTANT Resistant     NITROFURANTOIN <=16 SENSITIVE Sensitive     TOBRAMYCIN 8 INTERMEDIATE Intermediate     TRIMETH/SULFA <=20 SENSITIVE Sensitive     PIP/TAZO >=128 RESISTANT Resistant     * ESCHERICHIA COLI  Culture, blood (routine x 2)     Status: None (Preliminary result)   Collection Time: 02/16/15  3:30 PM  Result Value Ref Range Status   Specimen Description BLOOD LEFT FOREARM  5 ML IN [facility] BOTTLE  Final   Special Requests NONE  Final   Culture   Final    STAPHYLOCOCCUS SPECIES Note: Gram Stain Report Called to,Read Back By and  Verified With: RI ORAETVUNAM 02/17/15@1430  BY PARDA Performed at [facility]    Report Status PENDING  Incomplete  Culture, blood (routine x 2)     Status: None (Preliminary result)   Collection Time: 02/16/15  3:40 PM  Result Value Ref Range Status   Specimen Description BLOOD RIGHT HAND  5 ML IN [facility] BOTTLE  Final   Special Requests NONE  Final   Culture   Final           BLOOD CULTURE RECEIVED NO GROWTH TO DATE CULTURE WILL BE HELD FOR 5 DAYS BEFORE ISSUING A FINAL NEGATIVE REPORT Performed at [facility]    Report Status PENDING  Incomplete     Studies: No results found.  Scheduled Meds: . aspirin EC  81 mg Oral Daily  . cefTRIAXone (ROCEPHIN)  IV  1 g Intravenous Q24H  . enoxaparin (LOVENOX) injection  40 mg Subcutaneous Q24H  . gabapentin  200  mg Oral TID  . insulin aspart  0-9 Units Subcutaneous TID WC  . metoprolol tartrate  12.5 mg Oral BID  . QUEtiapine  25 mg Oral QHS  . sodium chloride  3 mL Intravenous Q12H   Continuous Infusions:     Time spent: [facility] Pager [phone] If 7PM-7AM, please contact night-coverage at [email or web address], password [facility] 02/19/2015, 5:13 PM  LOS: 3 days

## 2015-02-20 DIAGNOSIS — L8990 Pressure ulcer of unspecified site, unspecified stage: Secondary | ICD-10-CM | POA: Insufficient documentation

## 2015-02-20 LAB — CULTURE, BLOOD (ROUTINE X 2)

## 2015-02-20 LAB — GLUCOSE, CAPILLARY
GLUCOSE-CAPILLARY: 104 mg/dL — AB (ref 65–99)
Glucose-Capillary: 177 mg/dL — ABNORMAL HIGH (ref 65–99)
Glucose-Capillary: 130 mg/dL — ABNORMAL HIGH (ref 65–99)
Glucose-Capillary: 174 mg/dL — ABNORMAL HIGH (ref 65–99)

## 2015-02-20 MED ORDER — SULFAMETHOXAZOLE-TRIMETHOPRIM 800-160 MG PO TABS
1.0000 | ORAL_TABLET | Freq: Two times a day (BID) | ORAL | Status: DC
Start: 2015-02-20 — End: 2015-02-21

## 2015-02-20 NOTE — Progress Notes (Signed)
Physical Therapy Treatment Patient Details Name: [name] MRN: [id number] DOB: 03-18-1930 Today's Date: 02/20/2015    History of Present Illness [name] is a 81 y.o. female has a past medical history significant for COPD, diastolic CHF, recurrent UTIs, PAF, CVA with residual right hemiparesis presents to the hospital brought by her daughter with worsening mental status and intermittent hallucinations, admitted for recurrent UTIs    PT Comments    On arrival sitter in room and daughter applying lip stick to pt.  Daughter stated pt has not walked for 3 years.  Also stated pt has a hospital bed.  Daughter lives with pt and provides 24/7 care.  Daughter showed me how she assists pt with rolling, repositioning and how she gets pt to sit EOB.  Daughter stated, prior she was able to "pick up" pt and place her in her wheelchair but now plans to get a lift.  They plan to D/C to their new Senior [facility] Apartment tomorrow via ambulance.   Follow Up Recommendations  Supervision/Assistance - 24 hour     Equipment Recommendations   (has all equipment already) hospital bed/wheelchair Stated she was planing to get a lift  Recommendations for Other Services       Precautions / Restrictions Precautions Precautions: Fall Precaution Comments: L colostomy, R hemiparesis s/p CVA, non amb past 3 years Restrictions Weight Bearing Restrictions: No    Mobility  Bed Mobility Overal bed mobility: Needs Assistance Bed Mobility: Rolling;Sidelying to Sit;Supine to Sit;Sit to Supine;Sit to Sidelying Rolling: Total assist Sidelying to sit: Total assist Supine to sit: Total assist Sit to supine: Total assist Sit to sidelying: Total assist General bed mobility comments: daughter showed me how she turns and cleans pt, positions and how she sits her up and lays pt back down.  All Total assist and "sometimes she fights". Daughter stated "this is how I do it".  Daughter sat pt EOB.  Once upright,  pt was able to support self static sitting approx 2 min beofre c/o and requesting to lay back down.  Daughter states, she turns pt offten for pressure relief and performs freq skin checks.  Transfers                    Ambulation/Gait                 Stairs            Wheelchair Mobility    Modified Rankin (Stroke Patients Only)       Balance                                    Cognition Arousal/Alertness: Awake/alert Behavior During Therapy: WFL for tasks assessed/performed (at baseline per daughter) Overall Cognitive Status: Within Functional Limits for tasks assessed                      Exercises      General Comments        Pertinent Vitals/Pain Pain Assessment: No/denies pain    Home Living                      Prior Function            PT Goals (current goals can now be found in the care plan section) Progress towards PT goals: Progressing toward goals    Frequency  Min 2X/week    PT Plan  (pt plans to return home with daughter 24/7 assist as prior to their new handicapped Senior Apartment)    Co-evaluation             End of Session   Activity Tolerance: Patient tolerated treatment well Patient left: in bed;with call bell/phone within reach;with family/visitor present;with nursing/sitter in room     Time: 1420-1435 PT Time Calculation (min) (ACUTE ONLY): 15 min  Charges:  $Therapeutic Activity: 8-22 mins                    G Codes:      [name]  PTA [facility]  Acute  Rehab Pager      [phone]

## 2015-02-20 NOTE — Discharge Summary (Signed)
Physician Discharge Summary  [name] IFO:[id number] DOB: 05-Jan-1930 DOA: 02/16/2015  PCP: [name], MD  Admit date: 02/16/2015 Discharge date: 02/21/2015  Time spent: 30 minutes  Recommendations for Outpatient Follow-up:  1. Follow upw ith PCP in one week.   Discharge Diagnoses:  Principal Problem:   Recurrent UTI Active Problems:   Paroxysmal atrial fibrillation   Hypertension   Anemia   Acute encephalopathy   Dementia   H/O: CVA (cerebrovascular accident)- with R Hemiparesis/contractures   Hypokalemia   Tobacco abuse   DNR (do not resuscitate)   Discharge Condition: improved.   Diet recommendation: heart healthy  Filed Weights   02/16/15 1841  Weight: 68.1 kg (150 lb 2.1 oz)    History of present illness:  81 year old female with history of dementia, CVA with residual right hemiparesis, chronic diastolic CHF, COPD, hypertension, paroxysmal A. fib (not on an deformation), ongoing tobacco use, colon cancer status post partial colectomy with colostomy, nonambulatory, recurrent UTI who was recently hospitalized 3 weeks back with altered mental status secondary to community acquired pneumonia and catheter associated UTI and has currently been living with her daughter presented to [facility] [place] on 5/22 with acute encephalopathy. Patient was discharged home from skilled nursing facility recently and had a chronic indwelling Foley which was removed at the nursing facility 2 weeks back upon discharge. As per daughter patient has mild dementia and is able to carry out a decent conversation but for the past 2 days prior to admission she was repeating herself, yelling for help and complaining of lower abdominal and found to have foul-smelling urine. She also had poor appetite and was increasingly sleepy. No history of fever, nausea, vomiting or diarrhea. Patient admitted to hospital service for acute encephalopathy secondary to UTI.  Hospital Course:  Acute encephalopathy  secondary to recurrent UTI On empiric cefepime based on prior culture. Urine culture growing Escherichia coli sensitive to rocephin, changed to rocephin. Patient is afebrile and WBC stable. Completed the course of antibiotics.   -Blood culture 1/2 from admission growing gram-positive cocci which I think is contaminant. - final culture report shows coag neg staph.   Hypokalemia Replenished  Paroxysmal A. fib Continue aspirin and metoprolol. Currently in sinus rhythm. High CHADS2 vasc score but patient not on anticoagulation likely due to fall risk.  Essential hypertension Stable on metoprolol  Dementia Mild as per daughter. Seroquel at bedtime.  History of CVA with residual right hemiparesis Continue aspirin  Stage II sacral decubitus ulcer Wound care consulted. Recommended therapeutic mattress   seen by PT and recommend skilled nursing facility, but daughter wants to take her home.   Procedures:  none  Consultations: None  Discharge Exam: Filed Vitals:   02/20/15 0922  BP: 158/61  Pulse: 69  Temp:   Resp:     General: alert afebrile comfortable Cardiovascular: s1s2 Respiratory: ctab  Discharge Instructions   Discharge Instructions    Diet - low sodium heart healthy    Complete by:  As directed      Discharge instructions    Complete by:  As directed   Follow up with PCP IN ONE WEEK.          Current Discharge Medication List    START taking these medications   Details  sulfamethoxazole-trimethoprim (BACTRIM DS,SEPTRA DS) 800-160 MG per tablet Take 1 tablet by mouth 2 (two) times daily. Qty: 4 tablet, Refills: 0      CONTINUE these medications which have NOT CHANGED   Details  acetaminophen (TYLENOL) 325 MG tablet Take 2 tablets (650 mg total) by mouth every 6 (six) hours as needed for mild pain, moderate pain or fever (or Fever >/= 101).    aspirin EC 81 MG EC tablet Take 1 tablet (81 mg total) by mouth daily.    gabapentin (NEURONTIN) 100 MG  capsule Take 2 capsules (200 mg total) by mouth 3 (three) times daily.    LORazepam (ATIVAN) 0.5 MG tablet Take 0.5 mg by mouth 2 (two) times daily as needed for anxiety.    metoprolol tartrate (LOPRESSOR) 12.5 mg TABS tablet Take 0.5 tablets (12.5 mg total) by mouth 2 (two) times daily. Qty: 60 tablet, Refills: 0    nitroGLYCERIN (NITROSTAT) 0.4 MG SL tablet Place 0.4 mg under the tongue every 5 (five) minutes as needed for chest pain.    prochlorperazine (COMPAZINE) 10 MG tablet Take 10 mg by mouth every 8 (eight) hours as needed for nausea or vomiting.    QUEtiapine (SEROQUEL) 25 MG tablet Take 1 tablet by mouth at bedtime.    traMADol (ULTRAM) 50 MG tablet Take 0.5 tablets (25 mg total) by mouth every 6 (six) hours as needed. pain Qty: 30 tablet, Refills: 0       Allergies  Allergen Reactions  . Aggrenox [Aspirin-Dipyridamole Er] Diarrhea and Nausea And Vomiting    & abdominal bleeding   Follow-up Information    Please follow up.   Why:  HHRN/HHPT/HHOT/HH Nurse's aide/HH Brewing technologist information:   [facility] [phone] [place] [address] 1 [phone] fax#[phone]      Follow up with [place].   Why:  Hoyer lift;Trapeze bar   Contact information:   [address] [place] [address] [phone]        The results of significant diagnostics from this hospitalization (including imaging, microbiology, ancillary and laboratory) are listed below for reference.    Significant Diagnostic Studies: Dg Chest 2 View  01/21/2015   CLINICAL DATA:  Two day history of hallucinations. History of colon carcinoma  EXAM: CHEST  2 VIEW  COMPARISON:  June 17, 2014  FINDINGS: There is focal infiltrate in the right base. Lungs elsewhere clear. Heart is upper normal in size with pulmonary vascularity within normal limits. There is atherosclerotic change in the aorta. No adenopathy. There is anterior wedging of the T12  vertebral body.  IMPRESSION: Right base infiltrate.   Electronically Signed   By: Lowella Grip III M.D.   On: 01/21/2015 13:48   Ct Head Wo Contrast  02/16/2015   CLINICAL DATA:  Altered mental status  EXAM: CT HEAD WITHOUT CONTRAST  TECHNIQUE: Contiguous axial images were obtained from the base of the skull through the vertex without intravenous contrast.  COMPARISON:  11/06/2010, 06/20/2014  FINDINGS: Bony calvarium is intact. No gross soft tissue abnormality is noted. Diffuse atrophic changes are seen. No acute hemorrhage, acute infarction or space-occupying mass lesion are. Changes of prior right parietal lobe infarct and left occipital infarct are seen.  IMPRESSION: Chronic atrophic and ischemic changes. No acute abnormality is seen.   Electronically Signed   By: Inez Catalina M.D.   On: 02/16/2015 18:29   Dg Chest Port 1 View  02/16/2015   CLINICAL DATA:  Altered mental status.  EXAM: PORTABLE CHEST - 1 VIEW  COMPARISON:  01/21/2015.  FINDINGS: The cardiac silhouette remains borderline enlarged. Clear lungs. Diffuse osteopenia. Lower thoracic spine degenerative changes.  IMPRESSION: No acute abnormality.   Electronically Signed   By: Claudie Revering M.D.   On: 02/16/2015 14:47   Ct Renal Stone Study  02/16/2015   CLINICAL DATA:  Altered mental status, UTI, colostomy  EXAM: CT ABDOMEN AND PELVIS WITHOUT CONTRAST  TECHNIQUE: Multidetector CT imaging of the abdomen and pelvis was performed following the standard protocol without IV contrast.  COMPARISON:  06/16/2014  FINDINGS: Lower chest: Trace right pleural effusion. Mild right lower lobe atelectasis.  Cardiomegaly.  Hepatobiliary: Unenhanced liver is unremarkable.  Layering gallstones (series 2/image 26), without associated inflammatory changes.  Pancreas: Within normal limits.  Spleen: Lobular spleen with stable splenic calcifications.  Adrenals/Urinary Tract: Mild left adrenal thickening. Right adrenal gland is within normal limits.  Kidneys are  notable for bilateral renal vascular calcifications.  No renal, ureteral, or bladder calculi.  Mildly thick-walled bladder. Associated tiny foci of nondependent gas (series 2/image 70), correlate for recent catheterization/intervention.  Stomach/Bowel: Stomach is notable for a small hiatal hernia.  No evidence of bowel obstruction.  Normal appendix.  Status post left hemicolectomy with distal transverse colostomy in the left lower quadrant and long Hartmann's pouch.  Vascular/Lymphatic: Atherosclerotic calcifications of the abdominal aorta and branch vessels.  No suspicious abdominopelvic lymphadenopathy.  Reproductive: Status post hysterectomy.  No adnexal masses.  Other: No abdominopelvic ascites.  Musculoskeletal: Degenerative changes of the visualized thoracolumbar spine.  Mild superior endplate changes at K93.  IMPRESSION: Mildly thick-walled bladder, likely related to known cystitis.  No renal, ureteral, or bladder calculi.  No hydronephrosis.  Additional stable ancillary findings as above.   Electronically Signed   By: Julian Hy M.D.   On: 02/16/2015 18:34    Microbiology: Recent Results (from the past 240 hour(s))  Urine culture     Status: None   Collection Time: 02/16/15  1:30 PM  Result Value Ref Range Status   Specimen Description URINE, CATHETERIZED  Final   Special Requests NONE  Final   Colony Count   Final    >=100,000 COLONIES/ML Performed at [facility]    Culture   Final    ESCHERICHIA COLI Performed at [facility]    Report Status 02/19/2015 FINAL  Final   Organism ID, Bacteria ESCHERICHIA COLI  Final      Susceptibility   Escherichia coli - MIC*    AMPICILLIN >=32 RESISTANT Resistant     CEFAZOLIN >=64 RESISTANT Resistant     CEFTRIAXONE <=1 SENSITIVE Sensitive     CIPROFLOXACIN >=4 RESISTANT Resistant     GENTAMICIN >=16 RESISTANT Resistant     LEVOFLOXACIN >=8 RESISTANT Resistant     NITROFURANTOIN <=16 SENSITIVE Sensitive     TOBRAMYCIN 8  INTERMEDIATE Intermediate     TRIMETH/SULFA <=20 SENSITIVE Sensitive     PIP/TAZO >=128 RESISTANT Resistant     * ESCHERICHIA COLI  Culture, blood (routine x 2)     Status: None   Collection Time: 02/16/15  3:30 PM  Result Value Ref Range Status   Specimen Description BLOOD LEFT FOREARM  5 ML IN [facility] BOTTLE  Final   Special Requests NONE  Final   Culture   Final    STAPHYLOCOCCUS SPECIES (COAGULASE NEGATIVE) Note: THE SIGNIFICANCE OF ISOLATING THIS ORGANISM FROM A SINGLE SET OF BLOOD CULTURES WHEN MULTIPLE SETS ARE DRAWN IS UNCERTAIN. PLEASE NOTIFY THE MICROBIOLOGY DEPARTMENT WITHIN ONE WEEK IF SPECIATION AND SENSITIVITIES ARE REQUIRED. Note: Gram Stain Report Called to,Read Back By and Verified With: RI ORAETVUNAM 02/17/15@1430  BY PARDA Performed  at [facility]    Report Status 02/20/2015 FINAL  Final  Culture, blood (routine x 2)     Status: None (Preliminary result)   Collection Time: 02/16/15  3:40 PM  Result Value Ref Range Status   Specimen Description BLOOD RIGHT HAND  5 ML IN [facility] BOTTLE  Final   Special Requests NONE  Final   Culture   Final           BLOOD CULTURE RECEIVED NO GROWTH TO DATE CULTURE WILL BE HELD FOR 5 DAYS BEFORE ISSUING A FINAL NEGATIVE REPORT Performed at [facility]    Report Status PENDING  Incomplete     Labs: Basic Metabolic Panel:  Recent Labs Lab 02/16/15 1400 02/17/15 0800  NA 143 143  K 3.3* 4.5  CL 105 107  CO2 27 27  GLUCOSE 177* 168*  BUN 24* 25*  CREATININE 1.01* 1.00  CALCIUM 8.9 8.7*   Liver Function Tests:  Recent Labs Lab 02/16/15 1400  AST 14*  ALT 9*  ALKPHOS 52  BILITOT 0.3  PROT 6.7  ALBUMIN 2.9*   No results for input(s): LIPASE, AMYLASE in the last 168 hours. No results for input(s): AMMONIA in the last 168 hours. CBC:  Recent Labs Lab 02/16/15 1400 02/17/15 0800  WBC 9.6 9.2  NEUTROABS 6.0  --   HGB 11.3* 11.7*  HCT 37.1 38.1  MCV 91.4 92.7  PLT 429* 375   Cardiac Enzymes: No  results for input(s): CKTOTAL, CKMB, CKMBINDEX, TROPONINI in the last 168 hours. BNP: BNP (last 3 results) No results for input(s): BNP in the last 8760 hours.  ProBNP (last 3 results) No results for input(s): PROBNP in the last 8760 hours.  CBG:  Recent Labs Lab 02/19/15 0759 02/19/15 1135 02/19/15 1627 02/19/15 2116 02/20/15 0728  GLUCAP 121* 167* 123* 156* 104*       Signed:  Guinevere Stephenson  [facility] 02/20/2015, 11:36 AM

## 2015-02-20 NOTE — Progress Notes (Signed)
[facility] PROGRESS NOTE  [name] HBZ:[id number] DOB: [birth year] DOA: 02/16/2015 PCP: [name], MD  Brief narrative 81 year old female with history of dementia, CVA with residual right hemiparesis, chronic diastolic CHF, COPD, hypertension, paroxysmal A. fib (not on an deformation), ongoing tobacco use, colon cancer status post partial colectomy with colostomy, nonambulatory, recurrent UTI who was recently hospitalized 3 weeks back with altered mental status secondary to community acquired pneumonia and catheter associated UTI and has currently been living with her daughter presented to [facility] [place] on 5/22 with acute encephalopathy. Patient was discharged home from skilled nursing facility recently and had a chronic indwelling Foley which was removed at the nursing facility 2 weeks back upon discharge. As per daughter patient has mild dementia and is able to carry out a decent conversation but for the past 2 days prior to admission she was repeating herself, yelling for help and complaining of lower abdominal and found to have foul-smelling urine. She also had poor appetite and was increasingly sleepy. No history of fever, nausea, vomiting or diarrhea. Patient admitted to hospital service for acute encephalopathy secondary to UTI.  Assessment/Plan: Acute encephalopathy secondary to recurrent UTI On empiric cefepime based on prior culture. Urine culture growing Escherichia coli sensitive to rocephin, changed to rocephin. Patient is afebrile and WBC stable.   -Blood culture 1/2 from admission growing gram-positive cocci which I think is contaminant. - final culture report shows coag neg staph.   Hypokalemia Replenished  Paroxysmal A. fib Continue aspirin and metoprolol. Currently in sinus rhythm. High CHADS2 vasc score but patient not on anticoagulation likely due to fall risk.  Essential hypertension Stable on metoprolol  Dementia Mild as per daughter. Seroquel at  bedtime.  History of CVA with residual right hemiparesis Continue aspirin  Stage II sacral decubitus ulcer Wound care consulted. Recommended therapeutic mattress    seen by PT and recommend skilled nursing facility, but daughter wants to take her home.   DVT prophylaxis: Subcutaneous Lovenox Diet: Heart healthy   Code Status: DO NOT RESUSCITATE Family Communication: Spoke with daughter on the phone. Disposition Plan: PT recommends skilled nursing facility but daughter wants to take her home  If mental status improves to baseline can be discharged on 5/27.  Consultants:  None  Procedures:  CT head   CT renal  Antibiotics:  IV cefepime 5/22-  HPI/Subjective: She appears better today, denies any new complaints.   Objective: Filed Vitals:   02/20/15 0922  BP: 158/61  Pulse: 69  Temp:   Resp:     Intake/Output Summary (Last 24 hours) at 02/20/15 1840 Last data filed at 02/20/15 0827  Gross per 24 hour  Intake    293 ml  Output      0 ml  Net    293 ml   Filed Weights   02/16/15 1841  Weight: 68.1 kg (150 lb 2.1 oz)    Exam:   General:  Elderly female lying in bed ,comfortable.   HEENT: No pallor, moist oral mucosa, supple neck  Chest: Clear to auscultation bilaterally, no added sounds  CVS: Normal S1 and S2, no murmurs rub or gallop  GI: Soft, nondistended, ileostomy bag in place, bowel sounds present  Musculoskeletal: Warm, no edema  CNS: Right hemiparesis, confused and disoriented. Slightly improved from yesterday.    Data Reviewed: Basic Metabolic Panel:  Recent Labs Lab 02/16/15 1400 02/17/15 0800  NA 143 143  K 3.3* 4.5  CL 105 107  CO2 27 27  GLUCOSE  177* 168*  BUN 24* 25*  CREATININE 1.01* 1.00  CALCIUM 8.9 8.7*   Liver Function Tests:  Recent Labs Lab 02/16/15 1400  AST 14*  ALT 9*  ALKPHOS 52  BILITOT 0.3  PROT 6.7  ALBUMIN 2.9*   No results for input(s): LIPASE, AMYLASE in the last 168 hours. No results for  input(s): AMMONIA in the last 168 hours. CBC:  Recent Labs Lab 02/16/15 1400 02/17/15 0800  WBC 9.6 9.2  NEUTROABS 6.0  --   HGB 11.3* 11.7*  HCT 37.1 38.1  MCV 91.4 92.7  PLT 429* 375   Cardiac Enzymes: No results for input(s): CKTOTAL, CKMB, CKMBINDEX, TROPONINI in the last 168 hours. BNP (last 3 results) No results for input(s): BNP in the last 8760 hours.  ProBNP (last 3 results) No results for input(s): PROBNP in the last 8760 hours.  CBG:  Recent Labs Lab 02/19/15 1627 02/19/15 2116 02/20/15 0728 02/20/15 1146 02/20/15 1654  GLUCAP 123* 156* 104* 177* 130*    Recent Results (from the past 240 hour(s))  Urine culture     Status: None   Collection Time: 02/16/15  1:30 PM  Result Value Ref Range Status   Specimen Description URINE, CATHETERIZED  Final   Special Requests NONE  Final   Colony Count   Final    >=100,000 COLONIES/ML Performed at [facility]    Culture   Final    ESCHERICHIA COLI Performed at [facility]    Report Status 02/19/2015 FINAL  Final   Organism ID, Bacteria ESCHERICHIA COLI  Final      Susceptibility   Escherichia coli - MIC*    AMPICILLIN >=32 RESISTANT Resistant     CEFAZOLIN >=64 RESISTANT Resistant     CEFTRIAXONE <=1 SENSITIVE Sensitive     CIPROFLOXACIN >=4 RESISTANT Resistant     GENTAMICIN >=16 RESISTANT Resistant     LEVOFLOXACIN >=8 RESISTANT Resistant     NITROFURANTOIN <=16 SENSITIVE Sensitive     TOBRAMYCIN 8 INTERMEDIATE Intermediate     TRIMETH/SULFA <=20 SENSITIVE Sensitive     PIP/TAZO >=128 RESISTANT Resistant     * ESCHERICHIA COLI  Culture, blood (routine x 2)     Status: None   Collection Time: 02/16/15  3:30 PM  Result Value Ref Range Status   Specimen Description BLOOD LEFT FOREARM  5 ML IN [facility] BOTTLE  Final   Special Requests NONE  Final   Culture   Final    STAPHYLOCOCCUS SPECIES (COAGULASE NEGATIVE) Note: THE SIGNIFICANCE OF ISOLATING THIS ORGANISM FROM A SINGLE SET OF BLOOD  CULTURES WHEN MULTIPLE SETS ARE DRAWN IS UNCERTAIN. PLEASE NOTIFY THE MICROBIOLOGY DEPARTMENT WITHIN ONE WEEK IF SPECIATION AND SENSITIVITIES ARE REQUIRED. Note: Gram Stain Report Called to,Read Back By and Verified With: RI ORAETVUNAM 02/17/15@1430  BY PARDA Performed at [facility]    Report Status 02/20/2015 FINAL  Final  Culture, blood (routine x 2)     Status: None (Preliminary result)   Collection Time: 02/16/15  3:40 PM  Result Value Ref Range Status   Specimen Description BLOOD RIGHT HAND  5 ML IN [facility] BOTTLE  Final   Special Requests NONE  Final   Culture   Final           BLOOD CULTURE RECEIVED NO GROWTH TO DATE CULTURE WILL BE HELD FOR 5 DAYS BEFORE ISSUING A FINAL NEGATIVE REPORT Performed at [facility]    Report Status PENDING  Incomplete     Studies:  No results found.  Scheduled Meds: . aspirin EC  81 mg Oral Daily  . cefTRIAXone (ROCEPHIN)  IV  1 g Intravenous Q24H  . enoxaparin (LOVENOX) injection  40 mg Subcutaneous Q24H  . gabapentin  200 mg Oral TID  . insulin aspart  0-9 Units Subcutaneous TID WC  . metoprolol tartrate  12.5 mg Oral BID  . QUEtiapine  25 mg Oral QHS  . sodium chloride  3 mL Intravenous Q12H   Continuous Infusions:     Time spent: [facility] Pager [phone] If 7PM-7AM, please contact night-coverage at [email or web address], password [email or web address] 02/20/2015, 6:40 PM  LOS: 4 days

## 2015-02-21 LAB — GLUCOSE, CAPILLARY: Glucose-Capillary: 104 mg/dL — ABNORMAL HIGH (ref 65–99)

## 2015-02-21 MED ORDER — SULFAMETHOXAZOLE-TRIMETHOPRIM 800-160 MG PO TABS: 1.0000 | ORAL_TABLET | Freq: Two times a day (BID) | ORAL | Status: DC

## 2015-02-21 NOTE — Progress Notes (Signed)
Pt for discharge to Home with pt daughter and home health services. Pt needing ambulance transport home per RN.   CSW met with pt daughter at bedside and confirmed address. CSW spoke with RN and arranged ambulance transport for pt via [facility] to home.   No further social work needs identified at this time.  CSW signing off.   Alison Murray, MSW, [place] Work [phone]

## 2015-02-21 NOTE — Progress Notes (Signed)
Patient discharged home with daughter, discharge instructions given and explained to patient/daughter and they verbalized understanding, denies any pain/distress. No pressure ulcer noted but  wound noted but moisture associated skin damage from urine incontinence, daughter to continue care at home with home health care as well. Transported home via ambulance.

## 2015-02-21 NOTE — Care Management Note (Signed)
Case Management Note  Patient Details  Name: [name] MRN: [id number] Date of Birth: 1930/02/17  Subjective/Objective:  Patient d/c to dtr's address:[address] per CM prior note.[place] home care aware of address for [facility] services,[facility] dme rep [facility] aware of address for DME delivery.                  Action/Plan: d/c home w/HHC.d/c ambulance transp.   Expected Discharge Date:  02/18/15               Expected Discharge Plan:  [place] (Per dtr [name]-decline SNF-want home w/HHC.[facility]., & dme)  In-House Referral:  Clinical Social Work  Discharge planning Services  CM Consult  Post Acute Care Choice:    Choice offered to:  Adult Children  DME Arranged:  [place] (hoyer lift) DME Agency:  [place]. [employer] rep aware of dme orders,& delivery to home.)  HH Arranged:  Therapist, sports, PT, OT, Nurse's Aide (Education officer, museum) [place]:  [place] ([place] tel#1 [phone]/fax#1 [phone])  Status of Service:  Completed, signed off  Medicare Important Message Given:  Yes Date Medicare IM Given:  02/19/15 Medicare IM give by:  Dessa Phi Date Additional Medicare IM Given:    Additional Medicare Important Message give by:     If discussed at [place] of Stay Meetings, dates discussed:    Additional Comments:  Dessa Phi, RN 02/21/2015, 12:42 PM

## 2015-02-21 NOTE — Plan of Care (Signed)
Problem: Phase I Progression Outcomes Goal: OOB as tolerated unless otherwise ordered Outcome: Not Met (add Reason) Pt is bedbound  Problem: Phase II Progression Outcomes Goal: Progress activity as tolerated unless otherwise ordered Outcome: Not Met (add Reason) Pt is bedbound Goal: Discharge plan established Outcome: Completed/Met Date Met:  02/21/15 Home with daughter and HH  Problem: Phase III Progression Outcomes Goal: Voiding independently Outcome: Completed/Met Date Met:  02/21/15 Incontinent of urine Goal: Discharge plan remains appropriate-arrangements made Outcome: Completed/Met Date Met:  02/21/15 Will need ambulance transport home

## 2015-02-22 LAB — CULTURE, BLOOD (ROUTINE X 2): CULTURE: NO GROWTH

## 2015-03-27 ENCOUNTER — Encounter (HOSPITAL_COMMUNITY): Payer: Self-pay

## 2015-03-27 ENCOUNTER — Emergency Department (HOSPITAL_COMMUNITY)
Admission: EM | Admit: 2015-03-27 | Discharge: 2015-03-27 | Disposition: A | Discharge status: Home / Self Care | Payer: Medicare Other | Attending: Emergency Medicine | Admitting: Emergency Medicine

## 2015-03-27 DIAGNOSIS — Z8673 Personal history of transient ischemic attack (TIA), and cerebral infarction without residual deficits: Secondary | ICD-10-CM | POA: Diagnosis not present

## 2015-03-27 DIAGNOSIS — I10 Essential (primary) hypertension: Secondary | ICD-10-CM | POA: Diagnosis not present

## 2015-03-27 DIAGNOSIS — Z859 Personal history of malignant neoplasm, unspecified: Secondary | ICD-10-CM | POA: Insufficient documentation

## 2015-03-27 DIAGNOSIS — J449 Chronic obstructive pulmonary disease, unspecified: Secondary | ICD-10-CM | POA: Insufficient documentation

## 2015-03-27 DIAGNOSIS — Z792 Long term (current) use of antibiotics: Secondary | ICD-10-CM | POA: Insufficient documentation

## 2015-03-27 DIAGNOSIS — R110 Nausea: Secondary | ICD-10-CM | POA: Diagnosis not present

## 2015-03-27 DIAGNOSIS — Z79899 Other long term (current) drug therapy: Secondary | ICD-10-CM | POA: Diagnosis not present

## 2015-03-27 DIAGNOSIS — T83038A Leakage of other indwelling urethral catheter, initial encounter: Secondary | ICD-10-CM | POA: Diagnosis not present

## 2015-03-27 DIAGNOSIS — I4891 Unspecified atrial fibrillation: Secondary | ICD-10-CM | POA: Diagnosis not present

## 2015-03-27 DIAGNOSIS — E119 Type 2 diabetes mellitus without complications: Secondary | ICD-10-CM | POA: Diagnosis not present

## 2015-03-27 DIAGNOSIS — Y846 Urinary catheterization as the cause of abnormal reaction of the patient, or of later complication, without mention of misadventure at the time of the procedure: Secondary | ICD-10-CM | POA: Insufficient documentation

## 2015-03-27 DIAGNOSIS — Z8719 Personal history of other diseases of the digestive system: Secondary | ICD-10-CM | POA: Insufficient documentation

## 2015-03-27 DIAGNOSIS — I509 Heart failure, unspecified: Secondary | ICD-10-CM | POA: Insufficient documentation

## 2015-03-27 DIAGNOSIS — Z86010 Personal history of colonic polyps: Secondary | ICD-10-CM | POA: Insufficient documentation

## 2015-03-27 DIAGNOSIS — Z720 Tobacco use: Secondary | ICD-10-CM | POA: Insufficient documentation

## 2015-03-27 DIAGNOSIS — R51 Headache: Secondary | ICD-10-CM | POA: Diagnosis not present

## 2015-03-27 DIAGNOSIS — Z85038 Personal history of other malignant neoplasm of large intestine: Secondary | ICD-10-CM | POA: Diagnosis not present

## 2015-03-27 DIAGNOSIS — N390 Urinary tract infection, site not specified: Secondary | ICD-10-CM

## 2015-03-27 DIAGNOSIS — Z466 Encounter for fitting and adjustment of urinary device: Secondary | ICD-10-CM | POA: Diagnosis present

## 2015-03-27 DIAGNOSIS — Z7982 Long term (current) use of aspirin: Secondary | ICD-10-CM | POA: Insufficient documentation

## 2015-03-27 DIAGNOSIS — T839XXA Unspecified complication of genitourinary prosthetic device, implant and graft, initial encounter: Secondary | ICD-10-CM

## 2015-03-27 HISTORY — DX: Malignant (primary) neoplasm, unspecified: C80.1

## 2015-03-27 HISTORY — DX: Unspecified cirrhosis of liver: K74.60

## 2015-03-27 HISTORY — DX: Malignant neoplasm of colon, unspecified: C18.9

## 2015-03-27 LAB — BASIC METABOLIC PANEL
Anion gap: 10 (ref 5–15)
BUN: 27 mg/dL — AB (ref 6–20)
CALCIUM: 8.9 mg/dL (ref 8.9–10.3)
CO2: 29 mmol/L (ref 22–32)
CREATININE: 1.12 mg/dL — AB (ref 0.44–1.00)
Chloride: 99 mmol/L — ABNORMAL LOW (ref 101–111)
GFR, EST AFRICAN AMERICAN: 51 mL/min — AB (ref >60)
GFR, EST NON AFRICAN AMERICAN: 44 mL/min — AB (ref >60)
Glucose, Bld: 128 mg/dL — ABNORMAL HIGH (ref 65–99)
Potassium: 3 mmol/L — ABNORMAL LOW (ref 3.5–5.1)
SODIUM: 138 mmol/L (ref 135–145)

## 2015-03-27 LAB — URINALYSIS, ROUTINE W REFLEX MICROSCOPIC
Bilirubin Urine: NEGATIVE
GLUCOSE, UA: NEGATIVE mg/dL
Ketones, ur: NEGATIVE mg/dL
Nitrite: NEGATIVE
Protein, ur: >300 mg/dL — AB
Specific Gravity, Urine: 1.021 (ref 1.005–1.030)
UROBILINOGEN UA: 0.2 mg/dL (ref 0.0–1.0)
pH: 6 (ref 5.0–8.0)

## 2015-03-27 LAB — CBC WITH DIFFERENTIAL/PLATELET
BASOS PCT: 1 % (ref 0–1)
Basophils Absolute: 0.1 10*3/uL (ref 0.0–0.1)
EOS ABS: 0.4 10*3/uL (ref 0.0–0.7)
EOS PCT: 3 % (ref 0–5)
HEMATOCRIT: 38.6 % (ref 36.0–46.0)
HEMOGLOBIN: 12.5 g/dL (ref 12.0–15.0)
Lymphocytes Relative: 22 % (ref 12–46)
Lymphs Abs: 2.7 10*3/uL (ref 0.7–4.0)
MCH: 28.3 pg (ref 26.0–34.0)
MCHC: 32.4 g/dL (ref 30.0–36.0)
MCV: 87.5 fL (ref 78.0–100.0)
MONOS PCT: 6 % (ref 3–12)
Monocytes Absolute: 0.8 10*3/uL (ref 0.1–1.0)
NEUTROS ABS: 8.8 10*3/uL — AB (ref 1.7–7.7)
Neutrophils Relative %: 68 % (ref 43–77)
Platelets: 505 10*3/uL — ABNORMAL HIGH (ref 150–400)
RBC: 4.41 MIL/uL (ref 3.87–5.11)
RDW: 14.5 % (ref 11.5–15.5)
WBC: 12.7 10*3/uL — ABNORMAL HIGH (ref 4.0–10.5)

## 2015-03-27 LAB — URINE MICROSCOPIC-ADD ON

## 2015-03-27 MED ORDER — POTASSIUM CHLORIDE CRYS ER 20 MEQ PO TBCR
20.0000 meq | EXTENDED_RELEASE_TABLET | Freq: Once | ORAL | Status: AC
  Administered 2015-03-27: 20 meq via ORAL
  Filled 2015-03-27: qty 1

## 2015-03-27 MED ORDER — DEXTROSE 5 % IV SOLN
1.0000 g | Freq: Once | INTRAVENOUS | Status: AC
  Administered 2015-03-27: 1 g via INTRAVENOUS
  Filled 2015-03-27: qty 10

## 2015-03-27 MED ORDER — CEPHALEXIN 500 MG PO CAPS: 500.0000 mg | ORAL_CAPSULE | Freq: Two times a day (BID) | ORAL | Status: DC

## 2015-03-27 NOTE — ED Notes (Signed)
Patient arrived to the ED with a #16 foley cath with a 30 ml balloon. Only 10 ml in the balloon. Discussed with EDPA. 30 ml sterile water added to the existing foley cath since patient's home health nurse changed the foley today. Leg strap applied by EDNT

## 2015-03-27 NOTE — ED Notes (Signed)
Bed: WA06 Expected date:  Expected time:  Means of arrival:  Comments: Ems- 81 yo F, catheter

## 2015-03-27 NOTE — ED Notes (Signed)
Spoke with [name], her daughter, and she stated she would be at home for [facility] at address in chart

## 2015-03-27 NOTE — ED Notes (Signed)
Per EMS- Patient lives at home with daughter. Patient's daughter noted leaking at the foley insertion site. (bed linen wet). Patient's daughter also noted that the urine was dark and had a foul odor. Patient c/o slight generalized abdominal pain that the daughter states is her norm.

## 2015-03-27 NOTE — Discharge Instructions (Signed)
Urinary Tract Infection Urinary tract infections (UTIs) can develop anywhere along your urinary tract. Your urinary tract is your body's drainage system for removing wastes and extra water. Your urinary tract includes two kidneys, two ureters, a bladder, and a urethra. Your kidneys are a pair of bean-shaped organs. Each kidney is about the size of your fist. They are located below your ribs, one on each side of your spine. CAUSES Infections are caused by microbes, which are microscopic organisms, including fungi, viruses, and bacteria. These organisms are so small that they can only be seen through a microscope. Bacteria are the microbes that most commonly cause UTIs. SYMPTOMS  Symptoms of UTIs may vary by age and gender of the patient and by the location of the infection. Symptoms in young women typically include a frequent and intense urge to urinate and a painful, burning feeling in the bladder or urethra during urination. Older women and men are more likely to be tired, shaky, and weak and have muscle aches and abdominal pain. A fever may mean the infection is in your kidneys. Other symptoms of a kidney infection include pain in your back or sides below the ribs, nausea, and vomiting. DIAGNOSIS To diagnose a UTI, your caregiver will ask you about your symptoms. Your caregiver also will ask to provide a urine sample. The urine sample will be tested for bacteria and white blood cells. White blood cells are made by your body to help fight infection. TREATMENT  Typically, UTIs can be treated with medication. Because most UTIs are caused by a bacterial infection, they usually can be treated with the use of antibiotics. The choice of antibiotic and length of treatment depend on your symptoms and the type of bacteria causing your infection. HOME CARE INSTRUCTIONS  If you were prescribed antibiotics, take them exactly as your caregiver instructs you. Finish the medication even if you feel better after you  have only taken some of the medication.  Drink enough water and fluids to keep your urine clear or pale yellow.  Avoid caffeine, tea, and carbonated beverages. They tend to irritate your bladder.  Empty your bladder often. Avoid holding urine for long periods of time.  Empty your bladder before and after sexual intercourse.  After a bowel movement, women should cleanse from front to back. Use each tissue only once. SEEK MEDICAL CARE IF:  1. You have back pain. 2. You develop a fever. 3. Your symptoms do not begin to resolve within 3 days. SEEK IMMEDIATE MEDICAL CARE IF:  1. You have severe back pain or lower abdominal pain. 2. You develop chills. 3. You have nausea or vomiting. 4. You have continued burning or discomfort with urination. MAKE SURE YOU:  1. Understand these instructions. 2. Will watch your condition. 3. Will get help right away if you are not doing well or get worse. Document Released: 06/23/2005 Document Revised: 03/14/2012 Document Reviewed: 10/22/2011 [facility] Patient Information 2015 [place], [place]. This information is not intended to replace advice given to you by your health care provider. Make sure you discuss any questions you have with your health care provider.  Foley Catheter Care A Foley catheter is a soft, flexible tube that is placed into the bladder to drain urine. A Foley catheter may be inserted if:  You leak urine or are not able to control when you urinate (urinary incontinence).  You are not able to urinate when you need to (urinary retention).  You had prostate surgery or surgery on the genitals.  You have certain medical conditions, such as multiple sclerosis, dementia, or a spinal cord injury. If you are going home with a Foley catheter in place, follow the instructions below. TAKING CARE OF THE CATHETER 4. Wash your hands with soap and water. 5. Using mild soap and warm water on a clean washcloth:  Clean the area on your body closest  to the catheter insertion site using a circular motion, moving away from the catheter. Never wipe toward the catheter because this could sweep bacteria up into the urethra and cause infection.  Remove all traces of soap. Pat the area dry with a clean towel. For males, reposition the foreskin. 6. Attach the catheter to your leg so there is no tension on the catheter. Use adhesive tape or a leg strap. If you are using adhesive tape, remove any sticky residue left behind by the previous tape you used. 7. Keep the drainage bag below the level of the bladder, but keep it off the floor. 8. Check throughout the day to be sure the catheter is working and urine is draining freely. Make sure the tubing does not become kinked. 9. Do not pull on the catheter or try to remove it. Pulling could damage internal tissues. TAKING CARE OF THE DRAINAGE BAGS You will be given two drainage bags to take home. One is a large overnight drainage bag, and the other is a smaller leg bag that fits underneath clothing. You may wear the overnight bag at any time, but you should never wear the smaller leg bag at night. Follow the instructions below for how to empty, change, and clean your drainage bags. Emptying the Drainage Bag You must empty your drainage bag when it is  - full or at least 2-3 times a day. 5. Wash your hands with soap and water. 6. Keep the drainage bag below your hips, below the level of your bladder. This stops urine from going back into the tubing and into your bladder. 7. Hold the dirty bag over the toilet or a clean container. 8. Open the pour spout at the bottom of the bag and empty the urine into the toilet or container. Do not let the pour spout touch the toilet, container, or any other surface. Doing so can place bacteria on the bag, which can cause an infection. 9. Clean the pour spout with a gauze pad or cotton ball that has rubbing alcohol on it. 10. Close the pour spout. 11. Attach the bag to your  leg with adhesive tape or a leg strap. 12. Wash your hands well. Changing the Drainage Bag Change your drainage bag once a month or sooner if it starts to smell bad or look dirty. Below are steps to follow when changing the drainage bag. 4. Wash your hands with soap and water. 5. Pinch off the rubber catheter so that urine does not spill out. 6. Disconnect the catheter tube from the drainage tube at the connection valve. Do not let the tubes touch any surface. 7. Clean the end of the catheter tube with an alcohol wipe. Use a different alcohol wipe to clean the end of the drainage tube. 8. Connect the catheter tube to the drainage tube of the clean drainage bag. 9. Attach the new bag to the leg with adhesive tape or a leg strap. Avoid attaching the new bag too tightly. 10. Wash your hands well. Cleaning the Drainage Bag 1. Wash your hands with soap and water. 2. Wash the bag in warm, soapy  water. 3. Rinse the bag thoroughly with warm water. 4. Fill the bag with a solution of white vinegar and water (1 cup vinegar to 1 qt warm water [.2 L vinegar to 1 L warm water]). Close the bag and soak it for 30 minutes in the solution. 5. Rinse the bag with warm water. 6. Hang the bag to dry with the pour spout open and hanging downward. 7. Store the clean bag (once it is dry) in a clean plastic bag. 8. Wash your hands well. PREVENTING INFECTION  Wash your hands before and after handling your catheter.  Take showers daily and wash the area where the catheter enters your body. Do not take baths. Replace wet leg straps with dry ones, if this applies.  Do not use powders, sprays, or lotions on the genital area. Only use creams, lotions, or ointments as directed by your caregiver.  For females, wipe from front to back after each bowel movement.  Drink enough fluids to keep your urine clear or pale yellow unless you have a fluid restriction.  Do not let the drainage bag or tubing touch or lie on the  floor.  Wear cotton underwear to absorb moisture and to keep your skin drier. SEEK MEDICAL CARE IF:   Your urine is cloudy or smells unusually bad.  Your catheter becomes clogged.  You are not draining urine into the bag or your bladder feels full.  Your catheter starts to leak. SEEK IMMEDIATE MEDICAL CARE IF:   You have pain, swelling, redness, or pus where the catheter enters the body.  You have pain in the abdomen, legs, lower back, or bladder.  You have a fever.  You see blood fill the catheter, or your urine is pink or red.  You have nausea, vomiting, or chills.  Your catheter gets pulled out. MAKE SURE YOU:   Understand these instructions.  Will watch your condition.  Will get help right away if you are not doing well or get worse. Document Released: 09/13/2005 Document Revised: 01/28/2014 Document Reviewed: 09/04/2012 [facility] Patient Information 2015 [place], [place]. This information is not intended to replace advice given to you by your health care provider. Make sure you discuss any questions you have with your health care provider.

## 2015-03-27 NOTE — ED Notes (Signed)
[name]-daughter [phone]  Call if patient is admitted or a change.

## 2015-03-27 NOTE — ED Provider Notes (Signed)
CSN: [id number]     Arrival date & time 03/27/15  1537 History   First MD Initiated Contact with Patient 03/27/15 1542     Chief Complaint  Patient presents with  . foley cath leaking    [name] is a 81 y.o. female with a history of COPD, CHF, A-fib, DM, colon cancer, and frequent UTIs who presents to the emergency department complaining of urine leaking around her Foley cath since yesterday associated with foul urine odor for the past 2 days. The patient reports she has a chronic indwelling catheter due to chronic leakage and frequent UTIs. Patient has her Foley cath changed every month and it was last changed yesterday. Patient also reports she has chronic bladder spasms intermittent abdominal pain. Patient currently denies any abdominal pain. Patient reports chronic nausea and denies any vomiting. Patient has a colostomy bag and it was last changed today. She reports a good appetite. She reports her last urinary tract infection was one month ago. She reports decreased urine output today. The patient denies wrist, chills or dysuria, abdominal pain, vomiting, emesis, hematochezia, rashes, bleeding, vision changes, chest pain, cough, shortness of breath or weakness.   (Consider location/radiation/quality/duration/timing/severity/associated sxs/prior Treatment) HPI  Past Medical History  Diagnosis Date  . COPD (chronic obstructive pulmonary disease)   . CHF (congestive heart failure)     ECHO 2011  EF: 60% to 62%, grd 1 distolic dysfxn, 37-62% 8315 ECHO, tech limited by Afib  . Hypertension   . Stroke 2008    rt side deficit  . A-fib   . Back pain   . Constipation   . SBO (small bowel obstruction)     per GI note in 2011, pt denies  . Upper GI bleed 2011    required hospitalization  . Colon polyps   . Tobacco abuse   . Diabetes mellitus     per daughter doctor took patient off DM medication 6 months ago   . Cancer   . Colon cancer   . Cirrhosis of liver    Past Surgical  History  Procedure Laterality Date  . Total abdominal hysterectomy  1980  . Colonoscopy N/A 09/04/2013    Procedure: COLONOSCOPY;  Surgeon: Lear Ng, MD;  Location: [facility] ENDOSCOPY;  Service: Endoscopy;  Laterality: N/A;  . Colon resection N/A 09/06/2013    Procedure: PARTIAL COLECTOMY WITH COLOSTOMY;  Surgeon: Earnstine Regal, MD;  Location: [facility];  Service: General;  Laterality: N/A;  . Colostomy revision N/A 09/08/2013    Procedure: COLOSTOMY REVISION;  Surgeon: Gwenyth Ober, MD;  Location: [facility];  Service: General;  Laterality: N/A;  . Colostomy     Family History  Problem Relation Age of Onset  . Lung cancer Mother   . Heart disease Father    History  Substance Use Topics  . Smoking status: Current Some Day Smoker -- 0.50 packs/day for 15 years    Types: Cigarettes  . Smokeless tobacco: Never Used  . Alcohol Use: No   OB History    No data available     Review of Systems  Constitutional: Negative for fever, chills and appetite change.  HENT: Negative for congestion and sore throat.   Eyes: Negative for visual disturbance.  Respiratory: Negative for cough, shortness of breath and wheezing.   Cardiovascular: Negative for chest pain.  Gastrointestinal: Positive for nausea. Negative for vomiting, abdominal pain and diarrhea.  Genitourinary: Positive for decreased urine volume. Negative for dysuria, hematuria, flank pain and  genital sores.  Musculoskeletal: Negative for back pain and neck pain.  Skin: Negative for rash.  Neurological: Positive for headaches. Negative for dizziness, weakness, light-headedness and numbness.      Allergies  Aggrenox  Home Medications   Prior to Admission medications   Medication Sig Start Date End Date Taking? Authorizing Provider  acetaminophen (TYLENOL) 325 MG tablet Take 2 tablets (650 mg total) by mouth every 6 (six) hours as needed for mild pain, moderate pain or fever (or Fever >/= 101). 06/21/14  Yes Modena Jansky, MD   aspirin EC 81 MG EC tablet Take 1 tablet (81 mg total) by mouth daily. 01/25/15  Yes Albertine Patricia, MD  gabapentin (NEURONTIN) 100 MG capsule Take 2 capsules (200 mg total) by mouth 3 (three) times daily. 01/25/15  Yes Albertine Patricia, MD  LORazepam (ATIVAN) 0.5 MG tablet Take 0.5 mg by mouth 2 (two) times daily as needed for anxiety.   Yes Historical Provider, MD  Oxycodone HCl 10 MG TABS Take 10 mg by mouth every 8 (eight) hours as needed (pain).   Yes Historical Provider, MD  prochlorperazine (COMPAZINE) 10 MG tablet Take 10 mg by mouth every 8 (eight) hours as needed for nausea or vomiting.   Yes Historical Provider, MD  QUEtiapine (SEROQUEL) 25 MG tablet Take 25 mg by mouth at bedtime.  01/03/15  Yes Historical Provider, MD  traMADol (ULTRAM) 50 MG tablet Take 0.5 tablets (25 mg total) by mouth every 6 (six) hours as needed. pain Patient taking differently: Take 50 mg by mouth every 6 (six) hours as needed for moderate pain. pain 01/25/15  Yes Albertine Patricia, MD  cephALEXin (KEFLEX) 500 MG capsule Take 1 capsule (500 mg total) by mouth 2 (two) times daily. 03/27/15   Waynetta Pean, PA-C  metoprolol tartrate (LOPRESSOR) 12.5 mg TABS tablet Take 0.5 tablets (12.5 mg total) by mouth 2 (two) times daily. Patient not taking: Reported on 03/27/2015 06/21/14   Modena Jansky, MD  nitroGLYCERIN (NITROSTAT) 0.4 MG SL tablet Place 0.4 mg under the tongue every 5 (five) minutes as needed for chest pain.    Historical Provider, MD  sulfamethoxazole-trimethoprim (BACTRIM DS,SEPTRA DS) 800-160 MG per tablet Take 1 tablet by mouth 2 (two) times daily. Patient not taking: Reported on 03/27/2015 02/21/15   Hosie Poisson, MD   BP 150/65 mmHg  Pulse 72  Temp(Src) 97.4 F (36.3 C) (Oral)  Resp 16  SpO2 96% Physical Exam  Constitutional: She is oriented to person, place, and time. She appears well-developed and well-nourished. No distress.  Nontoxic appearing.  HENT:  Head: Normocephalic and  atraumatic.  Mouth/Throat: Oropharynx is clear and moist. No oropharyngeal exudate.  Eyes: Conjunctivae are normal. Pupils are equal, round, and reactive to light. Right eye exhibits no discharge. Left eye exhibits no discharge.  Neck: Normal range of motion. Neck supple. No JVD present. No tracheal deviation present.  Cardiovascular: Normal rate, regular rhythm, normal heart sounds and intact distal pulses.  Exam reveals no gallop and no friction rub.   No murmur heard. Pulmonary/Chest: Effort normal and breath sounds normal. No respiratory distress. She has no wheezes. She has no rales.  Abdominal: Soft. Bowel sounds are normal. She exhibits no distension. There is tenderness. There is no rebound and no guarding.  Abdomen soft. Bowel sounds are present. Patient has mild suprapubic tenderness to palpation. No peritoneal signs.  Genitourinary:  Foley cath in place. No GU rashes.   Musculoskeletal: She exhibits no edema.  Lymphadenopathy:    She has no cervical adenopathy.  Neurological: She is alert and oriented to person, place, and time. Coordination normal.  Skin: Skin is warm and dry. No rash noted. She is not diaphoretic. No erythema. No pallor.  Psychiatric: She has a normal mood and affect. Her behavior is normal.  Nursing note and vitals reviewed.   ED Course  Procedures (including critical care time) Labs Review Labs Reviewed  URINALYSIS, ROUTINE W REFLEX MICROSCOPIC (NOT AT [facility]) - Abnormal; Notable for the following:    APPearance TURBID (*)    Hgb urine dipstick MODERATE (*)    Protein, ur >300 (*)    Leukocytes, UA MODERATE (*)    All other components within normal limits  BASIC METABOLIC PANEL - Abnormal; Notable for the following:    Potassium 3.0 (*)    Chloride 99 (*)    Glucose, Bld 128 (*)    BUN 27 (*)    Creatinine, Ser 1.12 (*)    GFR calc non Af Amer 44 (*)    GFR calc Af Amer 51 (*)    All other components within normal limits  CBC WITH  DIFFERENTIAL/PLATELET - Abnormal; Notable for the following:    WBC 12.7 (*)    Platelets 505 (*)    Neutro Abs 8.8 (*)    All other components within normal limits  URINE MICROSCOPIC-ADD ON - Abnormal; Notable for the following:    Bacteria, UA MANY (*)    All other components within normal limits  URINE CULTURE    Imaging Review No results found.   EKG Interpretation None      Filed Vitals:   03/27/15 1554 03/27/15 1605 03/27/15 1937 03/27/15 2012  BP: 141/104 132/70 153/64 150/65  Pulse: 81  75 72  Temp: 97.4 F (36.3 C)     TempSrc: Oral     Resp: 18  18 16   SpO2: 98%  96% 96%     MDM   Meds given in ED:  Medications  cefTRIAXone (ROCEPHIN) 1 g in dextrose 5 % 50 mL IVPB (1 g Intravenous New Bag/Given 03/27/15 2010)  potassium chloride SA (K-DUR,KLOR-CON) CR tablet 20 mEq (20 mEq Oral Given 03/27/15 1859)    New Prescriptions   CEPHALEXIN (KEFLEX) 500 MG CAPSULE    Take 1 capsule (500 mg total) by mouth 2 (two) times daily.    Final diagnoses:  UTI (lower urinary tract infection)  Foley catheter problem, initial encounter   This is a 81 y.o. female with a history of COPD, CHF, A-fib, DM, colon cancer, and frequent UTIs who presents to the emergency department complaining of urine leaking around her Foley cath since yesterday associated with foul urine odor for the past 2 days. The patient reports she has a chronic indwelling catheter due to chronic leakage and frequent UTIs. Patient has her Foley cath changed every month and it was last changed yesterday. On exam the patient is afebrile and nontoxic-appearing. The patient's abdomen is soft and she has mild suprapubic tenderness to palpation. No vaginal rashes noted on GU exam. Patient's catheter was last changed yesterday and when it was evaluated was discovered that only 10 mL of sterile water were placed in the balloon pelvis to hold 30 mL. Patient's catheter properly placed with a 30 mL balloon and the patient no  longer had any leakage at reevaluation. Her urinalysis showed moderate leukocytes with many bacteria. Patient's recent urine culture about 2 months ago grew Escherichia coli susceptible to  Rocephin. Patient treated with a gram of Rocephin in the ED and will sent home with Keflex. Patient's CBC showed a mild laxatives with a white count of 12.7 and his otherwise unremarkable. BMP shows a potassium of 3.0 with a mildly low bicarbonate of 1.12 which is an improvement from her prior's. She given 20 mEq of oral potassium in the ED her potassium of 3.0. At reevaluation the patient is sleeping in the room. She denies any leakage from her foley cath. We'll discharge with prescription for Keflex with BID dosing due to GFR <50 and have her follow-up with her primary care provider. Next week. Also called in spoke with her daughter and gave her return precautions and the plan. She is at home waiting for her arrival. I advised the patient to follow-up with their primary care provider this week. I advised the patient to return to the emergency department with new or worsening symptoms or new concerns. The patient and her daughter verbalized understanding and agreement with plan.    This patient was discussed with and evaluated with Dr. Doy Mince who agrees with assessment and plan.    Waynetta Pean, PA-C 03/27/15 2113  Serita Grit, MD 03/27/15 [id number]

## 2015-03-27 NOTE — Progress Notes (Signed)
Patient noted to have ben recently admitted at discharged from the hospital from 5/22 to 5/27 with recurrent UTI.  Patient discharged with home ealth services with [facility] home health and hospice.  EDCM spoke to patient at bedside.  Patient confirms she lives at home with her daughter.  Patient confirms she has home health services with [facility].  Per chart review, patient has RN,PT, OT aide and social worker currently.  No further EDCM needs at this time.

## 2015-03-30 LAB — URINE CULTURE

## 2015-03-31 ENCOUNTER — Emergency Department (HOSPITAL_COMMUNITY): Payer: Medicare Other

## 2015-03-31 ENCOUNTER — Emergency Department (HOSPITAL_COMMUNITY)
Admission: EM | Admit: 2015-03-31 | Discharge: 2015-04-01 | Disposition: A | Discharge status: Home / Self Care | Payer: Medicare Other | Attending: Emergency Medicine | Admitting: Emergency Medicine

## 2015-03-31 ENCOUNTER — Encounter (HOSPITAL_COMMUNITY): Payer: Self-pay | Admitting: Emergency Medicine

## 2015-03-31 DIAGNOSIS — I509 Heart failure, unspecified: Secondary | ICD-10-CM | POA: Diagnosis not present

## 2015-03-31 DIAGNOSIS — J449 Chronic obstructive pulmonary disease, unspecified: Secondary | ICD-10-CM | POA: Diagnosis not present

## 2015-03-31 DIAGNOSIS — R109 Unspecified abdominal pain: Secondary | ICD-10-CM

## 2015-03-31 DIAGNOSIS — Z79899 Other long term (current) drug therapy: Secondary | ICD-10-CM | POA: Insufficient documentation

## 2015-03-31 DIAGNOSIS — E119 Type 2 diabetes mellitus without complications: Secondary | ICD-10-CM | POA: Diagnosis not present

## 2015-03-31 DIAGNOSIS — Z85038 Personal history of other malignant neoplasm of large intestine: Secondary | ICD-10-CM | POA: Insufficient documentation

## 2015-03-31 DIAGNOSIS — N12 Tubulo-interstitial nephritis, not specified as acute or chronic: Secondary | ICD-10-CM | POA: Diagnosis not present

## 2015-03-31 DIAGNOSIS — I10 Essential (primary) hypertension: Secondary | ICD-10-CM | POA: Insufficient documentation

## 2015-03-31 DIAGNOSIS — Z86010 Personal history of colonic polyps: Secondary | ICD-10-CM | POA: Insufficient documentation

## 2015-03-31 DIAGNOSIS — Z7982 Long term (current) use of aspirin: Secondary | ICD-10-CM | POA: Diagnosis not present

## 2015-03-31 DIAGNOSIS — Z8673 Personal history of transient ischemic attack (TIA), and cerebral infarction without residual deficits: Secondary | ICD-10-CM | POA: Insufficient documentation

## 2015-03-31 DIAGNOSIS — Z720 Tobacco use: Secondary | ICD-10-CM | POA: Diagnosis not present

## 2015-03-31 LAB — URINALYSIS, ROUTINE W REFLEX MICROSCOPIC
BILIRUBIN URINE: NEGATIVE
GLUCOSE, UA: NEGATIVE mg/dL
KETONES UR: NEGATIVE mg/dL
Nitrite: POSITIVE — AB
Specific Gravity, Urine: 1.017 (ref 1.005–1.030)
Urobilinogen, UA: 0.2 mg/dL (ref 0.0–1.0)
pH: 6 (ref 5.0–8.0)

## 2015-03-31 LAB — COMPREHENSIVE METABOLIC PANEL
ALT: 8 U/L — ABNORMAL LOW (ref 14–54)
ANION GAP: 9 (ref 5–15)
AST: 26 U/L (ref 15–41)
Albumin: 3 g/dL — ABNORMAL LOW (ref 3.5–5.0)
Alkaline Phosphatase: 61 U/L (ref 38–126)
BILIRUBIN TOTAL: 1.2 mg/dL (ref 0.3–1.2)
BUN: 26 mg/dL — ABNORMAL HIGH (ref 6–20)
CO2: 29 mmol/L (ref 22–32)
Calcium: 8.6 mg/dL — ABNORMAL LOW (ref 8.9–10.3)
Chloride: 101 mmol/L (ref 101–111)
Creatinine, Ser: 1.06 mg/dL — ABNORMAL HIGH (ref 0.44–1.00)
GFR calc non Af Amer: 47 mL/min — ABNORMAL LOW (ref >60)
GFR, EST AFRICAN AMERICAN: 55 mL/min — AB (ref >60)
GLUCOSE: 138 mg/dL — AB (ref 65–99)
Potassium: 3.5 mmol/L (ref 3.5–5.1)
Sodium: 139 mmol/L (ref 135–145)
TOTAL PROTEIN: 6.5 g/dL (ref 6.5–8.1)

## 2015-03-31 LAB — URINE MICROSCOPIC-ADD ON

## 2015-03-31 LAB — CBC WITH DIFFERENTIAL/PLATELET
BASOS PCT: 0 % (ref 0–1)
Basophils Absolute: 0 10*3/uL (ref 0.0–0.1)
Eosinophils Absolute: 0.4 10*3/uL (ref 0.0–0.7)
Eosinophils Relative: 4 % (ref 0–5)
HCT: 37.8 % (ref 36.0–46.0)
Hemoglobin: 12.1 g/dL (ref 12.0–15.0)
LYMPHS ABS: 2.8 10*3/uL (ref 0.7–4.0)
Lymphocytes Relative: 28 % (ref 12–46)
MCH: 27.9 pg (ref 26.0–34.0)
MCHC: 32 g/dL (ref 30.0–36.0)
MCV: 87.3 fL (ref 78.0–100.0)
Monocytes Absolute: 0.6 10*3/uL (ref 0.1–1.0)
Monocytes Relative: 6 % (ref 3–12)
Neutro Abs: 6.3 10*3/uL (ref 1.7–7.7)
Neutrophils Relative %: 62 % (ref 43–77)
PLATELETS: 477 10*3/uL — AB (ref 150–400)
RBC: 4.33 MIL/uL (ref 3.87–5.11)
RDW: 14.9 % (ref 11.5–15.5)
WBC: 10.1 10*3/uL (ref 4.0–10.5)

## 2015-03-31 LAB — I-STAT TROPONIN, ED: TROPONIN I, POC: 0.01 ng/mL (ref 0.00–0.08)

## 2015-03-31 LAB — LIPASE, BLOOD: LIPASE: 36 U/L (ref 22–51)

## 2015-03-31 MED ORDER — IOHEXOL 300 MG/ML  SOLN
80.0000 mL | Freq: Once | INTRAMUSCULAR | Status: AC | PRN
  Administered 2015-03-31: 80 mL via INTRAVENOUS

## 2015-03-31 MED ORDER — CEPHALEXIN 250 MG PO CAPS
500.0000 mg | ORAL_CAPSULE | Freq: Once | ORAL | Status: AC
  Administered 2015-03-31: 500 mg via ORAL
  Filled 2015-03-31: qty 2

## 2015-03-31 MED ORDER — IOHEXOL 300 MG/ML  SOLN
25.0000 mL | Freq: Once | INTRAMUSCULAR | Status: AC | PRN
  Administered 2015-03-31: 25 mL via ORAL

## 2015-03-31 NOTE — Discharge Instructions (Signed)
Pyelonephritis, Adult °Pyelonephritis is a kidney infection. In general, there are 2 main types of pyelonephritis: °· Infections that come on quickly without any warning (acute pyelonephritis). °· Infections that persist for a long period of time (chronic pyelonephritis). °CAUSES  °Two main causes of pyelonephritis are: °· Bacteria traveling from the bladder to the kidney. This is a problem especially in pregnant women. The urine in the bladder can become filled with bacteria from multiple causes, including: °¨ Inflammation of the prostate gland (prostatitis). °¨ Sexual intercourse in females. °¨ Bladder infection (cystitis). °· Bacteria traveling from the bloodstream to the tissue part of the kidney. °Problems that may increase your risk of getting a kidney infection include: °· Diabetes. °· Kidney stones or bladder stones. °· Cancer. °· Catheters placed in the bladder. °· Other abnormalities of the kidney or ureter. °SYMPTOMS  °· Abdominal pain. °· Pain in the side or flank area. °· Fever. °· Chills. °· Upset stomach. °· Blood in the urine (dark urine). °· Frequent urination. °· Strong or persistent urge to urinate. °· Burning or stinging when urinating. °DIAGNOSIS  °Your caregiver may diagnose your kidney infection based on your symptoms. A urine sample may also be taken. °TREATMENT  °In general, treatment depends on how severe the infection is.  °· If the infection is mild and caught early, your caregiver may treat you with oral antibiotics and send you home. °· If the infection is more severe, the bacteria may have gotten into the bloodstream. This will require intravenous (IV) antibiotics and a hospital stay. Symptoms may include: °¨ High fever. °¨ Severe flank pain. °¨ Shaking chills. °· Even after a hospital stay, your caregiver may require you to be on oral antibiotics for a period of time. °· Other treatments may be required depending upon the cause of the infection. °HOME CARE INSTRUCTIONS  °· Take your  antibiotics as directed. Finish them even if you start to feel better. °· Make an appointment to have your urine checked to make sure the infection is gone. °· Drink enough fluids to keep your urine clear or pale yellow. °· Take medicines for the bladder if you have urgency and frequency of urination as directed by your caregiver. °SEEK IMMEDIATE MEDICAL CARE IF:  °· You have a fever or persistent symptoms for more than 2-3 days. °· You have a fever and your symptoms suddenly get worse. °· You are unable to take your antibiotics or fluids. °· You develop shaking chills. °· You experience extreme weakness or fainting. °· There is no improvement after 2 days of treatment. °MAKE SURE YOU: °· Understand these instructions. °· Will watch your condition. °· Will get help right away if you are not doing well or get worse. °Document Released: 09/13/2005 Document Revised: 03/14/2012 Document Reviewed: 02/17/2011 °[employer]® Patient Information ©2015 [employer]. This information is not intended to replace advice given to you by your health care provider. Make sure you discuss any questions you have with your health care provider. ° °

## 2015-03-31 NOTE — ED Notes (Signed)
Patient transported to CT 

## 2015-03-31 NOTE — ED Notes (Signed)
Pt arrived by [facility] from home with daughter with c/o TIA symptoms. Pt started having a hard time getting her words out while talking with daughter. Symptoms started at 5:30pm and lasted approximately 56mins. Pt has hx of stroke with right sided weakness.

## 2015-03-31 NOTE — ED Provider Notes (Signed)
CSN: [id number]     Arrival date & time 03/31/15  1757 History   First MD Initiated Contact with Patient 03/31/15 1759     Chief Complaint  Patient presents with  . Transient Ischemic Attack     (Consider location/radiation/quality/duration/timing/severity/associated sxs/prior Treatment) Patient is a 81 y.o. female presenting with neurologic complaint.  Neurologic Problem This is a new problem. The current episode started 1 to 2 hours ago. The problem occurs constantly. The problem has been resolved. Pertinent negatives include no chest pain, no abdominal pain, no headaches and no shortness of breath. Nothing aggravates the symptoms. Nothing relieves the symptoms. She has tried nothing for the symptoms.    Past Medical History  Diagnosis Date  . COPD (chronic obstructive pulmonary disease)   . CHF (congestive heart failure)     ECHO 2011  EF: 60% to 63%, grd 1 distolic dysfxn, 87-56% 4332 ECHO, tech limited by Afib  . Hypertension   . Stroke 2008    rt side deficit  . A-fib   . Back pain   . Constipation   . SBO (small bowel obstruction)     per GI note in 2011, pt denies  . Upper GI bleed 2011    required hospitalization  . Colon polyps   . Tobacco abuse   . Diabetes mellitus     per daughter doctor took patient off DM medication 6 months ago   . Cancer   . Colon cancer   . Cirrhosis of liver    Past Surgical History  Procedure Laterality Date  . Total abdominal hysterectomy  1980  . Colonoscopy N/A 09/04/2013    Procedure: COLONOSCOPY;  Surgeon: Lear Ng, MD;  Location: [facility] ENDOSCOPY;  Service: Endoscopy;  Laterality: N/A;  . Colon resection N/A 09/06/2013    Procedure: PARTIAL COLECTOMY WITH COLOSTOMY;  Surgeon: Earnstine Regal, MD;  Location: [place];  Service: General;  Laterality: N/A;  . Colostomy revision N/A 09/08/2013    Procedure: COLOSTOMY REVISION;  Surgeon: Gwenyth Ober, MD;  Location: [place];  Service: General;  Laterality: N/A;  . Colostomy      Family History  Problem Relation Age of Onset  . Lung cancer Mother   . Heart disease Father    History  Substance Use Topics  . Smoking status: Current Some Day Smoker -- 0.50 packs/day for 15 years    Types: Cigarettes  . Smokeless tobacco: Never Used  . Alcohol Use: No   OB History    No data available     Review of Systems  Respiratory: Negative for shortness of breath.   Cardiovascular: Negative for chest pain.  Gastrointestinal: Negative for abdominal pain.  Neurological: Negative for headaches.  All other systems reviewed and are negative.     Allergies  Aggrenox  Home Medications   Prior to Admission medications   Medication Sig Start Date End Date Taking? Authorizing Provider  aspirin EC 81 MG EC tablet Take 1 tablet (81 mg total) by mouth daily. 01/25/15  Yes Albertine Patricia, MD  gabapentin (NEURONTIN) 100 MG capsule Take 2 capsules (200 mg total) by mouth 3 (three) times daily. 01/25/15  Yes Albertine Patricia, MD  LORazepam (ATIVAN) 0.5 MG tablet Take 0.5 mg by mouth 2 (two) times daily as needed for anxiety.   Yes Historical Provider, MD  nitroGLYCERIN (NITROSTAT) 0.4 MG SL tablet Place 0.4 mg under the tongue every 5 (five) minutes as needed for chest pain.   Yes Historical  Provider, MD  Oxycodone HCl 10 MG TABS Take 10 mg by mouth every 8 (eight) hours as needed (pain).   Yes Historical Provider, MD  prochlorperazine (COMPAZINE) 10 MG tablet Take 10 mg by mouth every 8 (eight) hours as needed for nausea or vomiting.   Yes Historical Provider, MD  QUEtiapine (SEROQUEL) 25 MG tablet Take 25 mg by mouth at bedtime.  01/03/15  Yes Historical Provider, MD  traMADol (ULTRAM) 50 MG tablet Take 0.5 tablets (25 mg total) by mouth every 6 (six) hours as needed. pain Patient taking differently: Take 50 mg by mouth every 6 (six) hours as needed for moderate pain. pain 01/25/15  Yes Albertine Patricia, MD  acetaminophen (TYLENOL) 325 MG tablet Take 2 tablets (650 mg  total) by mouth every 6 (six) hours as needed for mild pain, moderate pain or fever (or Fever >/= 101). Patient not taking: Reported on 03/31/2015 06/21/14   Modena Jansky, MD  cephALEXin (KEFLEX) 500 MG capsule Take 1 capsule (500 mg total) by mouth 2 (two) times daily. Patient not taking: Reported on 03/31/2015 03/27/15   Waynetta Pean, PA-C  metoprolol tartrate (LOPRESSOR) 12.5 mg TABS tablet Take 0.5 tablets (12.5 mg total) by mouth 2 (two) times daily. Patient not taking: Reported on 03/27/2015 06/21/14   Modena Jansky, MD  sulfamethoxazole-trimethoprim (BACTRIM DS,SEPTRA DS) 800-160 MG per tablet Take 1 tablet by mouth 2 (two) times daily. Patient not taking: Reported on 03/27/2015 02/21/15   Hosie Poisson, MD   BP 182/59 mmHg  Pulse 76  Temp(Src) 98.7 F (37.1 C) (Oral)  Resp 17  SpO2 96% Physical Exam  Constitutional: She is oriented to person, place, and time. She appears well-developed and well-nourished.  HENT:  Head: Normocephalic and atraumatic.  Right Ear: External ear normal.  Left Ear: External ear normal.  Eyes: Conjunctivae and EOM are normal. Pupils are equal, round, and reactive to light.  Neck: Normal range of motion. Neck supple.  Cardiovascular: Normal rate, regular rhythm, normal heart sounds and intact distal pulses.   Pulmonary/Chest: Effort normal and breath sounds normal.  Abdominal: Soft. Bowel sounds are normal. There is no tenderness.  Musculoskeletal: Normal range of motion.  Neurological: She is alert and oriented to person, place, and time.  R partial spastic hemiparesis with some residual strength (1/5), L side without deficits  Skin: Skin is warm and dry.  Vitals reviewed.   ED Course  Procedures (including critical care time) Labs Review Labs Reviewed  COMPREHENSIVE METABOLIC PANEL - Abnormal; Notable for the following:    Glucose, Bld 138 (*)    BUN 26 (*)    Creatinine, Ser 1.06 (*)    Calcium 8.6 (*)    Albumin 3.0 (*)    ALT 8 (*)     GFR calc non Af Amer 47 (*)    GFR calc Af Amer 55 (*)    All other components within normal limits  URINALYSIS, ROUTINE W REFLEX MICROSCOPIC (NOT AT [facility]) - Abnormal; Notable for the following:    APPearance TURBID (*)    Hgb urine dipstick SMALL (*)    Protein, ur >300 (*)    Nitrite POSITIVE (*)    Leukocytes, UA MODERATE (*)    All other components within normal limits  URINE MICROSCOPIC-ADD ON - Abnormal; Notable for the following:    Bacteria, UA FEW (*)    All other components within normal limits  CBC WITH DIFFERENTIAL/PLATELET - Abnormal; Notable for the following:  Platelets 477 (*)    All other components within normal limits  LIPASE, BLOOD  CBC WITH DIFFERENTIAL/PLATELET  I-STAT TROPOININ, ED    Imaging Review Ct Head Wo Contrast  03/31/2015   CLINICAL DATA:  Acute onset of expressive aphasia. Initial encounter.  EXAM: CT HEAD WITHOUT CONTRAST  TECHNIQUE: Contiguous axial images were obtained from the base of the skull through the vertex without intravenous contrast.  COMPARISON:  CT of the head performed 02/16/2015  FINDINGS: There is no evidence of acute infarction, mass lesion, or intra- or extra-axial hemorrhage on CT.  Prominence of the ventricles and sulci reflects moderate cortical volume loss. Cerebellar atrophy is noted. Scattered periventricular and subcortical white matter change likely reflects small vessel ischemic microangiopathy. A chronic infarct is noted at the right frontoparietal region, with associated encephalomalacia. A chronic infarct is also noted at the left occipital lobe. A chronic lacunar infarct is noted at the right basal ganglia.  The brainstem and fourth ventricle are within normal limits. No mass effect or midline shift is seen.  There is no evidence of fracture; visualized osseous structures are unremarkable in appearance. The orbits are within normal limits. The paranasal sinuses and left mastoid air cells are well-aerated. There is partial  opacification of the right mastoid air cells. No significant soft tissue abnormalities are seen.  IMPRESSION: 1. No acute intracranial pathology seen on CT. 2. Moderate cortical volume loss and scattered small vessel ischemic microangiopathy. 3. Chronic infarct at the right frontoparietal region, with associated encephalomalacia. Chronic infarct at the left occipital lobe. Chronic lacunar infarct at the right basal ganglia. 4. Partial opacification of the right mastoid air cells.   Electronically Signed   By: Garald Balding M.D.   On: 03/31/2015 21:02   Ct Abdomen Pelvis W Contrast  03/31/2015   CLINICAL DATA:  Urinary tract infection.  Acute abdominal pain.  EXAM: CT ABDOMEN AND PELVIS WITH CONTRAST  TECHNIQUE: Multidetector CT imaging of the abdomen and pelvis was performed using the standard protocol following bolus administration of intravenous contrast.  CONTRAST:  61mL OMNIPAQUE IOHEXOL 300 MG/ML  SOLN  COMPARISON:  02/16/2015  FINDINGS: BODY WALL: Subcutaneous reticulation over both hips and the sacrum, chronic and likely pressure related. No associated fluid collection or subcutaneous gas.  LOWER CHEST: Atelectasis or scarring in the right lower lobe. Advanced atherosclerosis with remote subendocardial infarct of the inferior wall left ventricle. Irregular thrombus and plaque present throughout the descending aorta.  ABDOMEN/PELVIS:  Liver: 1 cm low-density nodule in the right liver, neighboring the gallbladder fossa, benign based on stability from 2014.  Biliary: High-density material within the gallbladder consistent with cholelithiasis. No evidence of acute cholecystitis. No bile duct enlargement.  Pancreas: Unremarkable.  Spleen: Unremarkable.  Adrenals: Left adrenal thickening without discrete nodule.  Kidneys and ureters: Bilateral renal cortical thinning/ scarring. Patchy hypo enhancement involving the left more than right renal cortex compatible with pyelonephritis. No urinary obstruction or  abscess.  Bladder: Decompressed by a Foley catheter. Bladder wall thickening correlating with cystitis history.  Reproductive: Hysterectomy.  No adnexal mass.  Bowel: Transverse colostomy. No bowel obstruction or inflammatory change. No appendicitis.  Retroperitoneum: Remote fat necrosis below the left lobe of the liver.  Peritoneum: No ascites or pneumoperitoneum.  Vascular: Extensive atherosclerosis with multi focal high-grade by iliac systems stenosis and right superficial femoral artery occlusion, chronic.  OSSEOUS: Marked muscular atrophy and osteopenia.  No acute findings.  IMPRESSION: 1. Pyelonephritis without abscess or hydronephrosis. 2. Numerous chronic findings are stable  from 02/16/2015 and noted above.   Electronically Signed   By: Monte Fantasia M.D.   On: 03/31/2015 21:06     EKG Interpretation   Date/Time:  Monday March 31 2015 18:04:31 EDT Ventricular Rate:  72 PR Interval:  196 QRS Duration: 153 QT Interval:  472 QTC Calculation: 517 R Axis:   71 Text Interpretation:  Sinus rhythm Right bundle branch block No  significant change since last tracing Confirmed by Debby Freiberg [phone])  on 03/31/2015 6:13:32 PM      MDM   Final diagnoses:  Abdominal pain, acute  Pyelonephritis    81 y.o. female with pertinent PMH of prior CVA with partial R spastic hemiparesis, recent visit for UTI presents with recurrent dysphagia, lasting approximately 15 minutes. On arrival patient is asymptomatic.  She had not filled her Keflex which was prescribed for recent urinary tract infection. No fevers, GI symptoms at home. On arrival today vitals signs and physical exam as above, unchanged from patient baseline. Patient states she feels normal. She did have some abdominal tenderness in the right lower and right upper quadrant. Subsequently, a CT scan was obtained which demonstrated pyelonephritis. Patient does not have a leukocytosis, is afebrile, no GI symptoms, feel her stable for outpatient  antibiotics.  Discharged home in stable condition.  I have reviewed all laboratory and imaging studies if ordered as above  1. Pyelonephritis   2. Abdominal pain, acute         Debby Freiberg, MD 03/31/15 2233

## 2015-03-31 NOTE — ED Notes (Signed)
Pt currently being tx for UTI with Keflex. Pt has colostomy bag and daughter stated that she is passing white colored stool that has a foul odor. Pt has not started prescription because the pharmacy has been closed but did get a dose in the hospital.

## 2015-03-31 NOTE — ED Notes (Signed)
PTAR CALLED@ [phone].

## 2015-04-08 ENCOUNTER — Encounter (HOSPITAL_COMMUNITY): Payer: Self-pay | Admitting: Emergency Medicine

## 2015-04-08 ENCOUNTER — Inpatient Hospital Stay (HOSPITAL_COMMUNITY)
Admission: EM | Admit: 2015-04-08 | Discharge: 2015-04-11 | DRG: 699 | Disposition: A | Discharge status: Home / Self Care | Payer: Medicare Other | Attending: Internal Medicine | Admitting: Internal Medicine

## 2015-04-08 DIAGNOSIS — T8351XA Infection and inflammatory reaction due to indwelling urinary catheter, initial encounter: Principal | ICD-10-CM | POA: Diagnosis present

## 2015-04-08 DIAGNOSIS — I5032 Chronic diastolic (congestive) heart failure: Secondary | ICD-10-CM | POA: Diagnosis present

## 2015-04-08 DIAGNOSIS — N3090 Cystitis, unspecified without hematuria: Secondary | ICD-10-CM | POA: Diagnosis present

## 2015-04-08 DIAGNOSIS — N390 Urinary tract infection, site not specified: Secondary | ICD-10-CM | POA: Diagnosis not present

## 2015-04-08 DIAGNOSIS — Z933 Colostomy status: Secondary | ICD-10-CM

## 2015-04-08 DIAGNOSIS — J449 Chronic obstructive pulmonary disease, unspecified: Secondary | ICD-10-CM | POA: Diagnosis present

## 2015-04-08 DIAGNOSIS — K7460 Unspecified cirrhosis of liver: Secondary | ICD-10-CM | POA: Diagnosis present

## 2015-04-08 DIAGNOSIS — D638 Anemia in other chronic diseases classified elsewhere: Secondary | ICD-10-CM | POA: Diagnosis present

## 2015-04-08 DIAGNOSIS — F17210 Nicotine dependence, cigarettes, uncomplicated: Secondary | ICD-10-CM | POA: Diagnosis present

## 2015-04-08 DIAGNOSIS — I10 Essential (primary) hypertension: Secondary | ICD-10-CM | POA: Diagnosis present

## 2015-04-08 DIAGNOSIS — N183 Chronic kidney disease, stage 3 (moderate): Secondary | ICD-10-CM | POA: Diagnosis present

## 2015-04-08 DIAGNOSIS — E876 Hypokalemia: Secondary | ICD-10-CM | POA: Diagnosis present

## 2015-04-08 DIAGNOSIS — E1122 Type 2 diabetes mellitus with diabetic chronic kidney disease: Secondary | ICD-10-CM | POA: Diagnosis present

## 2015-04-08 DIAGNOSIS — Z66 Do not resuscitate: Secondary | ICD-10-CM | POA: Diagnosis present

## 2015-04-08 DIAGNOSIS — Z85038 Personal history of other malignant neoplasm of large intestine: Secondary | ICD-10-CM

## 2015-04-08 DIAGNOSIS — Z90710 Acquired absence of both cervix and uterus: Secondary | ICD-10-CM

## 2015-04-08 DIAGNOSIS — F0390 Unspecified dementia without behavioral disturbance: Secondary | ICD-10-CM | POA: Diagnosis present

## 2015-04-08 DIAGNOSIS — I129 Hypertensive chronic kidney disease with stage 1 through stage 4 chronic kidney disease, or unspecified chronic kidney disease: Secondary | ICD-10-CM | POA: Diagnosis present

## 2015-04-08 DIAGNOSIS — D72829 Elevated white blood cell count, unspecified: Secondary | ICD-10-CM

## 2015-04-08 DIAGNOSIS — I4891 Unspecified atrial fibrillation: Secondary | ICD-10-CM | POA: Diagnosis present

## 2015-04-08 DIAGNOSIS — Z7982 Long term (current) use of aspirin: Secondary | ICD-10-CM

## 2015-04-08 DIAGNOSIS — Y846 Urinary catheterization as the cause of abnormal reaction of the patient, or of later complication, without mention of misadventure at the time of the procedure: Secondary | ICD-10-CM | POA: Diagnosis present

## 2015-04-08 DIAGNOSIS — Z8673 Personal history of transient ischemic attack (TIA), and cerebral infarction without residual deficits: Secondary | ICD-10-CM

## 2015-04-08 DIAGNOSIS — E1165 Type 2 diabetes mellitus with hyperglycemia: Secondary | ICD-10-CM | POA: Diagnosis present

## 2015-04-08 DIAGNOSIS — Z886 Allergy status to analgesic agent status: Secondary | ICD-10-CM

## 2015-04-08 DIAGNOSIS — E119 Type 2 diabetes mellitus without complications: Secondary | ICD-10-CM

## 2015-04-08 DIAGNOSIS — B965 Pseudomonas (aeruginosa) (mallei) (pseudomallei) as the cause of diseases classified elsewhere: Secondary | ICD-10-CM | POA: Diagnosis present

## 2015-04-08 DIAGNOSIS — I69351 Hemiplegia and hemiparesis following cerebral infarction affecting right dominant side: Secondary | ICD-10-CM

## 2015-04-08 LAB — CBC WITH DIFFERENTIAL/PLATELET
BASOS ABS: 0 10*3/uL (ref 0.0–0.1)
Basophils Relative: 0 % (ref 0–1)
EOS ABS: 0.5 10*3/uL (ref 0.0–0.7)
Eosinophils Relative: 5 % (ref 0–5)
HCT: 36.1 % (ref 36.0–46.0)
Hemoglobin: 11.2 g/dL — ABNORMAL LOW (ref 12.0–15.0)
Lymphocytes Relative: 21 % (ref 12–46)
Lymphs Abs: 2.2 10*3/uL (ref 0.7–4.0)
MCH: 28.1 pg (ref 26.0–34.0)
MCHC: 31 g/dL (ref 30.0–36.0)
MCV: 90.7 fL (ref 78.0–100.0)
Monocytes Absolute: 0.5 10*3/uL (ref 0.1–1.0)
Monocytes Relative: 5 % (ref 3–12)
NEUTROS PCT: 69 % (ref 43–77)
Neutro Abs: 7.5 10*3/uL (ref 1.7–7.7)
Platelets: 393 10*3/uL (ref 150–400)
RBC: 3.98 MIL/uL (ref 3.87–5.11)
RDW: 15.4 % (ref 11.5–15.5)
WBC: 10.7 10*3/uL — ABNORMAL HIGH (ref 4.0–10.5)

## 2015-04-08 LAB — BASIC METABOLIC PANEL
Anion gap: 9 (ref 5–15)
BUN: 26 mg/dL — ABNORMAL HIGH (ref 6–20)
CO2: 29 mmol/L (ref 22–32)
Calcium: 8.7 mg/dL — ABNORMAL LOW (ref 8.9–10.3)
Chloride: 102 mmol/L (ref 101–111)
Creatinine, Ser: 1.25 mg/dL — ABNORMAL HIGH (ref 0.44–1.00)
GFR calc Af Amer: 45 mL/min — ABNORMAL LOW (ref >60)
GFR, EST NON AFRICAN AMERICAN: 39 mL/min — AB (ref >60)
GLUCOSE: 174 mg/dL — AB (ref 65–99)
Potassium: 3 mmol/L — ABNORMAL LOW (ref 3.5–5.1)
SODIUM: 140 mmol/L (ref 135–145)

## 2015-04-08 LAB — URINALYSIS, ROUTINE W REFLEX MICROSCOPIC
Bilirubin Urine: NEGATIVE
Glucose, UA: 100 mg/dL — AB
Ketones, ur: NEGATIVE mg/dL
Nitrite: NEGATIVE
Protein, ur: >300 mg/dL — AB
Specific Gravity, Urine: 1.022 (ref 1.005–1.030)
Urobilinogen, UA: 0.2 mg/dL (ref 0.0–1.0)
pH: 6 (ref 5.0–8.0)

## 2015-04-08 LAB — URINE MICROSCOPIC-ADD ON

## 2015-04-08 LAB — MAGNESIUM: Magnesium: 1.7 mg/dL (ref 1.7–2.4)

## 2015-04-08 MED ORDER — FAMOTIDINE 20 MG PO TABS
20.0000 mg | ORAL_TABLET | Freq: Two times a day (BID) | ORAL | Status: DC
  Administered 2015-04-08 – 2015-04-11 (×6): 20 mg via ORAL
  Filled 2015-04-08 (×7): qty 1

## 2015-04-08 MED ORDER — ENOXAPARIN SODIUM 30 MG/0.3ML ~~LOC~~ SOLN
30.0000 mg | SUBCUTANEOUS | Status: DC
  Administered 2015-04-08: 30 mg via SUBCUTANEOUS
  Filled 2015-04-08 (×2): qty 0.3

## 2015-04-08 MED ORDER — QUETIAPINE FUMARATE 25 MG PO TABS
25.0000 mg | ORAL_TABLET | Freq: Every day | ORAL | Status: DC
  Administered 2015-04-08 – 2015-04-10 (×3): 25 mg via ORAL
  Filled 2015-04-08 (×4): qty 1

## 2015-04-08 MED ORDER — MORPHINE SULFATE 4 MG/ML IJ SOLN
4.0000 mg | Freq: Once | INTRAMUSCULAR | Status: AC
  Administered 2015-04-08: 4 mg via INTRAVENOUS
  Filled 2015-04-08: qty 1

## 2015-04-08 MED ORDER — ASPIRIN EC 81 MG PO TBEC
81.0000 mg | DELAYED_RELEASE_TABLET | Freq: Every day | ORAL | Status: DC
  Administered 2015-04-09 – 2015-04-11 (×3): 81 mg via ORAL
  Filled 2015-04-08 (×3): qty 1

## 2015-04-08 MED ORDER — POTASSIUM CHLORIDE 10 MEQ/100ML IV SOLN
10.0000 meq | INTRAVENOUS | Status: AC
  Administered 2015-04-08 – 2015-04-09 (×2): 10 meq via INTRAVENOUS
  Filled 2015-04-08 (×2): qty 100

## 2015-04-08 MED ORDER — CEFTRIAXONE SODIUM IN DEXTROSE 20 MG/ML IV SOLN
1.0000 g | INTRAVENOUS | Status: DC
  Administered 2015-04-09: 1 g via INTRAVENOUS
  Filled 2015-04-08 (×2): qty 50

## 2015-04-08 MED ORDER — ONDANSETRON HCL 4 MG/2ML IJ SOLN
4.0000 mg | Freq: Once | INTRAMUSCULAR | Status: AC
  Administered 2015-04-08: 4 mg via INTRAVENOUS
  Filled 2015-04-08: qty 2

## 2015-04-08 MED ORDER — POTASSIUM CHLORIDE IN NACL 20-0.45 MEQ/L-% IV SOLN
INTRAVENOUS | Status: DC
  Administered 2015-04-08: 22:00:00 via INTRAVENOUS
  Filled 2015-04-08: qty 1000

## 2015-04-08 MED ORDER — ONDANSETRON HCL 4 MG/2ML IJ SOLN: 4.0000 mg | Freq: Four times a day (QID) | INTRAMUSCULAR | Status: DC | PRN

## 2015-04-08 MED ORDER — NITROFURANTOIN MONOHYD MACRO 100 MG PO CAPS: 100.0000 mg | ORAL_CAPSULE | Freq: Once | ORAL | Status: DC

## 2015-04-08 MED ORDER — ASPIRIN EC 81 MG PO TBEC: 81.0000 mg | DELAYED_RELEASE_TABLET | Freq: Every day | ORAL | Status: DC

## 2015-04-08 MED ORDER — FOSFOMYCIN TROMETHAMINE 3 G PO PACK
3.0000 g | PACK | Freq: Once | ORAL | Status: AC
  Administered 2015-04-08: 3 g via ORAL
  Filled 2015-04-08: qty 3

## 2015-04-08 MED ORDER — NITROGLYCERIN 0.4 MG SL SUBL: 0.4000 mg | SUBLINGUAL_TABLET | SUBLINGUAL | Status: DC | PRN

## 2015-04-08 MED ORDER — MORPHINE SULFATE 2 MG/ML IJ SOLN
2.0000 mg | Freq: Once | INTRAMUSCULAR | Status: AC
  Administered 2015-04-08: 2 mg via INTRAVENOUS
  Filled 2015-04-08: qty 1

## 2015-04-08 MED ORDER — OXYCODONE HCL 5 MG PO TABS
10.0000 mg | ORAL_TABLET | Freq: Four times a day (QID) | ORAL | Status: DC | PRN
Start: 2015-04-08 — End: 2015-04-12
  Administered 2015-04-08 – 2015-04-11 (×4): 10 mg via ORAL
  Filled 2015-04-08 (×4): qty 2

## 2015-04-08 MED ORDER — ONDANSETRON HCL 4 MG PO TABS: 4.0000 mg | ORAL_TABLET | Freq: Four times a day (QID) | ORAL | Status: DC | PRN

## 2015-04-08 MED ORDER — DEXTROSE 5 % IV SOLN
1.0000 g | Freq: Once | INTRAVENOUS | Status: AC
  Administered 2015-04-08: 1 g via INTRAVENOUS
  Filled 2015-04-08: qty 10

## 2015-04-08 MED ORDER — GABAPENTIN 100 MG PO CAPS
200.0000 mg | ORAL_CAPSULE | Freq: Three times a day (TID) | ORAL | Status: DC
  Administered 2015-04-08 – 2015-04-11 (×9): 200 mg via ORAL
  Filled 2015-04-08 (×10): qty 2

## 2015-04-08 MED ORDER — CIPROFLOXACIN IN D5W 400 MG/200ML IV SOLN
400.0000 mg | Freq: Once | INTRAVENOUS | Status: DC
  Filled 2015-04-08: qty 200

## 2015-04-08 NOTE — ED Notes (Signed)
I attempted to collect labs and was unsuccessful. 

## 2015-04-08 NOTE — ED Notes (Signed)
Bed: WA02 Expected date:  Expected time:  Means of arrival:  Comments: EMS-possible sepsis

## 2015-04-08 NOTE — ED Notes (Signed)
Pt's indwelling foley removed without issue and new foley placed with aseptic technique according to hospital policy.  Pt tolerated procedure well.

## 2015-04-08 NOTE — ED Provider Notes (Signed)
CSN: [id number]     Arrival date & time 04/08/15  1451 History   First MD Initiated Contact with Patient 04/08/15 1505     Chief Complaint  Patient presents with  . Urinary Tract Infection    recent dx UTI, on abx, foul urine "pus in foley bag"     (Consider location/radiation/quality/duration/timing/severity/associated sxs/prior Treatment) HPI Comments: Pt comes in with c/o infection in the foley catheter. The daughter states that her mother has had multiple bladder spasms everyday and she knows that something is going on what that happens. Denies vomiting, diarrhea. She was recently diagnosed with pyelonephritis and was put on keflex. She has finished the antibiotics. She had a temperature of  100.3 today.  The history is provided by the patient and a relative. No language interpreter was used.    Past Medical History  Diagnosis Date  . COPD (chronic obstructive pulmonary disease)   . CHF (congestive heart failure)     ECHO 2011  EF: 60% to 07%, grd 1 distolic dysfxn, 62-26% 3335 ECHO, tech limited by Afib  . Hypertension   . Stroke 2008    rt side deficit  . A-fib   . Back pain   . Constipation   . SBO (small bowel obstruction)     per GI note in 2011, pt denies  . Upper GI bleed 2011    required hospitalization  . Colon polyps   . Tobacco abuse   . Diabetes mellitus     per daughter doctor took patient off DM medication 6 months ago   . Cancer   . Colon cancer   . Cirrhosis of liver    Past Surgical History  Procedure Laterality Date  . Total abdominal hysterectomy  1980  . Colonoscopy N/A 09/04/2013    Procedure: COLONOSCOPY;  Surgeon: Lear Ng, MD;  [facility]: [facility] ENDOSCOPY;  Service: Endoscopy;  Laterality: N/A;  . Colon resection N/A 09/06/2013    Procedure: PARTIAL COLECTOMY WITH COLOSTOMY;  Surgeon: Earnstine Regal, MD;  Location: [place];  Service: General;  Laterality: N/A;  . Colostomy revision N/A 09/08/2013    Procedure: COLOSTOMY REVISION;  Surgeon:  Gwenyth Ober, MD;  Location: [place];  Service: General;  Laterality: N/A;  . Colostomy     Family History  Problem Relation Age of Onset  . Lung cancer Mother   . Heart disease Father    History  Substance Use Topics  . Smoking status: Current Some Day Smoker -- 0.50 packs/day for 15 years    Types: Cigarettes  . Smokeless tobacco: Never Used  . Alcohol Use: No   OB History    No data available     Review of Systems  All other systems reviewed and are negative.     Allergies  Aggrenox  Home Medications   Prior to Admission medications   Medication Sig Start Date End Date Taking? Authorizing Provider  aspirin EC 81 MG EC tablet Take 1 tablet (81 mg total) by mouth daily. 01/25/15  Yes Silver Huguenin Elgergawy, MD  cephALEXin (KEFLEX) 500 MG capsule Take 1 capsule (500 mg total) by mouth 2 (two) times daily. 03/27/15  Yes Waynetta Pean, PA-C  gabapentin (NEURONTIN) 100 MG capsule Take 2 capsules (200 mg total) by mouth 3 (three) times daily. 01/25/15  Yes Albertine Patricia, MD  LORazepam (ATIVAN) 0.5 MG tablet Take 0.5 mg by mouth 2 (two) times daily as needed for anxiety.   Yes Historical Provider, MD  Naproxen  Sodium (ALEVE) 220 MG CAPS Take 220 mg by mouth every 12 (twelve) hours as needed (pain).   Yes Historical Provider, MD  Oxycodone HCl 10 MG TABS Take 10 mg by mouth every 8 (eight) hours as needed (pain).   Yes Historical Provider, MD  prochlorperazine (COMPAZINE) 10 MG tablet Take 10 mg by mouth every 8 (eight) hours as needed for nausea or vomiting.   Yes Historical Provider, MD  QUEtiapine (SEROQUEL) 25 MG tablet Take 25 mg by mouth at bedtime.  01/03/15  Yes Historical Provider, MD  traMADol (ULTRAM) 50 MG tablet Take 0.5 tablets (25 mg total) by mouth every 6 (six) hours as needed. pain Patient taking differently: Take 50 mg by mouth every 6 (six) hours as needed for moderate pain. pain 01/25/15  Yes Albertine Patricia, MD  acetaminophen (TYLENOL) 325 MG tablet Take 2  tablets (650 mg total) by mouth every 6 (six) hours as needed for mild pain, moderate pain or fever (or Fever >/= 101). Patient not taking: Reported on 03/31/2015 06/21/14   Modena Jansky, MD  metoprolol tartrate (LOPRESSOR) 12.5 mg TABS tablet Take 0.5 tablets (12.5 mg total) by mouth 2 (two) times daily. Patient not taking: Reported on 03/27/2015 06/21/14   Modena Jansky, MD  nitroGLYCERIN (NITROSTAT) 0.4 MG SL tablet Place 0.4 mg under the tongue every 5 (five) minutes as needed for chest pain.    Historical Provider, MD  sulfamethoxazole-trimethoprim (BACTRIM DS,SEPTRA DS) 800-160 MG per tablet Take 1 tablet by mouth 2 (two) times daily. Patient not taking: Reported on 03/27/2015 02/21/15   Hosie Poisson, MD   BP 162/56 mmHg  Pulse 86  Temp(Src) 98.8 F (37.1 C) (Oral)  Resp 18  SpO2 93% Physical Exam  Constitutional: She appears well-developed and well-nourished.  Cardiovascular: Normal rate and regular rhythm.   Pulmonary/Chest: Effort normal.  Abdominal: Soft. Bowel sounds are normal.  Suprapubic tenderness  Neurological: She is alert.  Right sided weakness  Skin: Skin is warm and dry.  Nursing note and vitals reviewed.   ED Course  Procedures (including critical care time) Labs Review Labs Reviewed  BASIC METABOLIC PANEL - Abnormal; Notable for the following:    Potassium 3.0 (*)    Glucose, Bld 174 (*)    BUN 26 (*)    Creatinine, Ser 1.25 (*)    Calcium 8.7 (*)    GFR calc non Af Amer 39 (*)    GFR calc Af Amer 45 (*)    All other components within normal limits  CBC WITH DIFFERENTIAL/PLATELET - Abnormal; Notable for the following:    WBC 10.7 (*)    Hemoglobin 11.2 (*)    All other components within normal limits  URINALYSIS, ROUTINE W REFLEX MICROSCOPIC (NOT AT [facility]) - Abnormal; Notable for the following:    Color, Urine RED (*)    APPearance TURBID (*)    Glucose, UA 100 (*)    Hgb urine dipstick LARGE (*)    Protein, ur >300 (*)    Leukocytes, UA TRACE (*)     All other components within normal limits  URINE MICROSCOPIC-ADD ON - Abnormal; Notable for the following:    Bacteria, UA FEW (*)    All other components within normal limits  URINE CULTURE    Imaging Review No results found.   EKG Interpretation None      MDM   Final diagnoses:  UTI (lower urinary tract infection)  Leukocytosis    Will admit pt for uti.  Pt is not septic at this time. Will start on cipro. Foley changed. Urine sent for culture    Glendell Docker, NP 04/08/15 1812  Davonna Belling, MD 04/09/15 [phone]

## 2015-04-08 NOTE — Progress Notes (Signed)
Pt presents with healed pressure ulcer on sacrum, intact and blanchable. Sacral foam dressing applied per protocol.

## 2015-04-08 NOTE — ED Notes (Signed)
PER EMS - pt from home with c/o possible UTI, on abx currently for same.  Hx recent kidney infection.  Foul urine "pus" in foley bag.

## 2015-04-08 NOTE — H&P (Signed)
[facility] History and Physical  NASRA COUNCE PVV:[id number] DOB: [birth year] DOA: 04/08/2015  Referring physician: Glendell Docker, NP PCP: [name], MD   Chief Complaint: Purulent Foley catheter discharge.  HPI: [name] is a 81 y.o. female with the low past medical history who is being brought by her daughter to the ER due to the alcohol chief complaint. Since June 30 the patient has had 2 different courses of antibiotics as per daughter due to UTI. However she noticed today that the Foley catheter was draining purulent urine and the patient had a temperature of 100.22F at home. She has a history of multiple UTIs in the past, including several cultures for Escherichia coli and 1 for pseudomonas aeruginosa which are MDRO, so given the symptoms, foley drainage and the previous history patient will be admitted to  IV antibiotic therapy while we wait for the urine culture and sensitivity. Patient is otherwise in no acute distress, but stated the her suprapubic pain has exacerbated again. She was medicated with 4 mg of morphine sulfate 3 hours ago.    Review of Systems:  Constitutional:  No weight loss, night sweats, Fevers, chills, fatigue.  HEENT:  No headaches, Difficulty swallowing,Tooth/dental problems,Sore throat,  No sneezing, itching, ear ache, nasal congestion, post nasal drip,  Cardio-vascular:  No chest pain, Orthopnea, PND, swelling in lower extremities, anasarca, dizziness, palpitations  GI:  No heartburn, indigestion, abdominal pain, nausea, vomiting, diarrhea, change in bowel habits, loss of appetite  Resp:  No shortness of breath with exertion or at rest. No excess mucus, no productive cough, No non-productive cough, No coughing up of blood.No change in color of mucus.No wheezing.No chest wall deformity  Skin:  no rash or lesions.  GU:  no dysuria, change in color of urine, no urgency or frequency. No flank pain.  Musculoskeletal:  No joint pain or  swelling. No decreased range of motion. No back pain.  Psych:  No change in mood or affect. No depression or anxiety. No memory loss.   Past Medical History  Diagnosis Date  . COPD (chronic obstructive pulmonary disease)   . CHF (congestive heart failure)     ECHO 2011  EF: 60% to 75%, grd 1 distolic dysfxn, 44-92% 0100 ECHO, tech limited by Afib  . Hypertension   . Stroke 2008    rt side deficit  . A-fib   . Back pain   . Constipation   . SBO (small bowel obstruction)     per GI note in 2011, pt denies  . Upper GI bleed 2011    required hospitalization  . Colon polyps   . Tobacco abuse   . Diabetes mellitus     per daughter doctor took patient off DM medication 6 months ago   . Cancer   . Colon cancer   . Cirrhosis of liver    Past Surgical History  Procedure Laterality Date  . Total abdominal hysterectomy  1980  . Colonoscopy N/A 09/04/2013    Procedure: COLONOSCOPY;  Surgeon: Lear Ng, MD;  Location: [facility] ENDOSCOPY;  Service: Endoscopy;  Laterality: N/A;  . Colon resection N/A 09/06/2013    Procedure: PARTIAL COLECTOMY WITH COLOSTOMY;  Surgeon: Earnstine Regal, MD;  Location: [place];  Service: General;  Laterality: N/A;  . Colostomy revision N/A 09/08/2013    Procedure: COLOSTOMY REVISION;  Surgeon: Gwenyth Ober, MD;  Location: [place];  Service: General;  Laterality: N/A;  . Colostomy     Social History:  reports that she has been smoking Cigarettes.  She has a 7.5 pack-year smoking history. She has never used smokeless tobacco. She reports that she does not drink alcohol or use illicit drugs.  Allergies  Allergen Reactions  . Aggrenox [Aspirin-Dipyridamole Er] Diarrhea and Nausea And Vomiting    & abdominal bleeding    Family History  Problem Relation Age of Onset  . Lung cancer Mother   . Heart disease Father     Prior to Admission medications   Medication Sig Start Date End Date Taking? Authorizing Provider  aspirin EC 81 MG EC tablet Take 1 tablet (81  mg total) by mouth daily. 01/25/15  Yes Silver Huguenin Elgergawy, MD  cephALEXin (KEFLEX) 500 MG capsule Take 1 capsule (500 mg total) by mouth 2 (two) times daily. 03/27/15  Yes Waynetta Pean, PA-C  gabapentin (NEURONTIN) 100 MG capsule Take 2 capsules (200 mg total) by mouth 3 (three) times daily. 01/25/15  Yes Albertine Patricia, MD  LORazepam (ATIVAN) 0.5 MG tablet Take 0.5 mg by mouth 2 (two) times daily as needed for anxiety.   Yes Historical Provider, MD  Naproxen Sodium (ALEVE) 220 MG CAPS Take 220 mg by mouth every 12 (twelve) hours as needed (pain).   Yes Historical Provider, MD  Oxycodone HCl 10 MG TABS Take 10 mg by mouth every 8 (eight) hours as needed (pain).   Yes Historical Provider, MD  prochlorperazine (COMPAZINE) 10 MG tablet Take 10 mg by mouth every 8 (eight) hours as needed for nausea or vomiting.   Yes Historical Provider, MD  QUEtiapine (SEROQUEL) 25 MG tablet Take 25 mg by mouth at bedtime.  01/03/15  Yes Historical Provider, MD  traMADol (ULTRAM) 50 MG tablet Take 0.5 tablets (25 mg total) by mouth every 6 (six) hours as needed. pain Patient taking differently: Take 50 mg by mouth every 6 (six) hours as needed for moderate pain. pain 01/25/15  Yes Albertine Patricia, MD  acetaminophen (TYLENOL) 325 MG tablet Take 2 tablets (650 mg total) by mouth every 6 (six) hours as needed for mild pain, moderate pain or fever (or Fever >/= 101). Patient not taking: Reported on 03/31/2015 06/21/14   Modena Jansky, MD  metoprolol tartrate (LOPRESSOR) 12.5 mg TABS tablet Take 0.5 tablets (12.5 mg total) by mouth 2 (two) times daily. Patient not taking: Reported on 03/27/2015 06/21/14   Modena Jansky, MD  nitroGLYCERIN (NITROSTAT) 0.4 MG SL tablet Place 0.4 mg under the tongue every 5 (five) minutes as needed for chest pain.    Historical Provider, MD  sulfamethoxazole-trimethoprim (BACTRIM DS,SEPTRA DS) 800-160 MG per tablet Take 1 tablet by mouth 2 (two) times daily. Patient not taking: Reported on  03/27/2015 02/21/15   Hosie Poisson, MD   Physical Exam: Filed Vitals:   04/08/15 1530 04/08/15 1600 04/08/15 1630 04/08/15 1801  BP: 148/51 155/59 159/64 146/47  Pulse:  84 78 82  Temp:      TempSrc:      Resp: 15 19 22 18   SpO2:    91%    Wt Readings from Last 3 Encounters:  02/16/15 68.1 kg (150 lb 2.1 oz)  01/21/15 64.2 kg (141 lb 8.6 oz)  06/21/14 71.079 kg (156 lb 11.2 oz)    General:  Appears calm and comfortable Eyes: PERRL, normal lids, irises & conjunctiva ENT: moderate decreased hearing, ear canals are clear and TM intact without bulging or erythema, lips & tongue are moist, the patient has what appears mild aphthous ulcers  on the right ventral side of tongue area. Neck: no LAD, masses or thyromegaly Cardiovascular: RRR, no m/r/g. No LE edema. Telemetry: SR, no arrhythmias  Respiratory: CTA bilaterally, no w/r/r. Normal respiratory effort. Abdomen: soft, positive suprapubic tenderness, no guarding, no rebound tenderness. Skin: no rash or induration seen on limited exam. Unable to examine for decubiti area due to patient's weight and feeling indisposed to do so at the time Musculoskeletal: Spastic on right side, particularly on right upper extremity Psychiatric: grossly normal mood and affect, speech fluent and apropiate for her baseline.  Neurologic: Right sided spastic hemiparesis.          Labs on Admission:  Basic Metabolic Panel:  Recent Labs Lab 04/08/15 1534  NA 140  K 3.0  CL 102  CO2 29  GLUCOSE 174  BUN 26  CREATININE 1.25  CALCIUM 8.7   Liver Function Tests: No results for input(s): AST, ALT, ALKPHOS, BILITOT, PROT, ALBUMIN in the last 168 hours. No results for input(s): LIPASE, AMYLASE in the last 168 hours. No results for input(s): AMMONIA in the last 168 hours. CBC:  Recent Labs Lab 04/08/15 1534  WBC 10.7  NEUTROABS 7.5  HGB 11.2  HCT 36.1  MCV 90.7  PLT 393   Cardiac Enzymes: No results for input(s): CKTOTAL, CKMB, CKMBINDEX,  TROPONINI in the last 168 hours.  BNP (last 3 results) No results for input(s): BNP in the last 8760 hours.  ProBNP (last 3 results) No results for input(s): PROBNP in the last 8760 hours.  CBG: No results for input(s): GLUCAP in the last 168 hours.  Radiological Exams on Admission: No results found.  EKG: Independently reviewed result from 07/05. NSR with RBBB  Assessment/Plan Principal Problem:   UTI (urinary tract infection) due to urinary indwelling catheter Active Problems:   Hypertension   Anemia   Dementia   H/O: CVA (cerebrovascular accident)- with R Hemiparesis/contractures   Hypokalemia   DNR (do not resuscitate)   Plan The patient has had two different UCS with MDRO. One was positive for E.Coli and the other for Pseudomonas Aeruginosa. Both are sensitive to Ceftriaxone and nitrofurantoin. However, given the patient's renal clearance pharmacy recommended a single dose fosfomycin to double cover the possibility of P. Aeruginosa pending results of UCS. Contact isolation was started and IV rocephin is scheduled every 24 hours. The foley was changed by the ED department. Analgesics as needed for bladder spasms  Replace potassium and recheck level in AM. Check magnesium level. CBG monitoring for hyperglycemia. Will continue current home medications therapy and supportive care for other active and historical problems. The patient needs assistance getting out of bed and a chair, but walks with assistive devices per patient's daughter. BP and rest of vital signs monitoring.   Code Status :DNR DVT Prophylaxis: Pharmacological  Family Communication: Daughter Saintclair Halsted [phone] Disposition Plan: Home with continued home health.  Time spent: 60 minutes  Reubin Milan [facility] Pager [phone]

## 2015-04-08 NOTE — ED Notes (Signed)
Attempted to call report to floor, RN unavailable and will return my call.

## 2015-04-09 DIAGNOSIS — D72829 Elevated white blood cell count, unspecified: Secondary | ICD-10-CM | POA: Diagnosis present

## 2015-04-09 DIAGNOSIS — E1165 Type 2 diabetes mellitus with hyperglycemia: Secondary | ICD-10-CM | POA: Diagnosis present

## 2015-04-09 DIAGNOSIS — N3090 Cystitis, unspecified without hematuria: Secondary | ICD-10-CM | POA: Diagnosis present

## 2015-04-09 DIAGNOSIS — I10 Essential (primary) hypertension: Secondary | ICD-10-CM | POA: Diagnosis not present

## 2015-04-09 DIAGNOSIS — Z7982 Long term (current) use of aspirin: Secondary | ICD-10-CM | POA: Diagnosis not present

## 2015-04-09 DIAGNOSIS — Z85038 Personal history of other malignant neoplasm of large intestine: Secondary | ICD-10-CM | POA: Diagnosis not present

## 2015-04-09 DIAGNOSIS — E876 Hypokalemia: Secondary | ICD-10-CM | POA: Diagnosis present

## 2015-04-09 DIAGNOSIS — Z886 Allergy status to analgesic agent status: Secondary | ICD-10-CM | POA: Diagnosis not present

## 2015-04-09 DIAGNOSIS — I69351 Hemiplegia and hemiparesis following cerebral infarction affecting right dominant side: Secondary | ICD-10-CM | POA: Diagnosis not present

## 2015-04-09 DIAGNOSIS — T8351XA Infection and inflammatory reaction due to indwelling urinary catheter, initial encounter: Principal | ICD-10-CM

## 2015-04-09 DIAGNOSIS — F17210 Nicotine dependence, cigarettes, uncomplicated: Secondary | ICD-10-CM | POA: Diagnosis present

## 2015-04-09 DIAGNOSIS — I5032 Chronic diastolic (congestive) heart failure: Secondary | ICD-10-CM | POA: Diagnosis present

## 2015-04-09 DIAGNOSIS — N390 Urinary tract infection, site not specified: Secondary | ICD-10-CM | POA: Diagnosis not present

## 2015-04-09 DIAGNOSIS — I4891 Unspecified atrial fibrillation: Secondary | ICD-10-CM | POA: Diagnosis present

## 2015-04-09 DIAGNOSIS — Z90710 Acquired absence of both cervix and uterus: Secondary | ICD-10-CM | POA: Diagnosis not present

## 2015-04-09 DIAGNOSIS — Z66 Do not resuscitate: Secondary | ICD-10-CM | POA: Diagnosis present

## 2015-04-09 DIAGNOSIS — N183 Chronic kidney disease, stage 3 (moderate): Secondary | ICD-10-CM | POA: Diagnosis present

## 2015-04-09 DIAGNOSIS — B965 Pseudomonas (aeruginosa) (mallei) (pseudomallei) as the cause of diseases classified elsewhere: Secondary | ICD-10-CM | POA: Diagnosis present

## 2015-04-09 DIAGNOSIS — F0390 Unspecified dementia without behavioral disturbance: Secondary | ICD-10-CM | POA: Diagnosis present

## 2015-04-09 DIAGNOSIS — Y846 Urinary catheterization as the cause of abnormal reaction of the patient, or of later complication, without mention of misadventure at the time of the procedure: Secondary | ICD-10-CM | POA: Diagnosis present

## 2015-04-09 DIAGNOSIS — K7460 Unspecified cirrhosis of liver: Secondary | ICD-10-CM | POA: Diagnosis present

## 2015-04-09 DIAGNOSIS — E1122 Type 2 diabetes mellitus with diabetic chronic kidney disease: Secondary | ICD-10-CM | POA: Diagnosis present

## 2015-04-09 DIAGNOSIS — J449 Chronic obstructive pulmonary disease, unspecified: Secondary | ICD-10-CM | POA: Diagnosis present

## 2015-04-09 DIAGNOSIS — I129 Hypertensive chronic kidney disease with stage 1 through stage 4 chronic kidney disease, or unspecified chronic kidney disease: Secondary | ICD-10-CM | POA: Diagnosis present

## 2015-04-09 DIAGNOSIS — Z933 Colostomy status: Secondary | ICD-10-CM | POA: Diagnosis not present

## 2015-04-09 DIAGNOSIS — D638 Anemia in other chronic diseases classified elsewhere: Secondary | ICD-10-CM | POA: Diagnosis present

## 2015-04-09 LAB — GLUCOSE, CAPILLARY
Glucose-Capillary: 122 mg/dL — ABNORMAL HIGH (ref 65–99)
Glucose-Capillary: 151 mg/dL — ABNORMAL HIGH (ref 65–99)
Glucose-Capillary: 144 mg/dL — ABNORMAL HIGH (ref 65–99)
Glucose-Capillary: 147 mg/dL — ABNORMAL HIGH (ref 65–99)

## 2015-04-09 LAB — COMPREHENSIVE METABOLIC PANEL
ALT: 8 U/L — AB (ref 14–54)
AST: 14 U/L — AB (ref 15–41)
Albumin: 2.7 g/dL — ABNORMAL LOW (ref 3.5–5.0)
Alkaline Phosphatase: 55 U/L (ref 38–126)
Anion gap: 6 (ref 5–15)
BILIRUBIN TOTAL: 0.6 mg/dL (ref 0.3–1.2)
BUN: 26 mg/dL — ABNORMAL HIGH (ref 6–20)
CALCIUM: 8.6 mg/dL — AB (ref 8.9–10.3)
CHLORIDE: 105 mmol/L (ref 101–111)
CO2: 28 mmol/L (ref 22–32)
CREATININE: 1.11 mg/dL — AB (ref 0.44–1.00)
GFR calc Af Amer: 52 mL/min — ABNORMAL LOW (ref >60)
GFR calc non Af Amer: 45 mL/min — ABNORMAL LOW (ref >60)
GLUCOSE: 181 mg/dL — AB (ref 65–99)
POTASSIUM: 3.8 mmol/L (ref 3.5–5.1)
SODIUM: 139 mmol/L (ref 135–145)
Total Protein: 6.3 g/dL — ABNORMAL LOW (ref 6.5–8.1)

## 2015-04-09 LAB — CBC
HCT: 34.8 % — ABNORMAL LOW (ref 36.0–46.0)
Hemoglobin: 10.5 g/dL — ABNORMAL LOW (ref 12.0–15.0)
MCH: 27.6 pg (ref 26.0–34.0)
MCHC: 30.2 g/dL (ref 30.0–36.0)
MCV: 91.6 fL (ref 78.0–100.0)
Platelets: 414 10*3/uL — ABNORMAL HIGH (ref 150–400)
RBC: 3.8 MIL/uL — ABNORMAL LOW (ref 3.87–5.11)
RDW: 15.4 % (ref 11.5–15.5)
WBC: 9.8 10*3/uL (ref 4.0–10.5)

## 2015-04-09 MED ORDER — INSULIN ASPART 100 UNIT/ML ~~LOC~~ SOLN
0.0000 [IU] | Freq: Three times a day (TID) | SUBCUTANEOUS | Status: DC
  Administered 2015-04-10: 2 [IU] via SUBCUTANEOUS
  Administered 2015-04-10: 1 [IU] via SUBCUTANEOUS
  Administered 2015-04-11: 2 [IU] via SUBCUTANEOUS
  Administered 2015-04-11: 1 [IU] via SUBCUTANEOUS

## 2015-04-09 MED ORDER — HYDRALAZINE HCL 20 MG/ML IJ SOLN: 10.0000 mg | INTRAMUSCULAR | Status: DC | PRN

## 2015-04-09 MED ORDER — ENOXAPARIN SODIUM 40 MG/0.4ML ~~LOC~~ SOLN
40.0000 mg | SUBCUTANEOUS | Status: DC
  Administered 2015-04-09 – 2015-04-10 (×2): 40 mg via SUBCUTANEOUS
  Filled 2015-04-09 (×3): qty 0.4

## 2015-04-09 NOTE — Progress Notes (Signed)
[facility] PROGRESS NOTE  [name]:[id number] DOB: 03-Apr-1930 DOA: 04/08/2015 PCP: [name], MD  Brief Summary  [name] is a 81 y.o. female with the low past medical history who is being brought by her daughter to the ER due to purulent urine in her catheter. Since June 30 the patient was treated with 2 different courses of antibiotics for UTI.  Daughter noticed that the Foley catheter was draining purulent urine and the patient had a temperature of 100.60F at home. She has a history of multiple MDR UTIs recently.  She was started on ceftriaxone + fosfomycin which her recent infections have generally been sensitive to and we are awaiting culture results.    Assessment/Plan  UTI, with hx of MDRO.  -  Given fosfomycin on 7/12 -  Continue ceftriaxone -  F/u urine culture -  Foley was changed in ER  Hypokelamia resolved with supplementation, magnesium wnl.  Diabetes mellitus type 2 with renal manifestations and hyperglycemia -  Start low dose SSI  Chronic diastolic heart failure, not on diuretics at home and appears euvolemic  Atrial fibrillation with bradycardia to 40s -  Continue to hold BB  Hx of CVA with right hemiparesis  Hx of colon cancer s/p colostomy placement  Cirrhosis of liver, stable.  Anemia of chronic disease, hemoglobin approximately stable  CKD stage 3 due to diabetes mellitus, creatinine stable at baseline 1.11 -  Minimize nephrotoxins and renally dose medications  Diet:  Diabetic  Access:  PIV IVF:  off Proph:  lovenox  Code Status: DNR Family Communication: patient alone Disposition Plan: pending culture results   Consultants:  none  Procedures:  Catheter exchange on 7/12  Antibiotics:  Fosfomycin on 7/12 x 1  ceftrixaone 7/12 >    HPI/Subjective:  Continues to have suprapubic discomfort, but otherwise feeling much better.  Felt like she had the "flu" yesterday, but now no longer has HA, muscle aches and her  energy is better.  Objective: Filed Vitals:   04/09/15 0515 04/09/15 0517 04/09/15 1109 04/09/15 1255  BP: 170/49  121/39 139/43  Pulse: 45 45 68 64  Temp: 97.8 F (36.6 C)  97.8 F (36.6 C) 97.9 F (36.6 C)  TempSrc: Oral  Oral Oral  Resp: 20  16 16   Height:      Weight:      SpO2: 98%  96% 99%    Intake/Output Summary (Last 24 hours) at 04/09/15 1826 Last data filed at 04/09/15 1742  Gross per 24 hour  Intake 1125.83 ml  Output      0 ml  Net 1125.83 ml   Filed Weights   04/08/15 2002  Weight: 68.6 kg (151 lb 3.8 oz)   Body mass index is 26.8 kg/(m^2).  Exam:   General:  Adult female, No acute distress  HEENT:  NCAT, MMM  Cardiovascular:  RRR, nl S1, S2 no mrg, 2+ pulses, warm extremities  Respiratory:  CTAB, no increased WOB  Abdomen:   NABS, soft, ND, ostomy bag in right quadrant with soft brown stool.  TTP over suprapubic area without rebound or guarding  MSK:   Normal tone and bulk, no LEE  Neuro:  Grossly intact  Data Reviewed: Basic Metabolic Panel:  Recent Labs Lab 04/08/15 1534 04/09/15 0400  NA 140 139  K 3.0* 3.8  CL 102 105  CO2 29 28  GLUCOSE 174* 181*  BUN 26* 26*  CREATININE 1.25* 1.11*  CALCIUM 8.7* 8.6*  MG 1.7  --  Liver Function Tests:  Recent Labs Lab 04/09/15 0400  AST 14*  ALT 8*  ALKPHOS 55  BILITOT 0.6  PROT 6.3*  ALBUMIN 2.7*   No results for input(s): LIPASE, AMYLASE in the last 168 hours. No results for input(s): AMMONIA in the last 168 hours. CBC:  Recent Labs Lab 04/08/15 1534 04/09/15 0400  WBC 10.7* 9.8  NEUTROABS 7.5  --   HGB 11.2* 10.5*  HCT 36.1 34.8*  MCV 90.7 91.6  PLT 393 414*    Recent Results (from the past 240 hour(s))  Urine culture     Status: None (Preliminary result)   Collection Time: 04/08/15  5:19 PM  Result Value Ref Range Status   Specimen Description URINE, CATHETERIZED  Final   Special Requests NONE  Final   Culture   Final    CULTURE REINCUBATED FOR BETTER  GROWTH Performed at [facility]    Report Status PENDING  Incomplete     Studies: No results found.  Scheduled Meds: . aspirin EC  81 mg Oral Daily  . cefTRIAXone (ROCEPHIN)  IV  1 g Intravenous Q24H  . enoxaparin (LOVENOX) injection  40 mg Subcutaneous Q24H  . famotidine  20 mg Oral BID  . gabapentin  200 mg Oral TID  . QUEtiapine  25 mg Oral QHS   Continuous Infusions:   Principal Problem:   UTI (urinary tract infection) due to urinary indwelling catheter Active Problems:   Hypertension   Anemia   Dementia   H/O: CVA (cerebrovascular accident)- with R Hemiparesis/contractures   Hypokalemia   DNR (do not resuscitate)    Time spent: 30 min    Enis Riecke, [facility] Pager [phone]. If 7PM-7AM, please contact night-coverage at [email or web address], password [facility] 04/09/2015, 6:26 PM  LOS: 0 days

## 2015-04-09 NOTE — Progress Notes (Signed)
BP this AM 170/49, HR 45. Baltazar Najjar NP notified, awaiting orders. Noreene Larsson RN, BSN

## 2015-04-09 NOTE — Progress Notes (Signed)
Inpatient Diabetes Program Recommendations  AACE/ADA: New Consensus Statement on Inpatient Glycemic Control (2013)  Target Ranges:  Prepandial:   less than 140 mg/dL      Peak postprandial:   less than 180 mg/dL (1-2 hours)      Critically ill patients:  140 - 180 mg/dL   Reason for Visit: Hyperglycemia  Diabetes history: DM2 Outpatient Diabetes medications: None Current orders for Inpatient glycemic control: None  Results for ARACELLI, WOLOSZYN (MRN [id number]) as of 04/09/2015 10:25  Ref. Range 04/09/2015 08:35  Glucose-Capillary Latest Ref Range: 65-99 mg/dL 122 (H)  Results for IDELLA, LAMONTAGNE (MRN [id number]) as of 04/09/2015 10:25  Ref. Range 02/16/2015 14:00  Hemoglobin A1C Latest Ref Range: 4.8-5.6 % 7.4 (H)  Results for KEM, PARCHER (MRN [id number]) as of 04/09/2015 10:25  Ref. Range 04/08/2015 15:34 04/09/2015 04:00  Glucose Latest Ref Range: 65-99 mg/dL 174 (H) 181 (H)   If blood sugars > 180 mg/dL, recommend Novolog sensitive tidwc.  Will continue to follow. Thank you. Lorenda Peck, RD, LDN, CDE Inpatient Diabetes Coordinator [phone]

## 2015-04-10 DIAGNOSIS — I10 Essential (primary) hypertension: Secondary | ICD-10-CM

## 2015-04-10 LAB — BASIC METABOLIC PANEL
ANION GAP: 5 (ref 5–15)
BUN: 27 mg/dL — ABNORMAL HIGH (ref 6–20)
CALCIUM: 8.7 mg/dL — AB (ref 8.9–10.3)
CO2: 30 mmol/L (ref 22–32)
Chloride: 102 mmol/L (ref 101–111)
Creatinine, Ser: 1.29 mg/dL — ABNORMAL HIGH (ref 0.44–1.00)
GFR calc non Af Amer: 37 mL/min — ABNORMAL LOW (ref >60)
GFR, EST AFRICAN AMERICAN: 43 mL/min — AB (ref >60)
GLUCOSE: 150 mg/dL — AB (ref 65–99)
Potassium: 4.3 mmol/L (ref 3.5–5.1)
SODIUM: 137 mmol/L (ref 135–145)

## 2015-04-10 LAB — CBC
HCT: 34 % — ABNORMAL LOW (ref 36.0–46.0)
HEMOGLOBIN: 10.4 g/dL — AB (ref 12.0–15.0)
MCH: 27.9 pg (ref 26.0–34.0)
MCHC: 30.6 g/dL (ref 30.0–36.0)
MCV: 91.2 fL (ref 78.0–100.0)
Platelets: 375 10*3/uL (ref 150–400)
RBC: 3.73 MIL/uL — ABNORMAL LOW (ref 3.87–5.11)
RDW: 15.4 % (ref 11.5–15.5)
WBC: 8.2 10*3/uL (ref 4.0–10.5)

## 2015-04-10 LAB — GLUCOSE, CAPILLARY
GLUCOSE-CAPILLARY: 149 mg/dL — AB (ref 65–99)
GLUCOSE-CAPILLARY: 115 mg/dL — AB (ref 65–99)
Glucose-Capillary: 165 mg/dL — ABNORMAL HIGH (ref 65–99)
Glucose-Capillary: 165 mg/dL — ABNORMAL HIGH (ref 65–99)

## 2015-04-10 LAB — HEMOGLOBIN A1C
Hgb A1c MFr Bld: 7.3 % — ABNORMAL HIGH (ref 4.8–5.6)
Mean Plasma Glucose: 163 mg/dL

## 2015-04-10 MED ORDER — CIPROFLOXACIN IN D5W 400 MG/200ML IV SOLN
400.0000 mg | Freq: Two times a day (BID) | INTRAVENOUS | Status: DC
  Administered 2015-04-10 – 2015-04-11 (×2): 400 mg via INTRAVENOUS
  Filled 2015-04-10 (×4): qty 200

## 2015-04-10 NOTE — Progress Notes (Signed)
ANTIBIOTIC CONSULT NOTE - INITIAL  Pharmacy Consult for ciprofloxacin Indication: UTI  Allergies  Allergen Reactions  . Aggrenox [Aspirin-Dipyridamole Er] Diarrhea and Nausea And Vomiting    & abdominal bleeding    Patient Measurements: Height: 5\' 3"  (160 cm) Weight: 151 lb 3.8 oz (68.6 kg) IBW/kg (Calculated) : 52.4  Vital Signs: Temp: 98.1 F (36.7 C) (07/14 1416) Temp Source: Oral (07/14 1416) BP: 148/39 mmHg (07/14 1416) Pulse Rate: 58 (07/14 1416) Intake/Output from previous day: 07/13 0701 - 07/14 0700 In: 720 [P.O.:720] Out: 450 [Urine:350; Stool:100] Intake/Output from this shift: Total I/O In: -  Out: 550 [Urine:550]  Labs:  Recent Labs  04/08/15 1534 04/09/15 0400 04/10/15 0355  WBC 10.7* 9.8 8.2  HGB 11.2* 10.5* 10.4*  PLT 393 414* 375  CREATININE 1.25* 1.11* 1.29*   Estimated Creatinine Clearance: 30.7 mL/min (by C-G formula based on Cr of 1.29). No results for input(s): VANCOTROUGH, VANCOPEAK, VANCORANDOM, GENTTROUGH, GENTPEAK, GENTRANDOM, TOBRATROUGH, TOBRAPEAK, TOBRARND, AMIKACINPEAK, AMIKACINTROU, AMIKACIN in the last 72 hours.   Microbiology: Recent Results (from the past 720 hour(s))  Urine culture     Status: None   Collection Time: 03/27/15  3:37 PM  Result Value Ref Range Status   Specimen Description URINE, CATHETERIZED  Final   Special Requests NONE  Final   Culture   Final    MULTIPLE SPECIES PRESENT, SUGGEST RECOLLECTION IF CLINICALLY INDICATED Performed at [facility]    Report Status 03/30/2015 FINAL  Final  Urine culture     Status: None (Preliminary result)   Collection Time: 04/08/15  5:19 PM  Result Value Ref Range Status   Specimen Description URINE, CATHETERIZED  Final   Special Requests NONE  Final   Culture   Final    >=100,000 COLONIES/mL PSEUDOMONAS AERUGINOSA Performed at [facility]    Report Status PENDING  Incomplete    Medical History: Past Medical History  Diagnosis Date  . COPD  (chronic obstructive pulmonary disease)   . CHF (congestive heart failure)     ECHO 2011  EF: 60% to 09%, grd 1 distolic dysfxn, 40-76% 8088 ECHO, tech limited by Afib  . Hypertension   . Stroke 2008    rt side deficit  . A-fib   . Back pain   . Constipation   . SBO (small bowel obstruction)     per GI note in 2011, pt denies  . Upper GI bleed 2011    required hospitalization  . Colon polyps   . Tobacco abuse   . Diabetes mellitus     per daughter doctor took patient off DM medication 6 months ago   . Cancer   . Colon cancer   . Cirrhosis of liver    Assessment: Patient's an 81 y.o F with hx CVA, dementia, HTN,  indwelling catheter, and recurrent UTIs.  Ucx now growing pseudomonas.  To start cipro for UTI.    7/12 fosfomycin x1 7/12 CTX>>7/14 7/14 cipro>>  4/26 ucx: pseudomonas (S= cefepime, ceftaz, cipro, gent, imip, zosyn, tobra); Ecoli (S= CTX, nitrof; R= ampi, ancef, cipro,  LVQ, gent, zosyn, bactrim; I= tobra)  7/12 ucx: >100K pseudomonas (susceptibility pending)   Plan:  - cipro 400 mg IV q12h - f/u cultures - monitor renal function and adjust dose if/when appropriate  Sonny Anthes P 04/10/2015,4:24 PM

## 2015-04-10 NOTE — Progress Notes (Addendum)
[facility] PROGRESS NOTE  MAYERLI KIRST PYP:[id number] DOB: Sep 26, 1930 DOA: 04/08/2015 PCP: [name], MD  Brief Summary  AVALYNNE DIVER is a 81 y.o. female with the low past medical history who is being brought by her daughter to the ER due to purulent urine in her catheter. Since June 30 the patient was treated with 2 different courses of antibiotics for UTI.  Daughter noticed that the Foley catheter was draining purulent urine and the patient had a temperature of 100.41F at home. She has a history of multiple MDR UTIs recently.  She was started on ceftriaxone + fosfomycin which her recent infections have generally been sensitive to and we are awaiting culture results.    Assessment/Plan  Pseudomonas catheter-associated UTI, present at time of admission -  Given fosfomycin on 7/12 -  D/c ceftriaxone and start cipro  -  F/u speciation -  Foley was changed in ER  Hypokelamia resolved with supplementation, magnesium wnl.  Diabetes mellitus type 2 with renal manifestations and stable CBG -  Start low dose SSI  Chronic diastolic heart failure, not on diuretics at home and appears euvolemic  Atrial fibrillation with bradycardia to 40s -  Continue to hold BB  Hx of CVA with right hemiparesis  Hx of colon cancer s/p colostomy placement  Cirrhosis of liver, stable.  Anemia of chronic disease, hemoglobin approximately stable  CKD stage 3 due to diabetes mellitus, creatinine stable at baseline 1.11 -  Minimize nephrotoxins and renally dose medications  Chin ulcer -  F/u with dermatology if not improving in the next week  Diet:  Diabetic  Access:  PIV IVF:  off Proph:  lovenox  Code Status: DNR Family Communication: patient alone Disposition Plan: anticipate home tomorrow   Consultants:  none  Procedures:  Catheter exchange on 7/12  Antibiotics:  Fosfomycin on 7/12 x 1  ceftrixaone 7/12 >    HPI/Subjective:  Feels much better today and back to  baseline except for mild residual abdominal pain in RLQ  Objective: Filed Vitals:   04/09/15 1109 04/09/15 1255 04/10/15 0618 04/10/15 1416  BP: 121/39 139/43 124/105 148/39  Pulse: 68 64 110 58  Temp: 97.8 F (36.6 C) 97.9 F (36.6 C) 97.8 F (36.6 C) 98.1 F (36.7 C)  TempSrc: Oral Oral Oral Oral  Resp: 16 16 18 16   Height:      Weight:      SpO2: 96% 99% 88% 95%    Intake/Output Summary (Last 24 hours) at 04/10/15 1730 Last data filed at 04/10/15 1400  Gross per 24 hour  Intake    240 ml  Output   1000 ml  Net   -760 ml   Filed Weights   04/08/15 2002  Weight: 68.6 kg (151 lb 3.8 oz)   Body mass index is 26.8 kg/(m^2).  Exam:   General:  Adult female, No acute distress  HEENT:  NCAT, MMM, 1 cm ulcer on chin  Cardiovascular:  RRR, nl S1, S2 no mrg, 2+ pulses, warm extremities  Respiratory:  CTAB, no increased WOB  Abdomen:   NABS, soft, ND, ostomy bag in right quadrant with soft brown stool.  TTP over suprapubic area and RLQ without rebound or guarding  MSK:   Normal tone and bulk, no LEE  Neuro:  Grossly intact  Data Reviewed: Basic Metabolic Panel:  Recent Labs Lab 04/08/15 1534 04/09/15 0400 04/10/15 0355  NA 140 139 137  K 3.0* 3.8 4.3  CL 102 105 102  CO2  29 28 30   GLUCOSE 174* 181* 150*  BUN 26* 26* 27*  CREATININE 1.25* 1.11* 1.29*  CALCIUM 8.7* 8.6* 8.7*  MG 1.7  --   --    Liver Function Tests:  Recent Labs Lab 04/09/15 0400  AST 14*  ALT 8*  ALKPHOS 55  BILITOT 0.6  PROT 6.3*  ALBUMIN 2.7*   No results for input(s): LIPASE, AMYLASE in the last 168 hours. No results for input(s): AMMONIA in the last 168 hours. CBC:  Recent Labs Lab 04/08/15 1534 04/09/15 0400 04/10/15 0355  WBC 10.7* 9.8 8.2  NEUTROABS 7.5  --   --   HGB 11.2* 10.5* 10.4*  HCT 36.1 34.8* 34.0*  MCV 90.7 91.6 91.2  PLT 393 414* 375    Recent Results (from the past 240 hour(s))  Urine culture     Status: None (Preliminary result)   Collection  Time: 04/08/15  5:19 PM  Result Value Ref [place] Status   Specimen Description URINE, CATHETERIZED  Final   Special Requests NONE  Final   Culture   Final    >=100,000 COLONIES/mL PSEUDOMONAS AERUGINOSA Performed at [facility]    Report Status PENDING  Incomplete     Studies: No results found.  Scheduled Meds: . aspirin EC  81 mg Oral Daily  . ciprofloxacin  400 mg Intravenous Q12H  . enoxaparin (LOVENOX) injection  40 mg Subcutaneous Q24H  . famotidine  20 mg Oral BID  . gabapentin  200 mg Oral TID  . insulin aspart  0-9 Units Subcutaneous TID WC  . QUEtiapine  25 mg Oral QHS   Continuous Infusions:   Principal Problem:   UTI (urinary tract infection) due to urinary indwelling catheter Active Problems:   Hypertension   Anemia   Dementia   H/O: CVA (cerebrovascular accident)- with R Hemiparesis/contractures   Hypokalemia   DNR (do not resuscitate)    Time spent: 30 min    Serenidy Waltz, [facility] Pager [phone]. If 7PM-7AM, please contact night-coverage at [email or web address], password [facility] 04/10/2015, 5:30 PM  LOS: 1 day

## 2015-04-10 NOTE — Evaluation (Signed)
Physical Therapy One Time Evaluation Patient Details Name: [name] MRN: [id number] DOB: 01/31/1930 Today's Date: 04/10/2015   History of Present Illness  [name] is a 81 y.o. female has a past medical history significant for COPD, diastolic CHF, recurrent UTIs, PAF, CVA with residual right hemiparesis admitted for recurrent UTIs    Clinical Impression  Patient evaluated by Physical Therapy with no further acute PT needs identified.   Pt appears at her baseline which is likely mostly bedbound.  No family present however previous admission note from PTA on 02/20/15 states pt lives with daughter who provides 24/7 care and has w/c and hospital bed with plan at that time to get hoyer lift.  Pt not appropriate for skilled PT at this time. PT is signing off. Thank you for this referral.     Follow Up Recommendations No PT follow up    Equipment Recommendations  Other (comment) (hoyer lift if not acquired after last admission)    Recommendations for Other Services       Precautions / Restrictions Precautions Precautions: Fall Precaution Comments: residual R hemiparesis, L colostomy      Mobility  Bed Mobility Overal bed mobility: Needs Assistance Bed Mobility: Rolling Rolling: Max assist         General bed mobility comments: pt able to initiate rolling however requires assist to complete, would require +2 assist to sit EOB and pt also was not agreeable  Transfers                    Ambulation/Gait                Stairs            Wheelchair Mobility    Modified Rankin (Stroke Patients Only)       Balance                                             Pertinent Vitals/Pain Pain Assessment: No/denies pain    Home Living Family/patient expects to be discharged to:: Private residence Living Arrangements: Children             Home Equipment: Wheelchair - manual;Hospital bed      Prior Function Level of  Independence: Needs assistance         Comments: no family present but likely needs assist.      Hand Dominance        Extremity/Trunk Assessment   Upper Extremity Assessment: RUE deficits/detail RUE Deficits / Details: contracted wrist and hand in flexion, limited elbow and shoulder active ROM         Lower Extremity Assessment: Generalized weakness (pt able to perform LE ROM actively in supine, limited DF bilaterally)         Communication   Communication: No difficulties  Cognition Arousal/Alertness: Awake/alert Behavior During Therapy: WFL for tasks assessed/performed Overall Cognitive Status: No family/caregiver present to determine baseline cognitive functioning (hx dementia)                      General Comments      Exercises        Assessment/Plan    PT Assessment Patent does not need any further PT services  PT Diagnosis     PT Problem List    PT Treatment Interventions     PT  Goals (Current goals can be found in the Care Plan section) Acute Rehab PT Goals PT Goal Formulation: All assessment and education complete, DC therapy    Frequency     Barriers to discharge        Co-evaluation               End of Session   Activity Tolerance: Patient tolerated treatment well Patient left: in bed;with call bell/phone within reach;with bed alarm set Nurse Communication: Mobility status         Time: 7425-9563 PT Time Calculation (min) (ACUTE ONLY): 11 min   Charges:   PT Evaluation $Initial PT Evaluation Tier I: 1 Procedure     PT G Codes:        Kaiden Dardis,KATHrine E 04/10/2015, 12:53 PM Carmelia Bake, PT, DPT 04/10/2015 Pager: [phone]

## 2015-04-11 LAB — BASIC METABOLIC PANEL
Anion gap: 8 (ref 5–15)
BUN: 25 mg/dL — AB (ref 6–20)
CO2: 24 mmol/L (ref 22–32)
CREATININE: 1.21 mg/dL — AB (ref 0.44–1.00)
Calcium: 8.8 mg/dL — ABNORMAL LOW (ref 8.9–10.3)
Chloride: 106 mmol/L (ref 101–111)
GFR calc Af Amer: 47 mL/min — ABNORMAL LOW (ref >60)
GFR calc non Af Amer: 40 mL/min — ABNORMAL LOW (ref >60)
Glucose, Bld: 123 mg/dL — ABNORMAL HIGH (ref 65–99)
POTASSIUM: 3.8 mmol/L (ref 3.5–5.1)
SODIUM: 138 mmol/L (ref 135–145)

## 2015-04-11 LAB — GLUCOSE, CAPILLARY
GLUCOSE-CAPILLARY: 118 mg/dL — AB (ref 65–99)
GLUCOSE-CAPILLARY: 167 mg/dL — AB (ref 65–99)
Glucose-Capillary: 138 mg/dL — ABNORMAL HIGH (ref 65–99)

## 2015-04-11 LAB — URINE CULTURE

## 2015-04-11 LAB — CBC
HEMATOCRIT: 34.1 % — AB (ref 36.0–46.0)
Hemoglobin: 10.3 g/dL — ABNORMAL LOW (ref 12.0–15.0)
MCH: 26.9 pg (ref 26.0–34.0)
MCHC: 30.2 g/dL (ref 30.0–36.0)
MCV: 89 fL (ref 78.0–100.0)
PLATELETS: 349 10*3/uL (ref 150–400)
RBC: 3.83 MIL/uL — AB (ref 3.87–5.11)
RDW: 15.3 % (ref 11.5–15.5)
WBC: 8.3 10*3/uL (ref 4.0–10.5)

## 2015-04-11 MED ORDER — SACCHAROMYCES BOULARDII 250 MG PO CAPS: 250.0000 mg | ORAL_CAPSULE | Freq: Two times a day (BID) | ORAL | Status: DC

## 2015-04-11 MED ORDER — CEPHALEXIN 500 MG PO CAPS
500.0000 mg | ORAL_CAPSULE | Freq: Two times a day (BID) | ORAL | Status: DC
  Administered 2015-04-11: 500 mg via ORAL
  Filled 2015-04-11: qty 1

## 2015-04-11 MED ORDER — FAMOTIDINE 20 MG PO TABS: 20.0000 mg | ORAL_TABLET | Freq: Two times a day (BID) | ORAL | Status: DC

## 2015-04-11 MED ORDER — TRAMADOL HCL 50 MG PO TABS: 50.0000 mg | ORAL_TABLET | Freq: Four times a day (QID) | ORAL | Status: DC | PRN

## 2015-04-11 NOTE — Care Management Important Message (Signed)
Important Message  Patient Details IM letter given to Chitika/ Aide to present to patient.Important Message  Patient Details  Name: [name] MRN: [id number] Date of Birth: [birth year]   Medicare Important Message Given:  Yes-second notification given    Camillo Flaming 04/11/2015, 12:39 PM Name: [name] MRN: [id number] Date of Birth: [birth year]   Medicare Important Message Given:  Yes-second notification given    Camillo Flaming 04/11/2015, 12:38 PM

## 2015-04-11 NOTE — Progress Notes (Signed)
Pt confused calling for her mother. Discharge instructions explained to daughter and prescription given to daughter. Appetite good for dinner. Ptar called and will pick pt up and bring to daughter's at 5:30.

## 2015-04-11 NOTE — Progress Notes (Signed)
[facility]   [facility] is providing the following services: [facility] Lift  If patient discharges after hours, please call [phone].   Linward Headland 04/11/2015, 3:48 PM

## 2015-04-11 NOTE — Care Management Note (Signed)
Case Management Note  Patient Details  Name: [name] MRN: [id number] Date of Birth: 10-16-1929  Subjective/Objective:              81 yo admitted with UTI      Action/Plan: Lives with daughter and receives [facility] home care.  Expected Discharge Date:   (unknown)               Expected Discharge Plan:  [place]  In-House Referral:     Discharge planning Services  CM Consult  Post Acute Care Choice:    Choice offered to:     DME Arranged:   ([facility]ar Lift) DME Agency:  [place]:    [place]:  [place]  Status of Service:  Completed, signed off  Medicare Important Message Given:  Yes-second notification given Date Medicare IM Given:    Medicare IM give by:    Date Additional Medicare IM Given:    Additional Medicare Important Message give by:     If discussed at [place] of Stay Meetings, dates discussed:    Additional Comments:  Lynnell Catalan, RN 04/11/2015, 3:08 PM

## 2015-04-11 NOTE — Progress Notes (Signed)
Pt for discharge home with pt daughter. CSW received notification that pt needed ambulance transport via [place] for return home.  CSW confirmed address with pt daughter at bedside and arranged ambulance transport via [place].  No further social work needs identified at this time.  CSW signing off.   Alison Murray, MSW, [facility] [employer] [phone]

## 2015-04-11 NOTE — Discharge Summary (Signed)
Physician Discharge Summary  GENESEE NASE QGB:[id number] DOB: 10-14-1929 DOA: 04/08/2015  PCP: [name], MD  Admit date: 04/08/2015 Discharge date: 04/11/2015  Recommendations for Outpatient Follow-up:  1. Transfer to home in care of daughter  Discharge Diagnoses:  Principal Problem:   UTI (urinary tract infection) due to urinary indwelling catheter Active Problems:   Hypertension   Anemia   Dementia   H/O: CVA (cerebrovascular accident)- with R Hemiparesis/contractures   Hypokalemia   DNR (do not resuscitate)   Discharge Condition: stable, improved  Diet recommendation:  diabetic  Wt Readings from Last 3 Encounters:  04/08/15 68.6 kg (151 lb 3.8 oz)  02/16/15 68.1 kg (150 lb 2.1 oz)  01/21/15 64.2 kg (141 lb 8.6 oz)    History of present illness:   [name] is a 81 y.o. female with the low past medical history who is being brought by her daughter to the ER due to purulent urine in her catheter. Since June 30 the patient was treated with 2 different courses of antibiotics for UTI. Daughter noticed that the Foley catheter was draining purulent urine and the patient had a temperature of 100.27F at home. She has a history of multiple MDR UTIs recently. She was started on ceftriaxone + fosfomycin which her recent infections have generally been sensitive to.  Hospital Course:   Catheter-associated UTI, present at time of admission, and quickly cleared with treatment with fosfomycin and ceftriaxone.  Although her urine culture grew pseudomonas, her temperature and WBC trended down and her urine changed to clear yellow without antibiotics effected against pseudomonas.  The case was discussed with infectious disease, who felt that she probably had infection with bacteria sensitive to the ceftriaxone and that the pseudomonas was more likely a colonizer given her indwelling catheter.  Dr. Linus Salmons recommended no further treatment for her cystitis since she is clinically improved  and has been exposed to multiple antibiotics recently.  Her foley catheter was exchanged in the emergency department.    Hypokelamia resolved with supplementation, magnesium wnl.  Diabetes mellitus type 2 with renal manifestations and stable CBG.  Started low dose SSI during hospitalization.  Chronic diastolic heart failure, not on diuretics at home and appeared euvolemic  Atrial fibrillation with bradycardia to 40s, held BB and heart rate gradually trended up.   - Continue to hold BB  Hx of CVA with right hemiparesis.  Continued aspirin.  Not on high dose statin due to age and comorbidities.    Hx of colon cancer s/p colostomy placement  Cirrhosis of liver, stable.  Anemia of chronic disease, hemoglobin approximately stable  CKD stage 3 due to diabetes mellitus, creatinine stable at baseline 1.11.  Chin ulcer, F/u with dermatology if not improving in the next week   Consultants:  Case discussed with Dr. Linus Salmons by phone  Procedures:  Catheter exchange on 7/12  Antibiotics:  Fosfomycin on 7/12 x 1  ceftrixaone 7/12 >  Discharge Exam: Filed Vitals:   04/11/15 0501  BP: 170/61  Pulse: 71  Temp: 98.3 F (36.8 C)  Resp: 16   Filed Vitals:   04/10/15 2014 04/10/15 2140 04/10/15 2255 04/11/15 0501  BP:  94/79 152/82 170/61  Pulse: 68 63  71  Temp:  98 F (36.7 C)  98.3 F (36.8 C)  TempSrc:  Oral  Oral  Resp:  16  16  Height:      Weight:      SpO2:  97%  96%     General: Adult  female, No acute distress  HEENT: NCAT, MMM, 1 cm ulcer on chin now developing some scab  Cardiovascular: RRR, nl S1, S2 no mrg, 2+ pulses, warm extremities  Respiratory: CTAB, no increased WOB  Abdomen: NABS, soft, ND, ostomy bag in right quadrant with soft brown stool. TTP over suprapubic area and RLQ without rebound or guarding, stable from prior and chronic  MSK: Normal tone and bulk, no LEE  Neuro: Grossly intact  Discharge Instructions      Discharge  Instructions    (HEART FAILURE PATIENTS) Call MD:  Anytime you have any of the following symptoms: 1) 3 pound weight gain in 24 hours or 5 pounds in 1 week 2) shortness of breath, with or without a dry hacking cough 3) swelling in the hands, feet or stomach 4) if you have to sleep on extra pillows at night in order to breathe.    Complete by:  As directed      Call MD for:  difficulty breathing, headache or visual disturbances    Complete by:  As directed      Call MD for:  extreme fatigue    Complete by:  As directed      Call MD for:  hives    Complete by:  As directed      Call MD for:  persistant dizziness or light-headedness    Complete by:  As directed      Call MD for:  persistant nausea and vomiting    Complete by:  As directed      Call MD for:  severe uncontrolled pain    Complete by:  As directed      Call MD for:  temperature >100.4    Complete by:  As directed      Diet Carb Modified    Complete by:  As directed      Discharge instructions    Complete by:  As directed   You received 4 days of IV antibiotics for urinary tract infection.  Your case was discussed with the infectious disease doctor.  We feel that the pseudomonas that grew from your urine culture was probably colonizing your bladder but not causing your infection since you got better without treatment for the pseudomonas.  We did not grow any other bacteria and have no other obvious source of infection.  Please monitor carefully at home for recurrent infection just as you have been previously.  I have included the information for the infectious disease clinic also since you have had numerous recent infections including some with resistance to multiple bacteria.  Finally, I have given a prescription for a probiotic which can reduce the risk of infectious diarrhea which can be caused by repeated or prolonged exposure to antibiotics.     Increase activity slowly    Complete by:  As directed             Medication  List    STOP taking these medications        acetaminophen 325 MG tablet  Commonly known as:  TYLENOL     cephALEXin 500 MG capsule  Commonly known as:  KEFLEX     LORazepam 0.5 MG tablet  Commonly known as:  ATIVAN     metoprolol tartrate 12.5 mg Tabs tablet  Commonly known as:  LOPRESSOR     Oxycodone HCl 10 MG Tabs     sulfamethoxazole-trimethoprim 800-160 MG per tablet  Commonly known as:  BACTRIM DS,SEPTRA DS  TAKE these medications        ALEVE 220 MG Caps  Generic drug:  Naproxen Sodium  Take 220 mg by mouth every 12 (twelve) hours as needed (pain).     aspirin 81 MG EC tablet  Take 1 tablet (81 mg total) by mouth daily.     famotidine 20 MG tablet  Commonly known as:  PEPCID  Take 1 tablet (20 mg total) by mouth 2 (two) times daily.     gabapentin 100 MG capsule  Commonly known as:  NEURONTIN  Take 2 capsules (200 mg total) by mouth 3 (three) times daily.     nitroGLYCERIN 0.4 MG SL tablet  Commonly known as:  NITROSTAT  Place 0.4 mg under the tongue every 5 (five) minutes as needed for chest pain.     prochlorperazine 10 MG tablet  Commonly known as:  COMPAZINE  Take 10 mg by mouth every 8 (eight) hours as needed for nausea or vomiting.     QUEtiapine 25 MG tablet  Commonly known as:  SEROQUEL  Take 25 mg by mouth at bedtime.     saccharomyces boulardii 250 MG capsule  Commonly known as:  FLORASTOR  Take 1 capsule (250 mg total) by mouth 2 (two) times daily.     traMADol 50 MG tablet  Commonly known as:  ULTRAM  Take 1 tablet (50 mg total) by mouth every 6 (six) hours as needed. pain       Follow-up Information    Follow up with [name], MD. Schedule an appointment as soon as possible for a visit in 1 week.   Specialty:  Family Medicine   Contact information:   [address] [place] [address] [phone]        The results of significant diagnostics from this hospitalization (including imaging,  microbiology, ancillary and laboratory) are listed below for reference.    Significant Diagnostic Studies: Ct Head Wo Contrast  03/31/2015   CLINICAL DATA:  Acute onset of expressive aphasia. Initial encounter.  EXAM: CT HEAD WITHOUT CONTRAST  TECHNIQUE: Contiguous axial images were obtained from the base of the skull through the vertex without intravenous contrast.  COMPARISON:  CT of the head performed 02/16/2015  FINDINGS: There is no evidence of acute infarction, mass lesion, or intra- or extra-axial hemorrhage on CT.  Prominence of the ventricles and sulci reflects moderate cortical volume loss. Cerebellar atrophy is noted. Scattered periventricular and subcortical white matter change likely reflects small vessel ischemic microangiopathy. A chronic infarct is noted at the right frontoparietal region, with associated encephalomalacia. A chronic infarct is also noted at the left occipital lobe. A chronic lacunar infarct is noted at the right basal ganglia.  The brainstem and fourth ventricle are within normal limits. No mass effect or midline shift is seen.  There is no evidence of fracture; visualized osseous structures are unremarkable in appearance. The orbits are within normal limits. The paranasal sinuses and left mastoid air cells are well-aerated. There is partial opacification of the right mastoid air cells. No significant soft tissue abnormalities are seen.  IMPRESSION: 1. No acute intracranial pathology seen on CT. 2. Moderate cortical volume loss and scattered small vessel ischemic microangiopathy. 3. Chronic infarct at the right frontoparietal region, with associated encephalomalacia. Chronic infarct at the left occipital lobe. Chronic lacunar infarct at the right basal ganglia. 4. Partial opacification of the right mastoid air cells.   Electronically Signed   By: Francoise Schaumann.D.  On: 03/31/2015 21:02   Ct Abdomen Pelvis W Contrast  03/31/2015   CLINICAL DATA:  Urinary tract infection.   Acute abdominal pain.  EXAM: CT ABDOMEN AND PELVIS WITH CONTRAST  TECHNIQUE: Multidetector CT imaging of the abdomen and pelvis was performed using the standard protocol following bolus administration of intravenous contrast.  CONTRAST:  8mL OMNIPAQUE IOHEXOL 300 MG/ML  SOLN  COMPARISON:  02/16/2015  FINDINGS: BODY WALL: Subcutaneous reticulation over both hips and the sacrum, chronic and likely pressure related. No associated fluid collection or subcutaneous gas.  LOWER CHEST: Atelectasis or scarring in the right lower lobe. Advanced atherosclerosis with remote subendocardial infarct of the inferior wall left ventricle. Irregular thrombus and plaque present throughout the descending aorta.  ABDOMEN/PELVIS:  Liver: 1 cm low-density nodule in the right liver, neighboring the gallbladder fossa, benign based on stability from 2014.  Biliary: High-density material within the gallbladder consistent with cholelithiasis. No evidence of acute cholecystitis. No bile duct enlargement.  Pancreas: Unremarkable.  Spleen: Unremarkable.  Adrenals: Left adrenal thickening without discrete nodule.  Kidneys and ureters: Bilateral renal cortical thinning/ scarring. Patchy hypo enhancement involving the left more than right renal cortex compatible with pyelonephritis. No urinary obstruction or abscess.  Bladder: Decompressed by a Foley catheter. Bladder wall thickening correlating with cystitis history.  Reproductive: Hysterectomy.  No adnexal mass.  Bowel: Transverse colostomy. No bowel obstruction or inflammatory change. No appendicitis.  Retroperitoneum: Remote fat necrosis below the left lobe of the liver.  Peritoneum: No ascites or pneumoperitoneum.  Vascular: Extensive atherosclerosis with multi focal high-grade by iliac systems stenosis and right superficial femoral artery occlusion, chronic.  OSSEOUS: Marked muscular atrophy and osteopenia.  No acute findings.  IMPRESSION: 1. Pyelonephritis without abscess or hydronephrosis.  2. Numerous chronic findings are stable from 02/16/2015 and noted above.   Electronically Signed   By: Monte Fantasia M.D.   On: 03/31/2015 21:06    Microbiology: Recent Results (from the past 240 hour(s))  Urine culture     Status: None   Collection Time: 04/08/15  5:19 PM  Result Value Ref Range Status   Specimen Description URINE, CATHETERIZED  Final   Special Requests NONE  Final   Culture   Final    >=100,000 COLONIES/mL PSEUDOMONAS AERUGINOSA Performed at [facility]    Report Status 04/11/2015 FINAL  Final   Organism ID, Bacteria PSEUDOMONAS AERUGINOSA  Final      Susceptibility   Pseudomonas aeruginosa - MIC*    CEFTAZIDIME 4 SENSITIVE Sensitive     CIPROFLOXACIN >=4 RESISTANT Resistant     GENTAMICIN <=1 SENSITIVE Sensitive     IMIPENEM >=16 RESISTANT Resistant     PIP/TAZO 32 SENSITIVE Sensitive     CEFEPIME 4 SENSITIVE Sensitive     * >=100,000 COLONIES/mL PSEUDOMONAS AERUGINOSA     Labs: Basic Metabolic Panel:  Recent Labs Lab 04/08/15 1534 04/09/15 0400 04/10/15 0355 04/11/15 0419  NA 140 139 137 138  K 3.0* 3.8 4.3 3.8  CL 102 105 102 106  CO2 29 28 30 24   GLUCOSE 174* 181* 150* 123*  BUN 26* 26* 27* 25*  CREATININE 1.25* 1.11* 1.29* 1.21*  CALCIUM 8.7* 8.6* 8.7* 8.8*  MG 1.7  --   --   --    Liver Function Tests:  Recent Labs Lab 04/09/15 0400  AST 14*  ALT 8*  ALKPHOS 55  BILITOT 0.6  PROT 6.3*  ALBUMIN 2.7*   No results for input(s): LIPASE, AMYLASE in the last 168 hours.  No results for input(s): AMMONIA in the last 168 hours. CBC:  Recent Labs Lab 04/08/15 1534 04/09/15 0400 04/10/15 0355 04/11/15 0419  WBC 10.7* 9.8 8.2 8.3  NEUTROABS 7.5  --   --   --   HGB 11.2* 10.5* 10.4* 10.3*  HCT 36.1 34.8* 34.0* 34.1*  MCV 90.7 91.6 91.2 89.0  PLT 393 414* 375 349   Cardiac Enzymes: No results for input(s): CKTOTAL, CKMB, CKMBINDEX, TROPONINI in the last 168 hours. BNP: BNP (last 3 results) No results for input(s): BNP  in the last 8760 hours.  ProBNP (last 3 results) No results for input(s): PROBNP in the last 8760 hours.  CBG:  Recent Labs Lab 04/10/15 1129 04/10/15 1700 04/10/15 2135 04/11/15 0731 04/11/15 1224  GLUCAP 165* 115* 165* 118* 167*    Time coordinating discharge: 35 minutes  Signed:  Rakayla Ricklefs  [facility] 04/11/2015, 2:28 PM

## 2015-04-12 NOTE — Progress Notes (Signed)
[facility] arrived to transport patient home. Patient had first refused to go and daughter had to be called to talk with patient. Patient finally agreed to go with them. Patient with dementia and gets agitated at times.

## 2015-05-04 ENCOUNTER — Emergency Department (HOSPITAL_COMMUNITY): Payer: Medicare Other

## 2015-05-04 ENCOUNTER — Encounter (HOSPITAL_COMMUNITY): Payer: Self-pay | Admitting: *Deleted

## 2015-05-04 ENCOUNTER — Inpatient Hospital Stay (HOSPITAL_COMMUNITY)
Admission: EM | Admit: 2015-05-04 | Discharge: 2015-05-08 | DRG: 699 | Disposition: A | Discharge status: Home Health | Payer: Medicare Other | Attending: Internal Medicine | Admitting: Internal Medicine

## 2015-05-04 DIAGNOSIS — E119 Type 2 diabetes mellitus without complications: Secondary | ICD-10-CM | POA: Diagnosis present

## 2015-05-04 DIAGNOSIS — Z8249 Family history of ischemic heart disease and other diseases of the circulatory system: Secondary | ICD-10-CM | POA: Diagnosis not present

## 2015-05-04 DIAGNOSIS — B965 Pseudomonas (aeruginosa) (mallei) (pseudomallei) as the cause of diseases classified elsewhere: Secondary | ICD-10-CM | POA: Diagnosis present

## 2015-05-04 DIAGNOSIS — A047 Enterocolitis due to Clostridium difficile: Secondary | ICD-10-CM | POA: Diagnosis present

## 2015-05-04 DIAGNOSIS — N390 Urinary tract infection, site not specified: Secondary | ICD-10-CM | POA: Diagnosis present

## 2015-05-04 DIAGNOSIS — N179 Acute kidney failure, unspecified: Secondary | ICD-10-CM | POA: Diagnosis present

## 2015-05-04 DIAGNOSIS — T8351XA Infection and inflammatory reaction due to indwelling urinary catheter, initial encounter: Secondary | ICD-10-CM | POA: Diagnosis present

## 2015-05-04 DIAGNOSIS — I10 Essential (primary) hypertension: Secondary | ICD-10-CM | POA: Diagnosis present

## 2015-05-04 DIAGNOSIS — I5030 Unspecified diastolic (congestive) heart failure: Secondary | ICD-10-CM | POA: Diagnosis present

## 2015-05-04 DIAGNOSIS — I69351 Hemiplegia and hemiparesis following cerebral infarction affecting right dominant side: Secondary | ICD-10-CM

## 2015-05-04 DIAGNOSIS — Z7401 Bed confinement status: Secondary | ICD-10-CM | POA: Diagnosis not present

## 2015-05-04 DIAGNOSIS — Z801 Family history of malignant neoplasm of trachea, bronchus and lung: Secondary | ICD-10-CM | POA: Diagnosis not present

## 2015-05-04 DIAGNOSIS — K274 Chronic or unspecified peptic ulcer, site unspecified, with hemorrhage: Secondary | ICD-10-CM

## 2015-05-04 DIAGNOSIS — Z66 Do not resuscitate: Secondary | ICD-10-CM | POA: Diagnosis present

## 2015-05-04 DIAGNOSIS — Z933 Colostomy status: Secondary | ICD-10-CM

## 2015-05-04 DIAGNOSIS — I714 Abdominal aortic aneurysm, without rupture: Secondary | ICD-10-CM | POA: Diagnosis present

## 2015-05-04 DIAGNOSIS — F17210 Nicotine dependence, cigarettes, uncomplicated: Secondary | ICD-10-CM | POA: Diagnosis present

## 2015-05-04 DIAGNOSIS — R1031 Right lower quadrant pain: Secondary | ICD-10-CM | POA: Diagnosis present

## 2015-05-04 DIAGNOSIS — Z8673 Personal history of transient ischemic attack (TIA), and cerebral infarction without residual deficits: Secondary | ICD-10-CM

## 2015-05-04 DIAGNOSIS — D649 Anemia, unspecified: Secondary | ICD-10-CM | POA: Diagnosis present

## 2015-05-04 DIAGNOSIS — B952 Enterococcus as the cause of diseases classified elsewhere: Secondary | ICD-10-CM | POA: Diagnosis present

## 2015-05-04 DIAGNOSIS — I480 Paroxysmal atrial fibrillation: Secondary | ICD-10-CM | POA: Diagnosis present

## 2015-05-04 DIAGNOSIS — Z886 Allergy status to analgesic agent status: Secondary | ICD-10-CM | POA: Diagnosis not present

## 2015-05-04 DIAGNOSIS — Z791 Long term (current) use of non-steroidal anti-inflammatories (NSAID): Secondary | ICD-10-CM

## 2015-05-04 DIAGNOSIS — F0390 Unspecified dementia without behavioral disturbance: Secondary | ICD-10-CM | POA: Diagnosis present

## 2015-05-04 DIAGNOSIS — Z79891 Long term (current) use of opiate analgesic: Secondary | ICD-10-CM

## 2015-05-04 DIAGNOSIS — Z86010 Personal history of colonic polyps: Secondary | ICD-10-CM

## 2015-05-04 DIAGNOSIS — Y846 Urinary catheterization as the cause of abnormal reaction of the patient, or of later complication, without mention of misadventure at the time of the procedure: Secondary | ICD-10-CM | POA: Diagnosis present

## 2015-05-04 DIAGNOSIS — T8351XD Infection and inflammatory reaction due to indwelling urinary catheter, subsequent encounter: Secondary | ICD-10-CM | POA: Diagnosis not present

## 2015-05-04 DIAGNOSIS — K7460 Unspecified cirrhosis of liver: Secondary | ICD-10-CM | POA: Diagnosis present

## 2015-05-04 DIAGNOSIS — Z90710 Acquired absence of both cervix and uterus: Secondary | ICD-10-CM

## 2015-05-04 DIAGNOSIS — Z9049 Acquired absence of other specified parts of digestive tract: Secondary | ICD-10-CM | POA: Diagnosis present

## 2015-05-04 DIAGNOSIS — J449 Chronic obstructive pulmonary disease, unspecified: Secondary | ICD-10-CM | POA: Diagnosis present

## 2015-05-04 DIAGNOSIS — K922 Gastrointestinal hemorrhage, unspecified: Secondary | ICD-10-CM | POA: Diagnosis present

## 2015-05-04 DIAGNOSIS — K921 Melena: Secondary | ICD-10-CM | POA: Diagnosis not present

## 2015-05-04 DIAGNOSIS — Z85038 Personal history of other malignant neoplasm of large intestine: Secondary | ICD-10-CM

## 2015-05-04 DIAGNOSIS — Z7982 Long term (current) use of aspirin: Secondary | ICD-10-CM

## 2015-05-04 DIAGNOSIS — Z79899 Other long term (current) drug therapy: Secondary | ICD-10-CM | POA: Diagnosis not present

## 2015-05-04 DIAGNOSIS — A0472 Enterocolitis due to Clostridium difficile, not specified as recurrent: Secondary | ICD-10-CM

## 2015-05-04 DIAGNOSIS — T83511A Infection and inflammatory reaction due to indwelling urethral catheter, initial encounter: Secondary | ICD-10-CM

## 2015-05-04 DIAGNOSIS — I509 Heart failure, unspecified: Secondary | ICD-10-CM | POA: Diagnosis present

## 2015-05-04 DIAGNOSIS — N3289 Other specified disorders of bladder: Secondary | ICD-10-CM | POA: Diagnosis present

## 2015-05-04 DIAGNOSIS — Z8711 Personal history of peptic ulcer disease: Secondary | ICD-10-CM

## 2015-05-04 LAB — CBC
HEMATOCRIT: 35.1 % — AB (ref 36.0–46.0)
Hemoglobin: 10.7 g/dL — ABNORMAL LOW (ref 12.0–15.0)
MCH: 27.2 pg (ref 26.0–34.0)
MCHC: 30.5 g/dL (ref 30.0–36.0)
MCV: 89.1 fL (ref 78.0–100.0)
PLATELETS: 342 10*3/uL (ref 150–400)
RBC: 3.94 MIL/uL (ref 3.87–5.11)
RDW: 15.3 % (ref 11.5–15.5)
WBC: 9 10*3/uL (ref 4.0–10.5)

## 2015-05-04 LAB — URINALYSIS, ROUTINE W REFLEX MICROSCOPIC
Bilirubin Urine: NEGATIVE
Glucose, UA: NEGATIVE mg/dL
KETONES UR: NEGATIVE mg/dL
Nitrite: POSITIVE — AB
Protein, ur: 100 mg/dL — AB
Specific Gravity, Urine: 1.018 (ref 1.005–1.030)
UROBILINOGEN UA: 0.2 mg/dL (ref 0.0–1.0)
pH: 5.5 (ref 5.0–8.0)

## 2015-05-04 LAB — COMPREHENSIVE METABOLIC PANEL
ALBUMIN: 3 g/dL — AB (ref 3.5–5.0)
ALT: 6 U/L — AB (ref 14–54)
ANION GAP: 9 (ref 5–15)
AST: 20 U/L (ref 15–41)
Alkaline Phosphatase: 53 U/L (ref 38–126)
BUN: 24 mg/dL — ABNORMAL HIGH (ref 6–20)
CALCIUM: 8.9 mg/dL (ref 8.9–10.3)
CO2: 23 mmol/L (ref 22–32)
Chloride: 111 mmol/L (ref 101–111)
Creatinine, Ser: 0.92 mg/dL (ref 0.44–1.00)
GFR calc non Af Amer: 56 mL/min — ABNORMAL LOW (ref >60)
Glucose, Bld: 124 mg/dL — ABNORMAL HIGH (ref 65–99)
Potassium: 4.2 mmol/L (ref 3.5–5.1)
Sodium: 143 mmol/L (ref 135–145)
Total Bilirubin: 0.6 mg/dL (ref 0.3–1.2)
Total Protein: 6.7 g/dL (ref 6.5–8.1)

## 2015-05-04 LAB — URINE MICROSCOPIC-ADD ON

## 2015-05-04 LAB — CBC WITH DIFFERENTIAL/PLATELET
BASOS ABS: 0 10*3/uL (ref 0.0–0.1)
BASOS PCT: 0 % (ref 0–1)
EOS ABS: 0.5 10*3/uL (ref 0.0–0.7)
Eosinophils Relative: 5 % (ref 0–5)
HCT: 35 % — ABNORMAL LOW (ref 36.0–46.0)
Hemoglobin: 10.8 g/dL — ABNORMAL LOW (ref 12.0–15.0)
LYMPHS ABS: 2.8 10*3/uL (ref 0.7–4.0)
LYMPHS PCT: 32 % (ref 12–46)
MCH: 27.8 pg (ref 26.0–34.0)
MCHC: 30.9 g/dL (ref 30.0–36.0)
MCV: 90.2 fL (ref 78.0–100.0)
MONO ABS: 0.5 10*3/uL (ref 0.1–1.0)
Monocytes Relative: 6 % (ref 3–12)
NEUTROS ABS: 5 10*3/uL (ref 1.7–7.7)
NEUTROS PCT: 57 % (ref 43–77)
Platelets: 368 10*3/uL (ref 150–400)
RBC: 3.88 MIL/uL (ref 3.87–5.11)
RDW: 15.7 % — AB (ref 11.5–15.5)
WBC: 8.8 10*3/uL (ref 4.0–10.5)

## 2015-05-04 LAB — PROTIME-INR
INR: 1.11 (ref 0.00–1.49)
Prothrombin Time: 14.5 s (ref 11.6–15.2)

## 2015-05-04 LAB — LIPASE, BLOOD: Lipase: 32 U/L (ref 22–51)

## 2015-05-04 LAB — TYPE AND SCREEN
ABO/RH(D): A POS
Antibody Screen: NEGATIVE

## 2015-05-04 LAB — APTT: APTT: 29 s (ref 24–37)

## 2015-05-04 LAB — POC OCCULT BLOOD, ED: Fecal Occult Bld: POSITIVE — AB

## 2015-05-04 MED ORDER — ACETAMINOPHEN 325 MG PO TABS: 650.0000 mg | ORAL_TABLET | Freq: Four times a day (QID) | ORAL | Status: DC | PRN

## 2015-05-04 MED ORDER — SODIUM CHLORIDE 0.9 % IV SOLN: INTRAVENOUS | Status: DC

## 2015-05-04 MED ORDER — QUETIAPINE FUMARATE 25 MG PO TABS
25.0000 mg | ORAL_TABLET | Freq: Every day | ORAL | Status: DC
  Administered 2015-05-04 – 2015-05-07 (×4): 25 mg via ORAL
  Filled 2015-05-04 (×5): qty 1

## 2015-05-04 MED ORDER — TRAMADOL HCL 50 MG PO TABS
50.0000 mg | ORAL_TABLET | Freq: Four times a day (QID) | ORAL | Status: DC | PRN
  Administered 2015-05-06 – 2015-05-07 (×3): 50 mg via ORAL
  Filled 2015-05-04 (×3): qty 1

## 2015-05-04 MED ORDER — MORPHINE SULFATE 2 MG/ML IJ SOLN
2.0000 mg | Freq: Once | INTRAMUSCULAR | Status: AC
  Administered 2015-05-04: 2 mg via INTRAVENOUS
  Filled 2015-05-04: qty 1

## 2015-05-04 MED ORDER — CEFTRIAXONE SODIUM 1 G IJ SOLR
1.0000 g | Freq: Once | INTRAMUSCULAR | Status: AC
  Administered 2015-05-04: 1 g via INTRAVENOUS
  Filled 2015-05-04: qty 10

## 2015-05-04 MED ORDER — ONDANSETRON HCL 4 MG/2ML IJ SOLN: 4.0000 mg | Freq: Four times a day (QID) | INTRAMUSCULAR | Status: DC | PRN

## 2015-05-04 MED ORDER — ACETAMINOPHEN 650 MG RE SUPP: 650.0000 mg | Freq: Four times a day (QID) | RECTAL | Status: DC | PRN

## 2015-05-04 MED ORDER — SODIUM CHLORIDE 0.9 % IV SOLN
Freq: Once | INTRAVENOUS | Status: AC
  Administered 2015-05-04: 16:00:00 via INTRAVENOUS

## 2015-05-04 MED ORDER — GABAPENTIN 100 MG PO CAPS
200.0000 mg | ORAL_CAPSULE | Freq: Three times a day (TID) | ORAL | Status: DC
  Administered 2015-05-04 – 2015-05-08 (×11): 200 mg via ORAL
  Filled 2015-05-04 (×13): qty 2

## 2015-05-04 MED ORDER — ONDANSETRON HCL 4 MG PO TABS: 4.0000 mg | ORAL_TABLET | Freq: Four times a day (QID) | ORAL | Status: DC | PRN

## 2015-05-04 MED ORDER — MORPHINE SULFATE 2 MG/ML IJ SOLN: 1.0000 mg | INTRAMUSCULAR | Status: DC | PRN

## 2015-05-04 MED ORDER — IOHEXOL 300 MG/ML  SOLN
100.0000 mL | Freq: Once | INTRAMUSCULAR | Status: AC | PRN
  Administered 2015-05-04: 100 mL via INTRAVENOUS

## 2015-05-04 MED ORDER — HYDRALAZINE HCL 20 MG/ML IJ SOLN
10.0000 mg | INTRAMUSCULAR | Status: DC | PRN
  Administered 2015-05-06: 10 mg via INTRAVENOUS
  Filled 2015-05-04: qty 1

## 2015-05-04 MED ORDER — HYDROMORPHONE HCL 1 MG/ML IJ SOLN
0.5000 mg | INTRAMUSCULAR | Status: DC | PRN
Start: 2015-05-04 — End: 2015-05-04

## 2015-05-04 MED ORDER — DEXTROSE 5 % IV SOLN
1.0000 g | INTRAVENOUS | Status: DC
  Administered 2015-05-05 – 2015-05-06 (×2): 1 g via INTRAVENOUS
  Filled 2015-05-04 (×3): qty 10

## 2015-05-04 MED ORDER — PROCHLORPERAZINE MALEATE 10 MG PO TABS: 10.0000 mg | ORAL_TABLET | Freq: Three times a day (TID) | ORAL | Status: DC | PRN

## 2015-05-04 MED ORDER — ONDANSETRON HCL 4 MG/2ML IJ SOLN: 4.0000 mg | Freq: Three times a day (TID) | INTRAMUSCULAR | Status: DC | PRN

## 2015-05-04 MED ORDER — NITROGLYCERIN 0.4 MG SL SUBL: 0.4000 mg | SUBLINGUAL_TABLET | SUBLINGUAL | Status: DC | PRN

## 2015-05-04 MED ORDER — PANTOPRAZOLE SODIUM 40 MG IV SOLR
40.0000 mg | Freq: Two times a day (BID) | INTRAVENOUS | Status: DC
  Administered 2015-05-04 – 2015-05-07 (×6): 40 mg via INTRAVENOUS
  Filled 2015-05-04 (×9): qty 40

## 2015-05-04 NOTE — ED Notes (Signed)
MD at bedside. 

## 2015-05-04 NOTE — ED Notes (Signed)
Bed: WA17 Expected date:  Expected time:  Means of arrival:  Comments: EMS-Poss UTI

## 2015-05-04 NOTE — ED Provider Notes (Signed)
CSN: [id number]     Arrival date & time 05/04/15  1256 History   First MD Initiated Contact with Patient 05/04/15 1451     Chief Complaint  Patient presents with  . Abdominal Pain  . Urinary Tract Infection     (Consider location/radiation/quality/duration/timing/severity/associated sxs/prior Treatment) HPI 81 year old female who presents with right-sided abdominal pain. She has a history of diastolic heart failure, COPD, atrial fibrillation, and prior colon cancer status post partial colectomy with colostomy. She has a chronic indwelling Foley, and recently treated for urinary tract infection one month ago. Reports that she has otherwise been doing well, but woke up from sleep this morning with severe right-sided abdominal pain. Over the course of the past 2 days she has noticed increased colostomy output that appears more dark in appearance. She has also been noticing increased sediments and odor coming from her Foley catheter. Denies fever, chills, night sweats, nausea, vomiting, bloody output from her colostomy, back pain or flank pain. Past Medical History  Diagnosis Date  . COPD (chronic obstructive pulmonary disease)   . CHF (congestive heart failure)     ECHO 2011  EF: 60% to 19%, grd 1 distolic dysfxn, 50-93% 2671 ECHO, tech limited by Afib  . Hypertension   . Stroke 2008    rt side deficit  . A-fib   . Back pain   . Constipation   . SBO (small bowel obstruction)     per GI note in 2011, pt denies  . Upper GI bleed 2011    required hospitalization  . Colon polyps   . Tobacco abuse   . Diabetes mellitus     per daughter doctor took patient off DM medication 6 months ago   . Cancer   . Colon cancer   . Cirrhosis of liver    Past Surgical History  Procedure Laterality Date  . Total abdominal hysterectomy  1980  . Colonoscopy N/A 09/04/2013    Procedure: COLONOSCOPY;  Surgeon: Lear Ng, MD;  Location: [facility] ENDOSCOPY;  Service: Endoscopy;  Laterality: N/A;  . Colon  resection N/A 09/06/2013    Procedure: PARTIAL COLECTOMY WITH COLOSTOMY;  Surgeon: Earnstine Regal, MD;  Location: [place];  Service: General;  Laterality: N/A;  . Colostomy revision N/A 09/08/2013    Procedure: COLOSTOMY REVISION;  Surgeon: Gwenyth Ober, MD;  Location: [place];  Service: General;  Laterality: N/A;  . Colostomy     Family History  Problem Relation Age of Onset  . Lung cancer Mother   . Heart disease Father    History  Substance Use Topics  . Smoking status: Current Some Day Smoker -- 0.50 packs/day for 15 years    Types: Cigarettes  . Smokeless tobacco: Never Used  . Alcohol Use: No   OB History    No data available     Review of Systems  10/14 systems reviewed and are negative other than those stated in the HPI   Allergies  Aggrenox  Home Medications   Prior to Admission medications   Medication Sig Start Date End Date Taking? Authorizing Provider  aspirin EC 81 MG EC tablet Take 1 tablet (81 mg total) by mouth daily. 01/25/15  Yes Albertine Patricia, MD  gabapentin (NEURONTIN) 100 MG capsule Take 2 capsules (200 mg total) by mouth 3 (three) times daily. 01/25/15  Yes Albertine Patricia, MD  Naproxen Sodium (ALEVE) 220 MG CAPS Take 220 mg by mouth every 12 (twelve) hours as needed (pain).  Yes Historical Provider, MD  nitroGLYCERIN (NITROSTAT) 0.4 MG SL tablet Place 0.4 mg under the tongue every 5 (five) minutes as needed for chest pain.   Yes Historical Provider, MD  oxyCODONE (OXY IR/ROXICODONE) 5 MG immediate release tablet Take 5 mg by mouth every 6 (six) hours as needed for moderate pain or severe pain.   Yes Historical Provider, MD  prochlorperazine (COMPAZINE) 10 MG tablet Take 10 mg by mouth every 8 (eight) hours as needed for nausea or vomiting.   Yes Historical Provider, MD  QUEtiapine (SEROQUEL) 25 MG tablet Take 25 mg by mouth at bedtime.  01/03/15  Yes Historical Provider, MD  traMADol (ULTRAM) 50 MG tablet Take 1 tablet (50 mg total) by mouth every 6  (six) hours as needed. pain 04/11/15  Yes Janece Canterbury, MD  famotidine (PEPCID) 20 MG tablet Take 1 tablet (20 mg total) by mouth 2 (two) times daily. Patient not taking: Reported on 05/04/2015 04/11/15   Janece Canterbury, MD  saccharomyces boulardii (FLORASTOR) 250 MG capsule Take 1 capsule (250 mg total) by mouth 2 (two) times daily. Patient not taking: Reported on 05/04/2015 04/11/15   Janece Canterbury, MD   BP 131/47 mmHg  Pulse 62  Temp(Src) 97.7 F (36.5 C) (Oral)  Resp 16  Ht 5\' 3"  (1.6 m)  Wt 153 lb 1.6 oz (69.446 kg)  BMI 27.13 kg/m2  SpO2 99% Physical Exam  Physical Exam  Nursing note and vitals reviewed. Constitutional: Well developed, well nourished, non-toxic, and in no acute distress Head: Normocephalic and atraumatic.  Mouth/Throat: Oropharynx is clear and moist.  Neck: Normal range of motion. Neck supple.  Cardiovascular: Normal rate and regular rhythm.   Pulmonary/Chest: Effort normal and breath sounds normal.  Abdominal: Soft. Obese. There is RLQ and suprapubic tenderness. There is colostomy in LLQ, draining dark brown stool. Heme-occult positive. There is no rebound and no guarding.  GU: Foley catheter in place, draining cloudy light yellow urine with sediments Musculoskeletal: Normal range of motion.  Neurological: Alert, no facial droop, fluent speech, moves all extremities symmetrically Skin: Skin is warm and dry.  Psychiatric: Cooperative   ED Course  Procedures (including critical care time) Labs Review Labs Reviewed  C DIFFICILE QUICK SCAN W PCR REFLEX - Abnormal; Notable for the following:    C Diff antigen POSITIVE (*)    All other components within normal limits  URINALYSIS, ROUTINE W REFLEX MICROSCOPIC (NOT AT [facility]) - Abnormal; Notable for the following:    APPearance TURBID (*)    Hgb urine dipstick MODERATE (*)    Protein, ur 100 (*)    Nitrite POSITIVE (*)    Leukocytes, UA LARGE (*)    All other components within normal limits  CBC WITH  DIFFERENTIAL/PLATELET - Abnormal; Notable for the following:    Hemoglobin 10.8 (*)    HCT 35.0 (*)    RDW 15.7 (*)    All other components within normal limits  COMPREHENSIVE METABOLIC PANEL - Abnormal; Notable for the following:    Glucose, Bld 124 (*)    BUN 24 (*)    Albumin 3.0 (*)    ALT 6 (*)    GFR calc non Af Amer 56 (*)    All other components within normal limits  URINE MICROSCOPIC-ADD ON - Abnormal; Notable for the following:    Squamous Epithelial / LPF MANY (*)    Bacteria, UA MANY (*)    All other components within normal limits  CBC - Abnormal; Notable for the  following:    Hemoglobin 10.7 (*)    HCT 35.1 (*)    All other components within normal limits  CBC - Abnormal; Notable for the following:    Hemoglobin 10.9 (*)    HCT 35.9 (*)    All other components within normal limits  CBC - Abnormal; Notable for the following:    RBC 3.81 (*)    Hemoglobin 10.5 (*)    HCT 34.2 (*)    RDW 15.6 (*)    All other components within normal limits  CBC - Abnormal; Notable for the following:    RBC 3.81 (*)    Hemoglobin 10.5 (*)    HCT 34.6 (*)    RDW 15.6 (*)    All other components within normal limits  COMPREHENSIVE METABOLIC PANEL - Abnormal; Notable for the following:    Glucose, Bld 121 (*)    Calcium 7.9 (*)    Total Protein 5.8 (*)    Albumin 2.7 (*)    AST 14 (*)    ALT 6 (*)    Total Bilirubin 0.2 (*)    All other components within normal limits  POC OCCULT BLOOD, ED - Abnormal; Notable for the following:    Fecal Occult Bld POSITIVE (*)    All other components within normal limits  URINE CULTURE  LIPASE, BLOOD  PROTIME-INR  APTT  TYPE AND SCREEN    Imaging Review Ct Abdomen Pelvis W Contrast  05/04/2015   CLINICAL DATA:  RIGHT-side abdominal pain, recent pyelonephritis, progressive RIGHT lower quadrant pain in last 3 days, past history COPD, CHF, hypertension, stroke, atrial fibrillation, diabetes mellitus, colon cancer, cirrhosis  EXAM: CT  ABDOMEN AND PELVIS WITH CONTRAST  TECHNIQUE: Multidetector CT imaging of the abdomen and pelvis was performed using the standard protocol following bolus administration of intravenous contrast. Sagittal and coronal MPR images reconstructed from axial data set.  CONTRAST:  163mL OMNIPAQUE IOHEXOL 300 MG/ML SOLN IV. No oral contrast administered.  COMPARISON:  03/31/2015  FINDINGS: Chronic atelectasis RIGHT lower lobe.  Stable low-attenuation lesion RIGHT lobe liver 9 x 6 mm image 23.  Additional fat attenuation lesion within liver 18 x 18 mm image 28 unchanged.  Dependent gallstones in gallbladder.  Cortical thinning of both kidneys.  Focus of nephrographic impairment at the posterior aspect of the mid LEFT kidney is unchanged.  Calcifications in both kidneys predominantly vascular without hydronephrosis or renal mass.  Scattered splenic calcifications without mass, could be due to old granulomatous disease or areas of prior hemorrhage/trauma.  Liver, spleen, pancreas, kidneys, and adrenal glands otherwise normal.  Extensive atherosclerotic calcifications including coronary arteries and numerous abdominal branch vessels.  Aneurysmal dilatation of mid abdominal aorta, 3.2 x 2.6 cm image 32.  Intraluminal thrombus at distal descending thoracic aorta.  Foley catheter decompresses urinary bladder.  Uterus surgically absent with nonvisualization of ovaries.  Normal appendix.  No ureteral calcification or dilatation.  Stranding of presacral fat planes unchanged.  Mid transverse colostomy with long Hartmann pouch to the proximal sigmoid colon.  Stomach and small bowel loops unremarkable.  No mass, adenopathy, free air or free fluid.  Diffuse osseous demineralization.  IMPRESSION: Extensive atherosclerotic calcification with mild aneurysmal dilatation of the mid abdominal aorta up to 3.2 x 2.6 cm.  This includes extensive calcifications at the origins of the celiac artery, SMA, and renal arteries.  Mid transverse colostomy.   Chronic zone of nephrographic impairment at the mid LEFT kidney, unchanged from previous exam, could be related to pyelonephritis.  Cholelithiasis.  No acute intra-abdominal or intrapelvic abnormalities otherwise identified.   Electronically Signed   By: Lavonia Karol Skarzynski M.D.   On: 05/04/2015 17:30     EKG Interpretation None      MDM   Final diagnoses:  Gastrointestinal hemorrhage associated with peptic ulcer  Urinary tract infection associated with catheterization of urinary tract, initial encounter    In short, this is an 81 year old female with history of partial colectomy with colostomy, diastolic heart failure, chronic indwelling foley, who presents with abdominal pain, changes in foley output, and changes in colostomy output. She is AF and vital signs within normal limits. Non-toxic in appearance. She has soft abdomen with suprapubic and RLQ tenderness to palpation. No peritoneal signs. Dark stool in colostomy, guaiac positive. Concern for GI bleed, and history of upper GI bleed in past 2/2 ulcer. Hgb is stable and remains hemodynamically stable.  CT abd/pelvis visualized and reveals no acute intra-abdominal processes to explain abdominal pain. IN setting of indwelling foley, chronic colonization, but concern for cystitis as c/o suprapubic pain and changes in urine. Given 1g ceftriaxone.   Discussed with Dr. Dreama Saa and admitted to [facility] for serial hemoglobin, GI consult, serial abdominal exams, and treatment for UTI.   Forde Dandy, MD 05/05/15 [phone]

## 2015-05-04 NOTE — H&P (Signed)
[facility] History and Physical  [name] NTI:[id number] DOB: 05-30-1930 DOA: 05/04/2015  Referring physician: Ms. Oleta Mouse. PCP: [name], MD  Specialists: none.  Chief Complaint: Abdominal pain.  HPI: [name] is a 81 y.o. female with history of CVA with right-sided hemiparesis, CA colon status post colostomy bag placement, chronic indwelling catheter, chronic anemia, history of A. Fib presently on no anticoagulation was brought to the ER after patient was brought to the ER after patient was complaining of lower abdominal pain. Patient is complaining of suprapubic pain and right lower quadrant pain. Patient did not have any nausea vomiting but patient's daughter noted that patient was having increasing dark colostomy output over the last couple of days. Stool for blood was positive. UA was showing features concerning for UTI. CT abdomen and pelvis did not show anything acute except for abdominal aneurysm. Patient has been admitted for further management of UTI and possible GI bleed. Patient is on aspirin and endoscopy done in 2011 showed peptic ulcer disease and colonoscopy done in 2014 showed hemorrhoids. Patient is otherwise hemodynamically stable. Patient's abdominal pain improved with pain relief medications and presently is asymptomatic.  Review of Systems: As presented in the history of presenting illness, rest negative.  Past Medical History  Diagnosis Date  . COPD (chronic obstructive pulmonary disease)   . CHF (congestive heart failure)     ECHO 2011  EF: 60% to 86%, grd 1 distolic dysfxn, 76-19% 5093 ECHO, tech limited by Afib  . Hypertension   . Stroke 2008    rt side deficit  . A-fib   . Back pain   . Constipation   . SBO (small bowel obstruction)     per GI note in 2011, pt denies  . Upper GI bleed 2011    required hospitalization  . Colon polyps   . Tobacco abuse   . Diabetes mellitus     per daughter doctor took patient off DM medication 6 months  ago   . Cancer   . Colon cancer   . Cirrhosis of liver    Past Surgical History  Procedure Laterality Date  . Total abdominal hysterectomy  1980  . Colonoscopy N/A 09/04/2013    Procedure: COLONOSCOPY;  Surgeon: Lear Ng, MD;  Location: [facility] ENDOSCOPY;  Service: Endoscopy;  Laterality: N/A;  . Colon resection N/A 09/06/2013    Procedure: PARTIAL COLECTOMY WITH COLOSTOMY;  Surgeon: Earnstine Regal, MD;  Location: [place];  Service: General;  Laterality: N/A;  . Colostomy revision N/A 09/08/2013    Procedure: COLOSTOMY REVISION;  Surgeon: Gwenyth Ober, MD;  Location: [place];  Service: General;  Laterality: N/A;  . Colostomy     Social History:  reports that she has been smoking Cigarettes.  She has a 7.5 pack-year smoking history. She has never used smokeless tobacco. She reports that she does not drink alcohol or use illicit drugs. Where does patient live home. Can patient participate in ADLs? No.  Allergies  Allergen Reactions  . Aggrenox [Aspirin-Dipyridamole Er] Diarrhea and Nausea And Vomiting    & abdominal bleeding    Family History:  Family History  Problem Relation Age of Onset  . Lung cancer Mother   . Heart disease Father       Prior to Admission medications   Medication Sig Start Date End Date Taking? Authorizing Provider  aspirin EC 81 MG EC tablet Take 1 tablet (81 mg total) by mouth daily. 01/25/15  Yes Dawood S Elgergawy,  MD  gabapentin (NEURONTIN) 100 MG capsule Take 2 capsules (200 mg total) by mouth 3 (three) times daily. 01/25/15  Yes Albertine Patricia, MD  Naproxen Sodium (ALEVE) 220 MG CAPS Take 220 mg by mouth every 12 (twelve) hours as needed (pain).   Yes Historical Provider, MD  nitroGLYCERIN (NITROSTAT) 0.4 MG SL tablet Place 0.4 mg under the tongue every 5 (five) minutes as needed for chest pain.   Yes Historical Provider, MD  oxyCODONE (OXY IR/ROXICODONE) 5 MG immediate release tablet Take 5 mg by mouth every 6 (six) hours as needed for moderate  pain or severe pain.   Yes Historical Provider, MD  prochlorperazine (COMPAZINE) 10 MG tablet Take 10 mg by mouth every 8 (eight) hours as needed for nausea or vomiting.   Yes Historical Provider, MD  QUEtiapine (SEROQUEL) 25 MG tablet Take 25 mg by mouth at bedtime.  01/03/15  Yes Historical Provider, MD  traMADol (ULTRAM) 50 MG tablet Take 1 tablet (50 mg total) by mouth every 6 (six) hours as needed. pain 04/11/15  Yes Janece Canterbury, MD  famotidine (PEPCID) 20 MG tablet Take 1 tablet (20 mg total) by mouth 2 (two) times daily. Patient not taking: Reported on 05/04/2015 04/11/15   Janece Canterbury, MD  saccharomyces boulardii (FLORASTOR) 250 MG capsule Take 1 capsule (250 mg total) by mouth 2 (two) times daily. Patient not taking: Reported on 05/04/2015 04/11/15   Janece Canterbury, MD    Physical Exam: Filed Vitals:   05/04/15 1718 05/04/15 1835 05/04/15 1946 05/04/15 2100  BP: 183/70 199/75 202/60 180/55  Pulse: 82 77 72 68  Temp: 98.1 F (36.7 C) 98 F (36.7 C)  98.3 F (36.8 C)  TempSrc: Oral Oral  Oral  Resp: 16 18 18 20   Height:    5\' 3"  (1.6 m)  Weight:    69.446 kg (153 lb 1.6 oz)  SpO2: 95% 96% 97% 99%     General:  Moderately built and nourished.  Eyes: anicteric no pallor.  ENT: no discharge from the ears eyes nose and mouth.  Neck: no mass felt.  Cardiovascular: S1-S2 heard.  Respiratory: no rhonchi or crepitations.  Abdomen: soft nontender bowel sounds present. Colostomy bag seen.  Skin: no rash.  Musculoskeletal: no edema.  Psychiatric: appears normal.  Neurologic: alert awake oriented to time place and person. Right-sided hemiparesis.  Labs on Admission:  Basic Metabolic Panel:  Recent Labs Lab 05/04/15 1600  NA 143  K 4.2  CL 111  CO2 23  GLUCOSE 124*  BUN 24*  CREATININE 0.92  CALCIUM 8.9   Liver Function Tests:  Recent Labs Lab 05/04/15 1600  AST 20  ALT 6*  ALKPHOS 53  BILITOT 0.6  PROT 6.7  ALBUMIN 3.0*    Recent Labs Lab  05/04/15 1600  LIPASE 32   No results for input(s): AMMONIA in the last 168 hours. CBC:  Recent Labs Lab 05/04/15 1600  WBC 8.8  NEUTROABS 5.0  HGB 10.8*  HCT 35.0*  MCV 90.2  PLT 368   Cardiac Enzymes: No results for input(s): CKTOTAL, CKMB, CKMBINDEX, TROPONINI in the last 168 hours.  BNP (last 3 results) No results for input(s): BNP in the last 8760 hours.  ProBNP (last 3 results) No results for input(s): PROBNP in the last 8760 hours.  CBG: No results for input(s): GLUCAP in the last 168 hours.  Radiological Exams on Admission: Ct Abdomen Pelvis W Contrast  05/04/2015   CLINICAL DATA:  RIGHT-side abdominal pain, recent  pyelonephritis, progressive RIGHT lower quadrant pain in last 3 days, past history COPD, CHF, hypertension, stroke, atrial fibrillation, diabetes mellitus, colon cancer, cirrhosis  EXAM: CT ABDOMEN AND PELVIS WITH CONTRAST  TECHNIQUE: Multidetector CT imaging of the abdomen and pelvis was performed using the standard protocol following bolus administration of intravenous contrast. Sagittal and coronal MPR images reconstructed from axial data set.  CONTRAST:  122mL OMNIPAQUE IOHEXOL 300 MG/ML SOLN IV. No oral contrast administered.  COMPARISON:  03/31/2015  FINDINGS: Chronic atelectasis RIGHT lower lobe.  Stable low-attenuation lesion RIGHT lobe liver 9 x 6 mm image 23.  Additional fat attenuation lesion within liver 18 x 18 mm image 28 unchanged.  Dependent gallstones in gallbladder.  Cortical thinning of both kidneys.  Focus of nephrographic impairment at the posterior aspect of the mid LEFT kidney is unchanged.  Calcifications in both kidneys predominantly vascular without hydronephrosis or renal mass.  Scattered splenic calcifications without mass, could be due to old granulomatous disease or areas of prior hemorrhage/trauma.  Liver, spleen, pancreas, kidneys, and adrenal glands otherwise normal.  Extensive atherosclerotic calcifications including coronary arteries  and numerous abdominal branch vessels.  Aneurysmal dilatation of mid abdominal aorta, 3.2 x 2.6 cm image 32.  Intraluminal thrombus at distal descending thoracic aorta.  Foley catheter decompresses urinary bladder.  Uterus surgically absent with nonvisualization of ovaries.  Normal appendix.  No ureteral calcification or dilatation.  Stranding of presacral fat planes unchanged.  Mid transverse colostomy with long Hartmann pouch to the proximal sigmoid colon.  Stomach and small bowel loops unremarkable.  No mass, adenopathy, free air or free fluid.  Diffuse osseous demineralization.  IMPRESSION: Extensive atherosclerotic calcification with mild aneurysmal dilatation of the mid abdominal aorta up to 3.2 x 2.6 cm.  This includes extensive calcifications at the origins of the celiac artery, SMA, and renal arteries.  Mid transverse colostomy.  Chronic zone of nephrographic impairment at the mid LEFT kidney, unchanged from previous exam, could be related to pyelonephritis.  Cholelithiasis.  No acute intra-abdominal or intrapelvic abnormalities otherwise identified.   Electronically Signed   By: Lavonia Dana M.D.   On: 05/04/2015 17:30     Assessment/Plan Active Problems:   Paroxysmal atrial fibrillation   Hypertension   H/O: CVA (cerebrovascular accident)- with R Hemiparesis/contractures   UTI (urinary tract infection) due to urinary indwelling catheter   GI bleed   Urinary tract infectious disease   1. Catheter associated UTI - patient was recently admitted 3 weeks ago for UTI and at that time grew Pseudomonas in urine culture. At this time patient has been empirically placed on ceftriaxone for urine cultures. Patient's catheter was changed last time on August 1 as per the family. 2. GI bleed - hold aspirin for now. Follow CBC. Patient is placed on Protonix IV. Patient has had previous history of peptic ulcer disease as per 2011 EGD. Patient is presently hemodynamically stable. 3. History of CVA with  right-sided hemiparesis - holding of aspirin due to GI bleed. 4. History of paroxysmal atrial fibrillation - not on any rate limiting medications. 5. Chronic anemia - follow CBC.  I have reviewed patient's old charts and labs.   DVT ProphylaxisSCDs.  Code Status: DO NOT RESUSCITATE.  Family Communication: patient's daughter.  Disposition Plan: admit to inpatient.    Ritu Gagliardo N. [facility] Pager [phone].  If 7PM-7AM, please contact night-coverage [email or web address] Password [facility] 05/04/2015, 9:43 PM

## 2015-05-04 NOTE — Progress Notes (Signed)
ANTIBIOTIC CONSULT NOTE - INITIAL  Pharmacy Consult for Ceftriaxone Indication: UTI  Allergies  Allergen Reactions  . Aggrenox [Aspirin-Dipyridamole Er] Diarrhea and Nausea And Vomiting    & abdominal bleeding    Patient Measurements: Height: 5\' 3"  (160 cm) Weight: 153 lb 1.6 oz (69.446 kg) IBW/kg (Calculated) : 52.4 Adjusted Body Weight:   Vital Signs: Temp: 98.3 F (36.8 C) (08/07 2100) Temp Source: Oral (08/07 2100) BP: 180/55 mmHg (08/07 2100) Pulse Rate: 68 (08/07 2100) Intake/Output from previous day:   Intake/Output from this shift: Total I/O In: -  Out: 850 [Urine:850]  Labs:  Recent Labs  05/04/15 1600  WBC 8.8  HGB 10.8*  PLT 368  CREATININE 0.92   Estimated Creatinine Clearance: 43.3 mL/min (by C-G formula based on Cr of 0.92). No results for input(s): VANCOTROUGH, VANCOPEAK, VANCORANDOM, GENTTROUGH, GENTPEAK, GENTRANDOM, TOBRATROUGH, TOBRAPEAK, TOBRARND, AMIKACINPEAK, AMIKACINTROU, AMIKACIN in the last 72 hours.   Microbiology: Recent Results (from the past 720 hour(s))  Urine culture     Status: None   Collection Time: 04/08/15  5:19 PM  Result Value Ref Range Status   Specimen Description URINE, CATHETERIZED  Final   Special Requests NONE  Final   Culture   Final    >=100,000 COLONIES/mL PSEUDOMONAS AERUGINOSA Performed at [facility]    Report Status 04/11/2015 FINAL  Final   Organism ID, Bacteria PSEUDOMONAS AERUGINOSA  Final      Susceptibility   Pseudomonas aeruginosa - MIC*    CEFTAZIDIME 4 SENSITIVE Sensitive     CIPROFLOXACIN >=4 RESISTANT Resistant     GENTAMICIN <=1 SENSITIVE Sensitive     IMIPENEM >=16 RESISTANT Resistant     PIP/TAZO 32 SENSITIVE Sensitive     CEFEPIME 4 SENSITIVE Sensitive     * >=100,000 COLONIES/mL PSEUDOMONAS AERUGINOSA    Medical History: Past Medical History  Diagnosis Date  . COPD (chronic obstructive pulmonary disease)   . CHF (congestive heart failure)     ECHO 2011  EF: 60% to 46%, grd  1 distolic dysfxn, 65-99% 3570 ECHO, tech limited by Afib  . Hypertension   . Stroke 2008    rt side deficit  . A-fib   . Back pain   . Constipation   . SBO (small bowel obstruction)     per GI note in 2011, pt denies  . Upper GI bleed 2011    required hospitalization  . Colon polyps   . Tobacco abuse   . Diabetes mellitus     per daughter doctor took patient off DM medication 6 months ago   . Cancer   . Colon cancer   . Cirrhosis of liver     Medications:  Anti-infectives    Start     Dose/Rate Route Frequency Ordered Stop   05/05/15 1800  cefTRIAXone (ROCEPHIN) 1 g in dextrose 5 % 50 mL IVPB     1 g 100 mL/hr over 30 Minutes Intravenous Every 24 hours 05/04/15 2147     05/04/15 1830  cefTRIAXone (ROCEPHIN) 1 g in dextrose 5 % 50 mL IVPB     1 g 100 mL/hr over 30 Minutes Intravenous  Once 05/04/15 1815 05/04/15 1943     Assessment: Patient with UTI.  First dose of antibiotics already given.  Goal of Therapy:  Rocephin based on manufacturer dosing recommendations.  Plan: Ceftriaxone 1gm iv q24hr Follow up culture results  Nani Skillern Crowford 05/04/2015,9:47 PM

## 2015-05-04 NOTE — ED Notes (Signed)
Nurse is in room trying to collect labs

## 2015-05-04 NOTE — ED Notes (Signed)
Per EMS pt coming from home with c/o lower abdominal pain, currently on antibiotics for UTI. Per EMS pt was also hypertensive 230/70.

## 2015-05-05 DIAGNOSIS — A047 Enterocolitis due to Clostridium difficile: Secondary | ICD-10-CM

## 2015-05-05 DIAGNOSIS — T8351XA Infection and inflammatory reaction due to indwelling urinary catheter, initial encounter: Principal | ICD-10-CM

## 2015-05-05 DIAGNOSIS — N390 Urinary tract infection, site not specified: Secondary | ICD-10-CM

## 2015-05-05 DIAGNOSIS — I480 Paroxysmal atrial fibrillation: Secondary | ICD-10-CM

## 2015-05-05 DIAGNOSIS — Z8673 Personal history of transient ischemic attack (TIA), and cerebral infarction without residual deficits: Secondary | ICD-10-CM

## 2015-05-05 LAB — COMPREHENSIVE METABOLIC PANEL
ALBUMIN: 2.7 g/dL — AB (ref 3.5–5.0)
ALT: 6 U/L — AB (ref 14–54)
AST: 14 U/L — ABNORMAL LOW (ref 15–41)
Alkaline Phosphatase: 46 U/L (ref 38–126)
Anion gap: 5 (ref 5–15)
BILIRUBIN TOTAL: 0.2 mg/dL — AB (ref 0.3–1.2)
BUN: 18 mg/dL (ref 6–20)
CO2: 22 mmol/L (ref 22–32)
CREATININE: 0.83 mg/dL (ref 0.44–1.00)
Calcium: 7.9 mg/dL — ABNORMAL LOW (ref 8.9–10.3)
Chloride: 109 mmol/L (ref 101–111)
GFR calc Af Amer: >60 mL/min (ref >60)
GFR calc non Af Amer: >60 mL/min (ref >60)
GLUCOSE: 121 mg/dL — AB (ref 65–99)
Potassium: 3.5 mmol/L (ref 3.5–5.1)
SODIUM: 136 mmol/L (ref 135–145)
TOTAL PROTEIN: 5.8 g/dL — AB (ref 6.5–8.1)

## 2015-05-05 LAB — CBC
HCT: 35.9 % — ABNORMAL LOW (ref 36.0–46.0)
HCT: 34.6 % — ABNORMAL LOW (ref 36.0–46.0)
HEMATOCRIT: 34.2 % — AB (ref 36.0–46.0)
HEMOGLOBIN: 10.9 g/dL — AB (ref 12.0–15.0)
HEMOGLOBIN: 10.5 g/dL — AB (ref 12.0–15.0)
Hemoglobin: 10.5 g/dL — ABNORMAL LOW (ref 12.0–15.0)
MCH: 27.4 pg (ref 26.0–34.0)
MCH: 27.6 pg (ref 26.0–34.0)
MCH: 27.6 pg (ref 26.0–34.0)
MCHC: 30.4 g/dL (ref 30.0–36.0)
MCHC: 30.7 g/dL (ref 30.0–36.0)
MCHC: 30.3 g/dL (ref 30.0–36.0)
MCV: 90.2 fL (ref 78.0–100.0)
MCV: 89.8 fL (ref 78.0–100.0)
MCV: 90.8 fL (ref 78.0–100.0)
PLATELETS: 351 10*3/uL (ref 150–400)
PLATELETS: 319 10*3/uL (ref 150–400)
PLATELETS: 335 10*3/uL (ref 150–400)
RBC: 3.98 MIL/uL (ref 3.87–5.11)
RBC: 3.81 MIL/uL — AB (ref 3.87–5.11)
RBC: 3.81 MIL/uL — AB (ref 3.87–5.11)
RDW: 15.4 % (ref 11.5–15.5)
RDW: 15.6 % — AB (ref 11.5–15.5)
RDW: 15.6 % — AB (ref 11.5–15.5)
WBC: 8.9 10*3/uL (ref 4.0–10.5)
WBC: 8.6 10*3/uL (ref 4.0–10.5)
WBC: 8 10*3/uL (ref 4.0–10.5)

## 2015-05-05 LAB — C DIFFICILE QUICK SCREEN W PCR REFLEX
C DIFFICLE (CDIFF) ANTIGEN: POSITIVE — AB
C Diff toxin: NEGATIVE

## 2015-05-05 MED ORDER — VANCOMYCIN 50 MG/ML ORAL SOLUTION
125.0000 mg | Freq: Four times a day (QID) | ORAL | Status: DC
  Administered 2015-05-06 – 2015-05-08 (×10): 125 mg via ORAL
  Filled 2015-05-05 (×16): qty 2.5

## 2015-05-05 MED ORDER — PHENAZOPYRIDINE HCL 100 MG PO TABS
100.0000 mg | ORAL_TABLET | Freq: Three times a day (TID) | ORAL | Status: DC
  Administered 2015-05-06 – 2015-05-07 (×4): 100 mg via ORAL
  Filled 2015-05-05 (×7): qty 1

## 2015-05-05 MED ORDER — FLORANEX PO PACK
1.0000 g | PACK | Freq: Three times a day (TID) | ORAL | Status: DC
  Administered 2015-05-06 – 2015-05-08 (×6): 1 g via ORAL
  Filled 2015-05-05 (×12): qty 1

## 2015-05-05 NOTE — Progress Notes (Signed)
PROGRESS NOTE  [name] MCN:[id number] DOB: May 08, 1930 DOA: 05/04/2015 PCP: [name], MD  HPI/Recap of past 24 hours:  Feeling less bladder spasm, less left sided ab pain, no n/v, stool still loose. Patient has dementia, not able to provide history, daughter [name] provide most of history, [name] reported that patient started to have loose stool in colostomy bag a month ago,  Patient has frequent bladder spasm but spasm go away for 1-2weeks after being treated for UTI, then returns.  Assessment/Plan: Active Problems:   Paroxysmal atrial fibrillation   Hypertension   H/O: CVA (cerebrovascular accident)- with R Hemiparesis/contractures   UTI (urinary tract infection) due to urinary indwelling catheter   GI bleed   Urinary tract infectious disease  Recurrently UTI with indwelling foley, does has symptom with bladder spasm and superpubic tenderness. No fever, no leukocytosis. Urine culture pending, on rocephin, reported feeling better.  Right sided ab pain, loose stool for a month, c diff antigen + , toxin negative, daughter reported patient did not have loose stool until a months ago, stool occult blood positive, will treat for cdiff with oral vanc. Ct ab no significant acute findings, ? left sided pyelo, patient denies left sided pain. Ct ab+ cholelithiasis, lft wnl.  Stool occult blood positive: from cdiff colitis? H/h stable   Diet controlled diabetes: close monitor blood sugar.  H/o CVA, bedbound, total care at baseline  H/o dementia, baseline oriented to person, pleasant.  Code Status: DNR  Family Communication: patient and daughter  Disposition Plan: home when medically stable, likely in 1-2 days   Consultants:  none  Procedures:  none  Antibiotics:  Rocephin  Oral vanc   Objective: BP 131/42 mmHg  Pulse 55  Temp(Src) 98.5 F (36.9 C) (Oral)  Resp 18  Ht 5\' 3"  (1.6 m)  Wt 69.446 kg (153 lb 1.6 oz)  BMI 27.13 kg/m2  SpO2 97%  Intake/Output  Summary (Last 24 hours) at 05/05/15 1844 Last data filed at 05/05/15 1816  Gross per 24 hour  Intake    360 ml  Output   2000 ml  Net  -1640 ml   Filed Weights   05/04/15 2100  Weight: 69.446 kg (153 lb 1.6 oz)    Exam:   General:  NAD, pleasantly demented  Cardiovascular: RRR  Respiratory: CTABL  Abdomen: mild suprapubic tenderness,  Mild right sided ab tenderness, no guarding , no rebound, Soft, positive BS, indwelling foley, left sided colostomy bag with dark loose stool. No frank blood. No pus.  Musculoskeletal: No Edema, chronic contractures  Neuro: baseline dementia, oriented to person only  Data Reviewed: Basic Metabolic Panel:  Recent Labs Lab 05/04/15 1600 05/05/15 0525  NA 143 136  K 4.2 3.5  CL 111 109  CO2 23 22  GLUCOSE 124* 121*  BUN 24* 18  CREATININE 0.92 0.83  CALCIUM 8.9 7.9*   Liver Function Tests:  Recent Labs Lab 05/04/15 1600 05/05/15 0525  AST 20 14*  ALT 6* 6*  ALKPHOS 53 46  BILITOT 0.6 0.2*  PROT 6.7 5.8*  ALBUMIN 3.0* 2.7*    Recent Labs Lab 05/04/15 1600  LIPASE 32   No results for input(s): AMMONIA in the last 168 hours. CBC:  Recent Labs Lab 05/04/15 1600 05/04/15 2325 05/05/15 0150 05/05/15 0525 05/05/15 0945  WBC 8.8 9.0 8.9 8.6 8.0  NEUTROABS 5.0  --   --   --   --   HGB 10.8* 10.7* 10.9* 10.5* 10.5*  HCT 35.0* 35.1*  35.9* 34.2* 34.6*  MCV 90.2 89.1 90.2 89.8 90.8  PLT 368 342 351 319 335   Cardiac Enzymes:   No results for input(s): CKTOTAL, CKMB, CKMBINDEX, TROPONINI in the last 168 hours. BNP (last 3 results) No results for input(s): BNP in the last 8760 hours.  ProBNP (last 3 results) No results for input(s): PROBNP in the last 8760 hours.  CBG: No results for input(s): GLUCAP in the last 168 hours.  Recent Results (from the past 240 hour(s))  Urine culture     Status: None (Preliminary result)   Collection Time: 05/04/15  3:30 PM  Result Value Ref Range Status   Specimen Description  URINE, CATHETERIZED  Final   Special Requests NONE  Final   Culture   Final    CULTURE REINCUBATED FOR BETTER GROWTH Performed at [facility]    Report Status PENDING  Incomplete  C difficile quick scan w PCR reflex     Status: Abnormal   Collection Time: 05/05/15  5:44 AM  Result Value Ref Range Status   C Diff antigen POSITIVE (A) NEGATIVE Final   C Diff toxin NEGATIVE NEGATIVE Final   C Diff interpretation   Final    C. difficile present, but toxin not detected. This indicates colonization. In most cases, this does not require treatment. If patient has signs and symptoms consistent with colitis, consider treatment.     Studies: No results found.  Scheduled Meds: . cefTRIAXone (ROCEPHIN)  IV  1 g Intravenous Q24H  . gabapentin  200 mg Oral TID  . [START ON 05/06/2015] lactobacillus  1 g Oral TID WC  . pantoprazole (PROTONIX) IV  40 mg Intravenous Q12H  . [START ON 05/06/2015] phenazopyridine  100 mg Oral TID WC  . QUEtiapine  25 mg Oral QHS  . vancomycin  125 mg Oral 4 times per day  . [DISCONTINUED] sodium chloride   Intravenous STAT    Continuous Infusions:     Time spent: 23mins  Denys Labree MD, PhD  [facility] Pager [phone]. If 7PM-7AM, please contact night-coverage at [email or web address], password [facility] 05/05/2015, 6:44 PM  LOS: 1 day

## 2015-05-06 DIAGNOSIS — I10 Essential (primary) hypertension: Secondary | ICD-10-CM

## 2015-05-06 LAB — BASIC METABOLIC PANEL
ANION GAP: 7 (ref 5–15)
BUN: 17 mg/dL (ref 6–20)
CALCIUM: 8.3 mg/dL — AB (ref 8.9–10.3)
CO2: 21 mmol/L — AB (ref 22–32)
Chloride: 113 mmol/L — ABNORMAL HIGH (ref 101–111)
Creatinine, Ser: 0.84 mg/dL (ref 0.44–1.00)
Glucose, Bld: 124 mg/dL — ABNORMAL HIGH (ref 65–99)
POTASSIUM: 3.9 mmol/L (ref 3.5–5.1)
Sodium: 141 mmol/L (ref 135–145)

## 2015-05-06 LAB — CBC
HEMATOCRIT: 33.4 % — AB (ref 36.0–46.0)
Hemoglobin: 10.3 g/dL — ABNORMAL LOW (ref 12.0–15.0)
MCH: 28.2 pg (ref 26.0–34.0)
MCHC: 30.8 g/dL (ref 30.0–36.0)
MCV: 91.5 fL (ref 78.0–100.0)
PLATELETS: 336 10*3/uL (ref 150–400)
RBC: 3.65 MIL/uL — ABNORMAL LOW (ref 3.87–5.11)
RDW: 16 % — AB (ref 11.5–15.5)
WBC: 8.2 10*3/uL (ref 4.0–10.5)

## 2015-05-06 LAB — MAGNESIUM: MAGNESIUM: 1.5 mg/dL — AB (ref 1.7–2.4)

## 2015-05-06 MED ORDER — MAGNESIUM SULFATE 2 GM/50ML IV SOLN
2.0000 g | Freq: Once | INTRAVENOUS | Status: AC
  Administered 2015-05-06: 2 g via INTRAVENOUS
  Filled 2015-05-06: qty 50

## 2015-05-06 MED ORDER — METOPROLOL TARTRATE 12.5 MG HALF TABLET
12.5000 mg | ORAL_TABLET | Freq: Two times a day (BID) | ORAL | Status: DC
  Administered 2015-05-06 – 2015-05-08 (×5): 12.5 mg via ORAL
  Filled 2015-05-06 (×6): qty 1

## 2015-05-06 NOTE — Progress Notes (Signed)
PROGRESS NOTE  [name] NWG:[id number] DOB: Feb 01, 1930 DOA: 05/04/2015 PCP: [name], MD  HPI/Recap of past 24 hours:  bladder spasm and right sided ab pain has resolved,  stool still loose.pleasantly demented  Assessment/Plan: Active Problems:   Paroxysmal atrial fibrillation   Hypertension   H/O: CVA (cerebrovascular accident)- with R Hemiparesis/contractures   UTI (urinary tract infection) due to urinary indwelling catheter   GI bleed   Urinary tract infectious disease  Recurrently UTI with indwelling foley,  does has symptom with bladder spasm and superpubic tenderness. No fever, no leukocytosis.  Urine culture pending, on rocephin, symptom resolved, f/u on urine culture result  Right sided ab pain, loose stool for a month, c diff antigen + , toxin negative, daughter reported patient did not have loose stool until a months ago, stool occult blood positive, will treat for cdiff with oral vanc. Ct ab no significant acute findings, ? left sided pyelo, patient denies left sided pain. Ct ab+ cholelithiasis, lft wnl. -persistent loose/watery stool in colostomy bag. Continue oral vanc  Stool occult blood positive: from cdiff colitis? H/h stable   Diet controlled diabetes: close monitor blood sugar.  H/o CVA, bedbound, total care at baseline  H/o dementia, baseline oriented to person, pleasant.  Code Status: DNR  Family Communication: patient   Disposition Plan: home when medically stable, likely in 1-2 days   Consultants:  none  Procedures:  none  Antibiotics:  Rocephin  Oral vanc   Objective: BP 138/39 mmHg  Pulse 55  Temp(Src) 98.5 F (36.9 C) (Oral)  Resp 18  Ht 5\' 3"  (1.6 m)  Wt 69.446 kg (153 lb 1.6 oz)  BMI 27.13 kg/m2  SpO2 98%  Intake/Output Summary (Last 24 hours) at 05/06/15 1855 Last data filed at 05/06/15 1827  Gross per 24 hour  Intake    840 ml  Output    650 ml  Net    190 ml   Filed Weights   05/04/15 2100  Weight:  69.446 kg (153 lb 1.6 oz)    Exam:   General:  NAD, pleasantly demented  Cardiovascular: RRR  Respiratory: CTABL  Abdomen: suprapubic tenderness resolved,  Mild right sided ab tenderness resolved,  no guarding , no rebound, Soft, positive BS, indwelling foley, left sided colostomy bag with  loose stool,.color less dark,  No frank blood. No pus.  Musculoskeletal: No Edema, chronic contractures  Neuro: baseline dementia, oriented to person only  Data Reviewed: Basic Metabolic Panel:  Recent Labs Lab 05/04/15 1600 05/05/15 0525 05/06/15 0440  NA 143 136 141  K 4.2 3.5 3.9  CL 111 109 113*  CO2 23 22 21*  GLUCOSE 124* 121* 124*  BUN 24* 18 17  CREATININE 0.92 0.83 0.84  CALCIUM 8.9 7.9* 8.3*  MG  --   --  1.5*   Liver Function Tests:  Recent Labs Lab 05/04/15 1600 05/05/15 0525  AST 20 14*  ALT 6* 6*  ALKPHOS 53 46  BILITOT 0.6 0.2*  PROT 6.7 5.8*  ALBUMIN 3.0* 2.7*    Recent Labs Lab 05/04/15 1600  LIPASE 32   No results for input(s): AMMONIA in the last 168 hours. CBC:  Recent Labs Lab 05/04/15 1600 05/04/15 2325 05/05/15 0150 05/05/15 0525 05/05/15 0945 05/06/15 0440  WBC 8.8 9.0 8.9 8.6 8.0 8.2  NEUTROABS 5.0  --   --   --   --   --   HGB 10.8* 10.7* 10.9* 10.5* 10.5* 10.3*  HCT 35.0* 35.1*  35.9* 34.2* 34.6* 33.4*  MCV 90.2 89.1 90.2 89.8 90.8 91.5  PLT 368 342 351 319 335 336   Cardiac Enzymes:   No results for input(s): CKTOTAL, CKMB, CKMBINDEX, TROPONINI in the last 168 hours. BNP (last 3 results) No results for input(s): BNP in the last 8760 hours.  ProBNP (last 3 results) No results for input(s): PROBNP in the last 8760 hours.  CBG: No results for input(s): GLUCAP in the last 168 hours.  Recent Results (from the past 240 hour(s))  Urine culture     Status: None (Preliminary result)   Collection Time: 05/04/15  3:30 PM  Result Value Ref Range Status   Specimen Description URINE, CATHETERIZED  Final   Special Requests NONE   Final   Culture   Final    >=100,000 COLONIES/mL GRAM NEGATIVE RODS >=100,000 COLONIES/mL ENTEROCOCCUS SPECIES Performed at [facility]    Report Status PENDING  Incomplete  C difficile quick scan w PCR reflex     Status: Abnormal   Collection Time: 05/05/15  5:44 AM  Result Value Ref Range Status   C Diff antigen POSITIVE (A) NEGATIVE Final   C Diff toxin NEGATIVE NEGATIVE Final   C Diff interpretation   Final    C. difficile present, but toxin not detected. This indicates colonization. In most cases, this does not require treatment. If patient has signs and symptoms consistent with colitis, consider treatment.     Studies: No results found.  Scheduled Meds: . cefTRIAXone (ROCEPHIN)  IV  1 g Intravenous Q24H  . gabapentin  200 mg Oral TID  . lactobacillus  1 g Oral TID WC  . metoprolol tartrate  12.5 mg Oral BID  . pantoprazole (PROTONIX) IV  40 mg Intravenous Q12H  . phenazopyridine  100 mg Oral TID WC  . QUEtiapine  25 mg Oral QHS  . vancomycin  125 mg Oral 4 times per day    Continuous Infusions:     Time spent: 14mins  Alaijah Gibler MD, PhD  [facility] Pager [phone]. If 7PM-7AM, please contact night-coverage at [email or web address], password [facility] 05/06/2015, 6:55 PM  LOS: 2 days

## 2015-05-06 NOTE — Care Management Note (Signed)
Case Management Note  Patient Details  Name: LELIA JONS MRN: [id number] Date of Birth: [birth year]  Subjective/Objective:  81 yo admitted with GI bleed                  Action/Plan: From home  Expected Discharge Date:                  Expected Discharge Plan:  [place]  In-House Referral:     Discharge planning Services  CM Consult  Post Acute Care Choice:    Choice offered to:     DME Arranged:    DME Agency:     HH Arranged:    [place]:  [place]  Status of Service:  In process, will continue to follow  Medicare Important Message Given:  Yes-second notification given Date Medicare IM Given:    Medicare IM give by:    Date Additional Medicare IM Given:    Additional Medicare Important Message give by:     If discussed at [place] of Stay Meetings, dates discussed:    Additional Comments: Pt receives HHRN/Aide services from [facility]. Will need resumption orders at DC. Lynnell Catalan, RN 05/06/2015, 1:36 PM

## 2015-05-06 NOTE — Care Management Important Message (Signed)
Important Message  Patient Details  Name: [name] MRN: [id number] Date of Birth: [birth year]   Medicare Important Message Given:  Yes-second notification given    Camillo Flaming 05/06/2015, 11:56 AMImportant Message  Patient Details  Name: [name] MRN: [id number] Date of Birth: 05/10/1930   Medicare Important Message Given:  Yes-second notification given    Camillo Flaming 05/06/2015, 11:56 AM

## 2015-05-07 DIAGNOSIS — T8351XD Infection and inflammatory reaction due to indwelling urinary catheter, subsequent encounter: Secondary | ICD-10-CM

## 2015-05-07 DIAGNOSIS — K921 Melena: Secondary | ICD-10-CM

## 2015-05-07 DIAGNOSIS — N179 Acute kidney failure, unspecified: Secondary | ICD-10-CM

## 2015-05-07 DIAGNOSIS — A0472 Enterocolitis due to Clostridium difficile, not specified as recurrent: Secondary | ICD-10-CM

## 2015-05-07 LAB — BASIC METABOLIC PANEL
Anion gap: 8 (ref 5–15)
BUN: 18 mg/dL (ref 6–20)
CO2: 24 mmol/L (ref 22–32)
CREATININE: 1.22 mg/dL — AB (ref 0.44–1.00)
Calcium: 8.4 mg/dL — ABNORMAL LOW (ref 8.9–10.3)
Chloride: 108 mmol/L (ref 101–111)
GFR calc Af Amer: 46 mL/min — ABNORMAL LOW (ref >60)
GFR calc non Af Amer: 40 mL/min — ABNORMAL LOW (ref >60)
Glucose, Bld: 126 mg/dL — ABNORMAL HIGH (ref 65–99)
Potassium: 4.4 mmol/L (ref 3.5–5.1)
Sodium: 140 mmol/L (ref 135–145)

## 2015-05-07 LAB — MAGNESIUM: MAGNESIUM: 2.3 mg/dL (ref 1.7–2.4)

## 2015-05-07 MED ORDER — FOSFOMYCIN TROMETHAMINE 3 G PO PACK
3.0000 g | PACK | Freq: Once | ORAL | Status: AC
  Administered 2015-05-07: 3 g via ORAL
  Filled 2015-05-07: qty 3

## 2015-05-07 MED ORDER — FAMOTIDINE 20 MG PO TABS
20.0000 mg | ORAL_TABLET | Freq: Once | ORAL | Status: AC
  Administered 2015-05-07: 20 mg via ORAL
  Filled 2015-05-07: qty 1

## 2015-05-07 MED ORDER — LEVOFLOXACIN 500 MG PO TABS
500.0000 mg | ORAL_TABLET | Freq: Every day | ORAL | Status: DC
  Administered 2015-05-07 – 2015-05-08 (×2): 500 mg via ORAL
  Filled 2015-05-07 (×2): qty 1

## 2015-05-07 MED ORDER — SODIUM CHLORIDE 0.9 % IV BOLUS (SEPSIS)
500.0000 mL | Freq: Once | INTRAVENOUS | Status: AC
  Administered 2015-05-07: 500 mL via INTRAVENOUS

## 2015-05-07 NOTE — Progress Notes (Signed)
This CM called [facility] and confirmed that pt is active with them and receiving HHRN. She is not receiving Hospice services. Marney Doctor RN,BSN,NCM [phone]

## 2015-05-07 NOTE — Progress Notes (Addendum)
PROGRESS NOTE  [name] PYP:[id number] DOB: [birth year] DOA: 05/04/2015 PCP: [name], MD   HPI: [name] is a 81 y.o. female with history of CVA with right-sided hemiparesis, CA colon status post colostomy bag placement, chronic indwelling catheter, chronic anemia, history of A. Fib presently on no anticoagulation was brought to the ER after patient was brought to the ER after patient was complaining of lower abdominal pain. She was found to have an UTI and was started on antibiotics.   Subjective / 24 H Interval events - no chest pain, shortness of breath, nausea or vomiting.  - abdominal pain is improving  Assessment/Plan: Active Problems:   Paroxysmal atrial fibrillation   Hypertension   H/O: CVA (cerebrovascular accident)- with R Hemiparesis/contractures   UTI (urinary tract infection) due to urinary indwelling catheter   GI bleed   Urinary tract infectious disease   AKI (acute kidney injury)   Enteritis due to Clostridium difficile    Recurrently UTI with indwelling foley,  - when she gets a UTI she is usually experiencing suprapubic tenderness, and this is improving today with antibiotics - she is growing Enterococcus sensitive to Ampicillin as well as pseudomonas.  - last time she was hospitalized it was felt like her pseudomonas is a contaminant as she was improving on antibiotics which did not cover this - she is improving now as well, I have discussed with ID, it does appear that her pseudomonas is a contaminant, for no will continue Ceftriaxone  Possible C diff infection vs colonization - results suggesting colonization, however she has been having loose stools for a month - after starting po Vancomycin, her diarrhea has resolved - given that she needs Antibiotics for #1, agree with need for po Vancomycin which she will need to continue for 7 more days after antibiotics are finished for #1  Stool occult blood positive  - likely from from cdiff  colitis - H/h stable   AKI - in the setting of poor po intake and diarrhea, her Cr bumped up to 1.22 today - now her diarrhea is improving - bolus 500 cc NS - repeat renal function in am   Diet controlled diabetes - monitor blood sugar, CBG within acceptable range today   H/o CVA - bedbound, total care at baseline  H/o dementia - baseline oriented to person, pleasant   Diet: Diet heart healthy/carb modified Room service appropriate?: Yes; Fluid consistency:: Thin Fluids: NS DVT Prophylaxis: SCDs  Code Status: DNR Family Communication: no family bedside  Disposition Plan: home when ready   Consultants:  None   Procedures:  None    Antibiotics Ceftriaxone 8/7 >> Po Vancomycin 8/8 >>   Studies  No results found.  Objective  Filed Vitals:   05/06/15 1016 05/06/15 1310 05/06/15 2114 05/07/15 0505  BP: 167/43 138/39 174/49 129/51  Pulse:  55 65 50  Temp:  98.5 F (36.9 C) 98.6 F (37 C) 97.8 F (36.6 C)  TempSrc:  Oral  Oral  Resp:  18 18 20   Height:      Weight:      SpO2:  98% 98% 98%    Intake/Output Summary (Last 24 hours) at 05/07/15 1409 Last data filed at 05/07/15 0505  Gross per 24 hour  Intake    240 ml  Output   1700 ml  Net  -1460 ml   Filed Weights   05/04/15 2100  Weight: 69.446 kg (153 lb 1.6 oz)   Exam:  GENERAL: NAD  HEENT: Head is normocephalic and atraumatic. No scleral icterus  LUNGS: Clear to auscultation. No wheezing or crackles  HEART: Regular rate and rhythm without murmur. 2+ pulses, no JVD, no peripheral edema  ABDOMEN: Soft, nontender, and nondistended. Positive bowel sounds. No hepatosplenomegaly was noted.  NEUROLOGIC: Cranial nerves II through XII are grossly intact. Weak on right. Contracture right hand  SKIN: No ulceration or induration present.  Data Reviewed: Basic Metabolic Panel:  Recent Labs Lab 05/04/15 1600 05/05/15 0525 05/06/15 0440 05/07/15 0412  NA 143 136 141 140  K 4.2 3.5 3.9 4.4    CL 111 109 113* 108  CO2 23 22 21* 24  GLUCOSE 124* 121* 124* 126*  BUN 24* 18 17 18   CREATININE 0.92 0.83 0.84 1.22*  CALCIUM 8.9 7.9* 8.3* 8.4*  MG  --   --  1.5* 2.3   Liver Function Tests:  Recent Labs Lab 05/04/15 1600 05/05/15 0525  AST 20 14*  ALT 6* 6*  ALKPHOS 53 46  BILITOT 0.6 0.2*  PROT 6.7 5.8*  ALBUMIN 3.0* 2.7*    Recent Labs Lab 05/04/15 1600  LIPASE 32   CBC:  Recent Labs Lab 05/04/15 1600 05/04/15 2325 05/05/15 0150 05/05/15 0525 05/05/15 0945 05/06/15 0440  WBC 8.8 9.0 8.9 8.6 8.0 8.2  NEUTROABS 5.0  --   --   --   --   --   HGB 10.8* 10.7* 10.9* 10.5* 10.5* 10.3*  HCT 35.0* 35.1* 35.9* 34.2* 34.6* 33.4*  MCV 90.2 89.1 90.2 89.8 90.8 91.5  PLT 368 342 351 319 335 336    Recent Results (from the past 240 hour(s))  Urine culture     Status: None (Preliminary result)   Collection Time: 05/04/15  3:30 PM  Result Value Ref Range Status   Specimen Description URINE, CATHETERIZED  Final   Special Requests NONE  Final   Culture   Final    >=100,000 COLONIES/mL PSEUDOMONAS AERUGINOSA >=100,000 COLONIES/mL ENTEROCOCCUS SPECIES Performed at [facility]    Report Status PENDING  Incomplete   Organism ID, Bacteria ENTEROCOCCUS SPECIES  Final      Susceptibility   Enterococcus species - MIC*    AMPICILLIN <=2 SENSITIVE Sensitive     LEVOFLOXACIN 1 SENSITIVE Sensitive     NITROFURANTOIN <=16 SENSITIVE Sensitive     VANCOMYCIN 1 SENSITIVE Sensitive     LINEZOLID 2 SENSITIVE Sensitive     * >=100,000 COLONIES/mL ENTEROCOCCUS SPECIES  C difficile quick scan w PCR reflex     Status: Abnormal   Collection Time: 05/05/15  5:44 AM  Result Value Ref Range Status   C Diff antigen POSITIVE (A) NEGATIVE Final   C Diff toxin NEGATIVE NEGATIVE Final   C Diff interpretation   Final    C. difficile present, but toxin not detected. This indicates colonization. In most cases, this does not require treatment. If patient has signs and symptoms  consistent with colitis, consider treatment.    Scheduled Meds: . cefTRIAXone (ROCEPHIN)  IV  1 g Intravenous Q24H  . fosfomycin  3 g Oral Once  . gabapentin  200 mg Oral TID  . lactobacillus  1 g Oral TID WC  . metoprolol tartrate  12.5 mg Oral BID  . pantoprazole (PROTONIX) IV  40 mg Intravenous Q12H  . QUEtiapine  25 mg Oral QHS  . vancomycin  125 mg Oral 4 times per day   Continuous Infusions:   Marzetta Board, MD [facility] Pager [phone]. If 7  PM - 7 AM, please contact night-coverage at [email or web address], password [facility] 05/07/2015, 2:09 PM  LOS: 3 days

## 2015-05-08 DIAGNOSIS — N179 Acute kidney failure, unspecified: Secondary | ICD-10-CM

## 2015-05-08 LAB — BASIC METABOLIC PANEL
Anion gap: 6 (ref 5–15)
BUN: 23 mg/dL — ABNORMAL HIGH (ref 6–20)
CALCIUM: 8.9 mg/dL (ref 8.9–10.3)
CO2: 25 mmol/L (ref 22–32)
Chloride: 107 mmol/L (ref 101–111)
Creatinine, Ser: 1.2 mg/dL — ABNORMAL HIGH (ref 0.44–1.00)
GFR calc Af Amer: 47 mL/min — ABNORMAL LOW (ref >60)
GFR calc non Af Amer: 41 mL/min — ABNORMAL LOW (ref >60)
GLUCOSE: 134 mg/dL — AB (ref 65–99)
POTASSIUM: 4.8 mmol/L (ref 3.5–5.1)
SODIUM: 138 mmol/L (ref 135–145)

## 2015-05-08 LAB — URINE CULTURE

## 2015-05-08 MED ORDER — VANCOMYCIN 50 MG/ML ORAL SOLUTION: 125.0000 mg | Freq: Four times a day (QID) | ORAL | Status: DC

## 2015-05-08 MED ORDER — FLORANEX PO PACK: 1.0000 g | PACK | Freq: Three times a day (TID) | ORAL | Status: DC

## 2015-05-08 MED ORDER — LEVOFLOXACIN 500 MG PO TABS: 500.0000 mg | ORAL_TABLET | Freq: Every day | ORAL | Status: DC

## 2015-05-08 MED ORDER — OXYCODONE HCL 5 MG PO TABS: 5.0000 mg | ORAL_TABLET | Freq: Four times a day (QID) | ORAL | Status: DC | PRN

## 2015-05-08 MED ORDER — PANTOPRAZOLE SODIUM 40 MG PO TBEC
40.0000 mg | DELAYED_RELEASE_TABLET | Freq: Two times a day (BID) | ORAL | Status: DC
  Administered 2015-05-08: 40 mg via ORAL
  Filled 2015-05-08: qty 1

## 2015-05-08 NOTE — Progress Notes (Signed)
Pt for discharge to home with pt daughter.   CSW received notification from RN that pt needing ambulance transport upon discharge.  CSW contacted pt daughter and confirmed address. Pt daughter request 11:30 am pick up from hospital and reports that she will be at home for pt arrival.   CSW arranged ambulance transport for pick up at 11:30 am.   No further social work needs identified at this time.  CSW signing off.   Alison Murray, MSW, [place] Work [phone]

## 2015-05-08 NOTE — Discharge Summary (Signed)
Physician Discharge Summary  [name]:[id number] DOB: 09/13/1930 DOA: 05/04/2015  PCP: [name], MD  Admit date: 05/04/2015 Discharge date: 05/08/2015  Time spent: > 35 minutes  Recommendations for Outpatient Follow-up:  1. Follow up with Dr. Fredderick Phenix in 1-2 weeks 2. Continue Levofloxacin for 5 additional days 3. Continue oral Vancomycin for 14 days    Discharge Diagnoses:  Active Problems:   Paroxysmal atrial fibrillation   Hypertension   H/O: CVA (cerebrovascular accident)- with R Hemiparesis/contractures   UTI (urinary tract infection) due to urinary indwelling catheter   GI bleed   Urinary tract infectious disease   AKI (acute kidney injury)   Enteritis due to Clostridium difficile  Discharge Condition: stable  Diet recommendation: regular  Filed Weights   05/04/15 2100  Weight: 69.446 kg (153 lb 1.6 oz)   History of present illness:  [name] is a 81 y.o. female with history of CVA with right-sided hemiparesis, CA colon status post colostomy bag placement, chronic indwelling catheter, chronic anemia, history of A. Fib presently on no anticoagulation was brought to the ER after patient was brought to the ER after patient was complaining of lower abdominal pain. Patient is complaining of suprapubic pain and right lower quadrant pain. Patient did not have any nausea vomiting but patient's daughter noted that patient was having increasing dark colostomy output over the last couple of days. Stool for blood was positive. UA was showing features concerning for UTI. CT abdomen and pelvis did not show anything acute except for abdominal aneurysm. Patient has been admitted for further management of UTI and possible GI bleed. Patient is on aspirin and endoscopy done in 2011 showed peptic ulcer disease and colonoscopy done in 2014 showed hemorrhoids. Patient is otherwise hemodynamically stable. Patient's abdominal pain improved with pain relief medications and presently is  asymptomatic.  Hospital Course:  Recurrently UTI with indwelling foley- when she gets a UTI she is usually experiencing suprapubic tenderness. She was started on empiric Ceftriaxone with subsequent clinical improvement. Urine cultures grew Enterococcus sensitive to Ampicillin as well as pseudomonas. Last time she was hospitalized it was felt like her pseudomonas is a contaminant as she was improving on antibiotics which did not cover this. During this hospitalization she is improving as well on Ceftriaxone, I have discussed with ID, it does appear that her pseudomonas is a contaminant and does not need to necessarily treat, and she will complete 5 additional days on Levofloxacin for her Enterococcus per sensitivities. Best to minimize antibiotics exposure in the setting of C diff Possible C diff infection vs colonization - results suggesting colonization, however she has been having loose stools for a month, and moreover, after starting po Vancomycin, her diarrhea has resolved. Given that she needs Antibiotics for #1, will plan for 14 additional days of po Vancomycin.  Stool occult blood positive - likely from from cdiff colitis, H/h stable  AKI - in the setting of poor po intake and diarrhea, now her diarrhea has resolved, she has good po intake and Cr is improving on discharge.  Diet controlled diabetes H/o CVA - bedbound, total care at baseline, [place] H/o dementia - baseline oriented to person, pleasant  Procedures:  None   Consultations:  ID (phone)  Discharge Exam: Filed Vitals:   05/07/15 0505 05/07/15 1420 05/07/15 2240 05/08/15 0602  BP: 129/51 131/52 182/46 156/52  Pulse: 50 62 58 50  Temp: 97.8 F (36.6 C) 97.6 F (36.4 C) 97.8 F (36.6 C) 97.7 F (36.5  C)  TempSrc: Oral Oral Oral Oral  Resp: 20 20 18 16   Height:      Weight:      SpO2: 98% 98% 98% 97%   General: NAD Cardiovascular: RRR Respiratory: CTA biL  Discharge Instructions    Medication List    STOP  taking these medications        famotidine 20 MG tablet  Commonly known as:  PEPCID     saccharomyces boulardii 250 MG capsule  Commonly known as:  FLORASTOR      TAKE these medications        ALEVE 220 MG Caps  Generic drug:  Naproxen Sodium  Take 220 mg by mouth every 12 (twelve) hours as needed (pain).     aspirin 81 MG EC tablet  Take 1 tablet (81 mg total) by mouth daily.     gabapentin 100 MG capsule  Commonly known as:  NEURONTIN  Take 2 capsules (200 mg total) by mouth 3 (three) times daily.     lactobacillus Pack  Take 1 packet (1 g total) by mouth 3 (three) times daily with meals.     levofloxacin 500 MG tablet  Commonly known as:  LEVAQUIN  Take 1 tablet (500 mg total) by mouth daily.     nitroGLYCERIN 0.4 MG SL tablet  Commonly known as:  NITROSTAT  Place 0.4 mg under the tongue every 5 (five) minutes as needed for chest pain.     oxyCODONE 5 MG immediate release tablet  Commonly known as:  Oxy IR/ROXICODONE  Take 1 tablet (5 mg total) by mouth every 6 (six) hours as needed for moderate pain or severe pain.     prochlorperazine 10 MG tablet  Commonly known as:  COMPAZINE  Take 10 mg by mouth every 8 (eight) hours as needed for nausea or vomiting.     QUEtiapine 25 MG tablet  Commonly known as:  SEROQUEL  Take 25 mg by mouth at bedtime.     traMADol 50 MG tablet  Commonly known as:  ULTRAM  Take 1 tablet (50 mg total) by mouth every 6 (six) hours as needed. pain     vancomycin 50 mg/mL oral solution  Commonly known as:  VANCOCIN  Take 2.5 mLs (125 mg total) by mouth every 6 (six) hours.           Follow-up Information    Follow up with [name], MD. Schedule an appointment as soon as possible for a visit in 1 week.   Specialty:  Family Medicine   Contact information:   [address] [place] [address] [phone]       The results of significant diagnostics from this hospitalization (including imaging, microbiology,  ancillary and laboratory) are listed below for reference.    Significant Diagnostic Studies: Ct Abdomen Pelvis W Contrast  05/04/2015   CLINICAL DATA:  RIGHT-side abdominal pain, recent pyelonephritis, progressive RIGHT lower quadrant pain in last 3 days, past history COPD, CHF, hypertension, stroke, atrial fibrillation, diabetes mellitus, colon cancer, cirrhosis  EXAM: CT ABDOMEN AND PELVIS WITH CONTRAST  TECHNIQUE: Multidetector CT imaging of the abdomen and pelvis was performed using the standard protocol following bolus administration of intravenous contrast. Sagittal and coronal MPR images reconstructed from axial data set.  CONTRAST:  189mL OMNIPAQUE IOHEXOL 300 MG/ML SOLN IV. No oral contrast administered.  COMPARISON:  03/31/2015  FINDINGS: Chronic atelectasis RIGHT lower lobe.  Stable low-attenuation lesion RIGHT lobe liver 9 x 6 mm image  23.  Additional fat attenuation lesion within liver 18 x 18 mm image 28 unchanged.  Dependent gallstones in gallbladder.  Cortical thinning of both kidneys.  Focus of nephrographic impairment at the posterior aspect of the mid LEFT kidney is unchanged.  Calcifications in both kidneys predominantly vascular without hydronephrosis or renal mass.  Scattered splenic calcifications without mass, could be due to old granulomatous disease or areas of prior hemorrhage/trauma.  Liver, spleen, pancreas, kidneys, and adrenal glands otherwise normal.  Extensive atherosclerotic calcifications including coronary arteries and numerous abdominal branch vessels.  Aneurysmal dilatation of mid abdominal aorta, 3.2 x 2.6 cm image 32.  Intraluminal thrombus at distal descending thoracic aorta.  Foley catheter decompresses urinary bladder.  Uterus surgically absent with nonvisualization of ovaries.  Normal appendix.  No ureteral calcification or dilatation.  Stranding of presacral fat planes unchanged.  Mid transverse colostomy with long Hartmann pouch to the proximal sigmoid colon.  Stomach  and small bowel loops unremarkable.  No mass, adenopathy, free air or free fluid.  Diffuse osseous demineralization.  IMPRESSION: Extensive atherosclerotic calcification with mild aneurysmal dilatation of the mid abdominal aorta up to 3.2 x 2.6 cm.  This includes extensive calcifications at the origins of the celiac artery, SMA, and renal arteries.  Mid transverse colostomy.  Chronic zone of nephrographic impairment at the mid LEFT kidney, unchanged from previous exam, could be related to pyelonephritis.  Cholelithiasis.  No acute intra-abdominal or intrapelvic abnormalities otherwise identified.   Electronically Signed   By: Lavonia Dana M.D.   On: 05/04/2015 17:30    Microbiology: Recent Results (from the past 240 hour(s))  Urine culture     Status: None   Collection Time: 05/04/15  3:30 PM  Result Value Ref Range Status   Specimen Description URINE, CATHETERIZED  Final   Special Requests NONE  Final   Culture   Final    >=100,000 COLONIES/mL PSEUDOMONAS AERUGINOSA >=100,000 COLONIES/mL ENTEROCOCCUS SPECIES Performed at [facility]    Report Status 05/08/2015 FINAL  Final   Organism ID, Bacteria ENTEROCOCCUS SPECIES  Final   Organism ID, Bacteria PSEUDOMONAS AERUGINOSA  Final      Susceptibility   Pseudomonas aeruginosa - MIC*    CEFTAZIDIME 4 SENSITIVE Sensitive     CIPROFLOXACIN >=4 RESISTANT Resistant     GENTAMICIN <=1 SENSITIVE Sensitive     IMIPENEM >=16 RESISTANT Resistant     PIP/TAZO 16 SENSITIVE Sensitive     CEFEPIME 2 SENSITIVE Sensitive     * >=100,000 COLONIES/mL PSEUDOMONAS AERUGINOSA   Enterococcus species - MIC*    AMPICILLIN <=2 SENSITIVE Sensitive     LEVOFLOXACIN 1 SENSITIVE Sensitive     NITROFURANTOIN <=16 SENSITIVE Sensitive     VANCOMYCIN 1 SENSITIVE Sensitive     LINEZOLID 2 SENSITIVE Sensitive     * >=100,000 COLONIES/mL ENTEROCOCCUS SPECIES  C difficile quick scan w PCR reflex     Status: Abnormal   Collection Time: 05/05/15  5:44 AM  Result  Value Ref Range Status   C Diff antigen POSITIVE (A) NEGATIVE Final   C Diff toxin NEGATIVE NEGATIVE Final   C Diff interpretation   Final    C. difficile present, but toxin not detected. This indicates colonization. In most cases, this does not require treatment. If patient has signs and symptoms consistent with colitis, consider treatment.    Labs: Basic Metabolic Panel:  Recent Labs Lab 05/04/15 1600 05/05/15 0525 05/06/15 0440 05/07/15 0412 05/08/15 0430  NA 143 136 141 140 138  K 4.2 3.5 3.9 4.4 4.8  CL 111 109 113* 108 107  CO2 23 22 21* 24 25  GLUCOSE 124* 121* 124* 126* 134*  BUN 24* 18 17 18  23*  CREATININE 0.92 0.83 0.84 1.22* 1.20*  CALCIUM 8.9 7.9* 8.3* 8.4* 8.9  MG  --   --  1.5* 2.3  --    Liver Function Tests:  Recent Labs Lab 05/04/15 1600 05/05/15 0525  AST 20 14*  ALT 6* 6*  ALKPHOS 53 46  BILITOT 0.6 0.2*  PROT 6.7 5.8*  ALBUMIN 3.0* 2.7*    Recent Labs Lab 05/04/15 1600  LIPASE 32   CBC:  Recent Labs Lab 05/04/15 1600 05/04/15 2325 05/05/15 0150 05/05/15 0525 05/05/15 0945 05/06/15 0440  WBC 8.8 9.0 8.9 8.6 8.0 8.2  NEUTROABS 5.0  --   --   --   --   --   HGB 10.8* 10.7* 10.9* 10.5* 10.5* 10.3*  HCT 35.0* 35.1* 35.9* 34.2* 34.6* 33.4*  MCV 90.2 89.1 90.2 89.8 90.8 91.5  PLT 368 342 351 319 335 336   Signed:  Edan Juday  [facility] 05/08/2015, 12:51 PM

## 2015-05-08 NOTE — Progress Notes (Signed)
The patient is receiving Protonix by the intravenous route.  Based on criteria approved by the Pharmacy and [facility], the medication is being converted to the equivalent oral dose form.  These criteria include: -No Active GI bleeding -Able to tolerate diet of full liquids (or better) or tube feeding -Able to tolerate other medications by the oral or enteral route  If you have any questions about this conversion, please contact the Pharmacy Department (phone [phone]).  Thank you.  Minda Ditto PharmD Pager [phone] 05/08/2015, 10:05 AM

## 2015-05-08 NOTE — Progress Notes (Signed)
Order for HHRN/face to face, demographic sheet and last progress note faxed to [facility]. Marney Doctor RN,BSN,NCM [phone]

## 2015-05-08 NOTE — Discharge Instructions (Signed)
Follow with [name], MD in 5-7 days  Please get a complete blood count and chemistry panel checked by your Primary MD at your next visit, and again as instructed by your Primary MD. Please get your medications reviewed and adjusted by your Primary MD.  Please request your Primary MD to go over all Hospital Tests and Procedure/Radiological results at the follow up, please get all Hospital records sent to your Prim MD by signing hospital release before you go home.  If you had Pneumonia of Lung problems at the Hospital: Please get a 2 view Chest X ray done in 6-8 weeks after hospital discharge or sooner if instructed by your Primary MD.  If you have Congestive Heart Failure: Please call your Cardiologist or Primary MD anytime you have any of the following symptoms:  1) 3 pound weight gain in 24 hours or 5 pounds in 1 week  2) shortness of breath, with or without a dry hacking cough  3) swelling in the hands, feet or stomach  4) if you have to sleep on extra pillows at night in order to breathe  Follow cardiac low salt diet and 1.5 lit/day fluid restriction.  If you have diabetes Accuchecks 4 times/day, Once in AM empty stomach and then before each meal. Log in all results and show them to your primary doctor at your next visit. If any glucose reading is under 80 or above 300 call your primary MD immediately.  If you have Seizure/Convulsions/Epilepsy: Please do not drive, operate heavy machinery, participate in activities at heights or participate in high speed sports until you have seen by Primary MD or a Neurologist and advised to do so again.  If you had Gastrointestinal Bleeding: Please ask your Primary MD to check a complete blood count within one week of discharge or at your next visit. Your endoscopic/colonoscopic biopsies that are pending at the time of discharge, will also need to followed by your Primary MD.  Get Medicines reviewed and adjusted. Please take all your  medications with you for your next visit with your Primary MD  Please request your Primary MD to go over all hospital tests and procedure/radiological results at the follow up, please ask your Primary MD to get all Hospital records sent to his/her office.  If you experience worsening of your admission symptoms, develop shortness of breath, life threatening emergency, suicidal or homicidal thoughts you must seek medical attention immediately by calling 911 or calling your MD immediately  if symptoms less severe.  You must read complete instructions/literature along with all the possible adverse reactions/side effects for all the Medicines you take and that have been prescribed to you. Take any new Medicines after you have completely understood and accpet all the possible adverse reactions/side effects.   Do not drive or operate heavy machinery when taking Pain medications.   Do not take more than prescribed Pain, Sleep and Anxiety Medications  Special Instructions: If you have smoked or chewed Tobacco  in the last 2 yrs please stop smoking, stop any regular Alcohol  and or any Recreational drug use.  Wear Seat belts while driving.  Please note You were cared for by a hospitalist during your hospital stay. If you have any questions about your discharge medications or the care you received while you were in the hospital after you are discharged, you can call the unit and asked to speak with the hospitalist on call if the hospitalist that took care of you is not available. Once  you are discharged, your primary care physician will handle any further medical issues. Please note that NO REFILLS for any discharge medications will be authorized once you are discharged, as it is imperative that you return to your primary care physician (or establish a relationship with a primary care physician if you do not have one) for your aftercare needs so that they can reassess your need for medications and monitor your  lab values.  You can reach the hospitalist office at phone [phone] or fax [phone]   If you do not have a primary care physician, you can call [phone] for a physician referral.  Activity: As tolerated with Full fall precautions use walker/cane & assistance as needed  Diet: regular  Disposition Home

## 2015-09-19 ENCOUNTER — Inpatient Hospital Stay (HOSPITAL_COMMUNITY)
Admission: EM | Admit: 2015-09-19 | Discharge: 2015-09-23 | DRG: 871 | Disposition: A | Discharge status: Hospice | Payer: Medicare Other | Attending: Internal Medicine | Admitting: Internal Medicine

## 2015-09-19 ENCOUNTER — Observation Stay (HOSPITAL_COMMUNITY): Payer: Medicare Other

## 2015-09-19 ENCOUNTER — Encounter (HOSPITAL_COMMUNITY): Payer: Self-pay | Admitting: Emergency Medicine

## 2015-09-19 DIAGNOSIS — Z66 Do not resuscitate: Secondary | ICD-10-CM

## 2015-09-19 DIAGNOSIS — Z1624 Resistance to multiple antibiotics: Secondary | ICD-10-CM | POA: Diagnosis present

## 2015-09-19 DIAGNOSIS — K7200 Acute and subacute hepatic failure without coma: Secondary | ICD-10-CM | POA: Diagnosis present

## 2015-09-19 DIAGNOSIS — Z8249 Family history of ischemic heart disease and other diseases of the circulatory system: Secondary | ICD-10-CM

## 2015-09-19 DIAGNOSIS — N179 Acute kidney failure, unspecified: Secondary | ICD-10-CM | POA: Diagnosis not present

## 2015-09-19 DIAGNOSIS — N19 Unspecified kidney failure: Secondary | ICD-10-CM

## 2015-09-19 DIAGNOSIS — Z7982 Long term (current) use of aspirin: Secondary | ICD-10-CM

## 2015-09-19 DIAGNOSIS — C189 Malignant neoplasm of colon, unspecified: Secondary | ICD-10-CM | POA: Diagnosis not present

## 2015-09-19 DIAGNOSIS — E875 Hyperkalemia: Secondary | ICD-10-CM | POA: Diagnosis not present

## 2015-09-19 DIAGNOSIS — D649 Anemia, unspecified: Secondary | ICD-10-CM | POA: Diagnosis present

## 2015-09-19 DIAGNOSIS — A4152 Sepsis due to Pseudomonas: Secondary | ICD-10-CM | POA: Diagnosis not present

## 2015-09-19 DIAGNOSIS — N390 Urinary tract infection, site not specified: Secondary | ICD-10-CM | POA: Diagnosis present

## 2015-09-19 DIAGNOSIS — R0600 Dyspnea, unspecified: Secondary | ICD-10-CM

## 2015-09-19 DIAGNOSIS — I10 Essential (primary) hypertension: Secondary | ICD-10-CM | POA: Diagnosis present

## 2015-09-19 DIAGNOSIS — Z9049 Acquired absence of other specified parts of digestive tract: Secondary | ICD-10-CM

## 2015-09-19 DIAGNOSIS — Z7401 Bed confinement status: Secondary | ICD-10-CM

## 2015-09-19 DIAGNOSIS — R109 Unspecified abdominal pain: Secondary | ICD-10-CM | POA: Diagnosis present

## 2015-09-19 DIAGNOSIS — G9340 Encephalopathy, unspecified: Secondary | ICD-10-CM | POA: Diagnosis not present

## 2015-09-19 DIAGNOSIS — I69351 Hemiplegia and hemiparesis following cerebral infarction affecting right dominant side: Secondary | ICD-10-CM

## 2015-09-19 DIAGNOSIS — R740 Nonspecific elevation of levels of transaminase and lactic acid dehydrogenase [LDH]: Secondary | ICD-10-CM | POA: Diagnosis present

## 2015-09-19 DIAGNOSIS — F17210 Nicotine dependence, cigarettes, uncomplicated: Secondary | ICD-10-CM | POA: Diagnosis present

## 2015-09-19 DIAGNOSIS — R410 Disorientation, unspecified: Secondary | ICD-10-CM | POA: Diagnosis not present

## 2015-09-19 DIAGNOSIS — Z7189 Other specified counseling: Secondary | ICD-10-CM | POA: Diagnosis present

## 2015-09-19 DIAGNOSIS — Z8673 Personal history of transient ischemic attack (TIA), and cerebral infarction without residual deficits: Secondary | ICD-10-CM

## 2015-09-19 DIAGNOSIS — Z86010 Personal history of colonic polyps: Secondary | ICD-10-CM

## 2015-09-19 DIAGNOSIS — T83518A Infection and inflammatory reaction due to other urinary catheter, initial encounter: Secondary | ICD-10-CM | POA: Diagnosis present

## 2015-09-19 DIAGNOSIS — J440 Chronic obstructive pulmonary disease with acute lower respiratory infection: Secondary | ICD-10-CM | POA: Diagnosis present

## 2015-09-19 DIAGNOSIS — I480 Paroxysmal atrial fibrillation: Secondary | ICD-10-CM | POA: Diagnosis present

## 2015-09-19 DIAGNOSIS — E119 Type 2 diabetes mellitus without complications: Secondary | ICD-10-CM | POA: Diagnosis present

## 2015-09-19 DIAGNOSIS — R6521 Severe sepsis with septic shock: Secondary | ICD-10-CM | POA: Diagnosis present

## 2015-09-19 DIAGNOSIS — Z515 Encounter for palliative care: Secondary | ICD-10-CM | POA: Diagnosis present

## 2015-09-19 DIAGNOSIS — E872 Acidosis, unspecified: Secondary | ICD-10-CM | POA: Diagnosis not present

## 2015-09-19 DIAGNOSIS — Z888 Allergy status to other drugs, medicaments and biological substances status: Secondary | ICD-10-CM

## 2015-09-19 DIAGNOSIS — A419 Sepsis, unspecified organism: Secondary | ICD-10-CM

## 2015-09-19 DIAGNOSIS — M549 Dorsalgia, unspecified: Secondary | ICD-10-CM | POA: Diagnosis present

## 2015-09-19 DIAGNOSIS — J189 Pneumonia, unspecified organism: Secondary | ICD-10-CM | POA: Diagnosis present

## 2015-09-19 DIAGNOSIS — Y846 Urinary catheterization as the cause of abnormal reaction of the patient, or of later complication, without mention of misadventure at the time of the procedure: Secondary | ICD-10-CM | POA: Diagnosis present

## 2015-09-19 DIAGNOSIS — Z79899 Other long term (current) drug therapy: Secondary | ICD-10-CM

## 2015-09-19 DIAGNOSIS — K7460 Unspecified cirrhosis of liver: Secondary | ICD-10-CM | POA: Diagnosis present

## 2015-09-19 DIAGNOSIS — N3289 Other specified disorders of bladder: Secondary | ICD-10-CM | POA: Diagnosis present

## 2015-09-19 DIAGNOSIS — I5032 Chronic diastolic (congestive) heart failure: Secondary | ICD-10-CM | POA: Diagnosis present

## 2015-09-19 DIAGNOSIS — D75839 Thrombocytosis, unspecified: Secondary | ICD-10-CM | POA: Diagnosis present

## 2015-09-19 DIAGNOSIS — Z90710 Acquired absence of both cervix and uterus: Secondary | ICD-10-CM

## 2015-09-19 DIAGNOSIS — Z933 Colostomy status: Secondary | ICD-10-CM

## 2015-09-19 DIAGNOSIS — G8929 Other chronic pain: Secondary | ICD-10-CM | POA: Diagnosis present

## 2015-09-19 DIAGNOSIS — B9620 Unspecified Escherichia coli [E. coli] as the cause of diseases classified elsewhere: Secondary | ICD-10-CM | POA: Diagnosis present

## 2015-09-19 DIAGNOSIS — F0390 Unspecified dementia without behavioral disturbance: Secondary | ICD-10-CM | POA: Diagnosis present

## 2015-09-19 DIAGNOSIS — Z87440 Personal history of urinary (tract) infections: Secondary | ICD-10-CM

## 2015-09-19 DIAGNOSIS — Z801 Family history of malignant neoplasm of trachea, bronchus and lung: Secondary | ICD-10-CM

## 2015-09-19 DIAGNOSIS — D473 Essential (hemorrhagic) thrombocythemia: Secondary | ICD-10-CM | POA: Diagnosis present

## 2015-09-19 LAB — CBC WITH DIFFERENTIAL/PLATELET
BASOS ABS: 0 10*3/uL (ref 0.0–0.1)
BASOS PCT: 0 %
Basophils Absolute: 0 10*3/uL (ref 0.0–0.1)
Basophils Relative: 0 %
EOS ABS: 0.2 10*3/uL (ref 0.0–0.7)
EOS ABS: 0.1 10*3/uL (ref 0.0–0.7)
EOS PCT: 2 %
Eosinophils Relative: 1 %
HCT: 34.3 % — ABNORMAL LOW (ref 36.0–46.0)
HCT: 34.8 % — ABNORMAL LOW (ref 36.0–46.0)
HEMOGLOBIN: 10.6 g/dL — AB (ref 12.0–15.0)
Hemoglobin: 10.3 g/dL — ABNORMAL LOW (ref 12.0–15.0)
LYMPHS ABS: 1.5 10*3/uL (ref 0.7–4.0)
Lymphocytes Relative: 9 %
Lymphocytes Relative: 13 %
Lymphs Abs: 1.3 10*3/uL (ref 0.7–4.0)
MCH: 26.9 pg (ref 26.0–34.0)
MCH: 26.8 pg (ref 26.0–34.0)
MCHC: 30 g/dL (ref 30.0–36.0)
MCHC: 30.5 g/dL (ref 30.0–36.0)
MCV: 89.6 fL (ref 78.0–100.0)
MCV: 88.1 fL (ref 78.0–100.0)
MONO ABS: 0.7 10*3/uL (ref 0.1–1.0)
MONOS PCT: 6 %
Monocytes Absolute: 0.9 10*3/uL (ref 0.1–1.0)
Monocytes Relative: 6 %
NEUTROS PCT: 80 %
Neutro Abs: 11.5 10*3/uL — ABNORMAL HIGH (ref 1.7–7.7)
Neutro Abs: 9.3 10*3/uL — ABNORMAL HIGH (ref 1.7–7.7)
Neutrophils Relative %: 83 %
PLATELETS: 463 10*3/uL — AB (ref 150–400)
Platelets: 326 10*3/uL (ref 150–400)
RBC: 3.83 MIL/uL — AB (ref 3.87–5.11)
RBC: 3.95 MIL/uL (ref 3.87–5.11)
RDW: 16.4 % — ABNORMAL HIGH (ref 11.5–15.5)
RDW: 15.9 % — AB (ref 11.5–15.5)
WBC: 14 10*3/uL — AB (ref 4.0–10.5)
WBC: 11.7 10*3/uL — ABNORMAL HIGH (ref 4.0–10.5)

## 2015-09-19 LAB — URINALYSIS, ROUTINE W REFLEX MICROSCOPIC
Bilirubin Urine: NEGATIVE
GLUCOSE, UA: NEGATIVE mg/dL
Ketones, ur: NEGATIVE mg/dL
Nitrite: POSITIVE — AB
Specific Gravity, Urine: 1.02 (ref 1.005–1.030)
pH: 5.5 (ref 5.0–8.0)

## 2015-09-19 LAB — LACTIC ACID, PLASMA
LACTIC ACID, VENOUS: 1.5 mmol/L (ref 0.5–2.0)
LACTIC ACID, VENOUS: 1.8 mmol/L (ref 0.5–2.0)

## 2015-09-19 LAB — I-STAT CG4 LACTIC ACID, ED: LACTIC ACID, VENOUS: 2.5 mmol/L — AB (ref 0.5–2.0)

## 2015-09-19 LAB — PROTIME-INR
INR: 1.34 (ref 0.00–1.49)
Prothrombin Time: 16.7 seconds — ABNORMAL HIGH (ref 11.6–15.2)

## 2015-09-19 LAB — URINE MICROSCOPIC-ADD ON

## 2015-09-19 LAB — COMPREHENSIVE METABOLIC PANEL
ALT: 10 U/L — AB (ref 14–54)
AST: 20 U/L (ref 15–41)
Albumin: 2.7 g/dL — ABNORMAL LOW (ref 3.5–5.0)
Alkaline Phosphatase: 69 U/L (ref 38–126)
Anion gap: 8 (ref 5–15)
BILIRUBIN TOTAL: 1.1 mg/dL (ref 0.3–1.2)
BUN: 26 mg/dL — AB (ref 6–20)
CALCIUM: 8.7 mg/dL — AB (ref 8.9–10.3)
CO2: 25 mmol/L (ref 22–32)
CREATININE: 1.2 mg/dL — AB (ref 0.44–1.00)
Chloride: 108 mmol/L (ref 101–111)
GFR calc Af Amer: 47 mL/min — ABNORMAL LOW (ref >60)
GFR, EST NON AFRICAN AMERICAN: 41 mL/min — AB (ref >60)
Glucose, Bld: 170 mg/dL — ABNORMAL HIGH (ref 65–99)
Potassium: 4.2 mmol/L (ref 3.5–5.1)
Sodium: 141 mmol/L (ref 135–145)
TOTAL PROTEIN: 7.5 g/dL (ref 6.5–8.1)

## 2015-09-19 LAB — APTT: APTT: 35 s (ref 24–37)

## 2015-09-19 LAB — PROCALCITONIN: Procalcitonin: 0.11 ng/mL

## 2015-09-19 MED ORDER — ENSURE ENLIVE PO LIQD
237.0000 mL | Freq: Two times a day (BID) | ORAL | Status: DC
  Administered 2015-09-19 – 2015-09-21 (×5): 237 mL via ORAL

## 2015-09-19 MED ORDER — NORTRIPTYLINE HCL 25 MG PO CAPS
50.0000 mg | ORAL_CAPSULE | Freq: Every day | ORAL | Status: DC
  Administered 2015-09-19 – 2015-09-21 (×3): 50 mg via ORAL
  Filled 2015-09-19 (×4): qty 2

## 2015-09-19 MED ORDER — LORAZEPAM 2 MG/ML IJ SOLN
1.0000 mg | Freq: Four times a day (QID) | INTRAMUSCULAR | Status: DC | PRN
  Administered 2015-09-19 – 2015-09-22 (×3): 1 mg via INTRAVENOUS
  Filled 2015-09-19 (×4): qty 1

## 2015-09-19 MED ORDER — IPRATROPIUM-ALBUTEROL 0.5-2.5 (3) MG/3ML IN SOLN
3.0000 mL | RESPIRATORY_TRACT | Status: DC | PRN
  Administered 2015-09-19 – 2015-09-21 (×2): 3 mL via RESPIRATORY_TRACT
  Filled 2015-09-19 (×2): qty 3

## 2015-09-19 MED ORDER — QUETIAPINE FUMARATE 25 MG PO TABS
25.0000 mg | ORAL_TABLET | Freq: Every day | ORAL | Status: DC
  Administered 2015-09-19 – 2015-09-21 (×3): 25 mg via ORAL
  Filled 2015-09-19 (×4): qty 1

## 2015-09-19 MED ORDER — ASPIRIN EC 81 MG PO TBEC
81.0000 mg | DELAYED_RELEASE_TABLET | Freq: Every day | ORAL | Status: DC
  Administered 2015-09-20 – 2015-09-21 (×2): 81 mg via ORAL
  Filled 2015-09-19 (×4): qty 1

## 2015-09-19 MED ORDER — NITROGLYCERIN 0.4 MG SL SUBL: 0.4000 mg | SUBLINGUAL_TABLET | SUBLINGUAL | Status: DC | PRN

## 2015-09-19 MED ORDER — SODIUM CHLORIDE 0.9 % IV BOLUS (SEPSIS)
1000.0000 mL | Freq: Once | INTRAVENOUS | Status: AC
  Administered 2015-09-19: 1000 mL via INTRAVENOUS

## 2015-09-19 MED ORDER — FLORANEX PO PACK
1.0000 g | PACK | Freq: Three times a day (TID) | ORAL | Status: DC
  Administered 2015-09-20 – 2015-09-22 (×6): 1 g via ORAL
  Filled 2015-09-19 (×11): qty 1

## 2015-09-19 MED ORDER — DEXTROSE 5 % IV SOLN
1.0000 g | INTRAVENOUS | Status: DC
  Administered 2015-09-19 – 2015-09-21 (×3): 1 g via INTRAVENOUS
  Filled 2015-09-19 (×4): qty 1

## 2015-09-19 MED ORDER — CEFTRIAXONE SODIUM 1 G IJ SOLR: 1.0000 g | INTRAMUSCULAR | Status: DC

## 2015-09-19 MED ORDER — GABAPENTIN 400 MG PO CAPS
400.0000 mg | ORAL_CAPSULE | Freq: Three times a day (TID) | ORAL | Status: DC
  Administered 2015-09-19 – 2015-09-20 (×2): 400 mg via ORAL
  Filled 2015-09-19 (×5): qty 1

## 2015-09-19 MED ORDER — OXYCODONE HCL 5 MG PO TABS: 5.0000 mg | ORAL_TABLET | Freq: Four times a day (QID) | ORAL | Status: DC | PRN

## 2015-09-19 MED ORDER — ENOXAPARIN SODIUM 40 MG/0.4ML ~~LOC~~ SOLN
40.0000 mg | SUBCUTANEOUS | Status: DC
  Administered 2015-09-19 – 2015-09-20 (×2): 40 mg via SUBCUTANEOUS
  Filled 2015-09-19 (×3): qty 0.4

## 2015-09-19 MED ORDER — METHYLPREDNISOLONE SODIUM SUCC 40 MG IJ SOLR
40.0000 mg | Freq: Two times a day (BID) | INTRAMUSCULAR | Status: DC
Start: 2015-09-19 — End: 2015-09-20
  Administered 2015-09-19 – 2015-09-20 (×2): 40 mg via INTRAVENOUS
  Filled 2015-09-19 (×3): qty 1

## 2015-09-19 MED ORDER — VANCOMYCIN 50 MG/ML ORAL SOLUTION
125.0000 mg | Freq: Four times a day (QID) | ORAL | Status: DC
  Administered 2015-09-19 – 2015-09-22 (×7): 125 mg via ORAL
  Filled 2015-09-19 (×15): qty 2.5

## 2015-09-19 MED ORDER — SODIUM CHLORIDE 0.9 % IV SOLN
INTRAVENOUS | Status: DC
  Administered 2015-09-19: 16:00:00 via INTRAVENOUS

## 2015-09-19 MED ORDER — PROCHLORPERAZINE MALEATE 10 MG PO TABS: 10.0000 mg | ORAL_TABLET | Freq: Three times a day (TID) | ORAL | Status: DC | PRN

## 2015-09-19 MED ORDER — GENTAMICIN SULFATE 40 MG/ML IJ SOLN
300.0000 mg | Freq: Once | INTRAVENOUS | Status: DC
  Filled 2015-09-19: qty 7.5

## 2015-09-19 NOTE — ED Notes (Signed)
Pt's [place] daughter, Primitivo Gauze [phone], is POA and can be given information in regards to pt.  Pt's daughter, [name] age 81, is not allowed to see pt, visit or be given any information about pt. But will try to gain access to pt by stating her name is Primitivo Gauze.

## 2015-09-19 NOTE — ED Provider Notes (Signed)
CSN: [id number]     Arrival date & time 09/19/15  1000 History   First MD Initiated Contact with Patient 09/19/15 1008     Chief Complaint  Patient presents with  . Urinary Tract Infection     (Consider location/radiation/quality/duration/timing/severity/associated sxs/prior Treatment) HPI Patient has dementia, level V caveat. Patient is presenting from home. We discussed the patient's presentation with her primary care physician prior to patient's arrival. Patient has been receiving care for urinary tract infection, has an indwelling Foley catheter. The catheter became dislodged overnight. In addition, the patient's urine culture just resulted, showing resistant strain of Pseudomonas, sensitive to gentamicin. The patient herself denies pain, is otherwise incapable of providing helpful aspects of the history of present illness.  Past Medical History  Diagnosis Date  . COPD (chronic obstructive pulmonary disease) ([place])   . CHF (congestive heart failure) ([place])     ECHO 2011  EF: 60% to [id number], grd 1 distolic dysfxn, [id number] ECHO, tech limited by Afib  . Hypertension   . Stroke [facility]) 2008    rt side deficit  . A-fib ([place])   . Back pain   . Constipation   . SBO (small bowel obstruction) ([place])     per GI note in 2011, pt denies  . Upper GI bleed 2011    required hospitalization  . Colon polyps   . Tobacco abuse   . Diabetes mellitus     per daughter doctor took patient off DM medication 6 months ago   . Cancer ([place])   . Colon cancer ([place])   . Cirrhosis of liver [facility])    Past Surgical History  Procedure Laterality Date  . Total abdominal hysterectomy  1980  . Colonoscopy N/A 09/04/2013    Procedure: COLONOSCOPY;  Surgeon: Lear Ng, MD;  Location: [facility] ENDOSCOPY;  Service: Endoscopy;  Laterality: N/A;  . Colon resection N/A 09/06/2013    Procedure: PARTIAL COLECTOMY WITH COLOSTOMY;  Surgeon: Earnstine Regal, MD;  Location: [place];  Service: General;  Laterality: N/A;   . Colostomy revision N/A 09/08/2013    Procedure: COLOSTOMY REVISION;  Surgeon: Gwenyth Ober, MD;  Location: [place];  Service: General;  Laterality: N/A;  . Colostomy     Family History  Problem Relation Age of Onset  . Lung cancer Mother   . Heart disease Father    Social History  Substance Use Topics  . Smoking status: Current Some Day Smoker -- 0.50 packs/day for 15 years    Types: Cigarettes  . Smokeless tobacco: Never Used  . Alcohol Use: No   OB History    No data available     Review of Systems  Unable to perform ROS: Dementia      Allergies  Aggrenox  Home Medications   Prior to Admission medications   Medication Sig Start Date End Date Taking? Authorizing Provider  aspirin EC 81 MG EC tablet Take 1 tablet (81 mg total) by mouth daily. 01/25/15   Silver Huguenin Elgergawy, MD  gabapentin (NEURONTIN) 100 MG capsule Take 2 capsules (200 mg total) by mouth 3 (three) times daily. 01/25/15   Silver Huguenin Elgergawy, MD  lactobacillus (FLORANEX/LACTINEX) PACK Take 1 packet (1 g total) by mouth 3 (three) times daily with meals. 05/08/15   Costin Karlyne Greenspan, MD  levofloxacin (LEVAQUIN) 500 MG tablet Take 1 tablet (500 mg total) by mouth daily. 05/08/15   Costin Karlyne Greenspan, MD  Naproxen Sodium (ALEVE) 220 MG CAPS Take 220 mg by  mouth every 12 (twelve) hours as needed (pain).    Historical Provider, MD  nitroGLYCERIN (NITROSTAT) 0.4 MG SL tablet Place 0.4 mg under the tongue every 5 (five) minutes as needed for chest pain.    Historical Provider, MD  oxyCODONE (OXY IR/ROXICODONE) 5 MG immediate release tablet Take 1 tablet (5 mg total) by mouth every 6 (six) hours as needed for moderate pain or severe pain. 05/08/15   Costin Karlyne Greenspan, MD  prochlorperazine (COMPAZINE) 10 MG tablet Take 10 mg by mouth every 8 (eight) hours as needed for nausea or vomiting.    Historical Provider, MD  QUEtiapine (SEROQUEL) 25 MG tablet Take 25 mg by mouth at bedtime.  01/03/15   Historical Provider, MD  traMADol  (ULTRAM) 50 MG tablet Take 1 tablet (50 mg total) by mouth every 6 (six) hours as needed. pain 04/11/15   Janece Canterbury, MD  vancomycin (VANCOCIN) 50 mg/mL oral solution Take 2.5 mLs (125 mg total) by mouth every 6 (six) hours. 05/08/15   Costin Karlyne Greenspan, MD   BP 115/85 mmHg  Pulse 137  Temp(Src) 99.2 F (37.3 C) (Oral)  Resp 18  SpO2 99% Physical Exam  Constitutional: She is oriented to person, place, and time.  Obese elderly female speaking nonsensically  HENT:  Head: Normocephalic and atraumatic.  Eyes: Conjunctivae and EOM are normal.  Cardiovascular: Regular rhythm.  Tachycardia present.   Pulmonary/Chest: Effort normal and breath sounds normal. No stridor. No respiratory distress.  Abdominal: She exhibits no distension. There is no tenderness.  Musculoskeletal: She exhibits no edema.  Neurological: She is alert and oriented to person, place, and time. No cranial nerve deficit.  Skin: Skin is warm and dry.  Psychiatric: She has a normal mood and affect.  Nursing note and vitals reviewed.   ED Course  Procedures (including critical care time) Labs Review Labs Reviewed  URINALYSIS, ROUTINE W REFLEX MICROSCOPIC (NOT AT [facility]) - Abnormal; Notable for the following:    Color, Urine AMBER (*)    APPearance TURBID (*)    Hgb urine dipstick LARGE (*)    Protein, ur >300 (*)    Nitrite POSITIVE (*)    Leukocytes, UA LARGE (*)    All other components within normal limits  COMPREHENSIVE METABOLIC PANEL - Abnormal; Notable for the following:    Glucose, Bld 170 (*)    BUN 26 (*)    Creatinine, Ser 1.20 (*)    Calcium 8.7 (*)    Albumin 2.7 (*)    ALT 10 (*)    GFR calc non Af Amer 41 (*)    GFR calc Af Amer 47 (*)    All other components within normal limits  CBC WITH DIFFERENTIAL/PLATELET - Abnormal; Notable for the following:    WBC 14.0 (*)    RBC 3.83 (*)    Hemoglobin 10.3 (*)    HCT 34.3 (*)    RDW 16.4 (*)    Platelets 463 (*)    Neutro Abs 11.5 (*)    All other  components within normal limits  URINE MICROSCOPIC-ADD ON - Abnormal; Notable for the following:    Squamous Epithelial / LPF 6-30 (*)    Bacteria, UA MANY (*)    Casts HYALINE CASTS (*)    All other components within normal limits  I-STAT CG4 LACTIC ACID, ED - Abnormal; Notable for the following:    Lactic Acid, Venous 2.50 (*)    All other components within normal limits    Imaging  Review No results found. I have personally reviewed and evaluated these images and lab results as part of my medical decision-making.  Cardiac monitor 141 sinus tach abnormal Pulse ox 99% room air normal  The patient's physician also states that there are questions about the patient's home living situation, and requests, the patient's daughter and have limited access, but that the patient's granddaughter, the power of attorney, should be aware of the patient.  On repeat exam, the patient has tolerated placement of Foley catheter well.  Labs consistent with urinary tract infection per I reviewed the eczema report of the patient's recent urinalysis, with urine culture demonstrating susceptibility to gentamicin.   MDM  Elderly female with dementia, chronic Foley catheter now presents with displaced catheter, persistent confusion. Here, the patient has evidence for ongoing urinary tract infection, recent urinalysis and culture demonstrate susceptibility to gentamicin, no reactivity to oral antibiotics. Patient started on gentamicin, fluid resuscitation, admitted for further evaluation and management.  Carmin Muskrat, MD 09/19/15 1256

## 2015-09-19 NOTE — H&P (Signed)
[facility] History and Physical  [name] [id number] DOB: 1930/05/03 DOA: 09/19/2015  Referring physician: ED physician, Dr. Vanita Panda  PCP: [name], MD   Chief Complaint: confusion   HPI:  Patient is 81 year old female with known history of CVA with right-sided hemiparesis, colon cancer status post colostomy bag placement, chronic indwelling catheter and chronic A. fib on no anticoagulation due to high risk of fall. Patient was brought to emergency department apparently from home after primary care physician evaluated the patient and recommended her to go to emergency Department. Please note that in emergency department, patient is confused, unable to provide history, no family at bedside present. Per emergency room doctor report, patient recently had urinary tract infection that was positive for Pseudomonas and sensitive to gentamicin. She was given gentamicin dose in emergency department.  In emergency department, patient is hemodynamically stable, vital signs notable for heart rate in 130s, blood work notable for WBC 14, platelets 463, creatinine 1.2. TRH asked to admit for further evaluation.   Assessment and Plan:  Principal Problem:   Sepsis due to Pseudomonas UTI ([place]) - given one dose of gentamycin in ED - repeat urine culture requested, also started on Maxipime - sepsis order set in place - follow up on lactic acid, blood and urine culture   Active Problems:   Acute encephalopathy - secondary to the above - monitor clinical response to ABX    UTI (urinary tract infection) due to urinary indwelling catheter ([place]) - continue Maxipime for now and follow up on urine culture     AKI (acute kidney injury) ([place]) - placed on IVF, repeat BMP in AM    Thrombocytosis (HCC) - reactive, repeat CBC in AM    Paroxysmal atrial fibrillation (HCC) - rate controlled, CHADS2 4-5, not on AC due to high risk fall    Adenocarcinoma, colon ([place]) - outpatient  follow up     Dementia - confused at this time so need to monitor clinical response to abx     H/O: CVA (cerebrovascular accident)- with R Hemiparesis/contractures    DNR (do not resuscitate)  Lovenox SQ for DVT prophylaxis   Radiological Exams on Admission: No results found.  Code Status: DNR Family Communication: No family at bedside  Disposition Plan: Admit for further evaluation    [facility] [id number]   Review of Systems:  Unable to obtain due to confusion   Past Medical History  Diagnosis Date  . COPD (chronic obstructive pulmonary disease) ([place])   . CHF (congestive heart failure) ([place])     ECHO 2011  EF: 60% to 123456, grd 1 distolic dysfxn, [id number] ECHO, tech limited by Afib  . Hypertension   . Stroke [facility]) 2008    rt side deficit  . A-fib ([place])   . Back pain   . Constipation   . SBO (small bowel obstruction) ([place])     per GI note in 2011, pt denies  . Upper GI bleed 2011    required hospitalization  . Colon polyps   . Tobacco abuse   . Diabetes mellitus     per daughter doctor took patient off DM medication 6 months ago   . Cancer ([place])   . Colon cancer ([place])   . Cirrhosis of liver [facility])     Past Surgical History  Procedure Laterality Date  . Total abdominal hysterectomy  1980  . Colonoscopy N/A 09/04/2013    Procedure: COLONOSCOPY;  Surgeon: Lear Ng, MD;  Location: [facility]  ENDOSCOPY;  Service: Endoscopy;  Laterality: N/A;  . Colon resection N/A 09/06/2013    Procedure: PARTIAL COLECTOMY WITH COLOSTOMY;  Surgeon: Earnstine Regal, MD;  Location: [place];  Service: General;  Laterality: N/A;  . Colostomy revision N/A 09/08/2013    Procedure: COLOSTOMY REVISION;  Surgeon: Gwenyth Ober, MD;  Location: [place];  Service: General;  Laterality: N/A;  . Colostomy      Social History:  reports that she has been smoking Cigarettes.  She has a 7.5 pack-year smoking history. She has never used smokeless tobacco. She reports that she does not drink  alcohol or use illicit drugs.  Allergies  Allergen Reactions  . Aggrenox [Aspirin-Dipyridamole Er] Diarrhea and Nausea And Vomiting    & abdominal bleeding    Family History  Problem Relation Age of Onset  . Lung cancer Mother   . Heart disease Father     Medication Sig  aspirin EC 81 MG EC tablet Take 1 tablet (81 mg total) by mouth daily.  gabapentin (NEURONTIN) 100 MG capsule Take 2 capsules (200 mg total) by mouth 3 times daily.  gabapentin (NEURONTIN) 400 MG capsule Take 400 mg by mouth 3 (three) times daily.  nortriptyline (PAMELOR) 25 MG capsule Take 50 mg by mouth at bedtime.  oxyCODONE  5 MG immediate release tablet Take 1 tablet every 6 (six) hours as needed   prochlorperazine (COMPAZINE) 10 MG tablet Take 10 mg by mouth every 8 (eight) hours as needed   QUEtiapine (SEROQUEL) 25 MG tablet Take 25 mg by mouth at bedtime.   vancomycin 50 mg/mL oral solution Take 2.5 mLs (125 mg total) by mouth every 6 (six) hours.    Physical Exam: Filed Vitals:   09/19/15 1008 09/19/15 1200  BP: 115/85 109/83  Pulse: 137 142  Temp: 99.2 F (37.3 C)   TempSrc: Oral   Resp: 18 21  SpO2: 99% 98%    Physical Exam  Constitutional: Appears confused, NAD HENT: Normocephalic. External right and left ear normal. Dry MM Eyes: Conjunctivae and EOM are normal. PERRLA, no scleral icterus.  Neck: Normal ROM. Neck supple. No JVD. No tracheal deviation. No thyromegaly.  CVS: RRR, no gallops, no carotid bruit.  Pulmonary: Effort and breath sounds normal, no stridor, diminished breath sounds at bases  Abdominal: Soft. BS +,  no distension, tenderness, rebound or guarding.  Musculoskeletal: Normal range of motion.  Lymphadenopathy: No lymphadenopathy noted, cervical, inguinal. Neuro: Alert. Normal reflexes, muscle tone coordination.  Skin: Skin is warm and dry. No rash noted. Not diaphoretic. No erythema. No pallor.  Psychiatric: difficult to assess due to confusion  Labs on Admission:  Basic  Metabolic Panel:  Recent Labs Lab 09/19/15 1055  NA 141  K 4.2  CL 108  CO2 25  GLUCOSE 170*  BUN 26*  CREATININE 1.20*  CALCIUM 8.7*   Liver Function Tests:  Recent Labs Lab 09/19/15 1055  AST 20  ALT 10*  ALKPHOS 69  BILITOT 1.1  PROT 7.5  ALBUMIN 2.7*   CBC:  Recent Labs Lab 09/19/15 1055  WBC 14.0*  NEUTROABS 11.5*  HGB 10.3*  HCT 34.3*  MCV 89.6  PLT 463*   EKG: pending   If 7PM-7AM, please contact night-coverage [email or web address] Password [email or web address] 09/19/2015, 1:05 PM

## 2015-09-19 NOTE — ED Notes (Signed)
Bed: [id number] Expected date:  Expected time:  Means of arrival:  Comments: EMS- Fall, catheter removed, needs replaced

## 2015-09-19 NOTE — Progress Notes (Signed)
ANTIBIOTIC CONSULT NOTE - INITIAL  Pharmacy Consult for Cefepime Indication: UTI  Allergies  Allergen Reactions  . Aggrenox [Aspirin-Dipyridamole Er] Diarrhea and Nausea And Vomiting    & abdominal bleeding    Patient Measurements: Weight 69.4 kg and height 160 cm as of 04/2015, IBW 52.4 kg, ABW 59.2 kg  Vital Signs: Temp: 99.2 F (37.3 C) (12/23 1008) Temp Source: Oral (12/23 1008) BP: 109/83 mmHg (12/23 1200) Pulse Rate: 142 (12/23 1200) Intake/Output from previous day:   Intake/Output from this shift:    Labs:  Recent Labs  09/19/15 1055  WBC 14.0*  HGB 10.3*  PLT 463*  CREATININE 1.20*   CrCl cannot be calculated (Unknown ideal weight.). No results for input(s): VANCOTROUGH, VANCOPEAK, VANCORANDOM, GENTTROUGH, GENTPEAK, GENTRANDOM, TOBRATROUGH, TOBRAPEAK, TOBRARND, AMIKACINPEAK, AMIKACINTROU, AMIKACIN in the last 72 hours.   Microbiology: No results found for this or any previous visit (from the past 720 hour(s)).  Medical History: Past Medical History  Diagnosis Date  . COPD (chronic obstructive pulmonary disease) ([place])   . CHF (congestive heart failure) ([place])     ECHO 2011  EF: 60% to 123456, grd 1 distolic dysfxn, [id number] ECHO, tech limited by Afib  . Hypertension   . Stroke [facility]) 2008    rt side deficit  . A-fib ([place])   . Back pain   . Constipation   . SBO (small bowel obstruction) ([place])     per GI note in 2011, pt denies  . Upper GI bleed 2011    required hospitalization  . Colon polyps   . Tobacco abuse   . Diabetes mellitus     per daughter doctor took patient off DM medication 6 months ago   . Cancer ([place])   . Colon cancer ([place])   . Cirrhosis of liver (HCC)     Assessment: 34 yoF history of CVA with right-sided hemiparesis, CA colon status post colostomy bag placement, chronic indwelling catheter, chronic anemia, history of A. Fib presently on no anticoagulation was brought to the ER for foley catheter replacement. Per family, the foley  catheter clogged so family removed the catheter and cannot reinsert.  Per EDP note, patient has been receiving treatment PTA for UTI and urine culture growing Pseudomonas, sensitive to gentamicin and cefepime.  SCr elevated at 1.2 likely d/t urinary retention with foley catheter removed so to avoid further insult to injury will start cefepime over gent.     Today, 09/19/2015: - Temp 99.2  - WBC 14.0 - SCr 1.2, CrCl~38 ml/min.   Goal of Therapy:  Doses adjusted per renal function Eradication of infection  Plan:  1.  Cefepime 1g IV q24h. 2.  F/u SCr for further dose adjustments if needed.  Hershal Coria 09/19/2015,12:25 PM

## 2015-09-19 NOTE — Progress Notes (Signed)
Notified physician that patient BP 91/37 and HR 135.  Patient sleeping since administration of ativan IV.  Physician orders keep an eye on patient and if BP systolic less than 90, call physician and patient will be transferred to stepdown.

## 2015-09-19 NOTE — ED Notes (Signed)
Per EMS-states family removed foley cath yesterday because it was clogged-tried to reinsert but could not-currently on antibiotic Cipro for UTI-sent here for foley placement

## 2015-09-19 NOTE — Progress Notes (Signed)
   09/19/15 1300  Clinical Encounter Type  Visited With Patient  Visit Type Initial;Psychological support;Spiritual support;ED  Referral From Nurse  Consult/Referral To Chaplain  Spiritual Encounters  Spiritual Needs Emotional;Other (Comment) (Pastoral Conversation)  Stress Factors  Patient Stress Factors Other (Comment) (Not able to assess)   The Chaplain visited with the patient after learning about her through the nurse.  The patient was unable to speak coherently with the Chaplain. The patient had her heart rate monitor wrapped around another cord from her arm.  The Chaplain notified the nurse, who promptly fixed the issue.  The Chaplain will follow-up upon request.   Chaplain Shanon Ace M.Div.

## 2015-09-20 DIAGNOSIS — G8929 Other chronic pain: Secondary | ICD-10-CM | POA: Diagnosis present

## 2015-09-20 DIAGNOSIS — Z9049 Acquired absence of other specified parts of digestive tract: Secondary | ICD-10-CM | POA: Diagnosis not present

## 2015-09-20 DIAGNOSIS — Z7401 Bed confinement status: Secondary | ICD-10-CM | POA: Diagnosis not present

## 2015-09-20 DIAGNOSIS — Z933 Colostomy status: Secondary | ICD-10-CM | POA: Diagnosis not present

## 2015-09-20 DIAGNOSIS — E872 Acidosis: Secondary | ICD-10-CM | POA: Diagnosis present

## 2015-09-20 DIAGNOSIS — I10 Essential (primary) hypertension: Secondary | ICD-10-CM | POA: Diagnosis present

## 2015-09-20 DIAGNOSIS — K7460 Unspecified cirrhosis of liver: Secondary | ICD-10-CM | POA: Diagnosis present

## 2015-09-20 DIAGNOSIS — K7200 Acute and subacute hepatic failure without coma: Secondary | ICD-10-CM | POA: Diagnosis present

## 2015-09-20 DIAGNOSIS — Z79899 Other long term (current) drug therapy: Secondary | ICD-10-CM | POA: Diagnosis not present

## 2015-09-20 DIAGNOSIS — E119 Type 2 diabetes mellitus without complications: Secondary | ICD-10-CM | POA: Diagnosis present

## 2015-09-20 DIAGNOSIS — N390 Urinary tract infection, site not specified: Secondary | ICD-10-CM | POA: Diagnosis present

## 2015-09-20 DIAGNOSIS — J440 Chronic obstructive pulmonary disease with acute lower respiratory infection: Secondary | ICD-10-CM | POA: Diagnosis present

## 2015-09-20 DIAGNOSIS — N3289 Other specified disorders of bladder: Secondary | ICD-10-CM | POA: Diagnosis present

## 2015-09-20 DIAGNOSIS — Z515 Encounter for palliative care: Secondary | ICD-10-CM | POA: Diagnosis present

## 2015-09-20 DIAGNOSIS — Z888 Allergy status to other drugs, medicaments and biological substances status: Secondary | ICD-10-CM | POA: Diagnosis not present

## 2015-09-20 DIAGNOSIS — F17210 Nicotine dependence, cigarettes, uncomplicated: Secondary | ICD-10-CM | POA: Diagnosis present

## 2015-09-20 DIAGNOSIS — G9340 Encephalopathy, unspecified: Secondary | ICD-10-CM | POA: Diagnosis present

## 2015-09-20 DIAGNOSIS — B9620 Unspecified Escherichia coli [E. coli] as the cause of diseases classified elsewhere: Secondary | ICD-10-CM | POA: Diagnosis present

## 2015-09-20 DIAGNOSIS — D649 Anemia, unspecified: Secondary | ICD-10-CM | POA: Diagnosis present

## 2015-09-20 DIAGNOSIS — J189 Pneumonia, unspecified organism: Secondary | ICD-10-CM | POA: Diagnosis present

## 2015-09-20 DIAGNOSIS — Z7982 Long term (current) use of aspirin: Secondary | ICD-10-CM | POA: Diagnosis not present

## 2015-09-20 DIAGNOSIS — M549 Dorsalgia, unspecified: Secondary | ICD-10-CM | POA: Diagnosis present

## 2015-09-20 DIAGNOSIS — Z801 Family history of malignant neoplasm of trachea, bronchus and lung: Secondary | ICD-10-CM | POA: Diagnosis not present

## 2015-09-20 DIAGNOSIS — N179 Acute kidney failure, unspecified: Secondary | ICD-10-CM | POA: Diagnosis present

## 2015-09-20 DIAGNOSIS — Z1624 Resistance to multiple antibiotics: Secondary | ICD-10-CM | POA: Diagnosis present

## 2015-09-20 DIAGNOSIS — Z86010 Personal history of colonic polyps: Secondary | ICD-10-CM | POA: Diagnosis not present

## 2015-09-20 DIAGNOSIS — Z90710 Acquired absence of both cervix and uterus: Secondary | ICD-10-CM | POA: Diagnosis not present

## 2015-09-20 DIAGNOSIS — Z66 Do not resuscitate: Secondary | ICD-10-CM | POA: Diagnosis present

## 2015-09-20 DIAGNOSIS — I5032 Chronic diastolic (congestive) heart failure: Secondary | ICD-10-CM | POA: Diagnosis present

## 2015-09-20 DIAGNOSIS — F0390 Unspecified dementia without behavioral disturbance: Secondary | ICD-10-CM | POA: Diagnosis present

## 2015-09-20 DIAGNOSIS — R740 Nonspecific elevation of levels of transaminase and lactic acid dehydrogenase [LDH]: Secondary | ICD-10-CM | POA: Diagnosis present

## 2015-09-20 DIAGNOSIS — R410 Disorientation, unspecified: Secondary | ICD-10-CM | POA: Diagnosis present

## 2015-09-20 DIAGNOSIS — E875 Hyperkalemia: Secondary | ICD-10-CM | POA: Diagnosis present

## 2015-09-20 DIAGNOSIS — Z8249 Family history of ischemic heart disease and other diseases of the circulatory system: Secondary | ICD-10-CM | POA: Diagnosis not present

## 2015-09-20 DIAGNOSIS — C189 Malignant neoplasm of colon, unspecified: Secondary | ICD-10-CM | POA: Diagnosis present

## 2015-09-20 DIAGNOSIS — F0391 Unspecified dementia with behavioral disturbance: Secondary | ICD-10-CM

## 2015-09-20 DIAGNOSIS — D473 Essential (hemorrhagic) thrombocythemia: Secondary | ICD-10-CM | POA: Diagnosis present

## 2015-09-20 DIAGNOSIS — Y846 Urinary catheterization as the cause of abnormal reaction of the patient, or of later complication, without mention of misadventure at the time of the procedure: Secondary | ICD-10-CM | POA: Diagnosis present

## 2015-09-20 DIAGNOSIS — A4152 Sepsis due to Pseudomonas: Secondary | ICD-10-CM | POA: Diagnosis present

## 2015-09-20 DIAGNOSIS — R109 Unspecified abdominal pain: Secondary | ICD-10-CM | POA: Diagnosis present

## 2015-09-20 DIAGNOSIS — I69351 Hemiplegia and hemiparesis following cerebral infarction affecting right dominant side: Secondary | ICD-10-CM | POA: Diagnosis not present

## 2015-09-20 DIAGNOSIS — T83518A Infection and inflammatory reaction due to other urinary catheter, initial encounter: Secondary | ICD-10-CM | POA: Diagnosis present

## 2015-09-20 DIAGNOSIS — R6521 Severe sepsis with septic shock: Secondary | ICD-10-CM | POA: Diagnosis present

## 2015-09-20 DIAGNOSIS — Z87440 Personal history of urinary (tract) infections: Secondary | ICD-10-CM | POA: Diagnosis not present

## 2015-09-20 DIAGNOSIS — I480 Paroxysmal atrial fibrillation: Secondary | ICD-10-CM | POA: Diagnosis present

## 2015-09-20 LAB — BASIC METABOLIC PANEL
Anion gap: 9 (ref 5–15)
BUN: 28 mg/dL — AB (ref 6–20)
CALCIUM: 8.3 mg/dL — AB (ref 8.9–10.3)
CHLORIDE: 113 mmol/L — AB (ref 101–111)
CO2: 19 mmol/L — ABNORMAL LOW (ref 22–32)
CREATININE: 1.28 mg/dL — AB (ref 0.44–1.00)
GFR calc Af Amer: 44 mL/min — ABNORMAL LOW (ref >60)
GFR calc non Af Amer: 38 mL/min — ABNORMAL LOW (ref >60)
Glucose, Bld: 219 mg/dL — ABNORMAL HIGH (ref 65–99)
Potassium: 4.6 mmol/L (ref 3.5–5.1)
SODIUM: 141 mmol/L (ref 135–145)

## 2015-09-20 LAB — CBC
HCT: 34.3 % — ABNORMAL LOW (ref 36.0–46.0)
Hemoglobin: 10.4 g/dL — ABNORMAL LOW (ref 12.0–15.0)
MCH: 26.7 pg (ref 26.0–34.0)
MCHC: 30.3 g/dL (ref 30.0–36.0)
MCV: 87.9 fL (ref 78.0–100.0)
PLATELETS: 371 10*3/uL (ref 150–400)
RBC: 3.9 MIL/uL (ref 3.87–5.11)
RDW: 16.2 % — AB (ref 11.5–15.5)
WBC: 9.6 10*3/uL (ref 4.0–10.5)

## 2015-09-20 LAB — AMMONIA: Ammonia: 32 umol/L (ref 9–35)

## 2015-09-20 MED ORDER — METOPROLOL TARTRATE 1 MG/ML IV SOLN
5.0000 mg | Freq: Four times a day (QID) | INTRAVENOUS | Status: DC | PRN
  Administered 2015-09-20 – 2015-09-22 (×2): 5 mg via INTRAVENOUS
  Filled 2015-09-20 (×3): qty 5

## 2015-09-20 MED ORDER — PREDNISONE 50 MG PO TABS
50.0000 mg | ORAL_TABLET | Freq: Every day | ORAL | Status: DC
  Administered 2015-09-21: 50 mg via ORAL
  Filled 2015-09-20 (×2): qty 1

## 2015-09-20 MED ORDER — OXYBUTYNIN CHLORIDE ER 5 MG PO TB24
10.0000 mg | ORAL_TABLET | Freq: Every day | ORAL | Status: DC
  Administered 2015-09-20 – 2015-09-21 (×2): 10 mg via ORAL
  Filled 2015-09-20 (×3): qty 1

## 2015-09-20 MED ORDER — GABAPENTIN 100 MG PO CAPS
100.0000 mg | ORAL_CAPSULE | Freq: Two times a day (BID) | ORAL | Status: DC
  Administered 2015-09-20 – 2015-09-21 (×3): 100 mg via ORAL
  Filled 2015-09-20 (×6): qty 1

## 2015-09-20 NOTE — Progress Notes (Addendum)
Patient ID: [name], female   DOB: [birth year], 81 y.o.   MRN: [id number]  [facility] PROGRESS NOTE  [name] [id number] DOB: 1929-12-06 DOA: 10-11-15 PCP: [name], MD   Brief narrative:    Patient is 81 year old female with known history of CVA with right-sided hemiparesis, colon cancer status post colostomy bag placement, chronic indwelling catheter and chronic A. fib on no anticoagulation due to high risk of fall. Patient was brought to emergency department apparently from home after primary care physician evaluated the patient and recommended her to go to emergency Department. Please note that in emergency department, patient is confused, unable to provide history, no family at bedside present. Per emergency room doctor report, patient recently had urinary tract infection that was positive for Pseudomonas and sensitive to gentamicin. She was given gentamicin dose in emergency department.  In emergency department, patient is hemodynamically stable, vital signs notable for heart rate in 130s, blood work notable for WBC 14, platelets 463, creatinine 1.2. TRH asked to admit for further evaluation.   Assessment/Plan:    Principal Problem:  Sepsis due to Pseudomonas UTI and ? Left lobe PNA (HCC) - given one dose of gentamycin in ED - repeat urine culture requested, still pending  - continue Maxipime day #2 - sepsis order set in place, WBC trending down and WNL this AM - follow up on lactic acid, blood and urine culture   Active Problems:   Left lobe PNA - continue Maxipime, taper down steroids - continue with BD's as needed    Acute encephalopathy - secondary to the above, still rather confused  - monitor clinical response to ABX   UTI (urinary tract infection) due to urinary indwelling catheter ([place]) - continue Maxipime for now and follow up on urine culture    AKI (acute kidney injury) ([place]) - placed on IVF, repeat BMP in AM    Thrombocytosis (HCC) - reactive, resolved    Paroxysmal atrial fibrillation (HCC) - CHADS2 4-5, not on AC due to high risk fall - place on Metoprolol 25 mg PO QD and add Metoprolol inj 5 mg as needed  - monitor on telemetry    Adenocarcinoma, colon ([place]) - outpatient follow up    Dementia - confused at this time so need to monitor clinical response to abx     Recent C. Diff - still on oral vancomycin  - try to minimize ABS as possible    H/O: CVA (cerebrovascular accident)- with R Hemiparesis/contractures   DNR (do not resuscitate)  Lovenox SQ for DVT prophylaxis   Code Status: DNR Family Communication:  plan of care discussed with the patient Disposition Plan: Not ready for d/c yet  IV access:  Peripheral IV  Procedures and diagnostic studies:    Dg Chest Port 1 View 10-11-15 Left much worse than right airspace disease worrisome for pneumonia is or less likely asymmetric edema.   Medical Consultants:  None  Other Consultants:  PT SLP Nutritionist   IAnti-Infectives:   Maxipime 2023-10-11 -->  [name], MD  [facility] Pager [phone]  If 7PM-7AM, please contact night-coverage [email or web address] Password [email or web address] 09/20/2015, 2:20 PM   LOS: 0 days   HPI/Subjective: No events overnight.   Objective: Filed Vitals:   Oct 11, 2015 2027 10/11/2015 2123 09/20/15 0537 09/20/15 1344  BP: 136/76 126/91 105/41 131/91  Pulse: 106 54 131 150  Temp: 98 F (36.7 C) 98.1 F (36.7 C) 97.9 F (36.6 C) 98.9 F (37.2 C)  TempSrc: Oral Oral Axillary Oral  Resp: 16 16 16 21   SpO2:  93% 90% 95%    Intake/Output Summary (Last 24 hours) at 09/20/15 1420 Last data filed at 09/20/15 1225  Gross per 24 hour  Intake    480 ml  Output    100 ml  Net    380 ml    Exam:   General:  Pt is alert, confused, NAD  Cardiovascular: Irregular rhythm, tachycardic, no rubs, no gallops  Respiratory: Clear to auscultation bilaterally, no wheezing, rales at bases L > R  Abdomen:  Soft, non tender, non distended, bowel sounds present, no guarding  Data Reviewed: Basic Metabolic Panel:  Recent Labs Lab 09/19/15 1055 09/20/15 0522  NA 141 141  K 4.2 4.6  CL 108 113*  CO2 25 19*  GLUCOSE 170* 219*  BUN 26* 28*  CREATININE 1.20* 1.28*  CALCIUM 8.7* 8.3*   Liver Function Tests:  Recent Labs Lab 09/19/15 1055  AST 20  ALT 10*  ALKPHOS 69  BILITOT 1.1  PROT 7.5  ALBUMIN 2.7*   CBC:  Recent Labs Lab 09/19/15 1055 09/19/15 1525 09/20/15 0522  WBC 14.0* 11.7* 9.6  NEUTROABS 11.5* 9.3*  --   HGB 10.3* 10.6* 10.4*  HCT 34.3* 34.8* 34.3*  MCV 89.6 88.1 87.9  PLT 463* 326 371    Recent Results (from the past 240 hour(s))  Culture, blood (x 2)     Status: None (Preliminary result)   Collection Time: 09/19/15  3:20 PM  Result Value Ref Range Status   Specimen Description BLOOD LEFT ARM  Final   Special Requests IN PEDIATRIC BOTTLE [place]  Final   Culture   Final    NO GROWTH < 24 HOURS Performed at [facility]    Report Status PENDING  Incomplete  Culture, blood (x 2)     Status: None (Preliminary result)   Collection Time: 09/19/15  3:25 PM  Result Value Ref Range Status   Specimen Description BLOOD LEFT HAND  Final   Special Requests IN PEDIATRIC BOTTLE 3CC  Final   Culture   Final    NO GROWTH < 24 HOURS Performed at [facility]    Report Status PENDING  Incomplete     Scheduled Meds: . aspirin EC  81 mg Oral Daily  . ceFEPime (MAXIPIME) IV  1 g Intravenous Q24H  . enoxaparin (LOVENOX) injection  40 mg Subcutaneous Q24H  . feeding supplement (ENSURE ENLIVE)  237 mL Oral BID BM  . gabapentin  400 mg Oral TID  . lactobacillus  1 g Oral TID WC  . methylPREDNISolone (SOLU-MEDROL) injection  40 mg Intravenous BID  . nortriptyline  50 mg Oral QHS  . oxybutynin  10 mg Oral Daily  . QUEtiapine  25 mg Oral QHS  . vancomycin  125 mg Oral 4 times per day   Continuous Infusions: . sodium chloride 75 mL/hr at 09/19/15 1531

## 2015-09-20 NOTE — Evaluation (Addendum)
Physical Therapy Evaluation Patient Details Name: [name] MRN: [id number] DOB: 1930/05/29 Today's Date: 09/20/2015   History of Present Illness  past history of CVA with right hemiplegia, colon cander with colostomy bag, indwelling catheter, a fib,  admitted with confustion and UTI  Clinical Impression  Pt was not able to follow commands and required +2 total assist for all mobility.  She has bilateral plantarflexion contractures as well as right sided hemiplegia that would inhibit standing or pivot transfers.  She was able to sit on the edge of the bed with mod  Assist and another person for safety  and would benefit from PT to work on improving this so it would decrease burden of care for caregivers for ADLs. She will need a mechanical lift for tranfers. Recommend SNF vs. Home with 24 hour care with +2 for transfers, Home health PT for sitting balance work.     Follow Up Recommendations SNF;Supervision/Assistance - 24 hour    Equipment Recommendations  Other (comment) (mechanical lift )    Recommendations for Other Services       Precautions / Restrictions Precautions Precautions: Fall Restrictions Weight Bearing Restrictions: No      Mobility  Bed Mobility     Rolling: +2 for physical assistance   Supine to sit: +2 for physical assistance Sit to supine: +2 for safety/equipment   General bed mobility comments: pt with reaching out and grabbing with right hand and appeared to express fear of falling.  Not able to bear weight through feet to assist with balance due to plantarflexion contractures   Transfers Overall transfer level: Needs assistance Equipment used:  (mechanical lift recommended. )                Ambulation/Gait Ambulation/Gait assistance:  (not able to test due to inability to stand on feet )              Stairs            Wheelchair Mobility    Modified Rankin (Stroke Patients Only)       Balance Overall balance  assessment: Needs assistance Sitting-balance support: Single extremity supported;Feet unsupported (unable to weight bear through feet ) Sitting balance-Leahy Scale: Fair Sitting balance - Comments: pt was able to sit on edge of bed with assist of 2 -she needed to grab onto something with her left hand and neede another person for saftey  and appeard distressed after a few mintues.    Standing balance support: Single extremity supported                                 Pertinent Vitals/Pain Pain Assessment:  (grimaces at time, stops grimacing with repositioning )    Home Living Family/patient expects to be discharged to:: Other (Comment)                 Additional Comments: unknown     Prior Function Level of Independence:  (pt not able to report )               Hand Dominance        Extremity/Trunk Assessment   Upper Extremity Assessment: Difficult to assess due to impaired cognition (right arm in contracted position from CVA)           Lower Extremity Assessment: LLE deficits/detail   LLE Deficits / Details: bilateral plantarflexion contrature of both feet, right leg is in semi  extended position, but range of motion attempts are resisted.  Random active movements of left lower extremity, but does not follow commands to move it and resists passive range of motin      Communication   Communication: Expressive difficulties;Other (comment) (unable to assess comprehension..did not follow commands)  Cognition Arousal/Alertness: Awake/alert Behavior During Therapy: Restless                        General Comments      Exercises        Assessment/Plan    PT Assessment All further PT needs can be met in the next venue of care  PT Diagnosis Generalized weakness;Hemiplegia dominant side;Altered mental status   PT Problem List Decreased range of motion;Decreased activity tolerance;Decreased balance;Decreased mobility;Decreased cognition   PT Treatment Interventions Balance training;Functional mobility training   PT Goals (Current goals can be found in the Care Plan section) Acute Rehab PT Goals Patient Stated Goal: not able to state     Frequency Min 2X/week   Barriers to discharge   unknown home situation     Co-evaluation               End of Session   Activity Tolerance: Patient limited by fatigue Patient left: in bed;with call bell/phone within reach;with bed alarm set;with nursing/sitter in room Nurse Communication: Mobility status;Need for lift equipment    Functional Assessment Tool Used:  (clincal judgement ) Functional Limitation: Mobility: Walking and moving around Mobility: Walking and Moving Around Current Status (H8887): 100 percent impaired, limited or restricted Mobility: Walking and Moving Around Goal Status [phone]): At least 80 percent but less than 100 percent impaired, limited or restricted    Time: 8206-0156 PT Time Calculation (min) (ACUTE ONLY): 39 min   Charges:         PT G Codes:   PT G-Codes **NOT FOR INPATIENT CLASS** Functional Assessment Tool Used:  (clincal judgement ) Functional Limitation: Mobility: Walking and moving around Mobility: Walking and Moving Around Current Status (F5379): 100 percent impaired, limited or restricted Mobility: Walking and Moving Around Goal Status (K3276): At least 80 percent but less than 100 percent impaired, limited or restricted   Helene Kelp K. Owens Shark, PT   09/20/2015, 10:41 AM

## 2015-09-20 NOTE — Clinical Social Work Note (Signed)
Clinical Social Work Assessment  Patient Details  Name: [name] MRN: [id number] Date of Birth: 1930/01/17  Date of referral:  09/20/15               Reason for consult:  Facility Placement, Guardianship Needs                Permission sought to share information with:  Family Supports (granddaughter, primary caregiver) Permission granted to share information::  No  Name::     [name]  Agency::  [facility] [phone]- next of kin, APS reqeust permanent placement iwth gdtr  Relationship::  grandduaghter  Contact Information:  Primary caregiver, Margo Common [phone]  Housing/Transportation Living arrangements for the past 2 months:  [place] of Information:  Adult Children (adult grandchild ) Patient Interpreter Needed:  None Criminal Activity/Legal Involvement Pertinent to Current Situation/Hospitalization:  No - Comment as needed Significant Relationships:  Other Family Members Lives with:  Relatives (adult granddtr, adult great granddaughter, ) Do you feel safe going back to the place where you live?  Yes Need for family participation in patient care:  Yes (Comment)  Care giving concerns:     Facilities manager / plan: CSW spoke iwht pt granddaughter Materials engineer over the phone. Per ED note, pt granddaughter is primary caregiver, pt daughter, Saintclair Halsted is not allowed to receive any inofrmation or be contacted. Pt has been involved with APS, and they placed pt with granddaughter now twice, but this time will be permanent. Pt is only alert to self. Pt daughter was taking her medicaiton and leave pt alone for days at a time. Per granddaughter, pt has been living with her since September 2016, she came with pt to the ED, over she is currently at home taking care of her youngest son who has the stomach bug, but she will be coming to visit later today providing insurance information as well. Pt granddaughter shares that APS  has closed the case, but gave permission to speak with Toniann Ket if questions, [phone]. Pt grandduaghter is currently in the process of obtaining guardianship as advised by APS.   Pt granddaughter shares that physically this is her baseline, however she had started to hallucinate some and had been in touch with her PCP regarding this.   Pt granddaughter shares pt is mostly bed bound, they do have a hoyer lift and try to get her in the wheelchair or couch, however sometimes  it is painful for patient to sit in wheelchair.  Pt grandaughter said she's open to snf if needed, however this is her baseline and feels that between she, her daughter who is 61, and her ex husband who is still very involved and close with pt, they are able to handle her needs.   Pt is currently recieving home health services from [facility] and would like to continue but reuqesitng PT, OT, SW, RN, and AID if possible. Pt is followed by Dr. Fredderick Phenix of [facility] calls. Pt is unfortunately not eligable for medicaid per granddughter, as she makes $77 too much. Pt gdtr is looking into private duty sitter, to give family respite if needed in the future after holidays, as she said they can afford that when needed.   CSW consulting RN CM to assist with home health services.     Employment status:  Disabled (Comment on whether or not currently receiving Disability), Retired Forensic scientist:    PT Recommendations:  Skilled Nursing  Facility, 24 Hour Supervision Information / Referral to community resources:  [place]  Patient/Family's Response to care:    Patient/Family's Understanding of and Emotional Response to Diagnosis, Current Treatment, and Prognosis:  Pt family appreciative of physical therapy recommendations, and are able to provide 24 hour care and has proper equipment for patient at home. Pt family planning for pt to return home with continued [facility] services.   Emotional  Assessment Appearance:  Appears stated age Attitude/Demeanor/Rapport:  Unable to Assess (alert to self, pleasant and calm ) Affect (typically observed):  Calm Orientation:  Oriented to Self Alcohol / Substance use:  Never Used Psych involvement (Current and /or in the community):  No (Comment)  Discharge Needs  Concerns to be addressed:  Basic Needs Readmission within the last 30 days:  No Current discharge risk:  Cognitively Impaired Barriers to Discharge:  Barriers Resolved   Ahri Olson, [place], LCSW 09/20/2015, 11:00 AM

## 2015-09-20 NOTE — Evaluation (Signed)
Clinical/Bedside Swallow Evaluation Patient Details  Name: [name] MRN: [id number] Date of Birth: 1930/01/03  Today's Date: 09/20/2015 Time: SLP Start Time (ACUTE ONLY): [id number] SLP Stop Time (ACUTE ONLY): 1533 SLP Time Calculation (min) (ACUTE ONLY): 16 min  Past Medical History:  Past Medical History  Diagnosis Date  . COPD (chronic obstructive pulmonary disease) ([place])   . CHF (congestive heart failure) ([place])     ECHO 2011  EF: 60% to [id number], grd 1 distolic dysfxn, [id number] ECHO, tech limited by Afib  . Hypertension   . Stroke [facility]) 2008    rt side deficit  . A-fib ([place])   . Back pain   . Constipation   . SBO (small bowel obstruction) ([place])     per GI note in 2011, pt denies  . Upper GI bleed 2011    required hospitalization  . Colon polyps   . Tobacco abuse   . Diabetes mellitus     per daughter doctor took patient off DM medication 6 months ago   . Cancer ([place])   . Colon cancer ([place])   . Cirrhosis of liver [facility])    Past Surgical History:  Past Surgical History  Procedure Laterality Date  . Total abdominal hysterectomy  1980  . Colonoscopy N/A 09/04/2013    Procedure: COLONOSCOPY;  Surgeon: Lear Ng, MD;  Location[facility] ENDOSCOPY;  Service: Endoscopy;  Laterality: N/A;  . Colon resection N/A 09/06/2013    Procedure: PARTIAL COLECTOMY WITH COLOSTOMY;  Surgeon: Earnstine Regal, MD;  Location: [place];  Service: General;  Laterality: N/A;  . Colostomy revision N/A 09/08/2013    Procedure: COLOSTOMY REVISION;  Surgeon: Gwenyth Ober, MD;  Location: [place];  Service: General;  Laterality: N/A;  . Colostomy     HPI:  Patient is 81 year old female with known history of CVA with right-sided hemiparesis, colon cancer status post colostomy bag placement, chronic indwelling catheter and chronic A. fib on no anticoagulation due to high risk of fall. Admitted with sepsis due to UTI and question of LL pna. No SLP notes in history.    Assessment / Plan /  Recommendation Clinical Impression  Pt demonstrates adequate airway protection with thin liquids, no signs of aspiration. Mastication is slightly impaired even with denture in place with diffcutly lingually manipulating solid for mastication. Pt is recommended to downgrade diet to mechanical soft with thin liquids. No SLP f/u needed, will sign off.     Aspiration Risk  Mild aspiration risk    Diet Recommendation Dysphagia 3 (Mech soft);Thin liquid   Liquid Administration via: Cup;Straw Medication Administration: Whole meds with liquid Supervision: Staff to assist with self feeding Compensations: Slow rate;Small sips/bites Postural Changes: Seated upright at 90 degrees    Other  Recommendations Oral Care Recommendations: Oral care BID   Follow up Recommendations  None    Frequency and Duration            Prognosis        Swallow Study   General HPI: Patient is 81 year old female with known history of CVA with right-sided hemiparesis, colon cancer status post colostomy bag placement, chronic indwelling catheter and chronic A. fib on no anticoagulation due to high risk of fall. Admitted with sepsis due to UTI and question of LL pna. No SLP notes in history.  Type of Study: Bedside Swallow Evaluation Previous Swallow Assessment: none in cahrt Diet Prior to this Study: Regular;Thin liquids Temperature Spikes Noted: No Respiratory Status: Room air History of  Recent Intubation: No Behavior/Cognition: Alert;Doesn't follow directions Oral Cavity Assessment: Within Functional Limits Oral Care Completed by SLP: Yes (top denture cleaned) Oral Cavity - Dentition: Dentures, top Vision: Functional for self-feeding Self-Feeding Abilities: Needs assist Patient Positioning: Upright in bed Baseline Vocal Quality: Hoarse;Low vocal intensity Volitional Cough: Cognitively unable to elicit Volitional Swallow: Unable to elicit    Oral/Motor/Sensory Function Overall Oral Motor/Sensory Function:   (does not follow commands, no focal weakness)   Ice Chips     Thin Liquid Thin Liquid: Within functional limits Presentation: Cup;Straw    Nectar Thick Nectar Thick Liquid: Not tested   Honey Thick Honey Thick Liquid: Not tested   Puree Puree: Within functional limits   Solid Solid: Impaired Oral Phase Impairments: Reduced lingual movement/coordination;Impaired mastication Oral Phase Functional Implications: Impaired mastication      Herbie Baltimore, MA CCC-SLP [phone]  [name], [name] 09/20/2015,3:37 PM

## 2015-09-20 NOTE — Progress Notes (Signed)
Nutrition Brief Note  Patient identified on the Malnutrition Screening Tool (MST) Report  Wt Readings from Last 15 Encounters:  05/04/15 153 lb 1.6 oz (69.446 kg)  04/08/15 151 lb 3.8 oz (68.6 kg)  02/16/15 150 lb 2.1 oz (68.1 kg)  01/21/15 141 lb 8.6 oz (64.2 kg)  06/21/14 156 lb 11.2 oz (71.079 kg)  10/09/13 143 lb 11.2 oz (65.182 kg)  09/13/13 161 lb 4.8 oz (73.165 kg)    Current diet order is Carb Modified, patient is consuming approximately 75% of meals at this time. Labs and medications reviewed.   Patient has been assessed by RD staff in the past and found to have overall adequate intake with only brief, intermittent difficulty in meeting needs.  Currently, pt is unable to be weighed due to bed scale not tared and resting well with several pillows and blankets. No family at bedside.  Discussed with staff who report patient at well this morning with no problems chewing or swallowing.  No interventions required at this time.  Would recommend obtaining new weight if able.  If nutrition issues arise or patient undergoes change in nutrition status, please consult RD.   Brynda Greathouse, MS RD LDN Clinical Inpatient Dietitian Weekend/After hours pager: [phone]

## 2015-09-21 DIAGNOSIS — Z515 Encounter for palliative care: Secondary | ICD-10-CM | POA: Diagnosis present

## 2015-09-21 DIAGNOSIS — Z7189 Other specified counseling: Secondary | ICD-10-CM | POA: Diagnosis present

## 2015-09-21 LAB — CBC
HEMATOCRIT: 31.8 % — AB (ref 36.0–46.0)
Hemoglobin: 9.8 g/dL — ABNORMAL LOW (ref 12.0–15.0)
MCH: 26.8 pg (ref 26.0–34.0)
MCHC: 30.8 g/dL (ref 30.0–36.0)
MCV: 87.1 fL (ref 78.0–100.0)
Platelets: 462 10*3/uL — ABNORMAL HIGH (ref 150–400)
RBC: 3.65 MIL/uL — AB (ref 3.87–5.11)
RDW: 16.3 % — ABNORMAL HIGH (ref 11.5–15.5)
WBC: 10.9 10*3/uL — AB (ref 4.0–10.5)

## 2015-09-21 LAB — URINE CULTURE

## 2015-09-21 LAB — BASIC METABOLIC PANEL
ANION GAP: 11 (ref 5–15)
BUN: 52 mg/dL — ABNORMAL HIGH (ref 6–20)
CO2: 21 mmol/L — AB (ref 22–32)
Calcium: 8.7 mg/dL — ABNORMAL LOW (ref 8.9–10.3)
Chloride: 111 mmol/L (ref 101–111)
Creatinine, Ser: 1.9 mg/dL — ABNORMAL HIGH (ref 0.44–1.00)
GFR, EST AFRICAN AMERICAN: 27 mL/min — AB (ref >60)
GFR, EST NON AFRICAN AMERICAN: 23 mL/min — AB (ref >60)
GLUCOSE: 327 mg/dL — AB (ref 65–99)
POTASSIUM: 5.2 mmol/L — AB (ref 3.5–5.1)
Sodium: 143 mmol/L (ref 135–145)

## 2015-09-21 MED ORDER — CEFTRIAXONE SODIUM 1 G IJ SOLR
1.0000 g | INTRAMUSCULAR | Status: DC
  Administered 2015-09-22: 1 g via INTRAVENOUS
  Filled 2015-09-21: qty 10

## 2015-09-21 MED ORDER — ENOXAPARIN SODIUM 30 MG/0.3ML ~~LOC~~ SOLN
30.0000 mg | SUBCUTANEOUS | Status: DC
  Administered 2015-09-21: 30 mg via SUBCUTANEOUS
  Filled 2015-09-21 (×2): qty 0.3

## 2015-09-21 MED ORDER — PREDNISONE 20 MG PO TABS
40.0000 mg | ORAL_TABLET | Freq: Every day | ORAL | Status: DC
  Administered 2015-09-22: 40 mg via ORAL
  Filled 2015-09-21 (×2): qty 2

## 2015-09-21 NOTE — Progress Notes (Signed)
Patient ID: [name], female   DOB: 07-19-1930, 81 y.o.   MRN: [id number]  [facility] PROGRESS NOTE  [name] [id number] DOB: 25-Dec-1929 DOA: Sep 26, 2015 PCP: [name], MD   Brief narrative:    Patient is 81 year old female with known history of CVA with right-sided hemiparesis, colon cancer status post colostomy bag placement, chronic indwelling catheter and chronic A. fib on no anticoagulation due to high risk of fall. Patient was brought to emergency department apparently from home after primary care physician evaluated the patient and recommended her to go to emergency Department. Please note that in emergency department, patient is confused, unable to provide history, no family at bedside present. Per emergency room doctor report, patient recently had urinary tract infection that was positive for Pseudomonas and sensitive to gentamicin. She was given gentamicin dose in emergency department.  In emergency department, patient is hemodynamically stable, vital signs notable for heart rate in 130s, blood work notable for WBC 14, platelets 463, creatinine 1.2. [facility] asked to admit for further evaluation.   Assessment/Plan:    Principal Problem:  Sepsis due to Pseudomonas UTI and ? Left lobe PNA (HCC) - given one dose of gentamycin in ED - repeat urine culture requested, still pending  - urine culture with E. Coli with multiple resistances, change ABX to Rocephin  - blood cultures negative to date   Active Problems:   Left lobe PNA - continue Maxipime, continue to taper down steroids - continue with BD's as needed    Acute encephalopathy - secondary to the above, still rather confused  - monitor clinical response to ABX   UTI (urinary tract infection) due to urinary indwelling catheter ([place]) - chance ABX to Rocephin based on sensitivity report    AKI (acute kidney injury) ([place]) - Cr up despite IVF and now with hyperkalemia - check bladder scan, may  need to have foley replaced  - BMP in AM   Thrombocytosis (HCC) - reactive, monitor    Paroxysmal atrial fibrillation (HCC) - CHADS2 4-5, not on AC due to high risk fall - place on Metoprolol 25 mg PO QD and add Metoprolol inj 5 mg as needed  - monitor on telemetry    Adenocarcinoma, colon ([place]) - outpatient follow up    Dementia - confused at this time so need to monitor clinical response to abx     Recent C. Diff - still on oral vancomycin  - try to minimize ABS as possible    H/O: CVA (cerebrovascular accident)- with R Hemiparesis/contractures   DNR (do not resuscitate)  Lovenox SQ for DVT prophylaxis   Code Status: DNR Family Communication:  plan of care discussed with the patient Disposition Plan: Not ready for d/c yet  IV access:  Peripheral IV  Procedures and diagnostic studies:    Dg Chest Port 1 View 2015/09/26 Left much worse than right airspace disease worrisome for pneumonia is or less likely asymmetric edema.   Medical Consultants:  None  Other Consultants:  PT SLP - Dys III diet  Nutritionist   IAnti-Infectives:   Maxipime September 26, 2023 -->  [name], MD  [facility] Pager [phone]  If 7PM-7AM, please contact night-coverage [email or web address] Password [email or web address] 09/21/2015, 6:20 PM   LOS: 1 day   HPI/Subjective: No events overnight.   Objective: Filed Vitals:   09/20/15 1800 09/20/15 2143 09/21/15 0554 09/21/15 1500  BP:  152/88 143/99 137/90  Pulse:  145 144 74  Temp:  98.7 F (37.1 C) 99.3 F (37.4 C)  98.3 F (36.8 C)  TempSrc:  Oral Axillary Oral  Resp:  21 22 20   Weight: 77.565 kg (171 lb)     SpO2:  95% 95% 94%    Intake/Output Summary (Last 24 hours) at 09/21/15 1820 Last data filed at 09/21/15 [id number]  Gross per 24 hour  Intake    240 ml  Output    450 ml  Net   -210 ml    Exam:   General:  Pt is alert, confused, NAD  Cardiovascular: Irregular rhythm, tachycardic, no rubs, no gallops  Respiratory: Clear to  auscultation bilaterally, no wheezing, rales at bases L > R  Abdomen: Soft, non tender, non distended, bowel sounds present, no guarding  Data Reviewed: Basic Metabolic Panel:  Recent Labs Lab 09/19/15 1055 09/20/15 0522 09/21/15 0605  NA 141 141 143  K 4.2 4.6 5.2*  CL 108 113* 111  CO2 25 19* 21*  GLUCOSE 170* 219* 327*  BUN 26* 28* 52*  CREATININE 1.20* 1.28* 1.90*  CALCIUM 8.7* 8.3* 8.7*   Liver Function Tests:  Recent Labs Lab 09/19/15 1055  AST 20  ALT 10*  ALKPHOS 69  BILITOT 1.1  PROT 7.5  ALBUMIN 2.7*   CBC:  Recent Labs Lab 09/19/15 1055 09/19/15 1525 09/20/15 0522 09/21/15 0605  WBC 14.0* 11.7* 9.6 10.9*  NEUTROABS 11.5* 9.3*  --   --   HGB 10.3* 10.6* 10.4* 9.8*  HCT 34.3* 34.8* 34.3* 31.8*  MCV 89.6 88.1 87.9 87.1  PLT 463* 326 371 462*    Recent Results (from the past 240 hour(s))  Culture, blood (x 2)     Status: None (Preliminary result)   Collection Time: 09/19/15  3:20 PM  Result Value Ref Range Status   Specimen Description BLOOD LEFT ARM  Final   Special Requests IN PEDIATRIC BOTTLE [place]  Final   Culture   Final    NO GROWTH 2 DAYS Performed at [facility]    Report Status PENDING  Incomplete  Culture, blood (x 2)     Status: None (Preliminary result)   Collection Time: 09/19/15  3:25 PM  Result Value Ref Range Status   Specimen Description BLOOD LEFT HAND  Final   Special Requests IN PEDIATRIC BOTTLE 3CC  Final   Culture   Final    NO GROWTH 2 DAYS Performed at [facility]    Report Status PENDING  Incomplete  Urine culture     Status: None   Collection Time: 09/19/15  4:06 PM  Result Value Ref Range Status   Specimen Description URINE, CATHETERIZED  Final   Special Requests NONE  Final   Culture   Final    >=100,000 COLONIES/mL ESCHERICHIA COLI Performed at [facility]    Report Status 09/21/2015 FINAL  Final   Organism ID, Bacteria ESCHERICHIA COLI  Final      Susceptibility   Escherichia  coli - MIC*    AMPICILLIN >=32 RESISTANT Resistant     CEFAZOLIN >=64 RESISTANT Resistant     CEFTRIAXONE <=1 SENSITIVE Sensitive     CIPROFLOXACIN >=4 RESISTANT Resistant     GENTAMICIN >=16 RESISTANT Resistant     IMIPENEM <=0.25 SENSITIVE Sensitive     NITROFURANTOIN 32 SENSITIVE Sensitive     TRIMETH/SULFA <=20 SENSITIVE Sensitive     AMPICILLIN/SULBACTAM >=32 RESISTANT Resistant     PIP/TAZO >=128 RESISTANT Resistant     * >=100,000 COLONIES/mL ESCHERICHIA COLI     Scheduled Meds: . aspirin EC  81 mg Oral Daily  . ceFEPime (MAXIPIME) IV  1 g Intravenous Q24H  . enoxaparin (LOVENOX) injection  30 mg Subcutaneous Q24H  . feeding supplement (ENSURE ENLIVE)  237 mL Oral BID BM  . gabapentin  100 mg Oral BID  . lactobacillus  1 g Oral TID WC  . nortriptyline  50 mg Oral QHS  . oxybutynin  10 mg Oral Daily  . predniSONE  50 mg Oral Q breakfast  . QUEtiapine  25 mg Oral QHS  . vancomycin  125 mg Oral 4 times per day   Continuous Infusions:

## 2015-09-21 NOTE — Progress Notes (Signed)
Had episode of SOB,  Gave prn neb tx.  Sats went from 90% to 100%.  Patient resting at this time.  Virginia Rochester, RN

## 2015-09-21 NOTE — Consult Note (Signed)
Consultation Note Date: 09/21/2015   Patient Name: [name]  DOB: 1930-08-01  MRN: [id number]  Age / Sex: 81 y.o., female  PCP: [name], MD Referring Physician: Theodis Blaze, MD  Reason for Consultation: Establishing goals of care    Clinical Assessment/Narrative:  Patient is 81 year old female with known history of CVA with right-sided hemiparesis, colon cancer status post colostomy bag placement, chronic indwelling catheter and chronic A. fib on no anticoagulation due to high risk of fall. Patient was brought to emergency department apparently from home after primary care physician evaluated the patient and recommended her to go to emergency Department. Please note that in emergency department, patient is confused, unable to provide history, no family at bedside present. Per emergency room doctor report, patient recently had urinary tract infection that was positive for Pseudomonas and sensitive to gentamicin. She was given gentamicin dose in emergency department.  The patient is admitted to the hospitalist service since 09-19-15 for sepsis due to pneumonia urinary tract infection and possible left lobe pneumonia. She is on Maxipime. She was given 1 dose of gentamicin in the emergency department. Palliative care consult for goals of care discussions.  Social worker note reviewed. Unfortunately, patient's daughter Lemmie Evens numbers still listed as her emergency contact in the demographic section at [phone]. This has been addended by the undersigned. Updated demographics information to reflect that patient's granddaughter Reagann Basgall at [phone] is the patient's health care power of attorney agent.   Call placed and discussed extensively with granddaughter Chassidy at [phone]. The patient has been living with her granddaughter for the past several months. Prior to that she lived at home with  daughter [name]. It is reported that Adult Protective Services got involved when the patient was living with daughter [name] owing to allegations of drug diversion.   Chassidy states that the patient has chronic indwelling Foley and recurrent urinary tract infections. She has physicians home visit. Chassidy states that the patient has recently had increase in cervical. Patient has been having bladder spasms and pain with insertion of Foley catheter. She was diagnosed with a urinary tract infection and had blood in her Foley bag just a few days prior to her hospitalization. She has had gradual decline. She had physical therapy at home earlier this year but was discharged because she was not making any progress. Goals of care discussions undertaken with granddaughter Chassidy. Discussed about discharging the patient home with hospice. Chassidy states that she has been going to the Internet and trying to learn more about hospice/palliative services. She is also contemplating whether the patient or to go to skilled nursing facility for rehabilitation attempt with palliative follow-up. She is hesitant to proceed with skilled nursing facility because she states the patient went to [facility] in the past and did not like it and got scared.  Answered all of Chassidy's questions. palliative will follow-up and help with appropriate disposition planning.    Contacts/Participants in Discussion: Primary Decision Maker: Robet Leu Relationship to Patient granddaughter HCPOA: yes  [phone]  SUMMARY OF RECOMMENDATIONS: DO NOT RESUSCITATE Disposition planning/goals of care discussions with granddaughter [place] of attorney agent at [phone]:  Discussed about addition of hospice services and discharge home with hospice towards the end of this hospitalization. Granddaughter has discussed with social worker about possible skilled nursing facility for rehabilitation with palliative  follow-up. She states she is hesitant to proceed with SNF placement because the patient would probably prefer home setting.  Will request case management for  hospice consultation-initial consultation for hospice liaison to establish contact with healthcare power of attorney agent granddaughter Primitivo Gauze at [phone].  Code Status/Advance Care Planning: DNR    Code Status Orders        Start     Ordered   09/19/15 1422  Do not attempt resuscitation (DNR)   Continuous    Question Answer Comment  In the event of cardiac or respiratory ARREST Do not call a "code blue"   In the event of cardiac or respiratory ARREST Do not perform Intubation, CPR, defibrillation or ACLS   In the event of cardiac or respiratory ARREST Use medication by any route, position, wound care, and other measures to relive pain and suffering. May use oxygen, suction and manual treatment of airway obstruction as needed for comfort.      09/19/15 1421      Other Directives:Advanced Directive  Symptom Management:   As above  Palliative Prophylaxis:   Delirium Protocol  Additional Recommendations (Limitations, Scope, Preferences): Case management for hospice consult  Psycho-social/Spiritual:  Support System: [place] Desire for further Chaplaincy support:no Additional Recommendations: Caregiving  Support/Resources  Prognosis: Guarded, ? possibly few months  Discharge Planning: Pending further discussions with palliative, attending M.D., case management/social worker. Likely home with hospice versus SNF rehabilitation with palliative follow up.    Chief Complaint/ Primary Diagnoses: Present on Admission:  . Acute encephalopathy . Adenocarcinoma, colon ([place]) . AKI (acute kidney injury) ([place]) . DNR (do not resuscitate) . Paroxysmal atrial fibrillation (HCC) . UTI (urinary tract infection) due to urinary indwelling catheter ([place]) . Dementia . Sepsis due to Pseudomonas UTI ([place]) . Thrombocytosis ([place]) .  UTI (lower urinary tract infection)  I have reviewed the medical record, interviewed the patient and family, and examined the patient. The following aspects are pertinent.  Past Medical History  Diagnosis Date  . COPD (chronic obstructive pulmonary disease) ([place])   . CHF (congestive heart failure) ([place])     ECHO 2011  EF: 60% to [id number], grd 1 distolic dysfxn, [id number] ECHO, tech limited by Afib  . Hypertension   . Stroke [facility] 2008    rt side deficit  . A-fib ([place])   . Back pain   . Constipation   . SBO (small bowel obstruction) ([place])     per GI note in 2011, pt denies  . Upper GI bleed 2011    required hospitalization  . Colon polyps   . Tobacco abuse   . Diabetes mellitus     per daughter doctor took patient off DM medication 6 months ago   . Cancer ([place])   . Colon cancer ([place])   . Cirrhosis of liver [facility])    Social History   Social History  . Marital Status: Widowed    Spouse Name: N/A  . Number of Children: N/A  . Years of Education: N/A   Social History Main Topics  . Smoking status: Current Some Day Smoker -- 0.50 packs/day for 15 years    Types: Cigarettes  . Smokeless tobacco: Never Used  . Alcohol Use: No  . Drug Use: No  . Sexual Activity: No   Other Topics Concern  . None   Social History Narrative   Widowed since     does not live with w/daughter, [name]: Granddaughter Chassidy has healthcare power of attorney now. Do not call [name]    Prior employment chef and Doctor, hospital   Minimal ambulation-has W/C and HoverRound   Incontinent of bladder   Colostomy-  Family History  Problem Relation Age of Onset  . Lung cancer Mother   . Heart disease Father    Scheduled Meds: . aspirin EC  81 mg Oral Daily  . ceFEPime (MAXIPIME) IV  1 g Intravenous Q24H  . enoxaparin (LOVENOX) injection  30 mg Subcutaneous Q24H  . feeding supplement (ENSURE ENLIVE)  237 mL Oral BID BM  . gabapentin  100 mg Oral BID  . lactobacillus  1 g Oral TID WC  . nortriptyline   50 mg Oral QHS  . oxybutynin  10 mg Oral Daily  . predniSONE  50 mg Oral Q breakfast  . QUEtiapine  25 mg Oral QHS  . vancomycin  125 mg Oral 4 times per day   Continuous Infusions:  PRN Meds:.ipratropium-albuterol, LORazepam, metoprolol, nitroGLYCERIN, oxyCODONE, prochlorperazine Medications Prior to Admission:  Prior to Admission medications   Medication Sig Start Date End Date Taking? Authorizing Provider  acetaminophen (TYLENOL) 325 MG tablet Take 650 mg by mouth every 6 (six) hours as needed for moderate pain or headache.   Yes Historical Provider, MD  baclofen (LIORESAL) 10 MG tablet Take 10 mg by mouth 3 (three) times daily.   Yes Historical Provider, MD  ciprofloxacin (CIPRO) 250 MG tablet Take 250 mg by mouth 2 (two) times daily.   Yes Historical Provider, MD  gabapentin (NEURONTIN) 100 MG capsule Take 2 capsules (200 mg total) by mouth 3 (three) times daily. 01/25/15  Yes Silver Huguenin Elgergawy, MD  lisinopril (PRINIVIL,ZESTRIL) 2.5 MG tablet Take 2.5 mg by mouth daily.   Yes Historical Provider, MD  Melaton-Thean-Cham-PassF-LBalm (SLEEP PO) Take 1 tablet by mouth at bedtime as needed (sleep). Restful Sleep, without benadryl (family history of benadryl allergy)   Yes Historical Provider, MD  Naproxen Sodium (ALEVE) 220 MG CAPS Take 220 mg by mouth every 12 (twelve) hours as needed (pain).   Yes Historical Provider, MD  nortriptyline (PAMELOR) 25 MG capsule Take 50 mg by mouth at bedtime.   Yes Historical Provider, MD  Phenazopyridine HCl (VH ESSENTIALS UTI RELIEF PO) Take 1 tablet by mouth daily. All Natural Cranberry   Yes Historical Provider, MD  QUEtiapine (SEROQUEL) 25 MG tablet Take 37.5 mg by mouth at bedtime.  01/03/15  Yes Historical Provider, MD  traMADol (ULTRAM) 50 MG tablet Take 1 tablet (50 mg total) by mouth every 6 (six) hours as needed. pain 04/11/15  Yes Janece Canterbury, MD  nitroGLYCERIN (NITROSTAT) 0.4 MG SL tablet Place 0.4 mg under the tongue every 5 (five) minutes as  needed for chest pain. Reported on 09/19/2015    Historical Provider, MD  oxybutynin (DITROPAN-XL) 10 MG 24 hr tablet Take 10 mg by mouth daily. Reported on 09/19/2015    Historical Provider, MD   Allergies  Allergen Reactions  . Aggrenox [Aspirin-Dipyridamole Er] Diarrhea and Nausea And Vomiting    & abdominal bleeding    Review of Systems Denies pain Physical Exam Elderly lady opens eyes when name is called resting in bed appears chronically ill no acute distress Safety sitter at bedside Lungs shallow diminished Abdomen colostomy bag mild distention Extremities trace edema  Vital Signs: BP 143/99 mmHg  Pulse 144  Temp(Src) 99.3 F (37.4 C) (Axillary)  Resp 22  Wt 77.565 kg (171 lb)  SpO2 95%  SpO2: SpO2: 95 % O2 Device:SpO2: 95 % O2 Flow Rate: .   IO: Intake/output summary:  Intake/Output Summary (Last 24 hours) at 09/21/15 1347 Last data filed at 09/21/15 0657  Gross per 24 hour  Intake    240 ml  Output    450 ml  Net   -210 ml    LBM:   Baseline Weight: Weight: 77.565 kg (171 lb) Most recent weight: Weight: 77.565 kg (171 lb)      Palliative Assessment/Data:  Flowsheet Rows        Most Recent Value   Intake Tab    Referral Department  Hospitalist   Unit at Time of Referral  Med/Surg Unit   Palliative Care Primary Diagnosis  Sepsis/Infectious Disease   Palliative Care Type  New Palliative care   Reason for referral  Clarify Goals of Care, Counsel Regarding Hospice   Date first seen by Palliative Care  09/21/15   Clinical Assessment    Palliative Performance Scale Score  30%   Pain Max last 24 hours  4   Pain Min Last 24 hours  3   Dyspnea Max Last 24 Hours  4   Dyspnea Min Last 24 hours  3   Psychosocial & Spiritual Assessment    Palliative Care Outcomes       Additional Data Reviewed:  CBC:    Component Value Date/Time   WBC 10.9* 09/21/2015 0605   HGB 9.8* 09/21/2015 0605   HCT 31.8* 09/21/2015 0605   PLT 462* 09/21/2015 0605   MCV 87.1  09/21/2015 0605   NEUTROABS 9.3* 09/19/2015 1525   LYMPHSABS 1.5 09/19/2015 1525   MONOABS 0.7 09/19/2015 1525   EOSABS 0.1 09/19/2015 1525   BASOSABS 0.0 09/19/2015 1525   Comprehensive Metabolic Panel:    Component Value Date/Time   NA 143 09/21/2015 0605   K 5.2* 09/21/2015 0605   CL 111 09/21/2015 0605   CO2 21* 09/21/2015 0605   BUN 52* 09/21/2015 0605   CREATININE 1.90* 09/21/2015 0605   GLUCOSE 327* 09/21/2015 0605   CALCIUM 8.7* 09/21/2015 0605   AST 20 09/19/2015 1055   ALT 10* 09/19/2015 1055   ALKPHOS 69 09/19/2015 1055   BILITOT 1.1 09/19/2015 1055   PROT 7.5 09/19/2015 1055   ALBUMIN 2.7* 09/19/2015 1055     Time In: 1030 Time Out: 1130 Time Total: 60 Greater than 50%  of this time was spent counseling and coordinating care related to the above assessment and plan.  Signed by: Loistine Chance, MD [id number] Loistine Chance, MD  09/21/2015, 1:47 PM  Please contact Palliative Medicine Team phone at [phone] for questions and concerns.

## 2015-09-22 ENCOUNTER — Inpatient Hospital Stay (HOSPITAL_COMMUNITY): Payer: Medicare Other

## 2015-09-22 ENCOUNTER — Encounter (HOSPITAL_COMMUNITY): Payer: Self-pay | Admitting: Radiology

## 2015-09-22 DIAGNOSIS — E872 Acidosis, unspecified: Secondary | ICD-10-CM | POA: Diagnosis not present

## 2015-09-22 DIAGNOSIS — T83510A Infection and inflammatory reaction due to cystostomy catheter, initial encounter: Secondary | ICD-10-CM

## 2015-09-22 DIAGNOSIS — Z7189 Other specified counseling: Secondary | ICD-10-CM

## 2015-09-22 DIAGNOSIS — N390 Urinary tract infection, site not specified: Secondary | ICD-10-CM

## 2015-09-22 DIAGNOSIS — Z515 Encounter for palliative care: Secondary | ICD-10-CM

## 2015-09-22 DIAGNOSIS — A419 Sepsis, unspecified organism: Secondary | ICD-10-CM

## 2015-09-22 DIAGNOSIS — E875 Hyperkalemia: Secondary | ICD-10-CM | POA: Diagnosis not present

## 2015-09-22 LAB — PROTIME-INR
INR: 2.03 — AB (ref 0.00–1.49)
Prothrombin Time: 22.8 seconds — ABNORMAL HIGH (ref 11.6–15.2)

## 2015-09-22 LAB — BASIC METABOLIC PANEL
ANION GAP: 14 (ref 5–15)
ANION GAP: 14 (ref 5–15)
Anion gap: 14 (ref 5–15)
BUN: 72 mg/dL — AB (ref 6–20)
BUN: 83 mg/dL — ABNORMAL HIGH (ref 6–20)
BUN: 86 mg/dL — AB (ref 6–20)
CHLORIDE: 110 mmol/L (ref 101–111)
CHLORIDE: 111 mmol/L (ref 101–111)
CO2: 17 mmol/L — ABNORMAL LOW (ref 22–32)
CO2: 17 mmol/L — AB (ref 22–32)
CO2: 16 mmol/L — AB (ref 22–32)
CREATININE: 2.42 mg/dL — AB (ref 0.44–1.00)
CREATININE: 2.88 mg/dL — AB (ref 0.44–1.00)
CREATININE: 2.95 mg/dL — AB (ref 0.44–1.00)
Calcium: 8.5 mg/dL — ABNORMAL LOW (ref 8.9–10.3)
Calcium: 8.4 mg/dL — ABNORMAL LOW (ref 8.9–10.3)
Calcium: 8 mg/dL — ABNORMAL LOW (ref 8.9–10.3)
Chloride: 110 mmol/L (ref 101–111)
GFR calc Af Amer: 20 mL/min — ABNORMAL LOW (ref >60)
GFR calc Af Amer: 16 mL/min — ABNORMAL LOW (ref >60)
GFR calc non Af Amer: 14 mL/min — ABNORMAL LOW (ref >60)
GFR calc non Af Amer: 14 mL/min — ABNORMAL LOW (ref >60)
GFR, EST AFRICAN AMERICAN: 16 mL/min — AB (ref >60)
GFR, EST NON AFRICAN AMERICAN: 17 mL/min — AB (ref >60)
Glucose, Bld: 314 mg/dL — ABNORMAL HIGH (ref 65–99)
Glucose, Bld: 345 mg/dL — ABNORMAL HIGH (ref 65–99)
Glucose, Bld: 307 mg/dL — ABNORMAL HIGH (ref 65–99)
POTASSIUM: 5.9 mmol/L — AB (ref 3.5–5.1)
POTASSIUM: 6.2 mmol/L — AB (ref 3.5–5.1)
Potassium: 6.3 mmol/L (ref 3.5–5.1)
SODIUM: 141 mmol/L (ref 135–145)
SODIUM: 141 mmol/L (ref 135–145)
SODIUM: 141 mmol/L (ref 135–145)

## 2015-09-22 LAB — LACTIC ACID, PLASMA
Lactic Acid, Venous: 3.5 mmol/L (ref 0.5–2.0)
Lactic Acid, Venous: 4.3 mmol/L (ref 0.5–2.0)

## 2015-09-22 LAB — GLUCOSE, CAPILLARY
Glucose-Capillary: 283 mg/dL — ABNORMAL HIGH (ref 65–99)
Glucose-Capillary: 273 mg/dL — ABNORMAL HIGH (ref 65–99)

## 2015-09-22 LAB — CBC WITH DIFFERENTIAL/PLATELET
BASOS PCT: 0 %
Basophils Absolute: 0 10*3/uL (ref 0.0–0.1)
EOS ABS: 0 10*3/uL (ref 0.0–0.7)
Eosinophils Relative: 0 %
HEMATOCRIT: 38.9 % (ref 36.0–46.0)
Hemoglobin: 11.5 g/dL — ABNORMAL LOW (ref 12.0–15.0)
Lymphocytes Relative: 12 %
Lymphs Abs: 1.6 10*3/uL (ref 0.7–4.0)
MCH: 26.3 pg (ref 26.0–34.0)
MCHC: 29.6 g/dL — AB (ref 30.0–36.0)
MCV: 88.8 fL (ref 78.0–100.0)
MONO ABS: 1 10*3/uL (ref 0.1–1.0)
MONOS PCT: 8 %
NEUTROS ABS: 10.7 10*3/uL — AB (ref 1.7–7.7)
Neutrophils Relative %: 80 %
Platelets: 474 10*3/uL — ABNORMAL HIGH (ref 150–400)
RBC: 4.38 MIL/uL (ref 3.87–5.11)
RDW: 16.6 % — AB (ref 11.5–15.5)
WBC: 13.4 10*3/uL — ABNORMAL HIGH (ref 4.0–10.5)

## 2015-09-22 LAB — AMMONIA: AMMONIA: 24 umol/L (ref 9–35)

## 2015-09-22 LAB — SODIUM, URINE, RANDOM: SODIUM UR: 10 mmol/L

## 2015-09-22 LAB — CBC
HCT: 35.3 % — ABNORMAL LOW (ref 36.0–46.0)
Hemoglobin: 11.2 g/dL — ABNORMAL LOW (ref 12.0–15.0)
MCH: 27.7 pg (ref 26.0–34.0)
MCHC: 31.7 g/dL (ref 30.0–36.0)
MCV: 87.2 fL (ref 78.0–100.0)
PLATELETS: 470 10*3/uL — AB (ref 150–400)
RBC: 4.05 MIL/uL (ref 3.87–5.11)
RDW: 16.4 % — ABNORMAL HIGH (ref 11.5–15.5)
WBC: 17 10*3/uL — AB (ref 4.0–10.5)

## 2015-09-22 LAB — APTT: aPTT: 29 seconds (ref 24–37)

## 2015-09-22 LAB — MRSA PCR SCREENING: MRSA by PCR: INVALID — AB

## 2015-09-22 LAB — HEPATIC FUNCTION PANEL
ALBUMIN: 2.8 g/dL — AB (ref 3.5–5.0)
ALK PHOS: 52 U/L (ref 38–126)
ALT: 1270 U/L — AB (ref 14–54)
AST: 2059 U/L — AB (ref 15–41)
BILIRUBIN TOTAL: 0.9 mg/dL (ref 0.3–1.2)
Bilirubin, Direct: 0.4 mg/dL (ref 0.1–0.5)
Indirect Bilirubin: 0.5 mg/dL (ref 0.3–0.9)
TOTAL PROTEIN: 7.1 g/dL (ref 6.5–8.1)

## 2015-09-22 LAB — CREATININE, URINE, RANDOM: Creatinine, Urine: 170.75 mg/dL

## 2015-09-22 LAB — PROCALCITONIN: Procalcitonin: 0.28 ng/mL

## 2015-09-22 LAB — TSH: TSH: 0.809 u[IU]/mL (ref 0.350–4.500)

## 2015-09-22 MED ORDER — HEPARIN SODIUM (PORCINE) 5000 UNIT/ML IJ SOLN
5000.0000 [IU] | Freq: Three times a day (TID) | INTRAMUSCULAR | Status: DC
  Administered 2015-09-22: 5000 [IU] via SUBCUTANEOUS
  Filled 2015-09-22 (×4): qty 1

## 2015-09-22 MED ORDER — AMIODARONE HCL 200 MG PO TABS: 200.0000 mg | ORAL_TABLET | Freq: Every day | ORAL | Status: DC

## 2015-09-22 MED ORDER — AMIODARONE HCL 200 MG PO TABS: 200.0000 mg | ORAL_TABLET | Freq: Two times a day (BID) | ORAL | Status: DC

## 2015-09-22 MED ORDER — AMIODARONE LOAD VIA INFUSION
150.0000 mg | Freq: Once | INTRAVENOUS | Status: AC
  Administered 2015-09-22: 150 mg via INTRAVENOUS
  Filled 2015-09-22: qty 83.34

## 2015-09-22 MED ORDER — DEXTROSE 5 % IV SOLN: 1.0000 g | INTRAVENOUS | Status: DC

## 2015-09-22 MED ORDER — LIP MEDEX EX OINT
TOPICAL_OINTMENT | CUTANEOUS | Status: AC
  Administered 2015-09-22: 19:00:00
  Filled 2015-09-22: qty 7

## 2015-09-22 MED ORDER — PREDNISONE 20 MG PO TABS
30.0000 mg | ORAL_TABLET | Freq: Every day | ORAL | Status: DC
  Filled 2015-09-22: qty 1

## 2015-09-22 MED ORDER — INSULIN ASPART 100 UNIT/ML ~~LOC~~ SOLN
0.0000 [IU] | SUBCUTANEOUS | Status: DC
  Administered 2015-09-22: 4 [IU] via SUBCUTANEOUS
  Administered 2015-09-22 – 2015-09-23 (×2): 5 [IU] via SUBCUTANEOUS

## 2015-09-22 MED ORDER — PHENYLEPHRINE HCL 10 MG/ML IJ SOLN
30.0000 ug/min | INTRAMUSCULAR | Status: DC
  Administered 2015-09-22: 30 ug/min via INTRAVENOUS
  Administered 2015-09-23: 70 ug/min via INTRAVENOUS
  Filled 2015-09-22 (×2): qty 1

## 2015-09-22 MED ORDER — AMIODARONE HCL IN DEXTROSE 360-4.14 MG/200ML-% IV SOLN
30.0000 mg/h | INTRAVENOUS | Status: DC
  Administered 2015-09-23: 30 mg/h via INTRAVENOUS

## 2015-09-22 MED ORDER — LORAZEPAM 2 MG/ML IJ SOLN: 1.0000 mg | Freq: Two times a day (BID) | INTRAMUSCULAR | Status: DC | PRN

## 2015-09-22 MED ORDER — SODIUM CHLORIDE 0.9 % IV BOLUS (SEPSIS)
500.0000 mL | Freq: Once | INTRAVENOUS | Status: AC
  Administered 2015-09-22: 500 mL via INTRAVENOUS

## 2015-09-22 MED ORDER — VANCOMYCIN HCL IN DEXTROSE 1-5 GM/200ML-% IV SOLN
1000.0000 mg | INTRAVENOUS | Status: DC
  Administered 2015-09-22: 1000 mg via INTRAVENOUS
  Filled 2015-09-22: qty 200

## 2015-09-22 MED ORDER — AMIODARONE HCL IN DEXTROSE 360-4.14 MG/200ML-% IV SOLN
60.0000 mg/h | INTRAVENOUS | Status: DC
  Administered 2015-09-22 (×2): 60 mg/h via INTRAVENOUS
  Filled 2015-09-22 (×2): qty 200

## 2015-09-22 MED ORDER — PIPERACILLIN-TAZOBACTAM IN DEX 2-0.25 GM/50ML IV SOLN
2.2500 g | Freq: Four times a day (QID) | INTRAVENOUS | Status: DC
  Filled 2015-09-22: qty 50

## 2015-09-22 MED ORDER — DEXTROSE 50 % IV SOLN: 50.0000 mL | Freq: Once | INTRAVENOUS | Status: DC

## 2015-09-22 MED ORDER — ACETAMINOPHEN 650 MG RE SUPP
650.0000 mg | Freq: Four times a day (QID) | RECTAL | Status: DC | PRN
  Administered 2015-09-22 – 2015-09-23 (×2): 650 mg via RECTAL
  Filled 2015-09-22 (×2): qty 1

## 2015-09-22 MED ORDER — INSULIN ASPART 100 UNIT/ML ~~LOC~~ SOLN
10.0000 [IU] | Freq: Once | SUBCUTANEOUS | Status: DC
  Filled 2015-09-22: qty 0.1

## 2015-09-22 MED ORDER — SODIUM CHLORIDE 0.9 % IV SOLN
250.0000 mg | Freq: Two times a day (BID) | INTRAVENOUS | Status: DC
  Administered 2015-09-22: 250 mg via INTRAVENOUS
  Filled 2015-09-22 (×3): qty 250

## 2015-09-22 MED ORDER — CALCIUM GLUCONATE 10 % IV SOLN: 1.0000 g | Freq: Once | INTRAVENOUS | Status: DC

## 2015-09-22 MED ORDER — SODIUM CHLORIDE 0.9 % IV SOLN
INTRAVENOUS | Status: DC
  Administered 2015-09-22 (×2): via INTRAVENOUS

## 2015-09-22 MED ORDER — SODIUM POLYSTYRENE SULFONATE 15 GM/60ML PO SUSP
30.0000 g | Freq: Once | ORAL | Status: DC
  Filled 2015-09-22: qty 120

## 2015-09-22 NOTE — Progress Notes (Addendum)
Patient ID: [name], female   DOB: Jun 24, 1930, 81 y.o.   MRN: [id number]  [facility] PROGRESS NOTE  KASIAH [name] [id number] DOB: [birth year] DOA: 09/19/2015 PCP: [name] Poll, MD   Brief narrative:    Patient is 81 year old female with known history of CVA with right-sided hemiparesis, colon cancer status post colostomy bag placement, chronic indwelling catheter and chronic A. fib on no anticoagulation due to high risk of fall. Patient was brought to emergency department apparently from home after primary care physician evaluated the patient and recommended her to go to emergency Department. Please note that in emergency department, patient is confused, unable to provide history, no family at bedside present. Per emergency room doctor report, patient recently had urinary tract infection that was positive for Pseudomonas and sensitive to gentamicin. She was given gentamicin dose in emergency department.  In emergency department, patient is hemodynamically stable, vital signs notable for heart rate in 130s, blood work notable for WBC 14, platelets 463, creatinine 1.2. [facility] asked to admit for further evaluation.   Major events since admission: 12/23 - admitted for sepsis for presumptive UTI and ? LLL PNA, started on maxipime, PCT consulted  12/25 - urine culture with E. Coli multidrug resistance, changed ABX to Rocephin per sensitivity report 12/26 - more lethargic, worsening renal failure, transfer to SDU, PCCM consulted   Microbiology data: Urine culture 12/23 - E. Coli sensitivity to Rocephin  Blood cultures 12/23 - no growth in 3 days  Urine culture 12/26 - pending  Blood culture 12/26  - pending   Assessment/Plan:    Principal Problem:  Sepsis due to Pseudomonas UTI and ? Left lobe PNA (HCC) - given one dose of gentamycin in ED for pseudomonas UTI that was reported by her PCP - please note that pt was initially started on Maxipime but that was changed once we  got urine culture back that was positive for E. Coli and with multidrug resistance - urine culture noted sensitivity to Rocephin and that was started on 12/25  - pt became more lethargic on 12/26 and with Tmax 102 F9 reported per nurse), lactic acid > 3, sepsis protocol re initiated and abx coverage broadened to vanc and Primaxin for now, can not do Zosyn as urine sensitivity report indicates resistance to Zosyn  - follow up on procalcitonin and repeat blood cultures  - transfer to SDU and consulted PCCM   Active Problems:   ? Left lobe PNA - was on maxipime since admission but CT chest done today is not convincing for PNA - continue with BD's as needed  - pt was given solumedrol in ED due to wheezing, no wheezing this am, continue to taper down steroids     Acute kidney injury with hyperkalemia and metabolic acidosis  - no specific nephrotoxic medications given to pt - ? Gentamycin that was given in ED  - requested renal US, U Na and U Cr - hold off on IVF for now duet bilateral PE - may need nephrology consult but will hold off for now until renal US and other studies are back  - challenging situation, placed now on vanc but worried this may make renal function worse - if procalcitonin negative, would stop vanc as soon as possible - d/w with PCCM  - also gave one dose of kayexalate    Acute encephalopathy - secondary to the above, still rather confused  - transfer to SDU    UTI (urinary tract infection) due to urinary indwelling  catheter ([place]) - ABX as noted above    Thrombocytosis ([place]) - reactive, monitor    Paroxysmal atrial fibrillation (HCC) - CHADS2 4-5, not on AC due to high risk fall - place on Metoprolol 25 mg PO QD and add Metoprolol inj 5 mg as needed  - monitor on telemetry    Adenocarcinoma, colon ([name]) - outpatient follow up    Dementia - has been confused since admission  - now lethargic, PCT consulted     Recent C. Diff - completed oral vanc    H/O: CVA (cerebrovascular accident)- with R Hemiparesis/contractures   DNR (do not resuscitate)  Lovenox SQ for DVT prophylaxis   Code Status: DNR Family Communication: attempted to call granddaughter several times, unable to leave message  Disposition Plan: transfer to SDU   IV access:  Peripheral IV  Procedures and diagnostic studies:    Dg Chest Port 1 View 10-09-2015 Left much worse than right airspace disease worrisome for pneumonia is or less likely asymmetric edema.   Medical Consultants:  PCT PCCM   Other Consultants:  PT SLP - Dys III diet  Nutritionist   IAnti-Infectives:   Maxipime 09-Oct-2023 --> 12/25 Rocephin 12/25  -> 12/26 Vancomycin 12/26 --> Primaxin 12/26 -->  [name] Ramsay, MD  [facility] Pager [phone]  If 7PM-7AM, please contact night-coverage [email or web address] Password [facility] 09/22/2015, 3:23 PM   LOS: 2 days   HPI/Subjective: No events overnight. Lethargic this afternoon, difficult to arouse   Objective: Filed Vitals:   09/21/15 1500 09/21/15 2205 09/22/15 0329 09/22/15 0541  BP: 137/90 134/79 134/77 124/93  Pulse: 74 154 86 94  Temp: 98.3 F (36.8 C) 97.9 F (36.6 C) 97.9 F (36.6 C) 99 F (37.2 C)  TempSrc: Oral Axillary Axillary Axillary  Resp: 20 23 20 23   Weight:      SpO2: 94% 90% 93% 95%    Intake/Output Summary (Last 24 hours) at 09/22/15 1523 Last data filed at 09/22/15 0545  Gross per 24 hour  Intake      0 ml  Output    250 ml  Net   -250 ml    Exam:   General:  Pt is lethargic   Cardiovascular: Irregular rhythm, tachycardic, no rubs, no gallops  Respiratory: bibasilar crackles with diminished breath sounds bilaterally   Abdomen: Soft, non tender, non distended, bowel sounds present, no guarding  Data Reviewed: Basic Metabolic Panel:  Recent Labs Lab 10/09/15 1055 09/20/15 0522 09/21/15 0605 09/22/15 0537  NA 141 141 143 141  K 4.2 4.6 5.2* 5.9*  CL 108 113* 111 110  CO2 25 19* 21* 17*  GLUCOSE 170*  219* 327* 314*  BUN 26* 28* 52* 72*  CREATININE 1.20* 1.28* 1.90* 2.42*  CALCIUM 8.7* 8.3* 8.7* 8.5*   Liver Function Tests:  Recent Labs Lab 10/09/15 1055  AST 20  ALT 10*  ALKPHOS 69  BILITOT 1.1  PROT 7.5  ALBUMIN 2.7*   CBC:  Recent Labs Lab 10-09-2015 1055 10/09/2015 1525 09/20/15 0522 09/21/15 0605 09/22/15 0537 09/22/15 1429  WBC 14.0* 11.7* 9.6 10.9* 17.0* 13.4*  NEUTROABS 11.5* 9.3*  --   --   --  10.7*  HGB 10.3* 10.6* 10.4* 9.8* 11.2* 11.5*  HCT 34.3* 34.8* 34.3* 31.8* 35.3* 38.9  MCV 89.6 88.1 87.9 87.1 87.2 88.8  PLT 463* 326 371 462* 470* 474*    Urine culture     Status: None   Collection Time: Oct 09, 2015  4:06 PM  Result Value Ref Range Status  Specimen Description URINE, CATHETERIZED  Final   Special Requests NONE  Final   Culture   Final    >=100,000 COLONIES/mL ESCHERICHIA COLI Performed at [facility]    Report Status 09/21/2015 FINAL  Final   Organism ID, Bacteria ESCHERICHIA COLI  Final      Susceptibility   Escherichia coli - MIC*    AMPICILLIN >=32 RESISTANT Resistant     CEFAZOLIN >=64 RESISTANT Resistant     CEFTRIAXONE <=1 SENSITIVE Sensitive     CIPROFLOXACIN >=4 RESISTANT Resistant     GENTAMICIN >=16 RESISTANT Resistant     IMIPENEM <=0.25 SENSITIVE Sensitive     NITROFURANTOIN 32 SENSITIVE Sensitive     TRIMETH/SULFA <=20 SENSITIVE Sensitive     AMPICILLIN/SULBACTAM >=32 RESISTANT Resistant     PIP/TAZO >=128 RESISTANT Resistant     * >=100,000 COLONIES/mL ESCHERICHIA COLI   Scheduled Meds: . aspirin EC  81 mg Oral Daily  . cefTRIAXone IV  1 g Intravenous Q24H  . heparin subcutaneous  5,000 Units Subcutaneous 3 times per day  . lactobacillus  1 g Oral TID WC  . nortriptyline  50 mg Oral QHS  . oxybutynin  10 mg Oral Daily  . [START ON 09/23/2015] predniSONE  30 mg Oral Q breakfast  . QUEtiapine  25 mg Oral QHS  . sodium polystyrene  30 g Oral Once  . vancomycin  125 mg Oral 4 times per day

## 2015-09-22 NOTE — Progress Notes (Signed)
eLink Physician-Brief Progress Note Patient Name: [name] DOB: December 11, 1929 MRN: [id number]   Date of Service  09/22/2015  HPI/Events of Note  Worsening hypotension today Mental status poor tachypneic Limited IV access Family not interested in central line  eICU Interventions  I had a long conversation with her granddaughter and explained that it is likely nothing we do will help, but to add a blood pressure medicine would be relatively painless.  Will start neosynephrine, titrated to 252mcg max IV.  However if no improvement in BP or overall condition then will move towards comfort measures.     Intervention Category Major Interventions: End of life / care limitation discussion  Simonne Maffucci 09/22/2015, 8:42 PM

## 2015-09-22 NOTE — Progress Notes (Signed)
Daily Progress Note   Patient Name: [name]       Date: 09/22/2015 DOB: July 26, 1930  Age: 81 y.o. MRN#: [id number] Attending Physician: Theodis Blaze, MD Primary Care Physician: [name], MD Admit Date: 09/19/2015  Reason for Consultation/Follow-up: Establishing goals of care  Subjective:  -continued conversation by telephone  with Chastity/grand-daughter/main caregiver  to discuss diagnosis (specific to dementia/failure to thrive), prognosis, GOC, EOL wishes disposition and options.  A discussion was had today regarding advanced directives.  Concepts specific to code status, artifical feeding and hydration, continued IV antibiotics and rehospitalization was had.  The difference between a aggressive medical intervention path  and a palliative comfort care path for this patient at this time was had.  Values and goals of care important to patient and family were attempted to be elicited.  Concept of Hospice and Palliative Care were discussed  Natural trajectory and expectations at EOL were discussed.  Questions and concerns addressed. Family encouraged to call with questions or concerns.  PMT will continue to support holistically.   Length of Stay: 2 days  Current Medications: Scheduled Meds:  . aspirin EC  81 mg Oral Daily  . cefTRIAXone (ROCEPHIN)  IV  1 g Intravenous Q24H  . enoxaparin (LOVENOX) injection  30 mg Subcutaneous Q24H  . feeding supplement (ENSURE ENLIVE)  237 mL Oral BID BM  . gabapentin  100 mg Oral BID  . lactobacillus  1 g Oral TID WC  . nortriptyline  50 mg Oral QHS  . oxybutynin  10 mg Oral Daily  . predniSONE  40 mg Oral Q breakfast  . QUEtiapine  25 mg Oral QHS  . vancomycin  125 mg Oral 4 times per day    Continuous Infusions:    PRN  Meds: ipratropium-albuterol, LORazepam, metoprolol, nitroGLYCERIN, oxyCODONE, prochlorperazine  Physical Exam: Physical Exam  Constitutional: She appears lethargic. She appears ill.  + temporal muscle wasting  Cardiovascular: Tachycardia present.   Pulmonary/Chest: She has decreased breath sounds in the right lower field and the left lower field.  Abdominal: Soft. Normal appearance.  Musculoskeletal:       Right shoulder: She exhibits decreased range of motion and decreased strength.  Neurological: She appears lethargic. She displays atrophy.  Skin: Skin is warm and dry.  Vital Signs: BP 124/93 mmHg  Pulse 94  Temp(Src) 99 F (37.2 C) (Axillary)  Resp 23  Wt 77.565 kg (171 lb)  SpO2 95% SpO2: SpO2: 95 % O2 Device: O2 Device: Not Delivered O2 Flow Rate:    Intake/output summary:  Intake/Output Summary (Last 24 hours) at 09/22/15 1229 Last data filed at 09/22/15 0545  Gross per 24 hour  Intake      0 ml  Output    250 ml  Net   -250 ml   LBM:   Baseline Weight: Weight: 77.565 kg (171 lb) Most recent weight: Weight: 77.565 kg (171 lb)       Palliative Assessment/Data: Flowsheet Rows        Most Recent Value   Intake Tab    Referral Department  Hospitalist   Unit at Time of Referral  Med/Surg Unit   Palliative Care Primary Diagnosis  Sepsis/Infectious Disease   Palliative Care Type  New Palliative care   Reason for referral  Clarify Goals of Care, Counsel Regarding Hospice   Date first seen by Palliative Care  09/21/15   Clinical Assessment    Palliative Performance Scale Score  30%   Pain Max last 24 hours  4   Pain Min Last 24 hours  3   Dyspnea Max Last 24 Hours  4   Dyspnea Min Last 24 hours  3   Psychosocial & Spiritual Assessment    Palliative Care Outcomes       Additional Data Reviewed: CBC    Component Value Date/Time   WBC 17.0* 09/22/2015 0537   RBC 4.05 09/22/2015 0537   RBC 4.07 06/17/2014 0633   HGB 11.2* 09/22/2015 0537    HCT 35.3* 09/22/2015 0537   PLT 470* 09/22/2015 0537   MCV 87.2 09/22/2015 0537   MCH 27.7 09/22/2015 0537   MCHC 31.7 09/22/2015 0537   RDW 16.4* 09/22/2015 0537   LYMPHSABS 1.5 09/19/2015 1525   MONOABS 0.7 09/19/2015 1525   EOSABS 0.1 09/19/2015 1525   BASOSABS 0.0 09/19/2015 1525    CMP     Component Value Date/Time   NA 141 09/22/2015 0537   K 5.9* 09/22/2015 0537   CL 110 09/22/2015 0537   CO2 17* 09/22/2015 0537   GLUCOSE 314* 09/22/2015 0537   BUN 72* 09/22/2015 0537   CREATININE 2.42* 09/22/2015 0537   CALCIUM 8.5* 09/22/2015 0537   PROT 7.5 09/19/2015 1055   ALBUMIN 2.7* 09/19/2015 1055   AST 20 09/19/2015 1055   ALT 10* 09/19/2015 1055   ALKPHOS 69 09/19/2015 1055   BILITOT 1.1 09/19/2015 1055   GFRNONAA 17* 09/22/2015 0537   GFRAA 20* 09/22/2015 0537       Problem List:  Patient Active Problem List   Diagnosis Date Noted  . Encounter for palliative care   . Goals of care, counseling/discussion   . UTI (lower urinary tract infection) 09/20/2015  . Sepsis due to Pseudomonas UTI ([place]) 09/19/2015  . Thrombocytosis ([place]) 09/19/2015  . AKI (acute kidney injury) ([place]) 05/07/2015  . UTI (urinary tract infection) due to urinary indwelling catheter ([place]) 04/08/2015  . Acute encephalopathy 02/16/2015  . Dementia 02/16/2015  . H/O: CVA (cerebrovascular accident)- with R Hemiparesis/contractures 02/16/2015  . DNR (do not resuscitate) 02/16/2015  . Adenocarcinoma, colon ([place]) 09/09/2013  . Paroxysmal atrial fibrillation ([facility]) 09/07/2013     Palliative Care Assessment & Plan    Code Status:  DNR    Code Status Orders  Start     Ordered   09/19/15 1422  Do not attempt resuscitation (DNR)   Continuous    Question Answer Comment  In the event of cardiac or respiratory ARREST Do not call a "code blue"   In the event of cardiac or respiratory ARREST Do not perform Intubation, CPR, defibrillation or ACLS   In the event of cardiac or respiratory  ARREST Use medication by any route, position, wound care, and other measures to relive pain and suffering. May use oxygen, suction and manual treatment of airway obstruction as needed for comfort.      09/19/15 1421        Goals of Care/Additional Recommendations:   Treat the treatable, family is hopeful for improvement.  Reassess in the morning and clarify decisions regarding aggressiveness of care dependant on outcomes of the next 24 hours, if continues decline shift to comfort and take patient home with hospice services    Desire for further Chaplaincy support:no  Psycho-social Needs: Education on Hospice and Grief/Bereavement Support    Palliative Prophylaxis:   Aspiration, Bowel Regimen, Delirium Protocol, Frequent Pain Assessment, Oral Care and Turn Reposition   Prognosis: Less than six months and likely much less without life prolonging measures  . Discharge Planning:  Family is leaning toward home with hospice benefit, need to see if patient will respond to current interventions   Care plan was discussed with [facility] RN/ICU  Thank you for allowing the Palliative Medicine Team to assist in the care of this patient.   Time In: 1215 Time Out: 1250 Total Time 35 min Prolonged Time Billed  no         Knox Royalty, NP  09/22/2015, 12:29 PM  Please contact Palliative Medicine Team phone at [phone] for questions and concerns.

## 2015-09-22 NOTE — Progress Notes (Signed)
[place] Progress Note Patient Name: [name] DOB: Feb 22, 1930 MRN: [id number]   Date of Service  09/22/2015  HPI/Events of Note  Tachycardia, appears irregular, possibly Afib with RVR but rate > 170 No evidence of WPW type finding on baseline EKG History of Afib Fever  eICU Interventions  Metoprolol now APAP now     Intervention Category Major Interventions: Arrhythmia - evaluation and management Intermediate Interventions: Infection - evaluation and management  Simonne Maffucci 09/22/2015, 5:33 PM

## 2015-09-22 NOTE — Progress Notes (Signed)
Pt arrived to room 1231 at 1700 from [place] was found to be somnelent and unresponsive upon arrival.  Pt heart rate was irregular in the 170s and febrile at 102.9.  Ekg showed afib RVR.  Rectal tylenol and and 5mg  IV metoprolol administered. Elink and Dr. Lake Bells made aware of pt situation.   Irven [place], RN

## 2015-09-22 NOTE — Progress Notes (Signed)
[place] Progress Note Patient Name: KENTLEE BIBEAU DOB: [birth year] MRN: [id number]   Date of Service  09/22/2015  HPI/Events of Note  Multiple problems: Afib with RVR continues despite metoprolol Lactic acid up a bit (though in renal failure) Hyperkalemia on BMET  eICU Interventions  Afib> add amiodarone Lactic acid> give IVF, control Afib, repeat lactic acid Hyperkalemia> d50, insulin, calcium, cannot take po, avoid albuterol with AFIB with RVR, repeat BMET     Intervention Category Major Interventions: Arrhythmia - evaluation and management;Electrolyte abnormality - evaluation and management;Infection - evaluation and management  Simonne Maffucci 09/22/2015, 6:39 PM

## 2015-09-22 NOTE — Progress Notes (Signed)
Pt to be transferred to step down per MD.RN reported to ICU nurse.

## 2015-09-22 NOTE — Progress Notes (Signed)
CRITICAL VALUE ALERT  Critical value received:  Potassium 6.2  Date of notification:  09/22/2015  Time of notification:  [id number]  Critical value read back:Yes.    Nurse who received alert:  Jeannie Fend RN  MD notified (1st page):  Dr. Lake Bells  Time of first page:  1840  MD notified (2nd page): Dr. Lake Bells  Time of second page:  Responding MD:  Dr. Lake Bells  Time MD responded:  [phone]

## 2015-09-22 NOTE — Progress Notes (Signed)
CRITICAL VALUE ALERT  Critical value received:  Lactic Acid 3.8  Date of notification:  09/22/2015  Time of notification:  [id number]  Critical value read back:Yes.    Nurse who received alert:  Jeannie Fend RN  MD notified (1st page):  Dr. Lake Bells  Time of first page:  1840  MD notified (2nd page):  Time of second page:  Responding MD:  Dr. Waunita Schooner  Time MD responded:  [phone]

## 2015-09-22 NOTE — Consult Note (Addendum)
PULMONARY / CRITICAL CARE MEDICINE   Name: [name] MRN: [id number] DOB: [birth year]    ADMISSION DATE:  09/19/2015 CONSULTATION DATE:  09/22/2015  REFERRING MD:  Mart Piggs, M.D./Hospitalist  CHIEF COMPLAINT:  Altered Mental Status  STUDIES:  TTE (09/06/13):  Mild LVH w/ EF 55-60%. No wall motion abnormality. LA & RA normal in size. RV normal in size & function. Trivial aortic regurg without stenosis. No mitral stenosis or regurg. CT Chest W/O 12/26:  Bilateral pleural effusions right greater than left with some compressive atelectasis. No obvious air bronchograms to suggest consolidation or other opacity.   CULTURES: Blood Ctx x2 12/26>>> Blood Ctx x2 12/23>>> Urine Ctx 12/23:  E coli   Urine Ctx 05/04/15:  Pseudomonas & Enterococcus Urine Ctx 04/08/15:  Pseudomonas Urine Ctx 02/16/15:  E coli  ANTIBIOTICS: Imipenem 12/26>>> Vancomycin 12/23; 12/26>>> Ceftriaxone 12/23 & 12/26  Zosyn 12/26 Cefepime 12/23 - 12/25 Gentamycin 12/23  SIGNIFICANT EVENTS: 12/23 - Admit 12/26 - Transfer to SDU  LINES/TUBES: Foley Catheter 12/23>>> PIV x1 Colostomy  HISTORY OF PRESENT ILLNESS:  81 year old female with known history of stroke and chronic right-sided hemiparesis with chronic indwelling Foley catheter. Presented to Hospital after her home care physician recommended evaluation in the local emergency department. Patient recently was found to have a Pseudomonas urinary tract infection and was empirically given a dose of gentamicin in the emergency department. Reportedly the patient was somewhat altered yesterday and progressively became more lethargic today with worsening acute renal failure. Remainder of the history was obtained from the patient's electronic medical record given altered mentation.  PAST MEDICAL HISTORY :  Past Medical History  Diagnosis Date  . COPD (chronic obstructive pulmonary disease) ([place])   . CHF (congestive heart failure) ([place])     ECHO 2011  EF:  60% to [id number], grd 1 distolic dysfxn, [id number] ECHO, tech limited by Afib  . Hypertension   . Stroke [facility]) 2008    rt side deficit  . A-fib ([place])   . Back pain   . Constipation   . SBO (small bowel obstruction) ([place])     per GI note in 2011, pt denies  . Upper GI bleed 2011    required hospitalization  . Colon polyps   . Tobacco abuse   . Diabetes mellitus     per daughter doctor took patient off DM medication 6 months ago   . Cancer ([place])   . Colon cancer ([place])   . Cirrhosis of liver ([place])     PAST SURGICAL HISTORY: Past Surgical History  Procedure Laterality Date  . Total abdominal hysterectomy  1980  . Colonoscopy N/A 09/04/2013    Procedure: COLONOSCOPY;  Surgeon: Lear Ng, MD;  Location: [facility] ENDOSCOPY;  Service: Endoscopy;  Laterality: N/A;  . Colon resection N/A 09/06/2013    Procedure: PARTIAL COLECTOMY WITH COLOSTOMY;  Surgeon: Earnstine Regal, MD;  Location: [facility];  Service: General;  Laterality: N/A;  . Colostomy revision N/A 09/08/2013    Procedure: COLOSTOMY REVISION;  Surgeon: Gwenyth Ober, MD;  Location: [place];  Service: General;  Laterality: N/A;  . Colostomy       Allergies  Allergen Reactions  . Aggrenox [Aspirin-Dipyridamole Er] Diarrhea and Nausea And Vomiting    & abdominal bleeding    No current facility-administered medications on file prior to encounter.   Current Outpatient Prescriptions on File Prior to Encounter  Medication Sig  . gabapentin (NEURONTIN) 100 MG capsule Take 2 capsules (200 mg total)  by mouth 3 (three) times daily.  . Naproxen Sodium (ALEVE) 220 MG CAPS Take 220 mg by mouth every 12 (twelve) hours as needed (pain).  . QUEtiapine (SEROQUEL) 25 MG tablet Take 37.5 mg by mouth at bedtime.   . traMADol (ULTRAM) 50 MG tablet Take 1 tablet (50 mg total) by mouth every 6 (six) hours as needed. pain  . nitroGLYCERIN (NITROSTAT) 0.4 MG SL tablet Place 0.4 mg under the tongue every 5 (five) minutes as needed for chest pain.  Reported on 09/19/2015    FAMILY HISTORY:  Family History  Problem Relation Age of Onset  . Lung cancer Mother   . Heart disease Father     SOCIAL HISTORY: Social History  Substance Use Topics  . Smoking status: Current Some Day Smoker -- 0.50 packs/day for 15 years    Types: Cigarettes  . Smokeless tobacco: Never Used  . Alcohol Use: No    REVIEW OF SYSTEMS:  Unable to obtain given altered mentation.  SUBJECTIVE:   VITAL SIGNS: BP 141/80 mmHg  Pulse 180  Temp(Src) 102 F (38.9 C) (Axillary)  Resp 23  Wt 171 lb (77.565 kg)  SpO2 95%  HEMODYNAMICS:    VENTILATOR SETTINGS:    INTAKE / OUTPUT: I/O last 3 completed shifts: In: -  Out: 400 [Urine:400]  PHYSICAL EXAMINATION: General:  No acute distress. Laying eyes closed in bed in the dark. No family at bedside. Integument:  Warm & dry. No rash on exposed skin.  Lymphatics:  No appreciated cervical or supraclavicular lymphadenoapthy. HEENT:  Tacky mucus membranes. No scleral injection or icterus.  Cardiovascular:  Regular rhythm. No edema. Unable to appreciate JVD. Pulmonary:  Slightly diminished breath sounds in bases bilaterally. Normal work of breathing on room air. Abdomen: Soft. Normal bowel sounds. Ostomy in place. Tender to palpation suprapubic. Musculoskeletal:  No obvious joint effusion. Unable to completely assess given altered mentation. Neurological:  Not following commands. Not answering questions. Patient is mumbling with an attempt to answer questions. Psychiatric:  Unable to assess given altered mentation.   LABS:  BMET  Recent Labs Lab 09/20/15 0522 09/21/15 0605 09/22/15 0537  NA 141 143 141  K 4.6 5.2* 5.9*  CL 113* 111 110  CO2 19* 21* 17*  BUN 28* 52* 72*  CREATININE 1.28* 1.90* 2.42*  GLUCOSE 219* 327* 314*    Electrolytes  Recent Labs Lab 09/20/15 0522 09/21/15 0605 09/22/15 0537  CALCIUM 8.3* 8.7* 8.5*    CBC  Recent Labs Lab 09/21/15 0605 09/22/15 0537  09/22/15 1429  WBC 10.9* 17.0* 13.4*  HGB 9.8* 11.2* 11.5*  HCT 31.8* 35.3* 38.9  PLT 462* 470* 474*    Coag's  Recent Labs Lab 09/19/15 1525 09/22/15 1429  APTT 35 29  INR 1.34 2.03*    Sepsis Markers  Recent Labs Lab 09/19/15 1525 09/19/15 1528 09/19/15 2016 09/22/15 1405  LATICACIDVEN  --  1.5 1.8 3.5*  PROCALCITON 0.11  --   --   --     ABG No results for input(s): PHART, PCO2ART, PO2ART in the last 168 hours.  Liver Enzymes  Recent Labs Lab 09/19/15 1055  AST 20  ALT 10*  ALKPHOS 69  BILITOT 1.1  ALBUMIN 2.7*    Cardiac Enzymes No results for input(s): TROPONINI, PROBNP in the last 168 hours.  Glucose No results for input(s): GLUCAP in the last 168 hours.  Imaging Ct Chest Wo Contrast  09/22/2015  CLINICAL DATA:  Dyspnea.  Sepsis and pneumonia. EXAM: CT CHEST  WITHOUT CONTRAST TECHNIQUE: Multidetector CT imaging of the chest was performed following the standard protocol without IV contrast. COMPARISON:  One-view chest x-ray a 09/19/2015 FINDINGS: Mediastinum/Lymph Nodes: Heart is mildly enlarged. Coronary artery calcifications are present. Extensive atherosclerotic calcifications are present at the arch and origins of the great vessels. There is no significant mediastinal or axillary adenopathy. A dominant 15 mm nodule is present in the left lobe of the thyroid. Lungs/Pleura: Bilateral pleural effusions are present. There is partial collapse of the lower lobes bilaterally. The upper lung fields are clear. Mild associated atelectasis is present. No significant airspace consolidation is present. Upper abdomen: Calcifications are present within the spleen. The liver is unremarkable. Significant vascular calcifications are present. Musculoskeletal: Vertebral body heights alignment are maintained. There is mild exaggerated kyphosis. IMPRESSION: 1. Moderate bilateral pleural effusions. 2. Partial collapse of the lower lobes bilaterally without significant airspace  consolidation to suggest pneumonia. 3. Mild cardiomegaly. 4. Extensive atherosclerotic disease including coronary artery disease. 5. 15 mm left thyroid nodule Consider further evaluation with thyroid ultrasound. If patient is clinically hyperthyroid, consider nuclear medicine thyroid uptake and scan. Electronically Signed   By: San Morelle M.D.   On: 09/22/2015 15:08    ASSESSMENT / PLAN:  PULMONARY A: Bilateral Pleural Effusions - Likely secondary to Diastolic CHF.  P:   No role for thoracentesis. Monitor saturation with continuous pulse ox.  CARDIOVASCULAR A:  H/O Grade 1 Diastolic CHF (TTE [id number]) H/O Atrial fibrillation H/O HTN  P:  Monitor on telemetry D/C ASA Lopressor IV prn HR >115  RENAL A:   Acute Renal Failure - Worsening. Hyperkalemia - Worsening. Lactic Acidosis  P:   Trending Renal Function with daily BUN/Creatinine Monitoring UOP with foley catheter Trending Lactic Acid q3hr Renal U/S Pending NS 50cc/hr Repeat BMP @ 11pm today to monitor Hyperkalemia EKG now D/C Kayexalate given NPO status  GASTROINTESTINAL A:   Abdominal Pain H/O Liver Cirrhosis  P:   NPO CT Scan Abdomen/Pelvis W/O Stat Checking Hepatic Function Panel  HEMATOLOGIC A:   Anemia - Mild. No signs of active bleeding. Leukocytosis - Likely secondary to sepsis.  P:  Monitor cell counts daily with CBC Heparin [place] q8hr SCDs  INFECTIOUS A:   Sepsis - Likely UTI from chronic foley. UTI - E coli on Ctx from 12/23.  P:   Currently on Day #1 Vancomycin & Imipenem  Awaiting finalization of Blood Cultures Procalcitonin pending Repeat Urine Culture  ENDOCRINE A:   DM-2  P:   Accu-Checks q4hr Low dose SSI per algorithm  NEUROLOGIC A:   Altered Mental Status - Delirium vs Toxic Metabolic Encephalopathy H/O Stroke Chronic Back Pain  P:   RASS goal: 0 Avoiding sedating medications Checking Ammonia Level D/C Ativan, Pamelor, Oxycodone, Compazine, &  Seroquel.  FAMILY  - Updates:  Grand-daughter updated via phone by Dr. Ashok Cordia 12/26.  TODAY'S SUMMARY:  80 year old female with baseline liver cirrhosis. Patient with worsening altered mentation in the setting of Ativan and multiple other medications that could be mind altering. Certainly with her new Lactic acidosis and fever we could be looking at a worsening of her underlying sepsis likely from her urinary tract infection given abdominal pain on physical exam. I am investigating her abdominal pain with a CT scan of her abdomen without contrast. I have updated her granddaughter regarding her worsening clinical status. She reports she would potentially worse to take her home so that she did not die in the hospital if it came to that.  I have spent a total of 37 minutes of critical care time today caring for the patient and updating the patient's granddaughter as well as reviewing her electronic medical record.  Sonia Baller Ashok Cordia, M.D. [facility] Pulmonary & Critical Care Pager:  [phone] After 3pm or if no response, call [phone] 09/22/2015, 3:49 PM

## 2015-09-22 NOTE — Progress Notes (Signed)
RN Notified MD of Pt's condition and Lab results. Will continue to monitor.

## 2015-09-22 NOTE — Progress Notes (Addendum)
ANTIBIOTIC CONSULT NOTE - INITIAL  Pharmacy Consult for Primaxin/Vancomycin Indication: Sepsis  Allergies  Allergen Reactions  . Aggrenox [Aspirin-Dipyridamole Er] Diarrhea and Nausea And Vomiting    & abdominal bleeding    Patient Measurements: Weight: 171 lb (77.565 kg)   Vital Signs: Temp: 102 F (38.9 C) (12/26 1517) Temp Source: Axillary (12/26 1517) BP: 141/80 mmHg (12/26 1517) Pulse Rate: 180 (12/26 1517) Intake/Output from previous day: 12/25 0701 - 12/26 0700 In: -  Out: 250 [Urine:250] Intake/Output from this shift:    Labs:  Recent Labs  09/20/15 0522 09/21/15 0605 09/22/15 0537 09/22/15 1429  WBC 9.6 10.9* 17.0* 13.4*  HGB 10.4* 9.8* 11.2* 11.5*  PLT 371 462* 470* 474*  CREATININE 1.28* 1.90* 2.42*  --    Estimated Creatinine Clearance: 17.4 mL/min (by C-G formula based on Cr of 2.42). No results for input(s): VANCOTROUGH, VANCOPEAK, VANCORANDOM, GENTTROUGH, GENTPEAK, GENTRANDOM, TOBRATROUGH, TOBRAPEAK, TOBRARND, AMIKACINPEAK, AMIKACINTROU, AMIKACIN in the last 72 hours.   Microbiology: Recent Results (from the past 720 hour(s))  Culture, blood (x 2)     Status: None (Preliminary result)   Collection Time: 09/19/15  3:20 PM  Result Value Ref Range Status   Specimen Description BLOOD LEFT ARM  Final   Special Requests IN PEDIATRIC BOTTLE [place]  Final   Culture   Final    NO GROWTH 3 DAYS Performed at [facility]    Report Status PENDING  Incomplete  Culture, blood (x 2)     Status: None (Preliminary result)   Collection Time: 09/19/15  3:25 PM  Result Value Ref Range Status   Specimen Description BLOOD LEFT HAND  Final   Special Requests IN PEDIATRIC BOTTLE 3CC  Final   Culture   Final    NO GROWTH 3 DAYS Performed at [facility]    Report Status PENDING  Incomplete  Urine culture     Status: None   Collection Time: 09/19/15  4:06 PM  Result Value Ref Range Status   Specimen Description URINE, CATHETERIZED  Final   Special  Requests NONE  Final   Culture   Final    >=100,000 COLONIES/mL ESCHERICHIA COLI Performed at [facility]    Report Status 09/21/2015 FINAL  Final   Organism ID, Bacteria ESCHERICHIA COLI  Final      Susceptibility   Escherichia coli - MIC*    AMPICILLIN >=32 RESISTANT Resistant     CEFAZOLIN >=64 RESISTANT Resistant     CEFTRIAXONE <=1 SENSITIVE Sensitive     CIPROFLOXACIN >=4 RESISTANT Resistant     GENTAMICIN >=16 RESISTANT Resistant     IMIPENEM <=0.25 SENSITIVE Sensitive     NITROFURANTOIN 32 SENSITIVE Sensitive     TRIMETH/SULFA <=20 SENSITIVE Sensitive     AMPICILLIN/SULBACTAM >=32 RESISTANT Resistant     PIP/TAZO >=128 RESISTANT Resistant     * >=100,000 COLONIES/mL ESCHERICHIA COLI    Medical History: Past Medical History  Diagnosis Date  . COPD (chronic obstructive pulmonary disease) ([place])   . CHF (congestive heart failure) ([place])     ECHO 2011  EF: 60% to [id number], grd 1 distolic dysfxn, [id number] ECHO, tech limited by Afib  . Hypertension   . Stroke [facility]) 2008    rt side deficit  . A-fib ([place])   . Back pain   . Constipation   . SBO (small bowel obstruction) ([place])     per GI note in 2011, pt denies  . Upper GI bleed 2011  required hospitalization  . Colon polyps   . Tobacco abuse   . Diabetes mellitus     per daughter doctor took patient off DM medication 6 months ago   . Cancer ([place])   . Colon cancer ([place])   . Cirrhosis of liver (HCC)     Medications:  Prescriptions prior to admission  Medication Sig Dispense Refill Last Dose  . acetaminophen (TYLENOL) 325 MG tablet Take 650 mg by mouth every 6 (six) hours as needed for moderate pain or headache.   unknown  . baclofen (LIORESAL) 10 MG tablet Take 10 mg by mouth 3 (three) times daily.   09/18/2015 at Unknown time  . ciprofloxacin (CIPRO) 250 MG tablet Take 250 mg by mouth 2 (two) times daily.   09/18/2015 at Unknown time  . gabapentin (NEURONTIN) 100 MG capsule Take 2 capsules (200 mg total) by  mouth 3 (three) times daily.   09/18/2015 at Unknown time  . lisinopril (PRINIVIL,ZESTRIL) 2.5 MG tablet Take 2.5 mg by mouth daily.   09/18/2015 at Unknown time  . Melaton-Thean-Cham-PassF-LBalm (SLEEP PO) Take 1 tablet by mouth at bedtime as needed (sleep). Restful Sleep, without benadryl (family history of benadryl allergy)   09/18/2015 at Unknown time  . Naproxen Sodium (ALEVE) 220 MG CAPS Take 220 mg by mouth every 12 (twelve) hours as needed (pain).   unknown  . nortriptyline (PAMELOR) 25 MG capsule Take 50 mg by mouth at bedtime.   09/18/2015 at Unknown time  . Phenazopyridine HCl (VH ESSENTIALS UTI RELIEF PO) Take 1 tablet by mouth daily. All Natural Cranberry   09/18/2015 at Unknown time  . QUEtiapine (SEROQUEL) 25 MG tablet Take 37.5 mg by mouth at bedtime.    09/18/2015 at Unknown time  . traMADol (ULTRAM) 50 MG tablet Take 1 tablet (50 mg total) by mouth every 6 (six) hours as needed. pain 30 tablet 0 09/18/2015 at Unknown time  . nitroGLYCERIN (NITROSTAT) 0.4 MG SL tablet Place 0.4 mg under the tongue every 5 (five) minutes as needed for chest pain. Reported on 09/19/2015   Not Taking at Unknown time  . oxybutynin (DITROPAN-XL) 10 MG 24 hr tablet Take 10 mg by mouth daily. Reported on 09/19/2015   Not Taking at Unknown time  . [DISCONTINUED] aspirin EC 81 MG EC tablet Take 1 tablet (81 mg total) by mouth daily. (Patient not taking: Reported on 09/19/2015)   Not Taking at Unknown time  . [DISCONTINUED] gabapentin (NEURONTIN) 400 MG capsule Take 400 mg by mouth 3 (three) times daily.     . [DISCONTINUED] lactobacillus (FLORANEX/LACTINEX) PACK Take 1 packet (1 g total) by mouth 3 (three) times daily with meals. (Patient not taking: Reported on 09/19/2015) 60 packet 0 Not Taking at Unknown time   Scheduled:  . aspirin EC  81 mg Oral Daily  . feeding supplement (ENSURE ENLIVE)  237 mL Oral BID BM  . heparin subcutaneous  5,000 Units Subcutaneous 3 times per day  . imipenem-cilastatin  250  mg Intravenous Q12H  . lactobacillus  1 g Oral TID WC  . nortriptyline  50 mg Oral QHS  . oxybutynin  10 mg Oral Daily  . [START ON 09/23/2015] predniSONE  30 mg Oral Q breakfast  . QUEtiapine  25 mg Oral QHS  . sodium polystyrene  30 g Oral Once  . vancomycin  125 mg Oral 4 times per day  . vancomycin  1,000 mg Intravenous Q48H   Infusions:   Assessment: 85 yoF on rocephin for  UTI now with increased dyspnea and chest ct suggestive of PNA. Primaxin and Vancomycin per Rx for Sepsis.   Goal of Therapy:  Vancomycin trough level 15-20 mcg/ml  Plan:   Primaxin 250mg  IV q12h  Vancomycin 1Gm IV q48h  F/u SCr/cultures/levels as needed  Lawana Pai R 09/22/2015,3:51 PM

## 2015-09-23 DIAGNOSIS — R1084 Generalized abdominal pain: Secondary | ICD-10-CM

## 2015-09-23 LAB — URINE CULTURE: CULTURE: NO GROWTH

## 2015-09-23 LAB — GLUCOSE, CAPILLARY: GLUCOSE-CAPILLARY: 290 mg/dL — AB (ref 65–99)

## 2015-09-23 LAB — LACTIC ACID, PLASMA: LACTIC ACID, VENOUS: 3.8 mmol/L — AB (ref 0.5–2.0)

## 2015-09-23 MED ORDER — MORPHINE SULFATE (CONCENTRATE) 20 MG/ML PO SOLN: 20.0000 mg | ORAL | Status: AC | PRN

## 2015-09-23 MED ORDER — MORPHINE SULFATE (PF) 2 MG/ML IV SOLN: 1.0000 mg | INTRAVENOUS | Status: DC | PRN

## 2015-09-23 MED ORDER — LORAZEPAM 2 MG/ML IJ SOLN: 0.5000 mg | INTRAMUSCULAR | Status: DC | PRN

## 2015-09-23 MED ORDER — HYOSCYAMINE SULFATE 0.125 MG/ML PO SOLN: 0.1250 mg | ORAL | Status: DC | PRN

## 2015-09-23 MED ORDER — SCOPOLAMINE 1 MG/3DAYS TD PT72
1.0000 | MEDICATED_PATCH | TRANSDERMAL | Status: DC
  Administered 2015-09-23: 1.5 mg via TRANSDERMAL
  Filled 2015-09-23: qty 1

## 2015-09-23 MED ORDER — DEXTROSE 5 % IV SOLN
30.0000 ug/min | INTRAVENOUS | Status: DC
  Administered 2015-09-23: 150 ug/min via INTRAVENOUS
  Filled 2015-09-23: qty 4

## 2015-09-23 MED ORDER — MORPHINE SULFATE (PF) 2 MG/ML IV SOLN
1.0000 mg | INTRAVENOUS | Status: DC | PRN
  Administered 2015-09-23: 1 mg via INTRAVENOUS
  Filled 2015-09-23: qty 1

## 2015-09-23 NOTE — Progress Notes (Signed)
Date: September 23, 2015 Chart reviewed for concurrent status and case management needs. Will continue to follow patient for changes and needs:  Patient being discharged to [address] in [place], [place] with care through [facility]/patient will be transported via [facility] with o2 and family will meet at the house.  Family states that they all equipment needed except for the 02 and this is being set up asap through [facility].  [facility] is aware of need to transport approx. 12pm today. Velva Harman, RN, BSN, [place]   [phone]

## 2015-09-23 NOTE — Progress Notes (Signed)
Patient ID: [name], female   DOB: [birth year], 81 y.o.   MRN: [id number]  [facility] PROGRESS NOTE  [name] [id number] DOB: 1930/07/19 DOA: 09/19/2015 PCP: [name], MD   Brief narrative:    Patient is 81 year old female with known history of CVA with right-sided hemiparesis, colon cancer status post colostomy bag placement, chronic indwelling catheter and chronic A. fib on no anticoagulation due to high risk of fall. Patient was brought to emergency department apparently from home after primary care physician evaluated the patient and recommended her to go to emergency Department. Please note that in emergency department, patient is confused, unable to provide history, no family at bedside present. Per emergency room doctor report, patient recently had urinary tract infection that was positive for Pseudomonas and sensitive to gentamicin. She was given gentamicin dose in emergency department.  In emergency department, patient is hemodynamically stable, vital signs notable for heart rate in 130s, blood work notable for WBC 14, platelets 463, creatinine 1.2. [facility] asked to admit for further evaluation.   Major events since admission: 12/23 - admitted for sepsis for presumptive UTI and ? LLL PNA, started on maxipime, PCT consulted  12/25 - urine culture with E. Coli multidrug resistance, changed ABX to Rocephin per sensitivity report 12/26 - more lethargic, worsening renal failure, transfer to SDU, PCCM consulted  12/27 - full comfort   Microbiology data: Urine culture 12/23 - E. Coli sensitivity to Rocephin  Blood cultures 12/23 - no growth in 3 days  Urine culture 12/26 - pending  Blood culture 12/26  - pending   Assessment/Plan:    Principal Problem:  Severe sepsis, septic shock due to Pseudomonas, E. Coli UTI ([place]) - given one dose of gentamycin in ED for pseudomonas UTI that was reported by her PCP - please note that pt was initially started on  Maxipime but that was changed once we got urine culture back that was positive for E. Coli and with multidrug resistance - urine culture noted sensitivity to Rocephin and that was started on 12/25  - pt became more lethargic on 12/26 and with Tmax 102 F9 reported per nurse), lactic acid > 3, sepsis protocol re initiated and abx coverage broadened to vanc and Primaxin, was not able to give Zosyn as urine sensitivity report indicated resistance to Zosyn  - lactic acid acid progressively worsening despite medical measures and now c/w septic shock - d/w family at bedside, granddaughter says she knew pt was progressively declining and is ready to allow full comfort as pt previously expressed  - no further blood work to ensure comfort   Active Problems:   Transaminitis - shock liver  - no further blood work, focus on comfort     ? Left lobe PNA - was on maxipime since admission but CT chest done 12/25 not convincing for PNA - bilateral pleural effusions were noted but no clear opacity that is c/w PNA    Acute kidney failure with hyperkalemia and metabolic acidosis  - ? Gentamycin that was given in ED  - renal US with no acute findings, U Na and U Cr pending  - no further blood work in order to ensure comfort    Acute encephalopathy - secondary to the above   UTI (urinary tract infection) due to urinary indwelling catheter ([place]) - ABX as noted above  - E. Coli with MDR and previously with pseudomonas    Thrombocytosis (HCC) - reactive    Paroxysmal atrial fibrillation ([place]) -  CHADS2 4-5, not on AC due to high risk fall, progressive decline, dementia  - monitor on telemetry    Adenocarcinoma, colon (HCC)   Dementia - has been confused since admission  - full comfort care allowed    Recent C. Diff - completed oral vanc recently  - no diarrheas while hospitalized this time    H/O: CVA (cerebrovascular accident)- with R Hemiparesis/contractures - bed bound at home    DNR  (do not resuscitate)  Lovenox SQ stooped to ensure comfort   Code Status: DNR Family Communication: family at bedside  Disposition Plan: full comfort    IV access:  Peripheral IV  Procedures and diagnostic studies:    Dg Chest Port 1 View 2015/09/20 Left much worse than right airspace disease worrisome for pneumonia is or less likely asymmetric edema.   Ct Chest Wo Contrast 09/22/2015  Moderate bilateral pleural effusions. 2. Partial collapse of the lower lobes bilaterally without significant airspace consolidation to suggest pneumonia. 3. Mild cardiomegaly. 4. Extensive atherosclerotic disease including coronary artery disease. 5. 15 mm left thyroid nodule Consider further evaluation with thyroid ultrasound. If patient is clinically hyperthyroid, consider nuclear medicine thyroid uptake and scan.   US Renal 09/22/2015  Mild bilateral renal cortical thinning. No evidence of hydronephrosis.  Medical Consultants:  PCT PCCM   Other Consultants:  PT SLP - Dys III diet  Nutritionist   IAnti-Infectives:   Maxipime 09-20-2023 --> 12/25 Rocephin 12/25  -> 12/26 Vancomycin 12/26 --> 12/27 Primaxin 12/26 --> 12/27  [name], MD  [facility] Pager [phone]  If 7PM-7AM, please contact night-coverage [email or web address] Password [email or web address] 09/23/2015, 8:45 AM   LOS: 3 days   HPI/Subjective: Calm this AM, no concerns per family.  Objective: Filed Vitals:   09/23/15 0300 09/23/15 0400 09/23/15 0500 09/23/15 0600  BP:  53/18 94/37 50/32   Pulse:    88  Temp:      TempSrc:      Resp: 27 26 24 22   Weight:      SpO2:    97%    Intake/Output Summary (Last 24 hours) at 09/23/15 0845 Last data filed at 09/22/15 2200  Gross per 24 hour  Intake 332.25 ml  Output      0 ml  Net 332.25 ml    Exam:   General:  Pt is lethargic, NAD  Cardiovascular: Irregular rhythm, tachycardic, no rubs, no gallops  Respiratory: bibasilar crackles with diminished breath sounds bilaterally   Data  Reviewed: Basic Metabolic Panel:  Recent Labs Lab 09/20/15 0522 09/21/15 0605 09/22/15 0537 09/22/15 1751 09/22/15 2220  NA 141 143 141 141 141  K 4.6 5.2* 5.9* 6.2* 6.3*  CL 113* 111 110 110 111  CO2 19* 21* 17* 17* 16*  GLUCOSE 219* 327* 314* 345* 307*  BUN 28* 52* 72* 83* 86*  CREATININE 1.28* 1.90* 2.42* 2.88* 2.95*  CALCIUM 8.3* 8.7* 8.5* 8.4* 8.0*   Liver Function Tests:  Recent Labs Lab 20-Sep-2015 1055 09/22/15 1751  AST 20 2059*  ALT 10* 1270*  ALKPHOS 69 52  BILITOT 1.1 0.9  PROT 7.5 7.1  ALBUMIN 2.7* 2.8*   CBC:  Recent Labs Lab 20-Sep-2015 1055 09/20/15 1525 09/20/15 0522 09/21/15 0605 09/22/15 0537 09/22/15 1429  WBC 14.0* 11.7* 9.6 10.9* 17.0* 13.4*  NEUTROABS 11.5* 9.3*  --   --   --  10.7*  HGB 10.3* 10.6* 10.4* 9.8* 11.2* 11.5*  HCT 34.3* 34.8* 34.3* 31.8* 35.3* 38.9  MCV 89.6 88.1 87.9 87.1 87.2 88.8  PLT 463* 326 371 462* 470* 474*    Urine culture     Status: None   Collection Time: 09/19/15  4:06 PM  Result Value Ref Range Status   Specimen Description URINE, CATHETERIZED  Final   Special Requests NONE  Final   Culture   Final    >=100,000 COLONIES/mL ESCHERICHIA COLI Performed at [facility]    Report Status 09/21/2015 FINAL  Final   Organism ID, Bacteria ESCHERICHIA COLI  Final      Susceptibility   Escherichia coli - MIC*    AMPICILLIN >=32 RESISTANT Resistant     CEFAZOLIN >=64 RESISTANT Resistant     CEFTRIAXONE <=1 SENSITIVE Sensitive     CIPROFLOXACIN >=4 RESISTANT Resistant     GENTAMICIN >=16 RESISTANT Resistant     IMIPENEM <=0.25 SENSITIVE Sensitive     NITROFURANTOIN 32 SENSITIVE Sensitive     TRIMETH/SULFA <=20 SENSITIVE Sensitive     AMPICILLIN/SULBACTAM >=32 RESISTANT Resistant     PIP/TAZO >=128 RESISTANT Resistant     * >=100,000 COLONIES/mL ESCHERICHIA COLI   . scopolamine  1 patch Transdermal Q72H

## 2015-09-23 NOTE — Progress Notes (Addendum)
Shift event: BP has been soft and PCCM started pt on pressors last pm. Family did not want a central line and pt has no access other than a small PIV. Pt has hyperkalemia and unable to treat IV due to pressors and just the one IV line. (IV team and RNs have tried). Family declined kayex PR also. This NP was reviewing vitals in chart and BP noted to be even lower. NP to bedside. NP spoke to pt's daughter and granddaughter Education officer, community). Explained that BP is not responding to the pressors as we would have liked. Explained organ failure and the affect that continued low BPs have on organs and pt's prognosis. Explained that pt is in multisystem organ failure and this can not likely be reversed especially given poor response to the pressors. Explained what is involved in "comfort care". At this time, we all agreed to continue to titrate pressors over next 1-2 hours to see if response. If not, will go to full comfort care.  Family agrees that pt does not appear to be in pain or distress at this time. Will follow. Clance Boll, NP [facility] Update: RN contacted NP because pt's family had decided to transition to full comfort care. Neo has been titrated over 161mcg/hr and BP still in the 70s. This NP spoke with granddaughter on telephone. She confirms full comfort care including stopping all meds except those for comfort, labs, tests, etc. See orders.  KJKG, NP [facility]

## 2015-09-23 NOTE — Progress Notes (Signed)
PCCM Attending Note  Note from Dr. Doyle Askew reviewed this morning. Patient's family wish to proceed with full comfort care at this time. I will sign off. Please let me know if I can be of any further assistance in the care of this patient.  Sonia Baller Ashok Cordia, M.D. [facility] Pager:  [phone] After 3pm or if no response, call [phone]

## 2015-09-23 NOTE — Progress Notes (Signed)
Notified by [facility] of pt./family request for [facility] at home after discharge. Chart and patient information are under review by Hospice MD.  Writer spoke with pt's granddaughter Chastity by phone to initiate education related to hospice philosophy, services and team approach to care. She voiced understanding of information provided. Per discussion plan is for discharge home later today by [facility].  Please send signed and completed DNR form home with patient. Pt. Will need prescriptions for discharge comfort medications.  DME needs discussed and pt. Has a bed and a walker in the home. Family requests that O2 at [address] [place] be delivered to the home today. [facility] equipment manager Jewel Ysidro Evert notified and will contact [place] to arrange delivery to the home. Thre home address has been verified and Chastity is the contact to be called to arrange time of delivery.  [facility] aware of above. Please notify [facility] when pt. Is ready to leave unit at discharge- call [phone] ( or [phone] if after 5 pm). [facility] contact number given to Chastity during phone call. Above information shared with [facility]. Please call with any questions.  [facility] Liaison [phone]

## 2015-09-23 NOTE — Discharge Summary (Signed)
Physician Discharge Summary  [name] [id number] DOB: September 16, 1930 DOA: 09/19/2015  PCP: [name], MD  Admit date: 09/19/2015 Discharge date: 09/23/2015  Recommendations for Outpatient Follow-up:  Family wants to take pt home, respect wishes   Discharge Diagnoses:  Principal Problem:   Sepsis due to Pseudomonas, E. Coli UTI ([place]) in pt with chronic indwelling foley cath  Active Problems:   Acute encephalopathy   AKI (acute kidney injury) ([place])   Thrombocytosis (HCC)   Metabolic acidosis   Hyperkalemia   Paroxysmal atrial fibrillation (HCC)   Adenocarcinoma, colon ([place])   Encounter for palliative care   Goals of care, counseling/discussion   Dementia   H/O: CVA (cerebrovascular accident)- with R Hemiparesis/contractures   DNR (do not resuscitate)   Palliative care encounter   Discharge Condition: Calm  Diet recommendation: if able to, for comfort    Brief narrative:    Patient is 81 year old female with known history of CVA with right-sided hemiparesis, colon cancer status post colostomy bag placement, chronic indwelling catheter and chronic A. fib on no anticoagulation due to high risk of fall. Patient was brought to emergency department apparently from home after primary care physician evaluated the patient and recommended her to go to emergency Department. Please note that in emergency department, patient is confused, unable to provide history, no family at bedside present. Per emergency room doctor report, patient recently had urinary tract infection that was positive for Pseudomonas and sensitive to gentamicin. She was given gentamicin dose in emergency department.  In emergency department, patient is hemodynamically stable, vital signs notable for heart rate in 130s, blood work notable for WBC 14, platelets 463, creatinine 1.2. TRH asked to admit for further evaluation.   Major events since admission: 12/23 - admitted for sepsis for presumptive UTI and  ? LLL PNA, started on maxipime, PCT consulted  12/25 - urine culture with E. Coli multidrug resistance, changed ABX to Rocephin per sensitivity report 12/26 - more lethargic, worsening renal failure, transfer to SDU, PCCM consulted  12/27 - full comfort   Microbiology data: Urine culture 12/23 - E. Coli sensitivity to Rocephin  Blood cultures 12/23 - no growth in 3 days  Urine culture 12/26 - pending  Blood culture 12/26 - pending   Assessment/Plan:    Principal Problem:  Severe sepsis, septic shock due to Pseudomonas, E. Coli UTI ([place]) - given one dose of gentamycin in ED for pseudomonas UTI that was reported by her PCP - please note that pt was initially started on Maxipime but that was changed once we got urine culture back that was positive for E. Coli and with multidrug resistance - urine culture noted sensitivity to Rocephin and that was started on 12/25  - pt became more lethargic on 12/26 and with Tmax 102 F9 reported per nurse), lactic acid > 3, sepsis protocol re initiated and abx coverage broadened to vanc and Primaxin, was not able to give Zosyn as urine sensitivity report indicated resistance to Zosyn  - lactic acid acid progressively worsening despite medical measures and now c/w septic shock - d/w family at bedside, granddaughter says she knew pt was progressively declining and is ready to allow full comfort as pt previously expressed  - no further blood work to ensure comfort  - family wants to take pt home and will allow and respect family and pt's wishes   Active Problems:  Transaminitis - shock liver  - no further blood work, focus on comfort    ? Left lobe  PNA - was on maxipime since admission but CT chest done 12/25 not convincing for PNA - bilateral pleural effusions were noted but no clear opacity that is c/w PNA   Acute kidney failure with hyperkalemia and metabolic acidosis  - ? Gentamycin that was given in ED  - renal US with no acute  findings, U Na and U Cr pending  - no further blood work in order to ensure comfort    Acute encephalopathy - secondary to the above   UTI (urinary tract infection) due to urinary indwelling catheter ([place]) - ABX as noted above  - E. Coli with MDR and previously with pseudomonas    Thrombocytosis (HCC) - reactive    Paroxysmal atrial fibrillation (HCC) - CHADS2 4-5, not on AC due to high risk fall, progressive decline, dementia    Adenocarcinoma, colon (HCC)   Dementia - has been confused since admission  - full comfort care allowed at home    Recent C. Diff - completed oral vanc recently  - no diarrheas while hospitalized this time    H/O: CVA (cerebrovascular accident)- with R Hemiparesis/contractures - bed bound at home   Code Status: DNR Family Communication: family at bedside  Disposition Plan: full comfort, family wants to take home  IV access:  Peripheral IV  Procedures and diagnostic studies:   Dg Chest Port 1 View 09/19/2015 Left much worse than right airspace disease worrisome for pneumonia is or less likely asymmetric edema.   Ct Chest Wo Contrast 09/22/2015 Moderate bilateral pleural effusions. 2. Partial collapse of the lower lobes bilaterally without significant airspace consolidation to suggest pneumonia. 3. Mild cardiomegaly. 4. Extensive atherosclerotic disease including coronary artery disease. 5. 15 mm left thyroid nodule Consider further evaluation with thyroid ultrasound. If patient is clinically hyperthyroid, consider nuclear medicine thyroid uptake and scan.   US Renal 09/22/2015 Mild bilateral renal cortical thinning. No evidence of hydronephrosis.  Medical Consultants:  PCT PCCM   Other Consultants:  PT SLP - Dys III diet  Nutritionist   IAnti-Infectives:   Maxipime 12/23 --> 12/25 Rocephin 12/25 -> 12/26 Vancomycin 12/26 --> 12/27 Primaxin 12/26 --> 12/27     Discharge Exam: Filed Vitals:    09/23/15 0840 09/23/15 0849  BP: 81/51   Pulse: 103   Temp:  99.6 F (37.6 C)  Resp: 21    Filed Vitals:   09/23/15 0500 09/23/15 0600 09/23/15 0840 09/23/15 0849  BP: 94/37 50/32 81/51    Pulse:  88 103   Temp:    99.6 F (37.6 C)  TempSrc:    Rectal  Resp: 24 22 21    Weight:      SpO2:  97% 95%     General: Pt is calm, NAD  Discharge Instructions  Discharge Instructions    Diet - low sodium heart healthy    Complete by:  As directed      Increase activity slowly    Complete by:  As directed             Medication List    STOP taking these medications        acetaminophen 325 MG tablet  Commonly known as:  TYLENOL     ALEVE 220 MG Caps  Generic drug:  Naproxen Sodium     baclofen 10 MG tablet  Commonly known as:  LIORESAL     ciprofloxacin 250 MG tablet  Commonly known as:  CIPRO     gabapentin 100 MG capsule  Commonly known as:  NEURONTIN     lisinopril 2.5 MG tablet  Commonly known as:  PRINIVIL,ZESTRIL     nitroGLYCERIN 0.4 MG SL tablet  Commonly known as:  NITROSTAT     nortriptyline 25 MG capsule  Commonly known as:  PAMELOR     oxybutynin 10 MG 24 hr tablet  Commonly known as:  DITROPAN-XL     QUEtiapine 25 MG tablet  Commonly known as:  SEROQUEL     SLEEP PO     traMADol 50 MG tablet  Commonly known as:  ULTRAM     VH ESSENTIALS UTI RELIEF PO      TAKE these medications        morphine 20 MG/ML concentrated solution  Commonly known as:  ROXANOL  Take 1 mL (20 mg total) by mouth every 2 (two) hours as needed for moderate pain, severe pain or shortness of breath.           The results of significant diagnostics from this hospitalization (including imaging, microbiology, ancillary and laboratory) are listed below for reference.     Microbiology: Recent Results (from the past 240 hour(s))  Culture, blood (x 2)     Status: None (Preliminary result)   Collection Time: 09/19/15  3:20 PM  Result Value Ref Range Status    Specimen Description BLOOD LEFT ARM  Final   Special Requests IN PEDIATRIC BOTTLE [place]  Final   Culture   Final    NO GROWTH 4 DAYS Performed at [facility]    Report Status PENDING  Incomplete  Culture, blood (x 2)     Status: None (Preliminary result)   Collection Time: 09/19/15  3:25 PM  Result Value Ref Range Status   Specimen Description BLOOD LEFT HAND  Final   Special Requests IN PEDIATRIC BOTTLE 3CC  Final   Culture   Final    NO GROWTH 4 DAYS Performed at [facility]    Report Status PENDING  Incomplete  Urine culture     Status: None   Collection Time: 09/19/15  4:06 PM  Result Value Ref Range Status   Specimen Description URINE, CATHETERIZED  Final   Special Requests NONE  Final   Culture   Final    >=100,000 COLONIES/mL ESCHERICHIA COLI Performed at [facility]    Report Status 09/21/2015 FINAL  Final   Organism ID, Bacteria ESCHERICHIA COLI  Final      Susceptibility   Escherichia coli - MIC*    AMPICILLIN >=32 RESISTANT Resistant     CEFAZOLIN >=64 RESISTANT Resistant     CEFTRIAXONE <=1 SENSITIVE Sensitive     CIPROFLOXACIN >=4 RESISTANT Resistant     GENTAMICIN >=16 RESISTANT Resistant     IMIPENEM <=0.25 SENSITIVE Sensitive     NITROFURANTOIN 32 SENSITIVE Sensitive     TRIMETH/SULFA <=20 SENSITIVE Sensitive     AMPICILLIN/SULBACTAM >=32 RESISTANT Resistant     PIP/TAZO >=128 RESISTANT Resistant     * >=100,000 COLONIES/mL ESCHERICHIA COLI  Culture, blood (x 2)     Status: None (Preliminary result)   Collection Time: 09/22/15  2:05 PM  Result Value Ref Range Status   Specimen Description BLOOD LEFT HAND  Final   Special Requests IN PEDIATRIC BOTTLE 1CC  Final   Culture   Final    NO GROWTH < 24 HOURS Performed at [facility]    Report Status PENDING  Incomplete  Culture, blood (x 2)     Status: None (Preliminary result)  Collection Time: 09/22/15  2:12 PM  Result Value Ref Range Status   Specimen Description BLOOD  LEFT HAND  Final   Special Requests IN PEDIATRIC BOTTLE [place]  Final   Culture   Final    NO GROWTH < 24 HOURS Performed at [facility]    Report Status PENDING  Incomplete  MRSA PCR Screening     Status: Abnormal   Collection Time: 09/22/15  8:29 PM  Result Value Ref Range Status   MRSA by PCR INVALID RESULTS, SPECIMEN SENT FOR CULTURE (A) NEGATIVE Final    Comment: Gala Murdoch RN [phone] 09/22/15 A NAVARRO        The GeneXpert MRSA Assay (FDA approved for NASAL specimens only), is one component of a comprehensive MRSA colonization surveillance program. It is not intended to diagnose MRSA infection nor to guide or monitor treatment for MRSA infections.      Labs: Basic Metabolic Panel:  Recent Labs Lab 09/20/15 0522 09/21/15 0605 09/22/15 0537 09/22/15 1751 09/22/15 2220  NA 141 143 141 141 141  K 4.6 5.2* 5.9* 6.2* 6.3*  CL 113* 111 110 110 111  CO2 19* 21* 17* 17* 16*  GLUCOSE 219* 327* 314* 345* 307*  BUN 28* 52* 72* 83* 86*  CREATININE 1.28* 1.90* 2.42* 2.88* 2.95*  CALCIUM 8.3* 8.7* 8.5* 8.4* 8.0*   Liver Function Tests:  Recent Labs Lab 09/19/15 1055 09/22/15 1751  AST 20 2059*  ALT 10* 1270*  ALKPHOS 69 52  BILITOT 1.1 0.9  PROT 7.5 7.1  ALBUMIN 2.7* 2.8*   No results for input(s): LIPASE, AMYLASE in the last 168 hours.  Recent Labs Lab 09/20/15 1820 09/22/15 1751  AMMONIA 32 24   CBC:  Recent Labs Lab 09/19/15 1055 09/19/15 1525 09/20/15 0522 09/21/15 0605 09/22/15 0537 09/22/15 1429  WBC 14.0* 11.7* 9.6 10.9* 17.0* 13.4*  NEUTROABS 11.5* 9.3*  --   --   --  10.7*  HGB 10.3* 10.6* 10.4* 9.8* 11.2* 11.5*  HCT 34.3* 34.8* 34.3* 31.8* 35.3* 38.9  MCV 89.6 88.1 87.9 87.1 87.2 88.8  PLT 463* 326 371 462* 470* 474*   CBG:  Recent Labs Lab 09/22/15 1726 09/22/15 2133 09/22/15 2359  GLUCAP 283* 273* 290*     SIGNED: Time coordinating discharge: 30 minutes  MAGICK-MYERS, ISKRA, MD  [facility] 09/23/2015,  10:09 AM Pager [phone]  If 7PM-7AM, please contact night-coverage [email or web address] Password [email or web address]

## 2015-09-23 NOTE — Progress Notes (Signed)
Spoke with [name] and informed her that EMS would be transporting [name] at 1200 to her house and hospice would be calling her today to come out today per [facility].  [name] was happy that she would be with [name] Schwaderer at home during this process.  Irven [place], RN

## 2015-09-23 NOTE — Progress Notes (Signed)
CRITICAL VALUE ALERT  Critical value received:  K 6.3 and Lactic Acid 4.3  Date of notification:  09/22/15  Time of notification:  2322  Critical value read back: Yes  Nurse who received alert:  Gae Gallop  MD notified (1st page):  Forrest Moron  Time of first page:  2324  MD notified (2nd page):  Time of second page:  Responding MD:  Forrest Moron  Time MD responded:  2325

## 2015-09-23 NOTE — Progress Notes (Signed)
Daily Progress Note   Patient Name: [name]       Date: 09/23/2015 DOB: [birth year]  Age: 80 y.o. MRN#: [id number] Attending Physician: Theodis Blaze, MD Primary Care Physician: [name], MD Admit Date: 09/19/2015  Reason for Consultation/Follow-up: Establishing goals of care  Subjective:  -continued conversation by telephone  with Chastity/grand-daughter/main caregiver  to discuss diagnosis (specific to dementia/failure to thrive), prognosis, GOC, EOL wishes disposition and options and the changes and decline that occur overnight.  -questions and concerns addressed with daughter [name] at bedside   Natural trajectory and expectations at EOL were discussed.    I discussed with Chastity that her grand-mother's prognosis is likely hours to days; she wishes to take her home to die.  Will try to coordinate with hospice of [place] this transition.  Chastity understands the risk of death during transport.  Questions and concerns addressed. Family encouraged to call with questions or concerns.  PMT will continue to support holistically.   Length of Stay: 3 days  Current Medications: Scheduled Meds:  . scopolamine  1 patch Transdermal Q72H    Continuous Infusions: . sodium chloride 30 mL/hr at 09/23/15 0506    PRN Meds: acetaminophen, LORazepam, morphine injection  Physical Exam: Physical Exam  Constitutional: She appears ill.  + temporal muscle wasting, unresponsive  Cardiovascular: Tachycardia present.   Pulmonary/Chest: She has decreased breath sounds in the right lower field and the left lower field.  Abdominal: Soft. Normal appearance.  Musculoskeletal:       Right shoulder: She exhibits decreased range of motion and decreased strength.  Noted discoloration  of fingers and toes  Neurological: She is unresponsive. She displays atrophy.  Skin: Skin is warm and dry.                Vital Signs: BP 81/51 mmHg  Pulse 103  Temp(Src) 99.6 F (37.6 C) (Rectal)  Resp 21  Wt 77.565 kg (171 lb)  SpO2 95% SpO2: SpO2: 95 % O2 Device: O2 Device: Nasal Cannula O2 Flow Rate: O2 Flow Rate (L/min): 5 L/min  Intake/output summary:   Intake/Output Summary (Last 24 hours) at 09/23/15 0953 Last data filed at 09/23/15 0810  Gross per 24 hour  Intake 392.25 ml  Output     40 ml  Net 352.25 ml  LBM:   Baseline Weight: Weight: 77.565 kg (171 lb) Most recent weight: Weight: 77.565 kg (171 lb)       Palliative Assessment/Data: Flowsheet Rows        Most Recent Value   Intake Tab    Referral Department  Hospitalist   Unit at Time of Referral  Med/Surg Unit   Palliative Care Primary Diagnosis  Sepsis/Infectious Disease   Palliative Care Type  New Palliative care   Reason for referral  Clarify Goals of Care, Counsel Regarding Hospice   Date first seen by Palliative Care  09/21/15   Clinical Assessment    Palliative Performance Scale Score  30%   Pain Max last 24 hours  4   Pain Min Last 24 hours  3   Dyspnea Max Last 24 Hours  4   Dyspnea Min Last 24 hours  3   Psychosocial & Spiritual Assessment    Palliative Care Outcomes       Additional Data Reviewed: CBC    Component Value Date/Time   WBC 13.4* 09/22/2015 1429   RBC 4.38 09/22/2015 1429   RBC 4.07 06/17/2014 0633   HGB 11.5* 09/22/2015 1429   HCT 38.9 09/22/2015 1429   PLT 474* 09/22/2015 1429   MCV 88.8 09/22/2015 1429   MCH 26.3 09/22/2015 1429   MCHC 29.6* 09/22/2015 1429   RDW 16.6* 09/22/2015 1429   LYMPHSABS 1.6 09/22/2015 1429   MONOABS 1.0 09/22/2015 1429   EOSABS 0.0 09/22/2015 1429   BASOSABS 0.0 09/22/2015 1429    CMP     Component Value Date/Time   NA 141 09/22/2015 2220   K 6.3* 09/22/2015 2220   CL 111 09/22/2015 2220   CO2 16* 09/22/2015 2220   GLUCOSE  307* 09/22/2015 2220   BUN 86* 09/22/2015 2220   CREATININE 2.95* 09/22/2015 2220   CALCIUM 8.0* 09/22/2015 2220   PROT 7.1 09/22/2015 1751   ALBUMIN 2.8* 09/22/2015 1751   AST 2059* 09/22/2015 1751   ALT 1270* 09/22/2015 1751   ALKPHOS 52 09/22/2015 1751   BILITOT 0.9 09/22/2015 1751   GFRNONAA 14* 09/22/2015 2220   GFRAA 16* 09/22/2015 2220       Problem List:  Patient Active Problem List   Diagnosis Date Noted  . Metabolic acidosis 99991111  . Hyperkalemia 09/22/2015  . Palliative care encounter   . Encounter for palliative care   . Goals of care, counseling/discussion   . Sepsis due to Pseudomonas, E. Coli UTI ([place]) in pt with chronic indwelling foley cath  09/19/2015  . Thrombocytosis ([employer]) 09/19/2015  . AKI (acute kidney injury) ([place]) 05/07/2015  . Acute encephalopathy 02/16/2015  . Dementia 02/16/2015  . H/O: CVA (cerebrovascular accident)- with R Hemiparesis/contractures 02/16/2015  . DNR (do not resuscitate) 02/16/2015  . Adenocarcinoma, colon ([place]) 09/09/2013  . Paroxysmal atrial fibrillation (HCC) 09/07/2013     Palliative Care Assessment & Plan    Code Status:  DNR    Code Status Orders        Start     Ordered   09/19/15 1422  Do not attempt resuscitation (DNR)   Continuous    Question Answer Comment  In the event of cardiac or respiratory ARREST Do not call a "code blue"   In the event of cardiac or respiratory ARREST Do not perform Intubation, CPR, defibrillation or ACLS   In the event of cardiac or respiratory ARREST Use medication by any route, position, wound care, and other measures to relive pain and suffering.  May use oxygen, suction and manual treatment of airway obstruction as needed for comfort.      09/19/15 1421        Goals of Care/Additional Recommendations:   Full comfort   Desire for further Chaplaincy support:no  Psycho-social Needs: Education on Hospice and Grief/Bereavement Support    Palliative Prophylaxis:    Aspiration, Bowel Regimen, Delirium Protocol, Frequent Pain Assessment, Oral Care and Turn Reposition   Prognosis: Hours to days  . Discharge Planning: Home with hospice benefit for home death  Care plan was discussed with Dr Doyle Askew  Thank you for allowing the Palliative Medicine Team to assist in the care of this patient.   Time In: 1000 Time Out: 1035 Total Time 35 min Prolonged Time Billed  no         Knox Royalty, NP  09/23/2015, 9:53 AM  Please contact Palliative Medicine Team phone at [phone] for questions and concerns.

## 2015-09-24 LAB — MRSA CULTURE

## 2015-09-24 LAB — CULTURE, BLOOD (ROUTINE X 2)
CULTURE: NO GROWTH
Culture: NO GROWTH

## 2015-09-27 DIAGNOSIS — R109 Unspecified abdominal pain: Secondary | ICD-10-CM | POA: Insufficient documentation

## 2015-09-27 LAB — CULTURE, BLOOD (ROUTINE X 2)
Culture: NO GROWTH
Culture: NO GROWTH

## 2015-10-29 DEATH — deceased

## 2015-11-04 IMAGING — DX DG CHEST 1V PORT
1 series · 1 of 1 positions shown · non-contrast
Comparison: 01/21/2015.

CLINICAL DATA: Altered mental status.

EXAM:
PORTABLE CHEST - 1 VIEW

[chest ap]
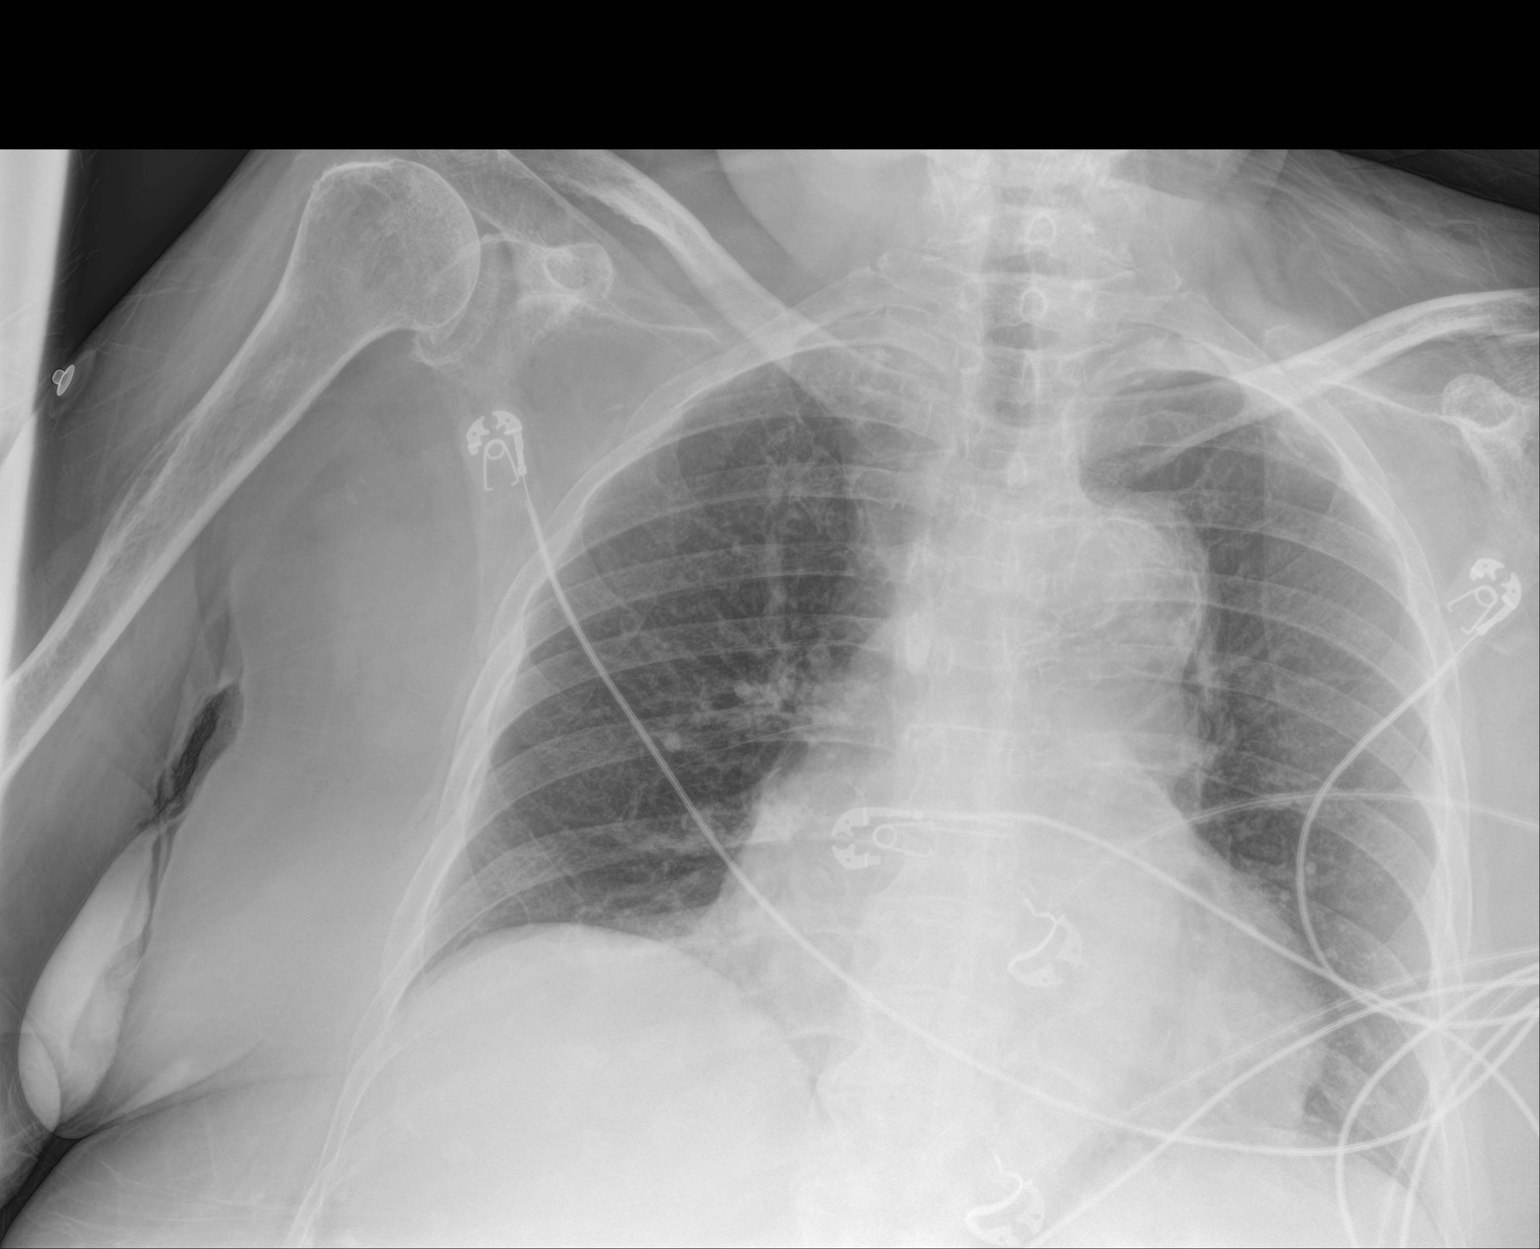

[1 of 1 positions shown; findings below may reference images not displayed]

FINDINGS: The cardiac silhouette remains borderline enlarged. Clear lungs.
Diffuse osteopenia. Lower thoracic spine degenerative changes.
IMPRESSION: No acute abnormality.

## 2015-11-04 IMAGING — CT CT HEAD W/O CM
3 of 4 series · 17 of 30 positions shown, 19 images · non-contrast
Comparison: 11/06/2010, 06/20/2014

CLINICAL DATA: Altered mental status

EXAM:
CT HEAD WITHOUT CONTRAST
TECHNIQUE: Contiguous axial images were obtained from the base of the skull
through the vertex without intravenous contrast.

[Series 2: head w/o · axial · non-contrast · 0.45mm/px · z∈[-120,-20]mm · 6 of 28 slices shown, 8 images (1 of 2)]
[im 4/28  brain]
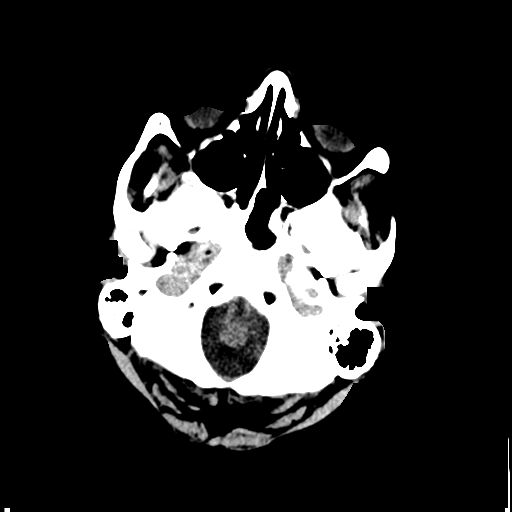
[im 4/28  bone]
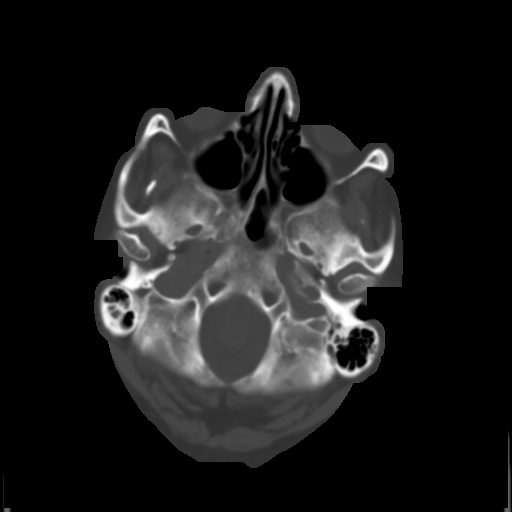
[im 8/28  brain]
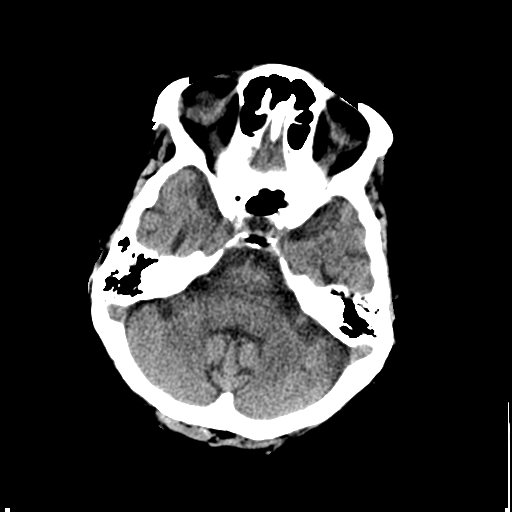
[im 12/28  brain]
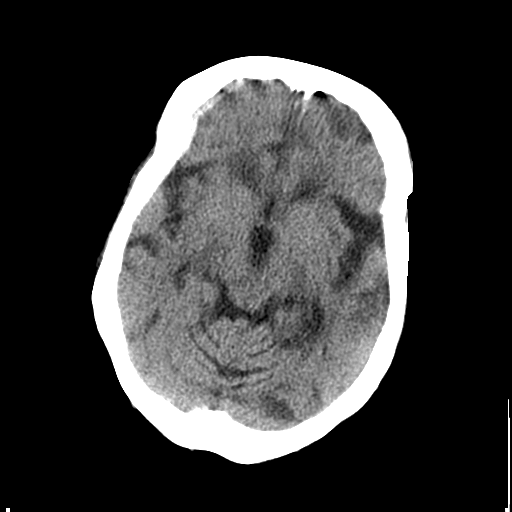
[im 16/28  brain]
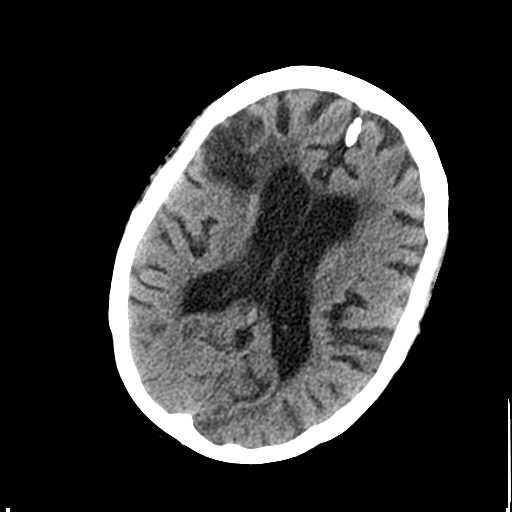
[im 20/28  brain]
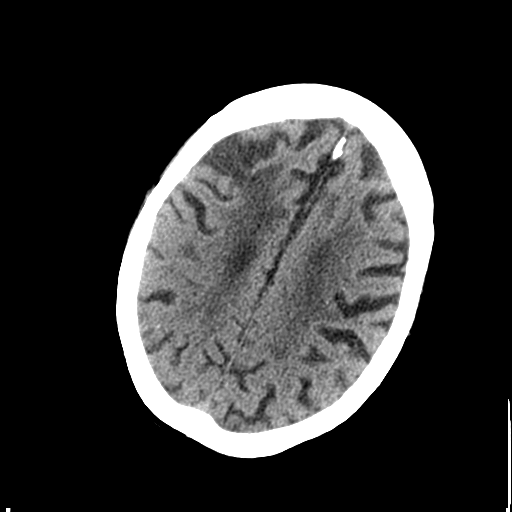
[im 20/28  bone]
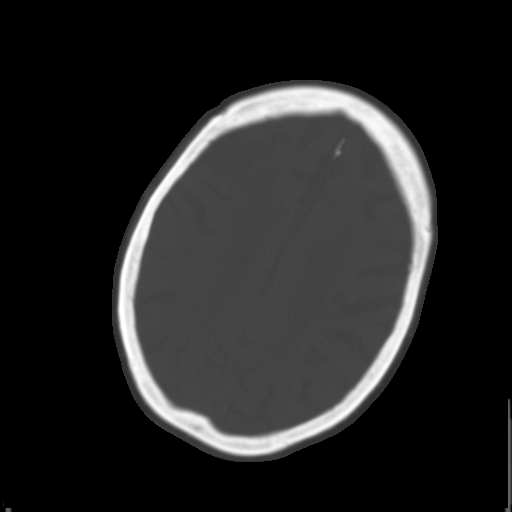
[im 24/28  brain]
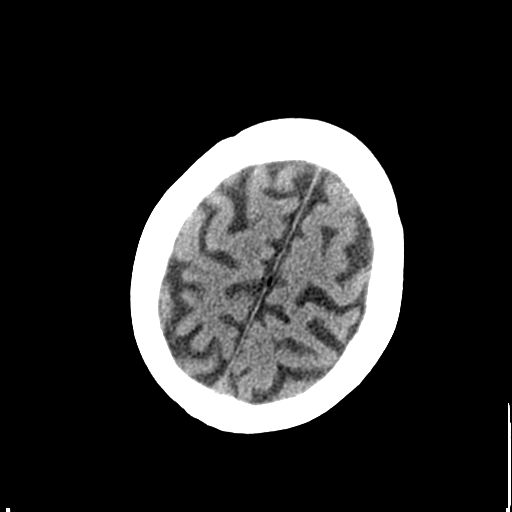

[Series 3: bone windows · axial · 0.45mm/px · z∈[-120,-20]mm · 6 of 28 slices shown]
[im 4/28  bone]
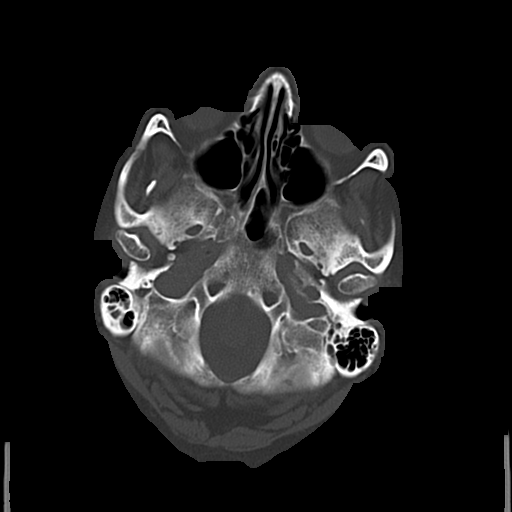
[im 8/28  bone]
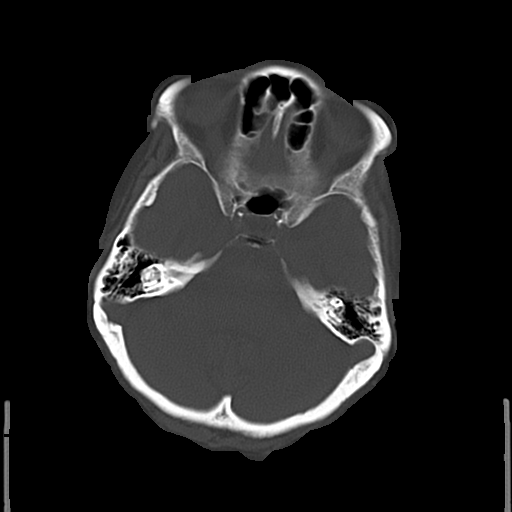
[im 12/28  bone]
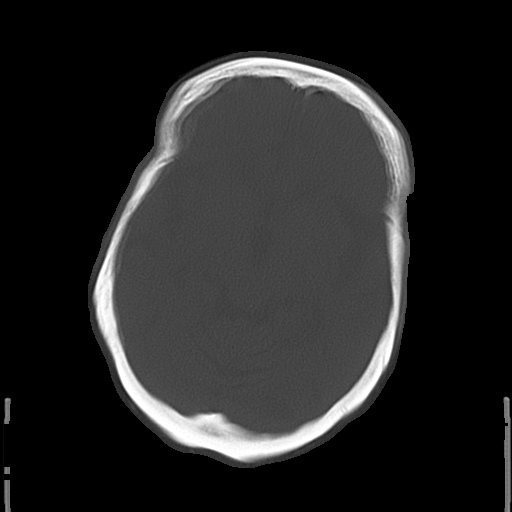
[im 16/28  bone]
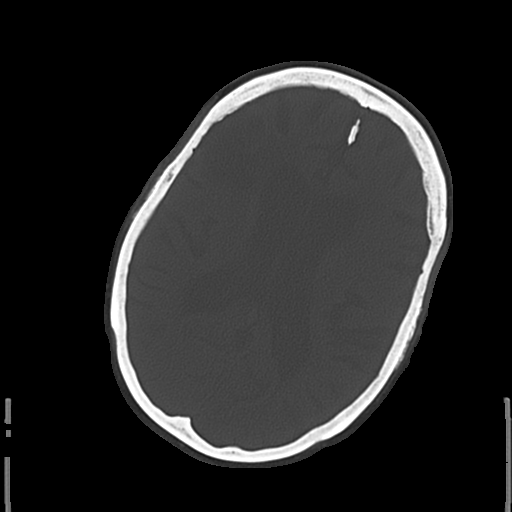
[im 20/28  bone]
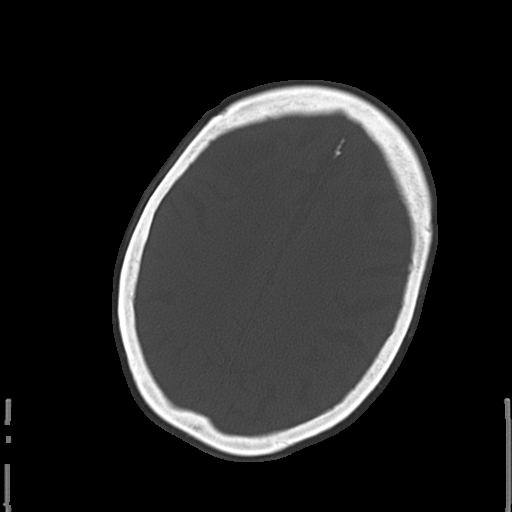
[im 24/28  bone]
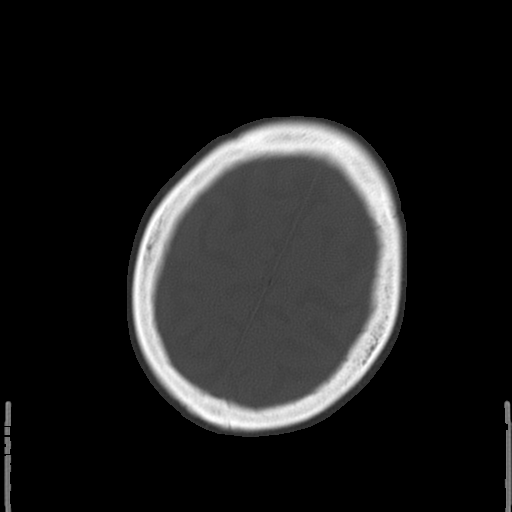

[Series 4: head w/o · axial · non-contrast · 0.45mm/px · z∈[-82,-2]mm · 5 of 24 slices shown (2 of 2)]
[im 4/24  brain]
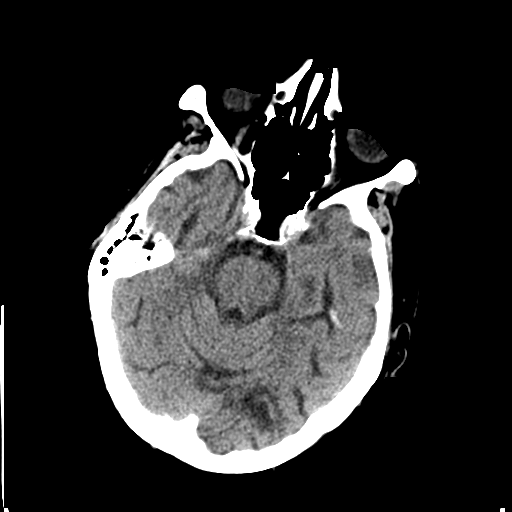
[im 8/24  brain]
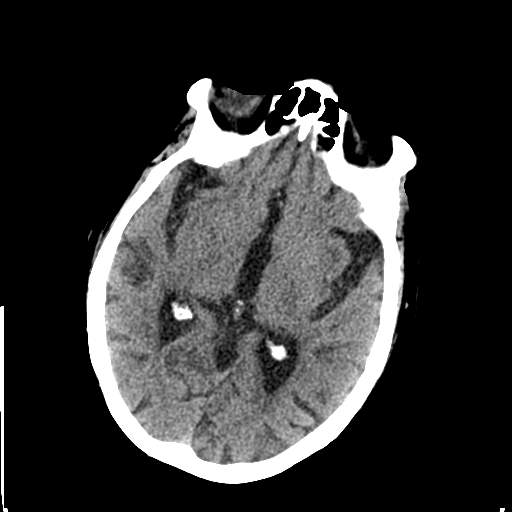
[im 12/24  brain]
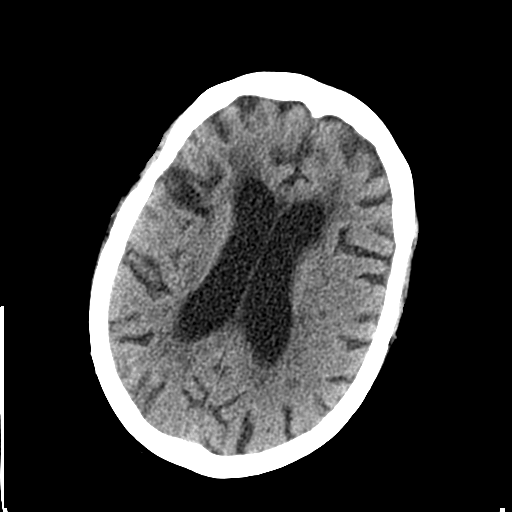
[im 16/24  brain]
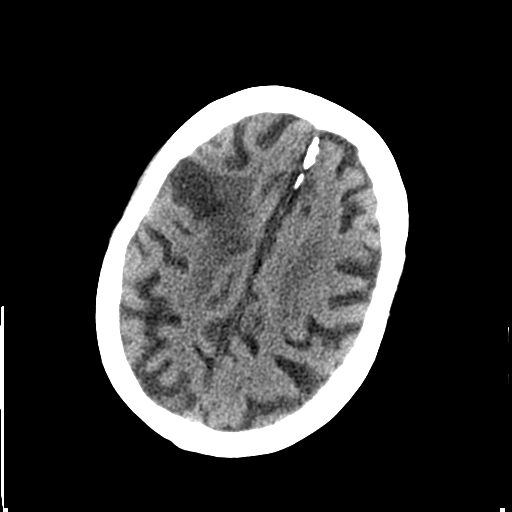
[im 20/24  brain]
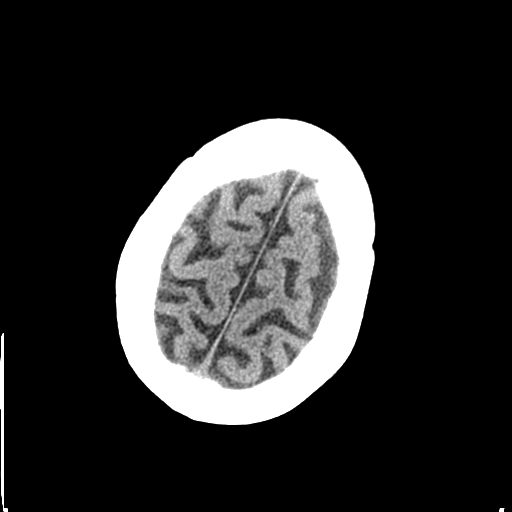

[17 of 30 positions shown; findings below may reference images not displayed]

FINDINGS: Bony calvarium is intact. No gross soft tissue abnormality is noted.
Diffuse atrophic changes are seen. No acute hemorrhage, acute
infarction or space-occupying mass lesion are. Changes of prior
right parietal lobe infarct and left occipital infarct are seen.
IMPRESSION: Chronic atrophic and ischemic changes. No acute abnormality is seen.
